# Patient Record
Sex: Male | Born: 1965
Health system: Northeastern US, Academic
[De-identification: ages and names within clinical notes are randomized; demographics above are authoritative.]

## PROBLEM LIST (undated history)

## (undated) DIAGNOSIS — T7840XA Allergy, unspecified, initial encounter: Secondary | ICD-10-CM

## (undated) HISTORY — PX: HERNIA REPAIR: SHX51

## (undated) HISTORY — DX: Allergy, unspecified, initial encounter: T78.40XA

---

## 2003-09-12 ENCOUNTER — Ambulatory Visit (HOSPITAL_COMMUNITY): Admission: RE | Admit: 2003-09-12 | Discharge: 2003-09-12 | Payer: Self-pay | Admitting: Surgery

## 2003-09-12 ENCOUNTER — Ambulatory Visit (HOSPITAL_BASED_OUTPATIENT_CLINIC_OR_DEPARTMENT_OTHER): Admission: RE | Admit: 2003-09-12 | Discharge: 2003-09-12 | Payer: Self-pay | Admitting: Surgery

## 2003-09-12 ENCOUNTER — Encounter (INDEPENDENT_AMBULATORY_CARE_PROVIDER_SITE_OTHER): Payer: Self-pay | Admitting: Specialist

## 2009-09-28 ENCOUNTER — Emergency Department (HOSPITAL_COMMUNITY): Admission: EM | Admit: 2009-09-28 | Discharge: 2009-09-28 | Payer: Self-pay | Admitting: Emergency Medicine

## 2010-09-26 NOTE — Op Note (Signed)
Thomas Santiago, Thomas Santiago                            ACCOUNT NO.:  [id number]   MEDICAL RECORD NO.:  [id number]                   PATIENT TYPE:  AMB   LOCATION:  [facility]                                 FACILITY:  [facility]   PHYSICIAN:  Thornton Park. Daphine Deutscher, M.D.             DATE OF BIRTH:  December 06, 1965   DATE OF PROCEDURE:  09/12/2003  DATE OF DISCHARGE:                                 OPERATIVE REPORT   PREOPERATIVE DIAGNOSIS:  Left inguinal hernia.   POSTOPERATIVE DIAGNOSIS:  Left indirect inguinal hernia.   PROCEDURE:  Left inguinal herniorrhaphy with mesh.   SURGEON:  Thornton Park. Daphine Deutscher, M.D.   ANESTHESIA:  General by LMA.   DESCRIPTION OF PROCEDURE:  Thomas Santiago is a 45 year old man, who had acute  pain and was seen by Dr. Robert Bellow at [facility] and was seen the  following day at our office and scheduled for a left inguinal hernia.  This  had gotten out, and he was reduced, but he had a lot of pain with it.  Informed consent was obtained in the office regarding repair of inguinal  hernia, including numbness, nerve pain, recurrent hernias.   The patient was taken to room 4 at [facility] and given general  anesthesia.  Received antibiotics preop.  The area was prepped with Betadine  and draped sterilely.  A small oblique incision was made in his left lower  quadrant.  He had a fairly generous amount of adiposity in his panniculus,  but I carried this down to the external oblique and incised those along the  fibers to the external ring.  I mobilized the cord structures and found a  large indirect hernia intimately associated with the cord structures along  with weakness of the floor.  I went ahead and separated this large sac from  the cord structures and went up high and excised the excess sac, and I  closed the sac with a running locking suture of 2-0 chromic.  In order to  completely mobilize this, I had to take down the fatty tissue that was stuck  to the inside of the sac,  and I did this and then tucked it all back inside.  No bleeding was noted, and there was no bowel impinged.  A running locking  suture was effected to close the indirect sac,and then this was tucked  inside.  I then repaired the floor with a piece of Marlex mesh, cut to fit,  sutured along the inguinal ligament with running 2-0 Prolene, sutured  medially with running 2-0 Prolene, and brought around the cord structures  and sutured to itself with a single 2-0 Prolene horizontal mattress suture.  The external oblique was then closed with a running 2-0 Vicryl, and  the area was infiltrated with 0.5% Marcaine.  Then 4-0 Vicryl was used in  the subcutaneous tissue, and then the skin was closed with subcuticular 4-0  Vicryl with Benzoin and Steri-Strips.  The patient seemed to tolerate the  procedure well and was taken to the recovery room in satisfactory condition.                                               Thornton Park Daphine Deutscher, M.D.    MBM/MEDQ  D:  09/12/2003  T:  09/12/2003  Job:  [id number]   cc:   Jonita Albee, M.D.  [facility]  [address]  [place]  [place] [address]  Fax: [phone]

## 2012-02-24 ENCOUNTER — Ambulatory Visit: Payer: Managed Care, Other (non HMO) | Admitting: Family Medicine

## 2012-02-24 VITALS — BP 139/89 | HR 62 | Temp 98.0°F | Resp 16 | Ht 67.0 in | Wt 191.0 lb

## 2012-02-24 DIAGNOSIS — Z23 Encounter for immunization: Secondary | ICD-10-CM

## 2012-02-24 DIAGNOSIS — Z0000 Encounter for general adult medical examination without abnormal findings: Secondary | ICD-10-CM

## 2012-02-24 MED ORDER — CIPROFLOXACIN HCL 250 MG PO TABS: 250.0000 mg | ORAL_TABLET | Freq: Two times a day (BID) | ORAL | Status: DC

## 2012-02-24 MED ORDER — INFLUENZA VIRUS VACC SPLIT PF IM SUSP: 0.5000 mL | INTRAMUSCULAR | Status: DC

## 2012-02-24 NOTE — Progress Notes (Signed)
Need flu vaccine  Also needs abx for trip to [place]

## 2012-02-24 NOTE — Patient Instructions (Signed)
Influenza Virus Trivalent Vaccine injection (Fluzone/FluLaval/Fluvirin/Agriflu) What is this medicine? INFLUENZA VIRUS VACCINE (in floo EN zuh VAHY ruhs vak SEEN) helps to reduce the risk of getting influenza also known as the flu. The vaccine only helps protect you against some strains of the flu. This medicine may be used for other purposes; ask your health care provider or pharmacist if you have questions. What should I tell my health care provider before I take this medicine? They need to know if you have any of these conditions: -bleeding disorder like hemophilia -fever or infection -Guillain-Barre syndrome or other neurological problems -immune system problems -infection with the human immunodeficiency virus (HIV) or AIDS -low blood platelet counts -multiple sclerosis -an unusual or allergic reaction to influenza virus vaccine, eggs, chicken proteins, thimerosal, other medicines, foods, dyes or preservatives -pregnant or trying to get pregnant -breast-feeding How should I use this medicine? This vaccine is for injection into a muscle or under the skin. It is given by a health care professional. A copy of Vaccine Information Statements will be given before each vaccination. Read this sheet carefully each time. The sheet may change frequently. Talk to your pediatrician regarding the use of this medicine in children. Special care may be needed. While some brands of this drug may be prescribed for children as young as 6 months of age for selected conditions, precautions do apply. Overdosage: If you think you have taken too much of this medicine contact a poison control center or emergency room at once. NOTE: This medicine is only for you. Do not share this medicine with others. What if I miss a dose? This does not apply. What may interact with this medicine? -chemotherapy or radiation therapy -medicines that lower your immune system like etanercept, anakinra, infliximab, and  adalimumab -medicines that treat or prevent blood clots like warfarin -phenytoin -steroid medicines like prednisone or cortisone -theophylline -vaccines This list may not describe all possible interactions. Give your health care provider a list of all the medicines, herbs, non-prescription drugs, or dietary supplements you use. Also tell them if you smoke, drink alcohol, or use illegal drugs. Some items may interact with your medicine. What should I watch for while using this medicine? Report any side effects that do not go away within 3 days to your doctor or health care professional. Call your health care provider if any unusual symptoms occur within 6 weeks of receiving this vaccine. You may still catch the flu, but the illness is not usually as bad. You cannot get the flu from the vaccine. The vaccine will not protect against colds or other illnesses that may cause fever. The vaccine is needed every year. What side effects may I notice from receiving this medicine? Side effects that you should report to your doctor or health care professional as soon as possible: -allergic reactions like skin rash, itching or hives, swelling of the face, lips, or tongue Side effects that usually do not require medical attention (report to your doctor or health care professional if they continue or are bothersome): -fever -headache -muscle aches and pains -pain, tenderness, redness, or swelling at the injection site -tiredness This list may not describe all possible side effects. Call your doctor for medical advice about side effects. You may report side effects to FDA at 1-800-FDA-1088. Where should I keep my medicine? The vaccine will be given by a health care professional in a clinic, pharmacy, doctor's office, or other health care setting. You will not be given vaccine doses to store   at home. NOTE: This sheet is a summary. It may not cover all possible information. If you have questions about this  medicine, talk to your doctor, pharmacist, or health care provider.  2012, Elsevier/Gold Standard. (09/25/2009 10:47:37 AM) 

## 2012-08-25 ENCOUNTER — Ambulatory Visit: Payer: Managed Care, Other (non HMO)

## 2012-12-14 ENCOUNTER — Ambulatory Visit (INDEPENDENT_AMBULATORY_CARE_PROVIDER_SITE_OTHER): Payer: Managed Care, Other (non HMO) | Admitting: Physician Assistant

## 2012-12-14 ENCOUNTER — Encounter: Payer: Self-pay | Admitting: Physician Assistant

## 2012-12-14 VITALS — BP 110/84 | HR 77 | Temp 98.7°F | Resp 16 | Ht 66.0 in | Wt 196.0 lb

## 2012-12-14 DIAGNOSIS — Z0000 Encounter for general adult medical examination without abnormal findings: Secondary | ICD-10-CM

## 2012-12-14 LAB — POCT URINALYSIS DIPSTICK
Ketones, UA: NEGATIVE
Leukocytes, UA: NEGATIVE
Urobilinogen, UA: 0.2
pH, UA: 6.5

## 2012-12-14 LAB — CBC
HCT: 43.4 % (ref 39.0–52.0)
MCV: 87.1 fL (ref 78.0–100.0)
RBC: 4.98 MIL/uL (ref 4.22–5.81)
RDW: 13.4 % (ref 11.5–15.5)
WBC: 6.1 10*3/uL (ref 4.0–10.5)

## 2012-12-14 LAB — COMPREHENSIVE METABOLIC PANEL
ALT: 25 U/L (ref 0–53)
Albumin: 4.3 g/dL (ref 3.5–5.2)
Alkaline Phosphatase: 61 U/L (ref 39–117)
Calcium: 9.6 mg/dL (ref 8.4–10.5)
Sodium: 141 mEq/L (ref 135–145)

## 2012-12-14 LAB — LIPID PANEL
Cholesterol: 199 mg/dL (ref 0–200)
HDL: 41 mg/dL (ref >39)
LDL Cholesterol: 131 mg/dL — ABNORMAL HIGH (ref 0–99)
Total CHOL/HDL Ratio: 4.9 Ratio
VLDL: 27 mg/dL (ref 0–40)

## 2012-12-14 NOTE — Progress Notes (Signed)
   [address] [place] [address]   Phone [phone]  Subjective:    Patient ID: Thomas Santiago, male    DOB: [birth year], 47 y.o.   MRN: [id number]  HPI Pt presents to clinic for CPE.  He is healthy.  He has intermittent pain in his L shoulder when he tries to reach at extreme ROM, like when he is trying to clean or scratch his back.  Pt is having trouble with reading close up. He last went to the eye doctor about 4 years ago. He does not go to the dentist.  Review of Systems  Constitutional: Negative.   HENT: Negative.   Eyes: Negative.   Respiratory: Negative.   Cardiovascular: Negative.   Gastrointestinal: Negative.   Endocrine: Negative.   Genitourinary: Negative.   Musculoskeletal: Negative.   Skin: Negative.   Allergic/Immunologic: Positive for environmental allergies (soring allergies).  Neurological: Negative.   Hematological: Negative.   Psychiatric/Behavioral: Negative.        Objective:   Physical Exam  Vitals reviewed. Constitutional: He is oriented to person, place, and time. He appears well-developed and well-nourished.  HENT:  Head: Normocephalic and atraumatic.  Right Ear: Hearing, tympanic membrane, external ear and ear canal normal.  Left Ear: Hearing, tympanic membrane, external ear and ear canal normal.  Nose: Mucosal edema (mild erythema) present.  Mouth/Throat: Uvula is midline, oropharynx is clear and moist and mucous membranes are normal.  Eyes: Conjunctivae and EOM are normal. Pupils are equal, round, and reactive to light.  Neck: Normal range of motion. Neck supple. No thyromegaly present.  Cardiovascular: Normal rate, regular rhythm and normal heart sounds.   No murmur heard. Pulmonary/Chest: Effort normal and breath sounds normal.  Abdominal: Soft. Bowel sounds are normal.  Musculoskeletal: Normal range of motion.  Muscle spasm in his trap on the L side.  Lymphadenopathy:    He has no cervical adenopathy.  Neurological: He is alert and  oriented to person, place, and time. He has normal reflexes.  Skin: Skin is warm and dry.  L hernia scar present.  Psychiatric: He has a normal mood and affect. His behavior is normal. Judgment and thought content normal.        Assessment & Plan:  PE (physical exam), annual - Plan: CBC, Comprehensive metabolic panel, Lipid panel, TSH, POCT urinalysis dipstick  Suggested to patient to schedule an eye exam for his presbyopia.  If all his labs are normal we could see him in 2 years.  Pt will work on weight loss and healthy lifestyle choices.    Benny Lennert PA-C 12/14/2012 1:26 PM

## 2012-12-15 LAB — TSH: TSH: 1.213 u[IU]/mL (ref 0.350–4.500)

## 2016-09-11 ENCOUNTER — Inpatient Hospital Stay (HOSPITAL_COMMUNITY)
Admission: EM | Admit: 2016-09-11 | Discharge: 2016-09-14 | DRG: 378 | Disposition: A | Discharge status: Home / Self Care | Payer: Managed Care, Other (non HMO) | Attending: Internal Medicine | Admitting: Internal Medicine

## 2016-09-11 ENCOUNTER — Ambulatory Visit (INDEPENDENT_AMBULATORY_CARE_PROVIDER_SITE_OTHER): Payer: Managed Care, Other (non HMO) | Admitting: Physician Assistant

## 2016-09-11 ENCOUNTER — Encounter (HOSPITAL_COMMUNITY): Payer: Self-pay | Admitting: Emergency Medicine

## 2016-09-11 VITALS — Temp 97.9°F | Resp 16

## 2016-09-11 DIAGNOSIS — K625 Hemorrhage of anus and rectum: Secondary | ICD-10-CM | POA: Diagnosis not present

## 2016-09-11 DIAGNOSIS — E669 Obesity, unspecified: Secondary | ICD-10-CM | POA: Diagnosis present

## 2016-09-11 DIAGNOSIS — Y929 Unspecified place or not applicable: Secondary | ICD-10-CM

## 2016-09-11 DIAGNOSIS — R42 Dizziness and giddiness: Secondary | ICD-10-CM

## 2016-09-11 DIAGNOSIS — Z9889 Other specified postprocedural states: Secondary | ICD-10-CM

## 2016-09-11 DIAGNOSIS — Z6832 Body mass index (BMI) 32.0-32.9, adult: Secondary | ICD-10-CM

## 2016-09-11 DIAGNOSIS — D62 Acute posthemorrhagic anemia: Secondary | ICD-10-CM | POA: Diagnosis present

## 2016-09-11 DIAGNOSIS — K254 Chronic or unspecified gastric ulcer with hemorrhage: Principal | ICD-10-CM | POA: Diagnosis present

## 2016-09-11 DIAGNOSIS — T39315A Adverse effect of propionic acid derivatives, initial encounter: Secondary | ICD-10-CM | POA: Diagnosis present

## 2016-09-11 DIAGNOSIS — K921 Melena: Secondary | ICD-10-CM

## 2016-09-11 DIAGNOSIS — D649 Anemia, unspecified: Secondary | ICD-10-CM

## 2016-09-11 DIAGNOSIS — Z79899 Other long term (current) drug therapy: Secondary | ICD-10-CM

## 2016-09-11 DIAGNOSIS — B9681 Helicobacter pylori [H. pylori] as the cause of diseases classified elsewhere: Secondary | ICD-10-CM | POA: Diagnosis present

## 2016-09-11 DIAGNOSIS — Z791 Long term (current) use of non-steroidal anti-inflammatories (NSAID): Secondary | ICD-10-CM

## 2016-09-11 DIAGNOSIS — K922 Gastrointestinal hemorrhage, unspecified: Secondary | ICD-10-CM

## 2016-09-11 LAB — CBC WITH DIFFERENTIAL/PLATELET
BASOS PCT: 0 %
Basophils Absolute: 0 10*3/uL (ref 0.0–0.1)
EOS ABS: 0.1 10*3/uL (ref 0.0–0.7)
EOS PCT: 1 %
HEMATOCRIT: 30.2 % — AB (ref 39.0–52.0)
Hemoglobin: 9.9 g/dL — ABNORMAL LOW (ref 13.0–17.0)
Lymphocytes Relative: 19 %
Lymphs Abs: 2.1 10*3/uL (ref 0.7–4.0)
MCH: 29.8 pg (ref 26.0–34.0)
MCHC: 32.8 g/dL (ref 30.0–36.0)
MCV: 91 fL (ref 78.0–100.0)
MONO ABS: 0.6 10*3/uL (ref 0.1–1.0)
MONOS PCT: 5 %
NEUTROS ABS: 8.2 10*3/uL — AB (ref 1.7–7.7)
Neutrophils Relative %: 75 %
PLATELETS: 220 10*3/uL (ref 150–400)
RBC: 3.32 MIL/uL — ABNORMAL LOW (ref 4.22–5.81)
RDW: 13 % (ref 11.5–15.5)
WBC: 11 10*3/uL — ABNORMAL HIGH (ref 4.0–10.5)

## 2016-09-11 LAB — POCT CBC
GRANULOCYTE PERCENT: 74.1 % (ref 37–80)
HEMATOCRIT: 29.5 % — AB (ref 43.5–53.7)
HEMOGLOBIN: 10.1 g/dL — AB (ref 14.1–18.1)
Lymph, poc: 2 (ref 0.6–3.4)
MCH: 30.3 pg (ref 27–31.2)
MCHC: 34 g/dL (ref 31.8–35.4)
MCV: 89.1 fL (ref 80–97)
MID (cbc): 0.5 (ref 0–0.9)
MPV: 7.7 fL (ref 0–99.8)
POC GRANULOCYTE: 7.3 — AB (ref 2–6.9)
POC LYMPH PERCENT: 20.4 %L (ref 10–50)
POC MID %: 5.5 % (ref 0–12)
Platelet Count, POC: 213 10*3/uL (ref 142–424)
RBC: 3.32 M/uL — AB (ref 4.69–6.13)
RDW, POC: 13.5 %
WBC: 9.9 10*3/uL (ref 4.6–10.2)

## 2016-09-11 LAB — POCT URINALYSIS DIP (MANUAL ENTRY)
BILIRUBIN UA: NEGATIVE
Blood, UA: NEGATIVE
GLUCOSE UA: NEGATIVE mg/dL
Ketones, POC UA: NEGATIVE mg/dL
Leukocytes, UA: NEGATIVE
NITRITE UA: NEGATIVE
Protein Ur, POC: NEGATIVE mg/dL
Spec Grav, UA: 1.02 (ref 1.010–1.025)
UROBILINOGEN UA: 0.2 U/dL
pH, UA: 5.5 (ref 5.0–8.0)

## 2016-09-11 LAB — COMPREHENSIVE METABOLIC PANEL
ALBUMIN: 3.4 g/dL — AB (ref 3.5–5.0)
ALT: 17 U/L (ref 17–63)
ANION GAP: 8 (ref 5–15)
AST: 19 U/L (ref 15–41)
Alkaline Phosphatase: 34 U/L — ABNORMAL LOW (ref 38–126)
BILIRUBIN TOTAL: 0.7 mg/dL (ref 0.3–1.2)
BUN: 26 mg/dL — ABNORMAL HIGH (ref 6–20)
CHLORIDE: 106 mmol/L (ref 101–111)
CO2: 24 mmol/L (ref 22–32)
Calcium: 8.3 mg/dL — ABNORMAL LOW (ref 8.9–10.3)
Creatinine, Ser: 0.64 mg/dL (ref 0.61–1.24)
GFR calc Af Amer: >60 mL/min (ref >60)
GFR calc non Af Amer: >60 mL/min (ref >60)
GLUCOSE: 120 mg/dL — AB (ref 65–99)
POTASSIUM: 4.3 mmol/L (ref 3.5–5.1)
Sodium: 138 mmol/L (ref 135–145)
TOTAL PROTEIN: 6 g/dL — AB (ref 6.5–8.1)

## 2016-09-11 LAB — FERRITIN: Ferritin: 155 ng/mL (ref 24–336)

## 2016-09-11 LAB — RETICULOCYTES
RBC.: 3.29 MIL/uL — AB (ref 4.22–5.81)
RETIC CT PCT: 2.9 % (ref 0.4–3.1)
Retic Count, Absolute: 95.4 10*3/uL (ref 19.0–186.0)

## 2016-09-11 LAB — PROTIME-INR
INR: 1.03
PROTHROMBIN TIME: 13.5 s (ref 11.4–15.2)

## 2016-09-11 LAB — IRON AND TIBC
Iron: 90 ug/dL (ref 45–182)
SATURATION RATIOS: 35 % (ref 17.9–39.5)
TIBC: 259 ug/dL (ref 250–450)
UIBC: 169 ug/dL

## 2016-09-11 LAB — POC OCCULT BLOOD, ED
Fecal Occult Bld: POSITIVE — AB
Fecal Occult Bld: POSITIVE — AB

## 2016-09-11 LAB — IFOBT (OCCULT BLOOD): IFOBT: POSITIVE

## 2016-09-11 LAB — ABO/RH: ABO/RH(D): O POS

## 2016-09-11 LAB — LIPASE, BLOOD: Lipase: 28 U/L (ref 11–51)

## 2016-09-11 LAB — FOLATE: Folate: 17.1 ng/mL (ref >5.9)

## 2016-09-11 LAB — VITAMIN B12: VITAMIN B 12: 389 pg/mL (ref 180–914)

## 2016-09-11 LAB — I-STAT CG4 LACTIC ACID, ED: LACTIC ACID, VENOUS: 1.86 mmol/L (ref 0.5–1.9)

## 2016-09-11 MED ORDER — ACETAMINOPHEN 325 MG PO TABS: 650.0000 mg | ORAL_TABLET | Freq: Four times a day (QID) | ORAL | Status: DC | PRN

## 2016-09-11 MED ORDER — SODIUM CHLORIDE 0.9 % IV SOLN
INTRAVENOUS | Status: DC
  Administered 2016-09-11 – 2016-09-13 (×5): via INTRAVENOUS

## 2016-09-11 MED ORDER — ONDANSETRON HCL 4 MG PO TABS: 4.0000 mg | ORAL_TABLET | Freq: Four times a day (QID) | ORAL | Status: DC | PRN

## 2016-09-11 MED ORDER — SODIUM CHLORIDE 0.9 % IV SOLN
8.0000 mg/h | INTRAVENOUS | Status: DC
  Administered 2016-09-11 – 2016-09-12 (×2): 8 mg/h via INTRAVENOUS
  Filled 2016-09-11 (×5): qty 80

## 2016-09-11 MED ORDER — SENNOSIDES-DOCUSATE SODIUM 8.6-50 MG PO TABS: 1.0000 | ORAL_TABLET | Freq: Every evening | ORAL | Status: DC | PRN

## 2016-09-11 MED ORDER — SODIUM CHLORIDE 0.9 % IV BOLUS (SEPSIS)
1000.0000 mL | Freq: Once | INTRAVENOUS | Status: AC
  Administered 2016-09-11: 1000 mL via INTRAVENOUS

## 2016-09-11 MED ORDER — LORATADINE 10 MG PO TABS
10.0000 mg | ORAL_TABLET | Freq: Every day | ORAL | Status: DC
  Administered 2016-09-12 – 2016-09-14 (×3): 10 mg via ORAL
  Filled 2016-09-11 (×2): qty 1

## 2016-09-11 MED ORDER — ONDANSETRON HCL 4 MG/2ML IJ SOLN
4.0000 mg | Freq: Four times a day (QID) | INTRAMUSCULAR | Status: DC | PRN
Start: 2016-09-11 — End: 2016-09-14

## 2016-09-11 MED ORDER — MECLIZINE HCL 12.5 MG PO TABS
12.5000 mg | ORAL_TABLET | Freq: Two times a day (BID) | ORAL | Status: DC | PRN
  Filled 2016-09-11: qty 1

## 2016-09-11 MED ORDER — TRAZODONE HCL 50 MG PO TABS: 25.0000 mg | ORAL_TABLET | Freq: Every evening | ORAL | Status: DC | PRN

## 2016-09-11 MED ORDER — ACETAMINOPHEN 650 MG RE SUPP: 650.0000 mg | Freq: Four times a day (QID) | RECTAL | Status: DC | PRN

## 2016-09-11 MED ORDER — BISACODYL 10 MG RE SUPP: 10.0000 mg | Freq: Every day | RECTAL | Status: DC | PRN

## 2016-09-11 NOTE — H&P (Signed)
History and Physical    Thomas Santiago WUJ:[id number] DOB: December 22, 1965 DOA: 09/11/2016   PCP: No PCP Per Patient   Patient coming from:  Home    Chief Complaint: Dizziness and dark stools  HPI: Thomas Santiago is a 51 y.o. [place] male with a history of allergies, otherwise healthy, presenting with 2 day history of dizziness worse with standing up, not at rest, without vertigo, accompanied by 5 day history of dark stools, 2 episodes a day, not worsening. Symptoms were preceded by "indigestion" last Sunday, after eating [employer]. He reports that he does take Advil daily for the last 3 weeks for "allergies". Denies abdominal pain, nausea, vomiting or diarrhea.  No prior similar episodes, Denies any sick contacts. Denies any tobacco, ETOH or recreational drugs. Denies history of Hepatitis or HIV. Denies fevers, chills, night sweats, vision changes, or mucositis. Denies any respiratory complaints. Denies any chest pain or palpitations. Denies lower extremity swelling. Denies any dysuria. Denies abnormal skin rashes, or neuropathy. Denies any bleeding issues such as epistaxis, hematemesis, hematuria or hematochezia. Denies Pica. He never had EGD . Last colonoscopy in 2015 negative. No recent long distant travels. No family history of GI cancers. No recent activity changes or trauma.,   ED Course:  BP (!) 124/94   Pulse 97   Temp 98.3 F (36.8 C) (Oral)   Resp 14   SpO2 95%    hemoglobin 9.9 (10.1 today )Was 14.7 in 2014  BUN 26 WBC 9->11 Lipase 28  PT INR heme occult + lactic acid 1.86 received Protonix IV, one little of IV bolus normal saline  GI to consult today.   Review of Systems: As per HPI otherwise 10 point review of systems negative.   Past Medical History:  Diagnosis Date  . Allergy     Past Surgical History:  Procedure Laterality Date  . HERNIA REPAIR     L side 2005    Social History Social History   Social History  . Marital status: Single    Spouse name: N/A    . Number of children: N/A  . Years of education: N/A   Occupational History  . Event organiser    Social History Main Topics  . Smoking status: Never Smoker  . Smokeless tobacco: Never Used  . Alcohol use No  . Drug use: No  . Sexual activity: Yes    Partners: Female   Other Topics Concern  . Not on file   Social History Narrative   Married with 2 children.  From [place] - [place] since 1998     No Known Allergies  No family history on file.    Prior to Admission medications   Medication Sig Start Date End Date Taking? Authorizing Provider  fexofenadine (ALLEGRA) 30 MG tablet Take 30 mg by mouth 2 (two) times daily.    Historical Provider, MD    Physical Exam:  Vitals:   09/11/16 1315 09/11/16 1345 09/11/16 1400 09/11/16 1415  BP: 138/80 118/73 120/77 (!) 124/94  Pulse: (!) 102 94 91 97  Resp: 16 16 13 14   Temp:      TempSrc:      SpO2: 100% 99% 98% 95%   Constitutional: NAD, calm, comfortable   Eyes: PERRL, lids and conjunctivae normal ENMT: Mucous membranes are moist, without exudate or lesions  Neck: normal, supple, no masses, no thyromegaly Respiratory: clear to auscultation bilaterally, no wheezing, no crackles. Normal respiratory effort  Cardiovascular: Regular rate and rhythm, no murmurs, rubs or  gallops. No extremity edema. 2+ pedal pulses. No carotid bruits.  Abdomen: Soft, non tender, No hepatosplenomegaly. Bowel sounds positive.  Musculoskeletal: no clubbing / cyanosis. Moves all extremities Skin: no jaundice, No lesions.  Neurologic: Sensation intact  Strength equal in all extremities Psychiatric:   Alert and oriented x 3. Normal mood.     Labs on Admission: I have personally reviewed following labs and imaging studies  CBC:  Recent Labs Lab 09/11/16 1006 09/11/16 1152  WBC 9.9 11.0*  NEUTROABS  --  8.2*  HGB 10.1* 9.9*  HCT 29.5* 30.2*  MCV 89.1 91.0  PLT  --  220    Basic Metabolic Panel:  Recent Labs Lab 09/11/16 1152  NA  138  K 4.3  CL 106  CO2 24  GLUCOSE 120*  BUN 26*  CREATININE 0.64  CALCIUM 8.3*    GFR: CrCl cannot be calculated (Unknown ideal weight.).  Liver Function Tests:  Recent Labs Lab 09/11/16 1152  AST 19  ALT 17  ALKPHOS 34*  BILITOT 0.7  PROT 6.0*  ALBUMIN 3.4*    Recent Labs Lab 09/11/16 1152  LIPASE 28   No results for input(s): AMMONIA in the last 168 hours.  Coagulation Profile:  Recent Labs Lab 09/11/16 1152  INR 1.03    Cardiac Enzymes: No results for input(s): CKTOTAL, CKMB, CKMBINDEX, TROPONINI in the last 168 hours.  BNP (last 3 results) No results for input(s): PROBNP in the last 8760 hours.  HbA1C: No results for input(s): HGBA1C in the last 72 hours.  CBG: No results for input(s): GLUCAP in the last 168 hours.  Lipid Profile: No results for input(s): CHOL, HDL, LDLCALC, TRIG, CHOLHDL, LDLDIRECT in the last 72 hours.  Thyroid Function Tests: No results for input(s): TSH, T4TOTAL, FREET4, T3FREE, THYROIDAB in the last 72 hours.  Anemia Panel: No results for input(s): VITAMINB12, FOLATE, FERRITIN, TIBC, IRON, RETICCTPCT in the last 72 hours.  Urine analysis:    Component Value Date/Time   BILIRUBINUR negative 09/11/2016 1029   BILIRUBINUR neg 12/14/2012 1607   KETONESUR negative 09/11/2016 1029   PROTEINUR negative 09/11/2016 1029   PROTEINUR neg 12/14/2012 1607   UROBILINOGEN 0.2 09/11/2016 1029   NITRITE Negative 09/11/2016 1029   NITRITE neg 12/14/2012 1607   LEUKOCYTESUR Negative 09/11/2016 1029    Sepsis Labs: @LABRCNTIP (procalcitonin:4,lacticidven:4) )No results found for this or any previous visit (from the past 240 hour(s)).   Radiological Exams on Admission: No results found.  EKG: Independently reviewed.  Assessment/Plan Active Problems:   * No active hospital problems. *    Gastro Intestinal Bleed  Likely higher  Etiology is unknown at this time, NSAIDs versus food related. Never had EGD. No fam hx of GI  cancers. IV Protonix  And IVF given at the ED.  current Hgb is 10->9  BUN is 26 Place on  observation medsurg    IV Protonix as per GI  IVF at 100 cc/h  NPO for now Dr. Dulce Sellar [facility] has been consulted. Hold NSAIDs Check H pylori   Dizziness in the setting of GIB, symptomatic anemia, likely orthostatic : Hemodynamically stable at this time. No vertigo noted. No Neuro changes. Denies CP or palpitations. EKG NSR Received 1 L IV NS.   - Telemetry, observation - Meclizine when necessary dizziness - IVF  - Neurology consult if not improving in a.m. - orthostatics  Anemia, symptomatic, likely due to GIB current Hgb is 10->9  BUN is 26. Hcult + MCV normal at 91 Will type  and screen, check serial CBCs, and transfuse for Hgb less than 8 .  Repeat CBC in am     DVT prophylaxis: SCD's   Code Status:   Full    Family Communication:  Discussed with patient Disposition Plan: Expect patient to be discharged to home after condition improves Consults called:   GI   Admission status: Medsurg Obs  Wolf Boulay E, PA-C [facility]   09/11/2016, 2:31 PM

## 2016-09-11 NOTE — ED Provider Notes (Signed)
[facility]-EMERGENCY DEPT Provider Note   CSN: [id number] Arrival date & time: 09/11/16  1123     History   Chief Complaint Chief Complaint  Patient presents with  . Blood In Stools    HPI Thomas Santiago is a 51 y.o. male.  HPI Patient presents with concern of lightheadedness, black stool. Patient states that he was generally well until a few days ago. About that time he noticed black stool, since then has had the same. He denies bright red blood per rectum per He denies diarrhea, vomiting. During this illness he has had increasing generalized weakness, as well as new lightheadedness, particularly with upright positioning and exertion.  He denies recent medication changes, activity changes, does recall one prior possible food precipitant.  Past Medical History:  Diagnosis Date  . Allergy     There are no active problems to display for this patient.   Past Surgical History:  Procedure Laterality Date  . HERNIA REPAIR     L side 2005       Home Medications    Prior to Admission medications   Medication Sig Start Date End Date Taking? Authorizing Provider  fexofenadine (ALLEGRA) 30 MG tablet Take 30 mg by mouth 2 (two) times daily.    Historical Provider, MD    Family History No family history on file.  Social History Social History  Substance Use Topics  . Smoking status: Never Smoker  . Smokeless tobacco: Never Used  . Alcohol use No     Allergies   Patient has no known allergies.   Review of Systems Review of Systems  Constitutional:       Per HPI, otherwise negative  HENT:       Per HPI, otherwise negative  Respiratory:       Per HPI, otherwise negative  Cardiovascular:       Per HPI, otherwise negative  Gastrointestinal: Positive for nausea. Negative for vomiting.  Endocrine:       Negative aside from HPI  Genitourinary:       Neg aside from HPI   Musculoskeletal:       Per HPI, otherwise negative  Skin: Negative.   Neurological: Positive  for light-headedness. Negative for syncope.     Physical Exam Updated Vital Signs BP 129/81   Pulse (!) 102   Temp 98.3 F (36.8 C) (Oral)   Resp 19   SpO2 100%   Physical Exam  Constitutional: He is oriented to person, place, and time. He appears well-developed. No distress.  HENT:  Head: Normocephalic and atraumatic.  Eyes: Conjunctivae and EOM are normal.  Cardiovascular: Normal rate and regular rhythm.   Pulmonary/Chest: Effort normal. No stridor. No respiratory distress.  Abdominal: He exhibits no distension.  Genitourinary: Rectal exam shows guaiac positive stool. Rectal exam shows no tenderness.  Musculoskeletal: He exhibits no edema.  Neurological: He is alert and oriented to person, place, and time.  Skin: Skin is warm and dry.  Psychiatric: He has a normal mood and affect.  Nursing note and vitals reviewed.    ED Treatments / Results  Labs (all labs ordered are listed, but only abnormal results are displayed) Labs Reviewed  CBC WITH DIFFERENTIAL/PLATELET - Abnormal; Notable for the following:       Result Value   WBC 11.0 (*)    RBC 3.32 (*)    Hemoglobin 9.9 (*)    HCT 30.2 (*)    Neutro Abs 8.2 (*)    All other components within normal  limits  COMPREHENSIVE METABOLIC PANEL - Abnormal; Notable for the following:    Glucose, Bld 120 (*)    BUN 26 (*)    Calcium 8.3 (*)    Total Protein 6.0 (*)    Albumin 3.4 (*)    Alkaline Phosphatase 34 (*)    All other components within normal limits  POC OCCULT BLOOD, ED - Abnormal; Notable for the following:    Fecal Occult Bld POSITIVE (*)    All other components within normal limits  POC OCCULT BLOOD, ED - Abnormal; Notable for the following:    Fecal Occult Bld POSITIVE (*)    All other components within normal limits  LIPASE, BLOOD  PROTIME-INR  OCCULT BLOOD X 1 CARD TO LAB, STOOL  I-STAT CG4 LACTIC ACID, ED  TYPE AND SCREEN  ABO/RH   Labs notable for substantial hemoglobin drop.  He is also hemoccult  positive on stool exam.   Procedures Procedures (including critical care time)  Medications Ordered in ED Medications  pantoprazole (PROTONIX) 80 mg in sodium chloride 0.9 % 250 mL (0.32 mg/mL) infusion (not administered)  sodium chloride 0.9 % bolus 1,000 mL (not administered)    And  0.9 %  sodium chloride infusion (not administered)     Initial Impression / Assessment and Plan / ED Course  I have reviewed the triage vital signs and the nursing notes.  Pertinent labs & imaging results that were available during my care of the patient were reviewed by me and considered in my medical decision making (see chart for details).  2:39 PM Patient awake and alert. Patient has received fluid resuscitation, IV Protonix, blood pressure is stable. I have discussed patient's case with our gastroenterology colleagues for consultation given the patient's active GI bleed, substantial drop in hemoglobin.  I also discussed patient's case with our hospitalist colleagues for admission. This patient presents with concern of black stool, weakness, lightheadedness, is found to have Hemoccult-positive feces, as well as substantial drop in his hemoglobin, concerning for ongoing GI bleed. Patient received fluid resuscitation with improvement in his heart rate, had no additional episodes of black stool in the emergency department. With his concerning findings and discuss case with our GI colleagues, who will evaluate the patient. Patient requires admission for further evaluation and management.    Final Clinical Impressions(s) / ED Diagnoses  GI bleed Symptomatic anemia   Gerhard Munchobert Darice Vicario, MD 09/11/16 1450

## 2016-09-11 NOTE — Consult Note (Signed)
[facility] Consultation Note  Referring Provider:  Dr. Brett Canalesheresa Sheehan [facility] Primary Care Physician:  Patient, No Pcp Per  Reason for Consultation:  Melena, anemia  HPI: Thomas Santiago is a 51 y.o. male presenting with melena.  History of allergies and sinus headaches, over the past few weeks been taking couple Advil liqui-gels per day.  Few day history of melena.  No abdominal pain, hematemesis, hematochezia.  No prior GI bleeding.  No prior endoscopy or colonoscopy.  Some weakness and fatigue.  No weight loss.   Past Medical History:  Diagnosis Date  . Allergy     Past Surgical History:  Procedure Laterality Date  . HERNIA REPAIR     L side 2005    Prior to Admission medications   Medication Sig Start Date End Date Taking? Authorizing Provider  fexofenadine (ALLEGRA) 30 MG tablet Take 30 mg by mouth 2 (two) times daily.   Yes [provider]    Current Facility-Administered Medications  Medication Dose Route Frequency Provider Last Rate Last Dose  . 0.9 %  sodium chloride infusion   Intravenous Continuous Marcos EkeWertman, Sara E, PA-C      . acetaminophen (TYLENOL) tablet 650 mg  650 mg Oral Q6H PRN Marcos EkeWertman, Sara E, PA-C       Or  . acetaminophen (TYLENOL) suppository 650 mg  650 mg Rectal Q6H PRN Marcos EkeWertman, Sara E, PA-C      . bisacodyl (DULCOLAX) suppository 10 mg  10 mg Rectal Daily PRN Marcos EkeWertman, Sara E, PA-C      . [START ON 09/12/2016] loratadine (CLARITIN) tablet 10 mg  10 mg Oral Daily Wertman, Sara E, PA-C      . meclizine (ANTIVERT) tablet 12.5 mg  12.5 mg Oral BID PRN Marcos EkeWertman, Sara E, PA-C      . ondansetron (ZOFRAN) tablet 4 mg  4 mg Oral Q6H PRN Marcos EkeWertman, Sara E, PA-C       Or  . ondansetron (ZOFRAN) injection 4 mg  4 mg Intravenous Q6H PRN Marcos EkeWertman, Sara E, PA-C      . pantoprazole (PROTONIX) 80 mg in sodium chloride 0.9 % 250 mL (0.32 mg/mL) infusion  8 mg/hr Intravenous Continuous Gerhard MunchLockwood, Robert, MD 25 mL/hr at 09/11/16 1251 8 mg/hr at 09/11/16 1251  .  senna-docusate (Senokot-S) tablet 1 tablet  1 tablet Oral QHS PRN Marcos EkeWertman, Sara E, PA-C        Allergies as of 09/11/2016  . (No Known Allergies)    History reviewed. No pertinent family history.  Social History   Social History  . Marital status: Single    Spouse name: N/A  . Number of children: N/A  . Years of education: N/A   Occupational History  . Event organiserautomatic mechanic    Social History Main Topics  . Smoking status: Never Smoker  . Smokeless tobacco: Never Used  . Alcohol use No  . Drug use: No  . Sexual activity: Yes    Partners: Female   Other Topics Concern  . Not on file   Social History Narrative   Married with 2 children.  From [place]    Review of Systems: Positive = bold Gen: Denies any fever, chills, rigors, night sweats, anorexia, fatigue, weakness, malaise, involuntary weight loss, and sleep disorder CV: Denies chest pain, angina, palpitations, syncope, orthopnea, PND, peripheral edema, and claudication. Resp: Denies dyspnea, cough, sputum, wheezing, coughing up blood. GI: Described in detail in HPI.    GU : Denies urinary burning, blood  in urine, urinary frequency, urinary hesitancy, nocturnal urination, and urinary incontinence. MS: Denies joint pain or swelling.  Denies muscle weakness, cramps, atrophy.  Derm: Denies rash, itching, oral ulcerations, hives, unhealing ulcers.  Psych: Denies depression, anxiety, memory loss, suicidal ideation, hallucinations,  and confusion. Heme: Denies bruising, bleeding, and enlarged lymph nodes. Neuro:  Denies any headaches, dizziness, paresthesias. Endo:  Denies any problems with DM, thyroid, adrenal function.  Physical Exam: Vital signs in last 24 hours: Temp:  [97.9 F (36.6 C)-98.3 F (36.8 C)] 98.3 F (36.8 C) (05/04 1132) Pulse Rate:  [91-107] 99 (05/04 1515) Resp:  [13-27] 14 (05/04 1515) BP: (111-151)/(66-103) 125/83 (05/04 1515) SpO2:  [95 %-100 %] 99 % (05/04 1515)   General:    Alert,  Well-developed, well-nourished, pleasant and cooperative in NAD Head:  Normocephalic and atraumatic. Eyes:  Sclera clear, no icterus.   Conjunctiva pale Ears:  Normal auditory acuity. Nose:  No deformity, discharge,  or lesions. Mouth:  No deformity or lesions.  Oropharynx pale and dry. Neck:  Supple; no masses or thyromegaly. Lungs:  Clear throughout to auscultation.   No wheezes, crackles, or rhonchi. No acute distress. Heart:  Regular rate and rhythm; no murmurs, clicks, rubs,  or gallops. Abdomen:  Soft, nontender and nondistended. No masses, hepatosplenomegaly or hernias noted. Normal bowel sounds, without guarding, and without rebound.     Msk:  Symmetrical without gross deformities. Normal posture. Pulses:  Normal pulses noted. Extremities:  Without clubbing or edema. Neurologic:  Alert and  oriented x4; diffusely weak, otherwise grossly normal neurologically. Skin:  Intact without significant lesions or rashes. Psych:  Alert and cooperative. Normal mood and affect.   Lab Results:  Recent Labs  09/11/16 1006 09/11/16 1152  WBC 9.9 11.0*  HGB 10.1* 9.9*  HCT 29.5* 30.2*  PLT  --  220   BMET  Recent Labs  09/11/16 1152  NA 138  K 4.3  CL 106  CO2 24  GLUCOSE 120*  BUN 26*  CREATININE 0.64  CALCIUM 8.3*   LFT  Recent Labs  09/11/16 1152  PROT 6.0*  ALBUMIN 3.4*  AST 19  ALT 17  ALKPHOS 34*  BILITOT 0.7   PT/INR  Recent Labs  09/11/16 1152  LABPROT 13.5  INR 1.03    Studies/Results: No results found.  Impression:  1.  Melena with elevated BUN.  NSAIDs.  Suspect peptic ulcer.  Hemodynamically stable. 2.  Acute blood loss anemia.  Plan:  1.  Clear liquid diet tonight. 2.  NPO after midnight. 3.  PPI. 4.  Endoscopy tomorrow. 5.  Risks (bleeding, infection, bowel perforation that could require surgery, sedation-related changes in cardiopulmonary systems), benefits (identification and possible treatment of source of symptoms,  exclusion of certain causes of symptoms), and alternatives (watchful waiting, radiographic imaging studies, empiric medical treatment) of upper endoscopy (EGD) were explained to patient/family in detail and patient wishes to proceed.   LOS: 0 days   Denisa Enterline M  09/11/2016, 4:43 PM  Pager [phone] If no answer or after 5 PM call [phone]

## 2016-09-11 NOTE — Patient Instructions (Signed)
     IF you received an x-ray today, you will receive an invoice from [facility]. Please contact [facility] at [phone] with questions or concerns regarding your invoice.   IF you received labwork today, you will receive an invoice from [facility]. Please contact [facility] at [phone] with questions or concerns regarding your invoice.   Our billing staff will not be able to assist you with questions regarding bills from these companies.  You will be contacted with the lab results as soon as they are available. The fastest way to get your results is to activate your My Chart account. Instructions are located on the last page of this paperwork. If you have not heard from us regarding the results in 2 weeks, please contact this office.     

## 2016-09-11 NOTE — Progress Notes (Signed)
09/11/2016 11:32 AM   DOB: [birth year] / MRN: [id number]  SUBJECTIVE:  Thomas Santiago is a 51 y.o. male presenting for dizziness that started 2 days ago.  Tells me this started with going from kneeling to standing while working on his car.  Denies loss of consciences, room spinning, chest pain, SOB.  Tells me he is urinating normally. Tells me he has noticed black stools for the past two days. Has been taking 2 Advil daily for the last 3 weeks.  Denies abdominal pain and nausea at this time.  He drinks on the weekend, roughly 1-2 beers and not more.   He has No Known Allergies.   He  has a past medical history of Allergy.    He  reports that he has never smoked. He has never used smokeless tobacco. He reports that he does not drink alcohol or use drugs. He  reports that he currently engages in sexual activity and has had male partners. The patient  has a past surgical history that includes Hernia repair.  His family history is not on file.  Review of Systems  Constitutional: Negative for fever.  Respiratory: Negative for shortness of breath.   Cardiovascular: Negative for chest pain.  Gastrointestinal: Positive for blood in stool. Negative for abdominal pain, constipation, diarrhea, heartburn, melena, nausea and vomiting.  Neurological: Negative for dizziness.    The problem list and medications were reviewed and updated by myself where necessary and exist elsewhere in the encounter.   OBJECTIVE:  Temp 97.9 F (36.6 C) (Oral)   Resp 16   SpO2 100%   Physical Exam  Constitutional: He appears well-developed and well-nourished. No distress.  Cardiovascular: Normal rate and regular rhythm.   Pulmonary/Chest: Effort normal and breath sounds normal.  Abdominal: He exhibits no distension and no mass. There is tenderness. There is no rebound and no guarding.  Skin: No rash noted. He is not diaphoretic. No erythema. There is pallor.  Psychiatric: He has a normal mood and affect.     Results for orders placed or performed in visit on 09/11/16 (from the past 72 hour(s))  POCT CBC     Status: Abnormal   Collection Time: 09/11/16 10:06 AM  Result Value Ref Range   WBC 9.9 4.6 - 10.2 K/uL   Lymph, poc 2.0 0.6 - 3.4   POC LYMPH PERCENT 20.4 10 - 50 %L   MID (cbc) 0.5 0 - 0.9   POC MID % 5.5 0 - 12 %M   POC Granulocyte 7.3 (A) 2 - 6.9   Granulocyte percent 74.1 37 - 80 %G   RBC 3.32 (A) 4.69 - 6.13 M/uL   Hemoglobin 10.1 (A) 14.1 - 18.1 g/dL   HCT, POC 52.829.5 (A) 41.343.5 - 53.7 %   MCV 89.1 80 - 97 fL   MCH, POC 30.3 27 - 31.2 pg   MCHC 34.0 31.8 - 35.4 g/dL   RDW, POC 24.413.5 %   Platelet Count, POC 213 142 - 424 K/uL   MPV 7.7 0 - 99.8 fL  POCT urinalysis dipstick     Status: None   Collection Time: 09/11/16 10:29 AM  Result Value Ref Range   Color, UA yellow yellow   Clarity, UA clear clear   Glucose, UA negative negative mg/dL   Bilirubin, UA negative negative   Ketones, POC UA negative negative mg/dL   Spec Grav, UA 0.1021.020 7.2531.010 - 1.025   Blood, UA negative negative   pH, UA 5.5 5.0 - 8.0  Protein Ur, POC negative negative mg/dL   Urobilinogen, UA 0.2 0.2 or 1.0 E.U./dL   Nitrite, UA Negative Negative   Leukocytes, UA Negative Negative  IFOBT POC (occult bld, rslt in office)     Status: None   Collection Time: 09/11/16 10:42 AM  Result Value Ref Range   IFOBT Positive     No results found.  Orthostatic VS for the past 24 hrs (Last 3 readings):  BP- Lying Pulse- Lying BP- Sitting Pulse- Sitting BP- Standing at 0 minutes Pulse- Standing at 0 minutes  09/11/16 0908 115/80 91 116/82 100 116/81 103     ASSESSMENT AND PLAN:  Mary was seen today for dizziness.  Diagnoses and all orders for this visit:  Dizziness: See problem 2.  -     Cancel: Lupus anticoagulant panel -     EKG 12-Lead -     POCT urinalysis dipstick -     Cancel: CBC -     POCT CBC  Blood in stooL: Blood in the vault.  Dizzy. Positive occult card.  Stable orthostatics. Case  discussed MD and we will get him to the ED.  IV started here and wide open.  -     IFOBT POC (occult bld, rslt in office); Future    The patient is advised to call or return to clinic if he does not see an improvement in symptoms, or to seek the care of the closest emergency department if he worsens with the above plan.   Deliah Boston, MHS, PA-C [facility] 09/11/2016 11:32 AM

## 2016-09-11 NOTE — ED Notes (Signed)
MD at bedside. 

## 2016-09-11 NOTE — ED Triage Notes (Signed)
Patient presents today with complaints of dark stools and dizziness x3 days. Patient reports he had 3 BM. Patient reports when he feel dizzy " I see stars and get sweaty". Patient denies any N/V on arrival. Patient alert and oriented x4 on arrival. Patient reports he does not take any medication daily. During orthostatics patient reports feeling dizzy upon standing.

## 2016-09-12 ENCOUNTER — Observation Stay (HOSPITAL_COMMUNITY): Payer: Managed Care, Other (non HMO)

## 2016-09-12 ENCOUNTER — Observation Stay (HOSPITAL_COMMUNITY): Payer: Managed Care, Other (non HMO) | Admitting: Anesthesiology

## 2016-09-12 ENCOUNTER — Encounter (HOSPITAL_COMMUNITY): Payer: Self-pay | Admitting: *Deleted

## 2016-09-12 ENCOUNTER — Encounter (HOSPITAL_COMMUNITY)
Admission: EM | Disposition: A | Discharge status: Home / Self Care | Payer: Self-pay | Source: Home / Self Care | Attending: Internal Medicine

## 2016-09-12 DIAGNOSIS — D649 Anemia, unspecified: Secondary | ICD-10-CM

## 2016-09-12 DIAGNOSIS — K921 Melena: Secondary | ICD-10-CM

## 2016-09-12 DIAGNOSIS — R42 Dizziness and giddiness: Secondary | ICD-10-CM | POA: Diagnosis not present

## 2016-09-12 HISTORY — PX: ESOPHAGOGASTRODUODENOSCOPY (EGD) WITH PROPOFOL: SHX5813

## 2016-09-12 LAB — CBC
HEMATOCRIT: 25.8 % — AB (ref 39.0–52.0)
Hemoglobin: 8.2 g/dL — ABNORMAL LOW (ref 13.0–17.0)
MCH: 29 pg (ref 26.0–34.0)
MCHC: 31.8 g/dL (ref 30.0–36.0)
MCV: 91.2 fL (ref 78.0–100.0)
Platelets: 204 10*3/uL (ref 150–400)
RBC: 2.83 MIL/uL — ABNORMAL LOW (ref 4.22–5.81)
RDW: 13 % (ref 11.5–15.5)
WBC: 7.6 10*3/uL (ref 4.0–10.5)

## 2016-09-12 LAB — COMPREHENSIVE METABOLIC PANEL
ALBUMIN: 2.8 g/dL — AB (ref 3.5–5.0)
ALK PHOS: 33 U/L — AB (ref 38–126)
ALT: 18 U/L (ref 17–63)
AST: 17 U/L (ref 15–41)
Anion gap: 7 (ref 5–15)
BILIRUBIN TOTAL: 0.8 mg/dL (ref 0.3–1.2)
BUN: 15 mg/dL (ref 6–20)
CO2: 27 mmol/L (ref 22–32)
CREATININE: 0.77 mg/dL (ref 0.61–1.24)
Calcium: 8.1 mg/dL — ABNORMAL LOW (ref 8.9–10.3)
Chloride: 104 mmol/L (ref 101–111)
GFR calc Af Amer: >60 mL/min (ref >60)
GFR calc non Af Amer: >60 mL/min (ref >60)
GLUCOSE: 99 mg/dL (ref 65–99)
POTASSIUM: 4.1 mmol/L (ref 3.5–5.1)
Sodium: 138 mmol/L (ref 135–145)
TOTAL PROTEIN: 5.1 g/dL — AB (ref 6.5–8.1)

## 2016-09-12 LAB — HIV ANTIBODY (ROUTINE TESTING W REFLEX): HIV Screen 4th Generation wRfx: NONREACTIVE

## 2016-09-12 SURGERY — ESOPHAGOGASTRODUODENOSCOPY (EGD) WITH PROPOFOL
Anesthesia: Monitor Anesthesia Care | Laterality: Left

## 2016-09-12 MED ORDER — PROPOFOL 500 MG/50ML IV EMUL
INTRAVENOUS | Status: DC | PRN
  Administered 2016-09-12: 75 ug/kg/min via INTRAVENOUS

## 2016-09-12 MED ORDER — FENTANYL CITRATE (PF) 100 MCG/2ML IJ SOLN
25.0000 ug | INTRAMUSCULAR | Status: DC | PRN
Start: 2016-09-12 — End: 2016-09-12

## 2016-09-12 MED ORDER — PANTOPRAZOLE SODIUM 40 MG PO TBEC
40.0000 mg | DELAYED_RELEASE_TABLET | Freq: Two times a day (BID) | ORAL | Status: DC
  Administered 2016-09-12 – 2016-09-14 (×5): 40 mg via ORAL
  Filled 2016-09-12 (×4): qty 1

## 2016-09-12 MED ORDER — PROMETHAZINE HCL 25 MG/ML IJ SOLN: 6.2500 mg | INTRAMUSCULAR | Status: DC | PRN

## 2016-09-12 MED ORDER — BUTAMBEN-TETRACAINE-BENZOCAINE 2-2-14 % EX AERO
INHALATION_SPRAY | CUTANEOUS | Status: DC | PRN
  Administered 2016-09-12: 1 via TOPICAL

## 2016-09-12 MED ORDER — LACTATED RINGERS IV SOLN
Freq: Once | INTRAVENOUS | Status: AC
  Administered 2016-09-12: 1000 mL via INTRAVENOUS

## 2016-09-12 MED ORDER — SODIUM CHLORIDE 0.9 % IV SOLN: INTRAVENOUS | Status: DC

## 2016-09-12 SURGICAL SUPPLY — 14 items

## 2016-09-12 NOTE — Transfer of Care (Signed)
Immediate Anesthesia Transfer of Care Note  Patient: Thomas Santiago  Procedure(s) Performed: Procedure(s): ESOPHAGOGASTRODUODENOSCOPY (EGD) WITH PROPOFOL (Left)  Patient Location: Endoscopy Unit  Anesthesia Type:MAC  Level of Consciousness: awake, alert , oriented and patient cooperative  Airway & Oxygen Therapy: Patient Spontanous Breathing and Patient connected to nasal cannula oxygen  Post-op Assessment: Report given to RN, Post -op Vital signs reviewed and stable and Patient moving all extremities X 4  Post vital signs: Reviewed and stable  Last Vitals:  Vitals:   09/12/16 1328 09/12/16 1420  BP:  122/74  Pulse:  88  Resp:  15  Temp: 37 C 36.7 C    Last Pain:  Vitals:   09/12/16 1420  TempSrc: Oral         Complications: No apparent anesthesia complications

## 2016-09-12 NOTE — Interval H&P Note (Signed)
History and Physical Interval Note:  09/12/2016 1:24 PM  Thomas Santiago  has presented today for surgery, with the diagnosis of melena, anemia  The various methods of treatment have been discussed with the patient and family. After consideration of risks, benefits and other options for treatment, the patient has consented to  Procedure(s): ESOPHAGOGASTRODUODENOSCOPY (EGD) WITH PROPOFOL (Left) as a surgical intervention .  The patient's history has been reviewed, patient examined, no change in status, stable for surgery.  I have reviewed the patient's chart and labs.  Questions were answered to the patient's satisfaction.     [name] C

## 2016-09-12 NOTE — Progress Notes (Signed)
EGD showed 6 less than 5 mm antral ulcers with mainly clean bases but 2 with small flat cherry red spots was no active bleeding. This is likely NSAID-induced. Biopsies were taken to rule out H. pylori. We'll continue PPI, review NSAID history and await H. pylori testing.

## 2016-09-12 NOTE — Op Note (Signed)
[facility] Patient Name: Thomas Santiago Procedure Date : 09/12/2016 MRN: [id number] Attending MD: Barrie Folk , MD Date of Birth: 17-Feb-1966 CSN: [id number] Age: 51 Admit Type: Inpatient Procedure:                Upper GI endoscopy Indications:              Melena Providers:                Everardo All. Madilyn Fireman, MD, Dwain Sarna, RN, Kandice Robinsons, Technician Referring MD:              Medicines:                Propofol per Anesthesia Complications:            No immediate complications. Estimated Blood Loss:     Estimated blood loss: none. Procedure:                Pre-Anesthesia Assessment:                           - Prior to the procedure, a History and Physical                            was performed, and patient medications and                            allergies were reviewed. The patient's tolerance of                            previous anesthesia was also reviewed. The risks                            and benefits of the procedure and the sedation                            options and risks were discussed with the patient.                            All questions were answered, and informed consent                            was obtained. Prior Anticoagulants: The patient has                            taken no previous anticoagulant or antiplatelet                            agents. ASA Grade Assessment: II - A patient with                            mild systemic disease. After reviewing the risks  and benefits, the patient was deemed in                            satisfactory condition to undergo the procedure.                           After obtaining informed consent, the endoscope was                            passed under direct vision. Throughout the                            procedure, the patient's blood pressure, pulse, and                            oxygen saturations were monitored continuously.  The                            EG-[id number] ([id number]) scope was introduced through the                            mouth, and advanced to the second part of duodenum.                            The upper GI endoscopy was accomplished without                            difficulty. The patient tolerated the procedure                            well. Scope In: Scope Out: Findings:      Six non-bleeding cratered gastric ulcers with pigmented material were       found at the incisura and in the gastric antrum. The largest lesion was       5 mm in largest dimension. Biopsies were taken with a cold forceps for       histology.      The exam was otherwise without abnormality. Impression:               - Non-bleeding gastric ulcers with pigmented                            material. Biopsied.                           - The examination was otherwise normal. Moderate Sedation:      no moderate sedation Recommendation:           - Await pathology results.                           - Use Protonix (pantoprazole) 40 mg PO BID.                           - No aspirin, ibuprofen, naproxen, or other  non-steroidal anti-inflammatory drugs.                           - Resume previous diet. Procedure Code(s):        --- Professional ---                           [id number], Esophagogastroduodenoscopy, flexible,                            transoral; with biopsy, single or multiple Diagnosis Code(s):        --- Professional ---                           K25.9, Gastric ulcer, unspecified as acute or                            chronic, without hemorrhage or perforation                           K92.1, Melena (includes Hematochezia) CPT copyright 2016 American Medical Association. All rights reserved. The codes documented in this report are preliminary and upon coder review may  be revised to meet current compliance requirements. Barrie FolkJohn C Rihanna Marseille, MD 09/12/2016 2:13:06 PM This report has been  signed electronically. Number of Addenda: 0

## 2016-09-12 NOTE — H&P (View-Only) (Signed)
PROGRESS NOTE    Thomas Santiago  NWG:[id number] DOB: 08/28/1965 DOA: 09/11/2016 PCP: Patient, No Pcp Per    Brief Narrative:  Patient is a pleasant 51 year old [place] gentleman presented to the ED with a 2 day history of dizziness with standing, vertigo, 5 day history of melanotic stools. Patient was seen in consultation by gastroenterology and patient for upper endoscopy 09/12/2016.   Assessment & Plan:   Active Problems:   Symptomatic anemia   GIB (gastrointestinal bleeding)   Dizziness  #1 probable upper GI bleed/symptomatic anemia Patient presented with symptomatic anemia with a 2 day history of dizziness with standing and not at rest, negative for vertigo, 5 day history of dark melanotic stools. Patient denies any bowel movement today. Patient states some clinical improvement. Continue PPI drip. Gastroenterology following and patient for upper endoscopy today. Hemoglobin currently at 8.2 from 9.9 on admission. Follow H&H.  #2 acute blood loss anemia Secondary to problem #1. Check an anemia panel.     DVT prophylaxis: SCDs. Code Status: Full Family Communication: Updated patient and family at bedside. Disposition Plan: Home when melena has resolved, hemodynamic stability, and per GI.   Consultants:   Gastroenterology: Dr. Dulce Sellarutlaw 09/11/2016  Procedures:   Upper endoscopy pending  Chest x-ray 09/12/2016    Antimicrobials:   None   Subjective: Patient states has not had a bowel movement today. Patient denies any melanotic stools today. No hematemesis. No nausea. No vomiting. No abdominal pain. No chest pain. No shortness of breath. Patient states he's feeling a little bit better than on admission.  Objective: Vitals:   09/11/16 1655 09/11/16 2044 09/12/16 0553 09/12/16 0900  BP: 122/72 123/73 106/62   Pulse: 100 88 80   Resp: 15 15    Temp: 99.1 F (37.3 C) 98 F (36.7 C) 98.3 F (36.8 C)   TempSrc: Oral Oral Oral   SpO2: 100% 94% 95%   Weight:    86.1  kg (189 lb 14.4 oz)  Height:    5\' 6"  (1.676 m)    Intake/Output Summary (Last 24 hours) at 09/12/16 1233 Last data filed at 09/12/16 0455  Gross per 24 hour  Intake          1239.59 ml  Output                0 ml  Net          1239.59 ml   Filed Weights   09/12/16 0900  Weight: 86.1 kg (189 lb 14.4 oz)    Examination:  General exam: Appears calm and comfortable  Respiratory system: Clear to auscultation. Respiratory effort normal. Cardiovascular system: S1 & S2 heard, RRR. No JVD, murmurs, rubs, gallops or clicks. No pedal edema. Gastrointestinal system: Abdomen is nondistended, soft and nontender. No organomegaly or masses felt. Normal bowel sounds heard. Central nervous system: Alert and oriented. No focal neurological deficits. Extremities: Symmetric 5 x 5 power. Skin: No rashes, lesions or ulcers Psychiatry: Judgement and insight appear normal. Mood & affect appropriate.     Data Reviewed: I have personally reviewed following labs and imaging studies  CBC:  Recent Labs Lab 09/11/16 1006 09/11/16 1152 09/12/16 0500  WBC 9.9 11.0* 7.6  NEUTROABS  --  8.2*  --   HGB 10.1* 9.9* 8.2*  HCT 29.5* 30.2* 25.8*  MCV 89.1 91.0 91.2  PLT  --  220 204   Basic Metabolic Panel:  Recent Labs Lab 09/11/16 1152 09/12/16 0500  NA 138 138  K 4.3 4.1  CL 106 104  CO2 24 27  GLUCOSE 120* 99  BUN 26* 15  CREATININE 0.64 0.77  CALCIUM 8.3* 8.1*   GFR: Estimated Creatinine Clearance: 111.1 mL/min (by C-G formula based on SCr of 0.77 mg/dL). Liver Function Tests:  Recent Labs Lab 09/11/16 1152 09/12/16 0500  AST 19 17  ALT 17 18  ALKPHOS 34* 33*  BILITOT 0.7 0.8  PROT 6.0* 5.1*  ALBUMIN 3.4* 2.8*    Recent Labs Lab 09/11/16 1152  LIPASE 28   No results for input(s): AMMONIA in the last 168 hours. Coagulation Profile:  Recent Labs Lab 09/11/16 1152  INR 1.03   Cardiac Enzymes: No results for input(s): CKTOTAL, CKMB, CKMBINDEX, TROPONINI in the last  168 hours. BNP (last 3 results) No results for input(s): PROBNP in the last 8760 hours. HbA1C: No results for input(s): HGBA1C in the last 72 hours. CBG: No results for input(s): GLUCAP in the last 168 hours. Lipid Profile: No results for input(s): CHOL, HDL, LDLCALC, TRIG, CHOLHDL, LDLDIRECT in the last 72 hours. Thyroid Function Tests: No results for input(s): TSH, T4TOTAL, FREET4, T3FREE, THYROIDAB in the last 72 hours. Anemia Panel:  Recent Labs  09/11/16 1546  VITAMINB12 389  FOLATE 17.1  FERRITIN 155  TIBC 259  IRON 90  RETICCTPCT 2.9   Sepsis Labs:  Recent Labs Lab 09/11/16 1215  LATICACIDVEN 1.86    No results found for this or any previous visit (from the past 240 hour(s)).       Radiology Studies: X-ray Chest Pa And Lateral  Result Date: 09/12/2016 CLINICAL DATA:  Dizziness EXAM: CHEST  2 VIEW COMPARISON:  None. FINDINGS: Normal heart size. Normal mediastinal contour. No pneumothorax. No pleural effusion. Lungs appear clear, with no acute consolidative airspace disease and no pulmonary edema. IMPRESSION: No active cardiopulmonary disease. Electronically Signed   By: Delbert Phenix M.D.   On: 09/12/2016 09:46        Scheduled Meds: . loratadine  10 mg Oral Daily   Continuous Infusions: . sodium chloride 125 mL/hr at 09/12/16 1122  . pantoprozole (PROTONIX) infusion 8 mg/hr (09/12/16 0402)     LOS: 0 days    Time spent: 35 minutes    Sharad Vaneaton, MD [facility] Pager [phone]  If 7PM-7AM, please contact night-coverage [email or web address] Password [facility] 09/12/2016, 12:33 PM

## 2016-09-12 NOTE — Anesthesia Preprocedure Evaluation (Addendum)
Anesthesia Evaluation  Patient identified by MRN, date of birth, ID band Patient awake    Reviewed: Allergy & Precautions, NPO status , Patient's Chart, lab work & pertinent test results  Airway Mallampati: II  TM Distance: >3 FB Neck ROM: Full    Dental  (+) Dental Advisory Given, Implants   Pulmonary neg pulmonary ROS,    Pulmonary exam normal breath sounds clear to auscultation       Cardiovascular negative cardio ROS Normal cardiovascular exam Rhythm:Regular Rate:Normal     Neuro/Psych negative neurological ROS     GI/Hepatic negative GI ROS, Neg liver ROS,   Endo/Other  Obesity   Renal/GU negative Renal ROS     Musculoskeletal negative musculoskeletal ROS (+)   Abdominal   Peds  Hematology negative hematology ROS (+) Blood dyscrasia, anemia ,   Anesthesia Other Findings Day of surgery medications reviewed with the patient.  Reproductive/Obstetrics                             Anesthesia Physical Anesthesia Plan  ASA: II  Anesthesia Plan: MAC   Post-op Pain Management:    Induction: Intravenous  Airway Management Planned: Nasal Cannula  Additional Equipment:   Intra-op Plan:   Post-operative Plan:   Informed Consent: I have reviewed the patients History and Physical, chart, labs and discussed the procedure including the risks, benefits and alternatives for the proposed anesthesia with the patient or authorized representative who has indicated his/her understanding and acceptance.   Dental advisory given  Plan Discussed with: CRNA, Anesthesiologist and Surgeon  Anesthesia Plan Comments: (Discussed risks/benefits/alternatives to MAC sedation including need for ventilatory support, hypotension, need for conversion to general anesthesia.  All patient questions answered.  Patient/guardian wishes to proceed.)       Anesthesia Quick Evaluation

## 2016-09-12 NOTE — Anesthesia Procedure Notes (Signed)
Procedure Name: MAC Date/Time: 09/12/2016 1:58 PM Performed by: Carney Living Pre-anesthesia Checklist: Patient identified, Emergency Drugs available, Suction available, Patient being monitored and Timeout performed Patient Re-evaluated:Patient Re-evaluated prior to inductionOxygen Delivery Method: Nasal cannula

## 2016-09-12 NOTE — Progress Notes (Signed)
PROGRESS NOTE    Thomas Santiago  MRN:[id number] DOB: 10/02/1965 DOA: 09/11/2016 PCP: Patient, No Pcp Per    Brief Narrative:  Patient is a pleasant 50-year-old [place] gentleman presented to the ED with a 2 day history of dizziness with standing, vertigo, 5 day history of melanotic stools. Patient was seen in consultation by gastroenterology and patient for upper endoscopy 09/12/2016.   Assessment & Plan:   Active Problems:   Symptomatic anemia   GIB (gastrointestinal bleeding)   Dizziness  #1 probable upper GI bleed/symptomatic anemia Patient presented with symptomatic anemia with a 2 day history of dizziness with standing and not at rest, negative for vertigo, 5 day history of dark melanotic stools. Patient denies any bowel movement today. Patient states some clinical improvement. Continue PPI drip. Gastroenterology following and patient for upper endoscopy today. Hemoglobin currently at 8.2 from 9.9 on admission. Follow H&H.  #2 acute blood loss anemia Secondary to problem #1. Check an anemia panel.     DVT prophylaxis: SCDs. Code Status: Full Family Communication: Updated patient and family at bedside. Disposition Plan: Home when melena has resolved, hemodynamic stability, and per GI.   Consultants:   Gastroenterology: Dr. Outlaw 09/11/2016  Procedures:   Upper endoscopy pending  Chest x-ray 09/12/2016    Antimicrobials:   None   Subjective: Patient states has not had a bowel movement today. Patient denies any melanotic stools today. No hematemesis. No nausea. No vomiting. No abdominal pain. No chest pain. No shortness of breath. Patient states he's feeling a little bit better than on admission.  Objective: Vitals:   09/11/16 1655 09/11/16 2044 09/12/16 0553 09/12/16 0900  BP: 122/72 123/73 106/62   Pulse: 100 88 80   Resp: 15 15    Temp: 99.1 F (37.3 C) 98 F (36.7 C) 98.3 F (36.8 C)   TempSrc: Oral Oral Oral   SpO2: 100% 94% 95%   Weight:    86.1  kg (189 lb 14.4 oz)  Height:    5' 6" (1.676 m)    Intake/Output Summary (Last 24 hours) at 09/12/16 1233 Last data filed at 09/12/16 0455  Gross per 24 hour  Intake          1239.59 ml  Output                0 ml  Net          1239.59 ml   Filed Weights   09/12/16 0900  Weight: 86.1 kg (189 lb 14.4 oz)    Examination:  General exam: Appears calm and comfortable  Respiratory system: Clear to auscultation. Respiratory effort normal. Cardiovascular system: S1 & S2 heard, RRR. No JVD, murmurs, rubs, gallops or clicks. No pedal edema. Gastrointestinal system: Abdomen is nondistended, soft and nontender. No organomegaly or masses felt. Normal bowel sounds heard. Central nervous system: Alert and oriented. No focal neurological deficits. Extremities: Symmetric 5 x 5 power. Skin: No rashes, lesions or ulcers Psychiatry: Judgement and insight appear normal. Mood & affect appropriate.     Data Reviewed: I have personally reviewed following labs and imaging studies  CBC:  Recent Labs Lab 09/11/16 1006 09/11/16 1152 09/12/16 0500  WBC 9.9 11.0* 7.6  NEUTROABS  --  8.2*  --   HGB 10.1* 9.9* 8.2*  HCT 29.5* 30.2* 25.8*  MCV 89.1 91.0 91.2  PLT  --  220 204   Basic Metabolic Panel:  Recent Labs Lab 09/11/16 1152 09/12/16 0500  NA 138 138  K 4.3 4.1    CL 106 104  CO2 24 27  GLUCOSE 120* 99  BUN 26* 15  CREATININE 0.64 0.77  CALCIUM 8.3* 8.1*   GFR: Estimated Creatinine Clearance: 111.1 mL/min (by C-G formula based on SCr of 0.77 mg/dL). Liver Function Tests:  Recent Labs Lab 09/11/16 1152 09/12/16 0500  AST 19 17  ALT 17 18  ALKPHOS 34* 33*  BILITOT 0.7 0.8  PROT 6.0* 5.1*  ALBUMIN 3.4* 2.8*    Recent Labs Lab 09/11/16 1152  LIPASE 28   No results for input(s): AMMONIA in the last 168 hours. Coagulation Profile:  Recent Labs Lab 09/11/16 1152  INR 1.03   Cardiac Enzymes: No results for input(s): CKTOTAL, CKMB, CKMBINDEX, TROPONINI in the last  168 hours. BNP (last 3 results) No results for input(s): PROBNP in the last 8760 hours. HbA1C: No results for input(s): HGBA1C in the last 72 hours. CBG: No results for input(s): GLUCAP in the last 168 hours. Lipid Profile: No results for input(s): CHOL, HDL, LDLCALC, TRIG, CHOLHDL, LDLDIRECT in the last 72 hours. Thyroid Function Tests: No results for input(s): TSH, T4TOTAL, FREET4, T3FREE, THYROIDAB in the last 72 hours. Anemia Panel:  Recent Labs  09/11/16 1546  VITAMINB12 389  FOLATE 17.1  FERRITIN 155  TIBC 259  IRON 90  RETICCTPCT 2.9   Sepsis Labs:  Recent Labs Lab 09/11/16 1215  LATICACIDVEN 1.86    No results found for this or any previous visit (from the past 240 hour(s)).       Radiology Studies: X-ray Chest Pa And Lateral  Result Date: 09/12/2016 CLINICAL DATA:  Dizziness EXAM: CHEST  2 VIEW COMPARISON:  None. FINDINGS: Normal heart size. Normal mediastinal contour. No pneumothorax. No pleural effusion. Lungs appear clear, with no acute consolidative airspace disease and no pulmonary edema. IMPRESSION: No active cardiopulmonary disease. Electronically Signed   By: Jason A Poff M.D.   On: 09/12/2016 09:46        Scheduled Meds: . loratadine  10 mg Oral Daily   Continuous Infusions: . sodium chloride 125 mL/hr at 09/12/16 1122  . pantoprozole (PROTONIX) infusion 8 mg/hr (09/12/16 0402)     LOS: 0 days    Time spent: 35 minutes    Edwen Mclester, MD [facility] Pager [phone]  If 7PM-7AM, please contact night-coverage [email or web address] Password [email or web address] 09/12/2016, 12:33 PM  

## 2016-09-12 NOTE — Anesthesia Postprocedure Evaluation (Signed)
Anesthesia Post Note  Patient: Thomas Santiago  Procedure(s) Performed: Procedure(s) (LRB): ESOPHAGOGASTRODUODENOSCOPY (EGD) WITH PROPOFOL (Left)  Patient location during evaluation: Endoscopy Anesthesia Type: MAC Level of consciousness: awake and alert Pain management: pain level controlled Vital Signs Assessment: post-procedure vital signs reviewed and stable Respiratory status: spontaneous breathing, nonlabored ventilation, respiratory function stable and patient connected to nasal cannula oxygen Cardiovascular status: stable and blood pressure returned to baseline Anesthetic complications: no       Last Vitals:  Vitals:   09/12/16 1430 09/12/16 1456  BP: 123/75 130/60  Pulse: 77 82  Resp: 10 12  Temp:  36.9 C    Last Pain:  Vitals:   09/12/16 1456  TempSrc: Oral                 Catalina Gravel

## 2016-09-13 DIAGNOSIS — T39315A Adverse effect of propionic acid derivatives, initial encounter: Secondary | ICD-10-CM | POA: Diagnosis present

## 2016-09-13 DIAGNOSIS — K921 Melena: Secondary | ICD-10-CM | POA: Diagnosis present

## 2016-09-13 DIAGNOSIS — B9681 Helicobacter pylori [H. pylori] as the cause of diseases classified elsewhere: Secondary | ICD-10-CM | POA: Diagnosis present

## 2016-09-13 DIAGNOSIS — E669 Obesity, unspecified: Secondary | ICD-10-CM | POA: Diagnosis present

## 2016-09-13 DIAGNOSIS — Z6832 Body mass index (BMI) 32.0-32.9, adult: Secondary | ICD-10-CM | POA: Diagnosis not present

## 2016-09-13 DIAGNOSIS — Z79899 Other long term (current) drug therapy: Secondary | ICD-10-CM | POA: Diagnosis not present

## 2016-09-13 DIAGNOSIS — R42 Dizziness and giddiness: Secondary | ICD-10-CM | POA: Diagnosis not present

## 2016-09-13 DIAGNOSIS — D649 Anemia, unspecified: Secondary | ICD-10-CM | POA: Diagnosis not present

## 2016-09-13 DIAGNOSIS — D62 Acute posthemorrhagic anemia: Secondary | ICD-10-CM | POA: Diagnosis present

## 2016-09-13 DIAGNOSIS — K259 Gastric ulcer, unspecified as acute or chronic, without hemorrhage or perforation: Secondary | ICD-10-CM | POA: Diagnosis not present

## 2016-09-13 DIAGNOSIS — Z9889 Other specified postprocedural states: Secondary | ICD-10-CM | POA: Diagnosis not present

## 2016-09-13 DIAGNOSIS — Z791 Long term (current) use of non-steroidal anti-inflammatories (NSAID): Secondary | ICD-10-CM | POA: Diagnosis not present

## 2016-09-13 DIAGNOSIS — K254 Chronic or unspecified gastric ulcer with hemorrhage: Secondary | ICD-10-CM | POA: Diagnosis present

## 2016-09-13 DIAGNOSIS — Y929 Unspecified place or not applicable: Secondary | ICD-10-CM | POA: Diagnosis not present

## 2016-09-13 LAB — CBC WITH DIFFERENTIAL/PLATELET
Basophils Absolute: 0 10*3/uL (ref 0.0–0.1)
Basophils Relative: 0 %
Eosinophils Absolute: 0.2 10*3/uL (ref 0.0–0.7)
Eosinophils Relative: 2 %
HCT: 22.1 % — ABNORMAL LOW (ref 39.0–52.0)
HEMOGLOBIN: 7.1 g/dL — AB (ref 13.0–17.0)
LYMPHS ABS: 1.5 10*3/uL (ref 0.7–4.0)
LYMPHS PCT: 16 %
MCH: 29.3 pg (ref 26.0–34.0)
MCHC: 32.1 g/dL (ref 30.0–36.0)
MCV: 91.3 fL (ref 78.0–100.0)
Monocytes Absolute: 0.8 10*3/uL (ref 0.1–1.0)
Monocytes Relative: 8 %
NEUTROS ABS: 7.1 10*3/uL (ref 1.7–7.7)
NEUTROS PCT: 74 %
Platelets: 197 10*3/uL (ref 150–400)
RBC: 2.42 MIL/uL — AB (ref 4.22–5.81)
RDW: 13 % (ref 11.5–15.5)
WBC: 9.6 10*3/uL (ref 4.0–10.5)

## 2016-09-13 LAB — PREPARE RBC (CROSSMATCH)

## 2016-09-13 LAB — BASIC METABOLIC PANEL
Anion gap: 7 (ref 5–15)
BUN: 9 mg/dL (ref 6–20)
CHLORIDE: 106 mmol/L (ref 101–111)
CO2: 25 mmol/L (ref 22–32)
Calcium: 7.9 mg/dL — ABNORMAL LOW (ref 8.9–10.3)
Creatinine, Ser: 0.79 mg/dL (ref 0.61–1.24)
GFR calc Af Amer: >60 mL/min (ref >60)
GFR calc non Af Amer: >60 mL/min (ref >60)
GLUCOSE: 101 mg/dL — AB (ref 65–99)
POTASSIUM: 3.6 mmol/L (ref 3.5–5.1)
SODIUM: 138 mmol/L (ref 135–145)

## 2016-09-13 MED ORDER — DIPHENHYDRAMINE HCL 25 MG PO CAPS
25.0000 mg | ORAL_CAPSULE | Freq: Once | ORAL | Status: AC
  Administered 2016-09-13: 25 mg via ORAL
  Filled 2016-09-13: qty 1

## 2016-09-13 MED ORDER — SODIUM CHLORIDE 0.9 % IV SOLN
Freq: Once | INTRAVENOUS | Status: AC
  Administered 2016-09-13: 08:00:00 via INTRAVENOUS

## 2016-09-13 MED ORDER — ACETAMINOPHEN 325 MG PO TABS
650.0000 mg | ORAL_TABLET | Freq: Once | ORAL | Status: AC
  Administered 2016-09-13: 650 mg via ORAL
  Filled 2016-09-13: qty 2

## 2016-09-13 NOTE — Plan of Care (Signed)
Problem: Bowel/Gastric: Goal: Will show no signs and symptoms of gastrointestinal bleeding Outcome: Progressing Patient had black stool tonight, but no bright red bleeding and not tarry. Could possibly be old blood. EGD today showed no active bleeding.

## 2016-09-13 NOTE — Progress Notes (Signed)
Second unit blood transfusion running now

## 2016-09-13 NOTE — Progress Notes (Signed)
1 unit blood transfusion done, no any side effects noted, vitals stable throughout the time, waiting for second one to transfuse shortly.

## 2016-09-13 NOTE — Progress Notes (Signed)
[facility] Progress Note  Subjective: Feels okay. 1 black stool last night  Objective: Vital signs in last 24 hours: Temp:  [98 F (36.7 C)-98.7 F (37.1 C)] 98.4 F (36.9 C) (05/06 0801) Pulse Rate:  [77-91] 89 (05/06 0801) Resp:  [10-18] 18 (05/06 0801) BP: (106-131)/(58-76) 114/69 (05/06 0801) SpO2:  [99 %-100 %] 99 % (05/06 0801) Weight:  [85.1 kg (187 lb 9.8 oz)-86.1 kg (189 lb 14.4 oz)] 85.1 kg (187 lb 9.8 oz) (05/06 0446) Weight change:    PE: Unchanged  Lab Results: Results for orders placed or performed during the hospital encounter of 09/11/16 (from the past 24 hour(s))  CBC with Differential/Platelet     Status: Abnormal   Collection Time: 09/13/16  4:59 AM  Result Value Ref Range   WBC 9.6 4.0 - 10.5 K/uL   RBC 2.42 (L) 4.22 - 5.81 MIL/uL   Hemoglobin 7.1 (L) 13.0 - 17.0 g/dL   HCT 16.122.1 (L) 09.639.0 - 04.552.0 %   MCV 91.3 78.0 - 100.0 fL   MCH 29.3 26.0 - 34.0 pg   MCHC 32.1 30.0 - 36.0 g/dL   RDW 40.913.0 81.111.5 - 91.415.5 %   Platelets 197 150 - 400 K/uL   Neutrophils Relative % 74 %   Neutro Abs 7.1 1.7 - 7.7 K/uL   Lymphocytes Relative 16 %   Lymphs Abs 1.5 0.7 - 4.0 K/uL   Monocytes Relative 8 %   Monocytes Absolute 0.8 0.1 - 1.0 K/uL   Eosinophils Relative 2 %   Eosinophils Absolute 0.2 0.0 - 0.7 K/uL   Basophils Relative 0 %   Basophils Absolute 0.0 0.0 - 0.1 K/uL  Basic metabolic panel     Status: Abnormal   Collection Time: 09/13/16  4:59 AM  Result Value Ref Range   Sodium 138 135 - 145 mmol/L   Potassium 3.6 3.5 - 5.1 mmol/L   Chloride 106 101 - 111 mmol/L   CO2 25 22 - 32 mmol/L   Glucose, Bld 101 (H) 65 - 99 mg/dL   BUN 9 6 - 20 mg/dL   Creatinine, Ser 7.820.79 0.61 - 1.24 mg/dL   Calcium 7.9 (L) 8.9 - 10.3 mg/dL   GFR calc non Af Amer >60 >60 mL/min   GFR calc Af Amer >60 >60 mL/min   Anion gap 7 5 - 15  Prepare RBC     Status: None   Collection Time: 09/13/16  6:55 AM  Result Value Ref Range   Order Confirmation ORDER PROCESSED BY BLOOD  BANK     Studies/Results: X-ray Chest Pa And Lateral  Result Date: 09/12/2016 CLINICAL DATA:  Dizziness EXAM: CHEST  2 VIEW COMPARISON:  None. FINDINGS: Normal heart size. Normal mediastinal contour. No pneumothorax. No pleural effusion. Lungs appear clear, with no acute consolidative airspace disease and no pulmonary edema. IMPRESSION: No active cardiopulmonary disease. Electronically Signed   By: Delbert PhenixJason A Poff M.D.   On: 09/12/2016 09:46      Assessment: Multiple NSAID-induced gastric ulcers, rule out concomitant H. pylori  Plan: 1. Transfuse 2 units PRBCs today 2. Continue regular diet 3. Check H. pylori status 4. Avoid NSAIDs 5. Probably home in the morning.    Keaghan Bowens C 09/13/2016, 8:02 AM  Pager [phone] If no answer or after 5 PM call [phone]

## 2016-09-13 NOTE — Progress Notes (Signed)
PROGRESS NOTE    Thomas Santiago  ZOX:[id number] DOB: [birth year] DOA: 09/11/2016 PCP: Thomas Santiago, No Pcp Per    Brief Narrative:  Thomas Santiago is a pleasant 51 year old [place] gentleman presented to the ED with a 2 day history of dizziness with standing, vertigo, 5 day history of melanotic stools. Thomas Santiago was seen in consultation by gastroenterology and Thomas Santiago for upper endoscopy 09/12/2016.   Assessment & Plan:   Active Problems:   Symptomatic anemia   GIB (gastrointestinal bleeding)   Dizziness  probable upper GI bleed/symptomatic anemia - presented with symptomatic anemia with a 2 day history of dizziness with standing and not at rest, negative for vertigo, 5 day history of dark melanotic stools.  -Gastroenterology following: s/p EGD: - Non-bleeding gastric ulcers with pigmented material. Biopsied-- await path/H pylori -PPI BID -avoid aspirin, ibuprofen, naproxen, or other non-steroidal anti-inflammatory drugs.  acute blood loss anemia Down to 7.1-- transfuse 2 units PRBC -cbc in AM     DVT prophylaxis: SCDs. Code Status: Full Family Communication: Updated Thomas Santiago Disposition Plan: suspect home in AM   Consultants:   Gastroenterology: Dr. Dulce Sellar 09/11/2016  Procedures:   Upper endoscopy pending  Chest x-ray 09/12/2016   Antimicrobials:   None   Subjective: Had dark stool last PM No further BM today, no abdominal pain  Objective: Vitals:   09/13/16 0140 09/13/16 0446 09/13/16 0801 09/13/16 0815  BP: (!) 106/58 114/62 114/69 114/69  Pulse: 91 84 89 88  Resp: 16 17 18 18   Temp: 98.7 F (37.1 C) 98.6 F (37 C) 98.4 F (36.9 C) 98 F (36.7 C)  TempSrc: Oral Oral Oral Oral  SpO2: 100% 99% 99% 99%  Weight:  85.1 kg (187 lb 9.8 oz)    Height:        Intake/Output Summary (Last 24 hours) at 09/13/16 8119 Last data filed at 09/13/16 0815  Gross per 24 hour  Intake             1810 ml  Output                0 ml  Net             1810 ml   Filed Weights     09/12/16 0900 09/13/16 0446  Weight: 86.1 kg (189 lb 14.4 oz) 85.1 kg (187 lb 9.8 oz)    Examination:  General exam: NAD- playing game on phone, pop-eyes at bedside Respiratory system: clear, no wheezing Cardiovascular system: rrr Gastrointestinal system: +BS, soft, NT Central nervous system: A+Ox3, NAD Extremities: Symmetric 5 x 5 power.     Data Reviewed: I have personally reviewed following labs and imaging studies  CBC:  Recent Labs Lab 09/11/16 1006 09/11/16 1152 09/12/16 0500 09/13/16 0459  WBC 9.9 11.0* 7.6 9.6  NEUTROABS  --  8.2*  --  7.1  HGB 10.1* 9.9* 8.2* 7.1*  HCT 29.5* 30.2* 25.8* 22.1*  MCV 89.1 91.0 91.2 91.3  PLT  --  220 204 197   Basic Metabolic Panel:  Recent Labs Lab 09/11/16 1152 09/12/16 0500 09/13/16 0459  NA 138 138 138  K 4.3 4.1 3.6  CL 106 104 106  CO2 24 27 25   GLUCOSE 120* 99 101*  BUN 26* 15 9  CREATININE 0.64 0.77 0.79  CALCIUM 8.3* 8.1* 7.9*   GFR: Estimated Creatinine Clearance: 110.5 mL/min (by C-G formula based on SCr of 0.79 mg/dL). Liver Function Tests:  Recent Labs Lab 09/11/16 1152 09/12/16 0500  AST 19 17  ALT 17  18  ALKPHOS 34* 33*  BILITOT 0.7 0.8  PROT 6.0* 5.1*  ALBUMIN 3.4* 2.8*    Recent Labs Lab 09/11/16 1152  LIPASE 28   No results for input(s): AMMONIA in the last 168 hours. Coagulation Profile:  Recent Labs Lab 09/11/16 1152  INR 1.03   Cardiac Enzymes: No results for input(s): CKTOTAL, CKMB, CKMBINDEX, TROPONINI in the last 168 hours. BNP (last 3 results) No results for input(s): PROBNP in the last 8760 hours. HbA1C: No results for input(s): HGBA1C in the last 72 hours. CBG: No results for input(s): GLUCAP in the last 168 hours. Lipid Profile: No results for input(s): CHOL, HDL, LDLCALC, TRIG, CHOLHDL, LDLDIRECT in the last 72 hours. Thyroid Function Tests: No results for input(s): TSH, T4TOTAL, FREET4, T3FREE, THYROIDAB in the last 72 hours. Anemia Panel:  Recent Labs   09/11/16 1546  VITAMINB12 389  FOLATE 17.1  FERRITIN 155  TIBC 259  IRON 90  RETICCTPCT 2.9   Sepsis Labs:  Recent Labs Lab 09/11/16 1215  LATICACIDVEN 1.86    No results found for this or any previous visit (from the past 240 hour(s)).       Radiology Studies: X-ray Chest Pa And Lateral  Result Date: 09/12/2016 CLINICAL DATA:  Dizziness EXAM: CHEST  2 VIEW COMPARISON:  None. FINDINGS: Normal heart size. Normal mediastinal contour. No pneumothorax. No pleural effusion. Lungs appear clear, with no acute consolidative airspace disease and no pulmonary edema. IMPRESSION: No active cardiopulmonary disease. Electronically Signed   By: Delbert PhenixJason A Poff M.D.   On: 09/12/2016 09:46        Scheduled Meds: . loratadine  10 mg Oral Daily  . pantoprazole  40 mg Oral BID   Continuous Infusions: . sodium chloride 125 mL/hr at 09/13/16 0512     LOS: 0 days    Time spent: 25 minutes    Trayson Stitely U Jericka Kadar, DO [facility] Pager [phone]  If 7PM-7AM, please contact night-coverage [email or web address] Password [email or web address] 09/13/2016, 9:17 AM

## 2016-09-13 NOTE — Plan of Care (Signed)
Problem: Safety: Goal: Ability to remain free from injury will improve Outcome: Progressing Patient has remained free of falls during this admission. Call bell within reach. Bed in low and locked position. Patient alert and oriented. Clean and clear environment maintained. 3/4 siderails in place. Nonskid footwear being utilized. Patient verbalized understanding of safety instruction. Patient ambulates well independently.

## 2016-09-14 ENCOUNTER — Encounter (HOSPITAL_COMMUNITY): Payer: Self-pay | Admitting: Gastroenterology

## 2016-09-14 LAB — BASIC METABOLIC PANEL
Anion gap: 5 (ref 5–15)
BUN: 8 mg/dL (ref 6–20)
CALCIUM: 8.7 mg/dL — AB (ref 8.9–10.3)
CO2: 29 mmol/L (ref 22–32)
CREATININE: 0.88 mg/dL (ref 0.61–1.24)
Chloride: 105 mmol/L (ref 101–111)
Glucose, Bld: 107 mg/dL — ABNORMAL HIGH (ref 65–99)
Potassium: 4 mmol/L (ref 3.5–5.1)
Sodium: 139 mmol/L (ref 135–145)

## 2016-09-14 LAB — TYPE AND SCREEN
ABO/RH(D): O POS
Antibody Screen: NEGATIVE
UNIT DIVISION: 0
Unit division: 0

## 2016-09-14 LAB — CBC
HCT: 30 % — ABNORMAL LOW (ref 39.0–52.0)
Hemoglobin: 9.6 g/dL — ABNORMAL LOW (ref 13.0–17.0)
MCH: 28.7 pg (ref 26.0–34.0)
MCHC: 32 g/dL (ref 30.0–36.0)
MCV: 89.8 fL (ref 78.0–100.0)
PLATELETS: 212 10*3/uL (ref 150–400)
RBC: 3.34 MIL/uL — ABNORMAL LOW (ref 4.22–5.81)
RDW: 14.2 % (ref 11.5–15.5)
WBC: 7.9 10*3/uL (ref 4.0–10.5)

## 2016-09-14 LAB — BPAM RBC
Blood Product Expiration Date: 201805302359
Blood Product Expiration Date: 201805302359
ISSUE DATE / TIME: 201805060805
ISSUE DATE / TIME: 201805061225
Unit Type and Rh: 5100
Unit Type and Rh: 5100

## 2016-09-14 MED ORDER — PANTOPRAZOLE SODIUM 40 MG PO TBEC: 40.0000 mg | DELAYED_RELEASE_TABLET | Freq: Two times a day (BID) | ORAL | 0 refills | Status: DC

## 2016-09-14 NOTE — Progress Notes (Signed)
Noted from MD note that H. Pylori has been checked from the biopsy of upper endoscopy done 09/11/16

## 2016-09-14 NOTE — Care Management Note (Signed)
Case Management Note  Patient Details  Name: Fortino SicJimmy Choplin MRN: [id number] Date of Birth: 1966/02/05  Subjective/Objective:                    Action/Plan:  symptomatic anemia with a 2 day history of dizziness  vertigo, 5 day history of dark melanotic stools.  -Gastroenterology following: s/p EGD: - Non-bleeding gastric ulcers with pigmented material. Biopsied-- await path/H pylori -PPI BID   Down to 7.1-- transfuse 2 units PRBC, 5/7 hgb 9.6   Expected Discharge Date:                  Expected Discharge Plan:  Home/Self Care  In-House Referral:     Discharge planning Services     Post Acute Care Choice:    Choice offered to:     DME Arranged:    DME Agency:     HH Arranged:    HH Agency:     Status of Service:  In process, will continue to follow  If discussed at Long Length of Stay Meetings, dates discussed:    Additional Comments:  Kingsley PlanWile, Jazleen Robeck Marie, RN 09/14/2016, 10:58 AM

## 2016-09-14 NOTE — Progress Notes (Signed)
Patient discharged to home with instructions. 

## 2016-09-14 NOTE — Discharge Summary (Signed)
Physician Discharge Summary  Thomas SicJimmy Ambrosio ZOX:[id number] DOB: 04-16-1966 DOA: 09/11/2016  PCP: Patient, No Pcp Per  Admit date: 09/11/2016 Discharge date: 09/14/2016   Recommendations for Outpatient Follow-Up:   Path pending- please follow up Cbc 1 month Avoid NSAIDS  Discharge Diagnosis:   Active Problems:   Symptomatic anemia   GIB (gastrointestinal bleeding)   Dizziness   Discharge disposition:  Home  Discharge Condition: Improved.  Diet recommendation: Low sodium, heart healthy  Wound care: None.   History of Present Illness:   Thomas Santiago is a 51 y.o. male who presents with likely upper GI bleeding due to recent NSAID use. He has a 5 day history of dark stools having 2 stools a day. He has been using NSAIDs for "allergies". He is otherwise healthy. He had an unremarkable colonoscopy in 2015. His physical examination is unremarkable lab data significant for hemoglobin of 9.9 was 10.1 on presentation this was a repeat which is not significantly changed. Patient be admitted under observation and will be seen by GI in consultation.    Hospital Course by Problem:   upper GI bleed/symptomatic anemia due to gastric ulcers - presented with symptomatic anemia with a 2 day history of dizziness with standing and not at rest, negative for vertigo, 5 day history of dark melanotic stools.  -Gastroenterology following: s/p EGD: - Non-bleeding gastric ulcers with pigmented material. Biopsied-- await path/H pylori -PPI BID -avoid aspirin, ibuprofen, naproxen, or other non-steroidal anti-inflammatory drugs.  acute blood loss anemia Down to 7.1-- transfused 2 units PRBC     Medical Consultants:    GI   Discharge Exam:   Vitals:   09/13/16 2328 09/14/16 0601  BP: 129/80 122/80  Pulse: 86 73  Resp: 17 17  Temp: 98.5 F (36.9 C) 97.9 F (36.6 C)   Vitals:   09/13/16 1257 09/13/16 1535 09/13/16 2328 09/14/16 0601  BP: 108/71 119/85 129/80 122/80  Pulse: 82  86 73    Resp: 18 18 17 17   Temp: 98 F (36.7 C) 98 F (36.7 C) 98.5 F (36.9 C) 97.9 F (36.6 C)  TempSrc: Oral Oral Oral Oral  SpO2: 100% 100% 100% 100%  Weight:    91 kg (200 lb 9.9 oz)  Height:        Gen:  NAD   The results of significant diagnostics from this hospitalization (including imaging, microbiology, ancillary and laboratory) are listed below for reference.     Procedures and Diagnostic Studies:   X-ray Chest Pa And Lateral  Result Date: 09/12/2016 CLINICAL DATA:  Dizziness EXAM: CHEST  2 VIEW COMPARISON:  None. FINDINGS: Normal heart size. Normal mediastinal contour. No pneumothorax. No pleural effusion. Lungs appear clear, with no acute consolidative airspace disease and no pulmonary edema. IMPRESSION: No active cardiopulmonary disease. Electronically Signed   By: Delbert PhenixJason A Poff M.D.   On: 09/12/2016 09:46     Labs:   Basic Metabolic Panel:  Recent Labs Lab 09/11/16 1152 09/12/16 0500 09/13/16 0459 09/14/16 0402  NA 138 138 138 139  K 4.3 4.1 3.6 4.0  CL 106 104 106 105  CO2 24 27 25 29   GLUCOSE 120* 99 101* 107*  BUN 26* 15 9 8   CREATININE 0.64 0.77 0.79 0.88  CALCIUM 8.3* 8.1* 7.9* 8.7*   GFR Estimated Creatinine Clearance: 103.8 mL/min (by C-G formula based on SCr of 0.88 mg/dL). Liver Function Tests:  Recent Labs Lab 09/11/16 1152 09/12/16 0500  AST 19 17  ALT 17 18  ALKPHOS 34*  33*  BILITOT 0.7 0.8  PROT 6.0* 5.1*  ALBUMIN 3.4* 2.8*    Recent Labs Lab 09/11/16 1152  LIPASE 28   No results for input(s): AMMONIA in the last 168 hours. Coagulation profile  Recent Labs Lab 09/11/16 1152  INR 1.03    CBC:  Recent Labs Lab 09/11/16 1006 09/11/16 1152 09/12/16 0500 09/13/16 0459 09/14/16 0402  WBC 9.9 11.0* 7.6 9.6 7.9  NEUTROABS  --  8.2*  --  7.1  --   HGB 10.1* 9.9* 8.2* 7.1* 9.6*  HCT 29.5* 30.2* 25.8* 22.1* 30.0*  MCV 89.1 91.0 91.2 91.3 89.8  PLT  --  220 204 197 212   Cardiac Enzymes: No results for input(s):  CKTOTAL, CKMB, CKMBINDEX, TROPONINI in the last 168 hours. BNP: Invalid input(s): POCBNP CBG: No results for input(s): GLUCAP in the last 168 hours. D-Dimer No results for input(s): DDIMER in the last 72 hours. Hgb A1c No results for input(s): HGBA1C in the last 72 hours. Lipid Profile No results for input(s): CHOL, HDL, LDLCALC, TRIG, CHOLHDL, LDLDIRECT in the last 72 hours. Thyroid function studies No results for input(s): TSH, T4TOTAL, T3FREE, THYROIDAB in the last 72 hours.  Invalid input(s): FREET3 Anemia work up  Recent Labs  09/11/16 1546  VITAMINB12 389  FOLATE 17.1  FERRITIN 155  TIBC 259  IRON 90  RETICCTPCT 2.9   Microbiology No results found for this or any previous visit (from the past 240 hour(s)).   Discharge Instructions:   Discharge Instructions    Diet general    Complete by:  As directed    Discharge instructions    Complete by:  As directed    Close GI follow up for biopsy results Avoid NSAIDs   Increase activity slowly    Complete by:  As directed      Allergies as of 09/14/2016   No Known Allergies     Medication List    TAKE these medications   fexofenadine 30 MG tablet Commonly known as:  ALLEGRA Take 30 mg by mouth 2 (two) times daily.   pantoprazole 40 MG tablet Commonly known as:  PROTONIX Take 1 tablet (40 mg total) by mouth 2 (two) times daily.      Follow-up Information    PCP Follow up.        Dorena Cookey, MD Follow up.   Specialty:  Gastroenterology Contact information: [address] [place] [address] [phone]            Time coordinating discharge: 35 min  Signed:  Luverta Korte Juanetta Gosling   [facility] 09/14/2016, 2:12 PM

## 2016-09-14 NOTE — Progress Notes (Signed)
Stool collected for H.pylori. Staff from lab called to notify the patient's nurse that they don't run point of care. She also stated that they will place the stool in the micro in case if it is needed later.

## 2017-03-23 LAB — HEP C AB WITH REFLEX QUANT (EXT): HEPATITIS C ANTIBODY (EXT): NONREACTIVE

## 2017-04-07 LAB — LIPID PROFILE (EXT)
Cholesterol (EXT): 198 mg/dL (ref 0–200)
HDL Cholesterol (EXT): 43 mg/dL (ref 40–60)
LDL Cholesterol (EXT): 100.7 mg/dL — ABNORMAL HIGH (ref 0.0–99.0)
Triglycerides (EXT): 326 mg/dL — ABNORMAL HIGH (ref 15–150)

## 2017-04-13 LAB — LIPID PROFILE (EXT)
Cholesterol (EXT): 192 mg/dL (ref 0–200)
HDL Cholesterol (EXT): 43 mg/dL (ref 40–60)
LDL Cholesterol (EXT): 103.7 mg/dL — ABNORMAL HIGH (ref 0.0–99.0)
Triglycerides (EXT): 272 mg/dL — ABNORMAL HIGH (ref 15–150)

## 2017-08-04 ENCOUNTER — Ambulatory Visit (INDEPENDENT_AMBULATORY_CARE_PROVIDER_SITE_OTHER): Payer: Managed Care, Other (non HMO) | Admitting: Physician Assistant

## 2017-08-04 ENCOUNTER — Encounter: Payer: Self-pay | Admitting: Physician Assistant

## 2017-08-04 ENCOUNTER — Other Ambulatory Visit: Payer: Self-pay

## 2017-08-04 VITALS — BP 120/84 | HR 71 | Temp 98.6°F | Resp 18 | Ht 66.0 in | Wt 195.8 lb

## 2017-08-04 DIAGNOSIS — Z13228 Encounter for screening for other metabolic disorders: Secondary | ICD-10-CM | POA: Diagnosis not present

## 2017-08-04 DIAGNOSIS — Z1211 Encounter for screening for malignant neoplasm of colon: Secondary | ICD-10-CM | POA: Diagnosis not present

## 2017-08-04 DIAGNOSIS — Z114 Encounter for screening for human immunodeficiency virus [HIV]: Secondary | ICD-10-CM

## 2017-08-04 DIAGNOSIS — Z1321 Encounter for screening for nutritional disorder: Secondary | ICD-10-CM

## 2017-08-04 DIAGNOSIS — Z113 Encounter for screening for infections with a predominantly sexual mode of transmission: Secondary | ICD-10-CM | POA: Diagnosis not present

## 2017-08-04 DIAGNOSIS — Z130 Encounter for screening for diseases of the blood and blood-forming organs and certain disorders involving the immune mechanism: Secondary | ICD-10-CM | POA: Diagnosis not present

## 2017-08-04 DIAGNOSIS — Z1329 Encounter for screening for other suspected endocrine disorder: Secondary | ICD-10-CM

## 2017-08-04 DIAGNOSIS — Z0000 Encounter for general adult medical examination without abnormal findings: Secondary | ICD-10-CM

## 2017-08-04 DIAGNOSIS — Z125 Encounter for screening for malignant neoplasm of prostate: Secondary | ICD-10-CM | POA: Diagnosis not present

## 2017-08-04 LAB — POCT CBC
Granulocyte percent: 61.8 %G (ref 37–80)
HCT, POC: 43.6 % (ref 43.5–53.7)
HEMOGLOBIN: 14.5 g/dL (ref 14.1–18.1)
LYMPH, POC: 1.6 (ref 0.6–3.4)
MCH, POC: 29.1 pg (ref 27–31.2)
MCHC: 33.2 g/dL (ref 31.8–35.4)
MCV: 87.7 fL (ref 80–97)
MID (cbc): 0.3 (ref 0–0.9)
MPV: 7.7 fL (ref 0–99.8)
POC Granulocyte: 3 (ref 2–6.9)
POC LYMPH %: 32.2 % (ref 10–50)
POC MID %: 6 % (ref 0–12)
Platelet Count, POC: 216 10*3/uL (ref 142–424)
RBC: 4.97 M/uL (ref 4.69–6.13)
RDW, POC: 13.4 %
WBC: 4.9 10*3/uL (ref 4.6–10.2)

## 2017-08-04 MED ORDER — FLUTICASONE PROPIONATE 50 MCG/ACT NA SUSP: 2.0000 | Freq: Every day | NASAL | 2 refills | Status: DC

## 2017-08-04 NOTE — Progress Notes (Signed)
08/04/2017 10:39 AM   DOB: Dec 31, 1965 / MRN: [id number]  SUBJECTIVE:  Thomas Santiago is a 52 y.o. male presenting for annual exam. He would like a colonoscopy. Was seen by me about 11 months ago for a GI bleed that led to hospitalization.  He feels well today. Is not taking NSAIDs.  Would like a refill of flonase.   Immunization History  Administered Date(s) Administered  . Hepatitis A 02/08/2009  . Hepatitis B 02/08/2009  . Tdap 03/11/2009     He has No Known Allergies.   He  has a past medical history of Allergy.    He  reports that he has never smoked. He has never used smokeless tobacco. He reports that he does not drink alcohol or use drugs. He  reports that he currently engages in sexual activity and has had partners who are Male. The patient  has a past surgical history that includes Hernia repair and Esophagogastroduodenoscopy (egd) with propofol (Left, 09/12/2016).  His family history is not on file.  Review of Systems  Constitutional: Negative for chills, diaphoresis and fever.  Eyes: Negative.   Respiratory: Negative for cough, hemoptysis, sputum production, shortness of breath and wheezing.   Cardiovascular: Negative for chest pain, orthopnea and leg swelling.  Gastrointestinal: Negative for abdominal pain, blood in stool, constipation, diarrhea, heartburn, melena, nausea and vomiting.  Genitourinary: Negative for dysuria, flank pain, frequency, hematuria and urgency.  Skin: Negative for rash.  Neurological: Negative for dizziness, sensory change, speech change, focal weakness and headaches.    The problem list and medications were reviewed and updated by myself where necessary and exist elsewhere in the encounter.   OBJECTIVE:  BP 120/84   Pulse 71   Temp 98.6 F (37 C) (Oral)   Resp 18   Ht 5\' 6"  (1.676 m)   Wt 195 lb 12.8 oz (88.8 kg)   SpO2 98%   BMI 31.60 kg/m   Physical Exam  Constitutional: He is oriented to person, place, and time. He appears  well-developed. He is active and cooperative.  Non-toxic appearance.  Eyes: Pupils are equal, round, and reactive to light. EOM are normal.  Cardiovascular: Normal rate, regular rhythm, S1 normal, S2 normal, normal heart sounds, intact distal pulses and normal pulses. Exam reveals no gallop and no friction rub.  No murmur heard. Pulmonary/Chest: Effort normal. No stridor. No tachypnea. No respiratory distress. He has no wheezes. He has no rales.  Abdominal: Soft. Bowel sounds are normal. He exhibits no distension and no mass. There is no tenderness. There is no rebound and no guarding.  Musculoskeletal: He exhibits no edema.  Neurological: He is alert and oriented to person, place, and time. He has normal strength and normal reflexes. He is not disoriented. No cranial nerve deficit or sensory deficit. He exhibits normal muscle tone. Coordination and gait normal.  Skin: Skin is warm and dry. No rash noted. He is not diaphoretic. No erythema. No pallor.  Psychiatric: His behavior is normal.  Vitals reviewed.   Lab Results  Component Value Date   WBC 7.9 09/14/2016   HGB 9.6 (L) 09/14/2016   HCT 30.0 (L) 09/14/2016   MCV 89.8 09/14/2016   PLT 212 09/14/2016    Lab Results  Component Value Date   CREATININE 0.88 09/14/2016   BUN 8 09/14/2016   NA 139 09/14/2016   K 4.0 09/14/2016   CL 105 09/14/2016   CO2 29 09/14/2016    Lab Results  Component Value Date  ALT 18 09/12/2016   AST 17 09/12/2016   ALKPHOS 33 (L) 09/12/2016   BILITOT 0.8 09/12/2016    Lab Results  Component Value Date   TSH 1.213 12/14/2012   Lab Results  Component Value Date   CHOL 199 12/14/2012   HDL 41 12/14/2012   LDLCALC 131 (H) 12/14/2012   TRIG 133 12/14/2012   CHOLHDL 4.9 12/14/2012   No results found for this or any previous visit (from the past 72 hour(s)).  No results found.  ASSESSMENT AND PLAN:  Thomas Santiago was seen today for annual exam.  Diagnoses and all orders for this  visit:  Annual physical exam  Screening for endocrine, nutritional, metabolic and immunity disorder -     Lipid Panel -     Hemoglobin A1c -     POCT CBC  Screening for HIV (human immunodeficiency virus) -     HIV antibody  Screening PSA (prostate specific antigen) -     PSA  Screening for colon cancer -     Cologuard -     Ambulatory referral to Gastroenterology  Screening examination for sexually transmitted disease -     GC/Chlamydia Probe Amp  Other orders -     fluticasone (FLONASE) 50 MCG/ACT nasal spray; Place 2 sprays into both nostrils daily.    The patient is advised to call or return to clinic if he does not see an improvement in symptoms, or to seek the care of the closest emergency department if he worsens with the above plan.   Deliah BostonMichael , MHS, PA-C [facility] 08/04/2017 10:39 AM

## 2017-08-04 NOTE — Patient Instructions (Addendum)
  I sent the Flonase to your pharmacy.  When you are having pain is okay to take Tylenol but nothing else. When you have abdominal pain it is okay to take Zantac 75 mg to 150 mg.  If the pain continues  despite taking Zantac and avoiding NSAIDs then come see me.   IF you received an x-ray today, you will receive an [facility] from [facility]. Please contact [facility] at [phone] with questions or concerns regarding your invoice.   IF you received labwork today, you will receive an invoice from [facility]. Please contact [facility] at [phone] with questions or concerns regarding your invoice.   Our billing staff will not be able to assist you with questions regarding bills from these companies.  You will be contacted with the lab results as soon as they are available. The fastest way to get your results is to activate your My Chart account. Instructions are located on the last page of this paperwork. If you have not heard from us regarding the results in 2 weeks, please contact this office.

## 2017-08-05 LAB — LIPID PANEL
Chol/HDL Ratio: 4.9 ratio (ref 0.0–5.0)
Cholesterol, Total: 206 mg/dL — ABNORMAL HIGH (ref 100–199)
HDL: 42 mg/dL (ref >39)
LDL Calculated: 129 mg/dL — ABNORMAL HIGH (ref 0–99)
Triglycerides: 176 mg/dL — ABNORMAL HIGH (ref 0–149)
VLDL CHOLESTEROL CAL: 35 mg/dL (ref 5–40)

## 2017-08-05 LAB — PSA: Prostate Specific Ag, Serum: 1.6 ng/mL (ref 0.0–4.0)

## 2017-08-05 LAB — HEMOGLOBIN A1C
ESTIMATED AVERAGE GLUCOSE: 114 mg/dL
HEMOGLOBIN A1C: 5.6 % (ref 4.8–5.6)

## 2017-08-05 LAB — GC/CHLAMYDIA PROBE AMP
Chlamydia trachomatis, NAA: NEGATIVE
Neisseria gonorrhoeae by PCR: NEGATIVE

## 2017-08-05 LAB — HIV ANTIBODY (ROUTINE TESTING W REFLEX): HIV Screen 4th Generation wRfx: NONREACTIVE

## 2017-08-09 ENCOUNTER — Encounter: Payer: Self-pay | Admitting: *Deleted

## 2017-10-08 LAB — MICROALBUMIN URINE (EXT)
Creatinine, urine, random (INT/EXT): 16 mg/dL — ABNORMAL LOW (ref 30.0–125.0)
MICROALBUMIN, URINE (EXT): 391.2 mg/L — ABNORMAL HIGH (ref 2.0–20.0)
Microalbumin/Creatinine Ratio Urine (EXT): 2445 mg/g — ABNORMAL HIGH (ref 1.3–30.0)

## 2017-12-17 LAB — MICROALBUMIN URINE (EXT)
Creatinine, urine, random (INT/EXT): 28 mg/dL — ABNORMAL LOW (ref 30.0–125.0)
MICROALBUMIN, URINE (EXT): 454.1 mg/L — ABNORMAL HIGH (ref 2.0–20.0)
Microalbumin/Creatinine Ratio Urine (EXT): 1621.8 mg/g — ABNORMAL HIGH (ref 1.3–30.0)

## 2018-01-19 LAB — HEMOGLOBIN A1C: HEMOGLOBIN A1C % (INT/EXT): 6.4 % — ABNORMAL HIGH (ref 4.0–6.0)

## 2018-01-19 LAB — MICROALBUMIN URINE (EXT)
Creatinine Urine (EXT): 263 mg/L
Microalbumin, Urine (EXT): 1593.2 MG ALB/G CREAT — ABNORMAL HIGH (ref <30)

## 2018-01-19 LAB — UNMAPPED LAB RESULTS: Creatinine Urine (EXT): 249 mg/L

## 2018-04-29 IMAGING — CR DG CHEST 2V
2 series · 2 of 2 positions shown · non-contrast
Comparison: None.

CLINICAL DATA: Dizziness

EXAM:
CHEST  2 VIEW

[chest pa]
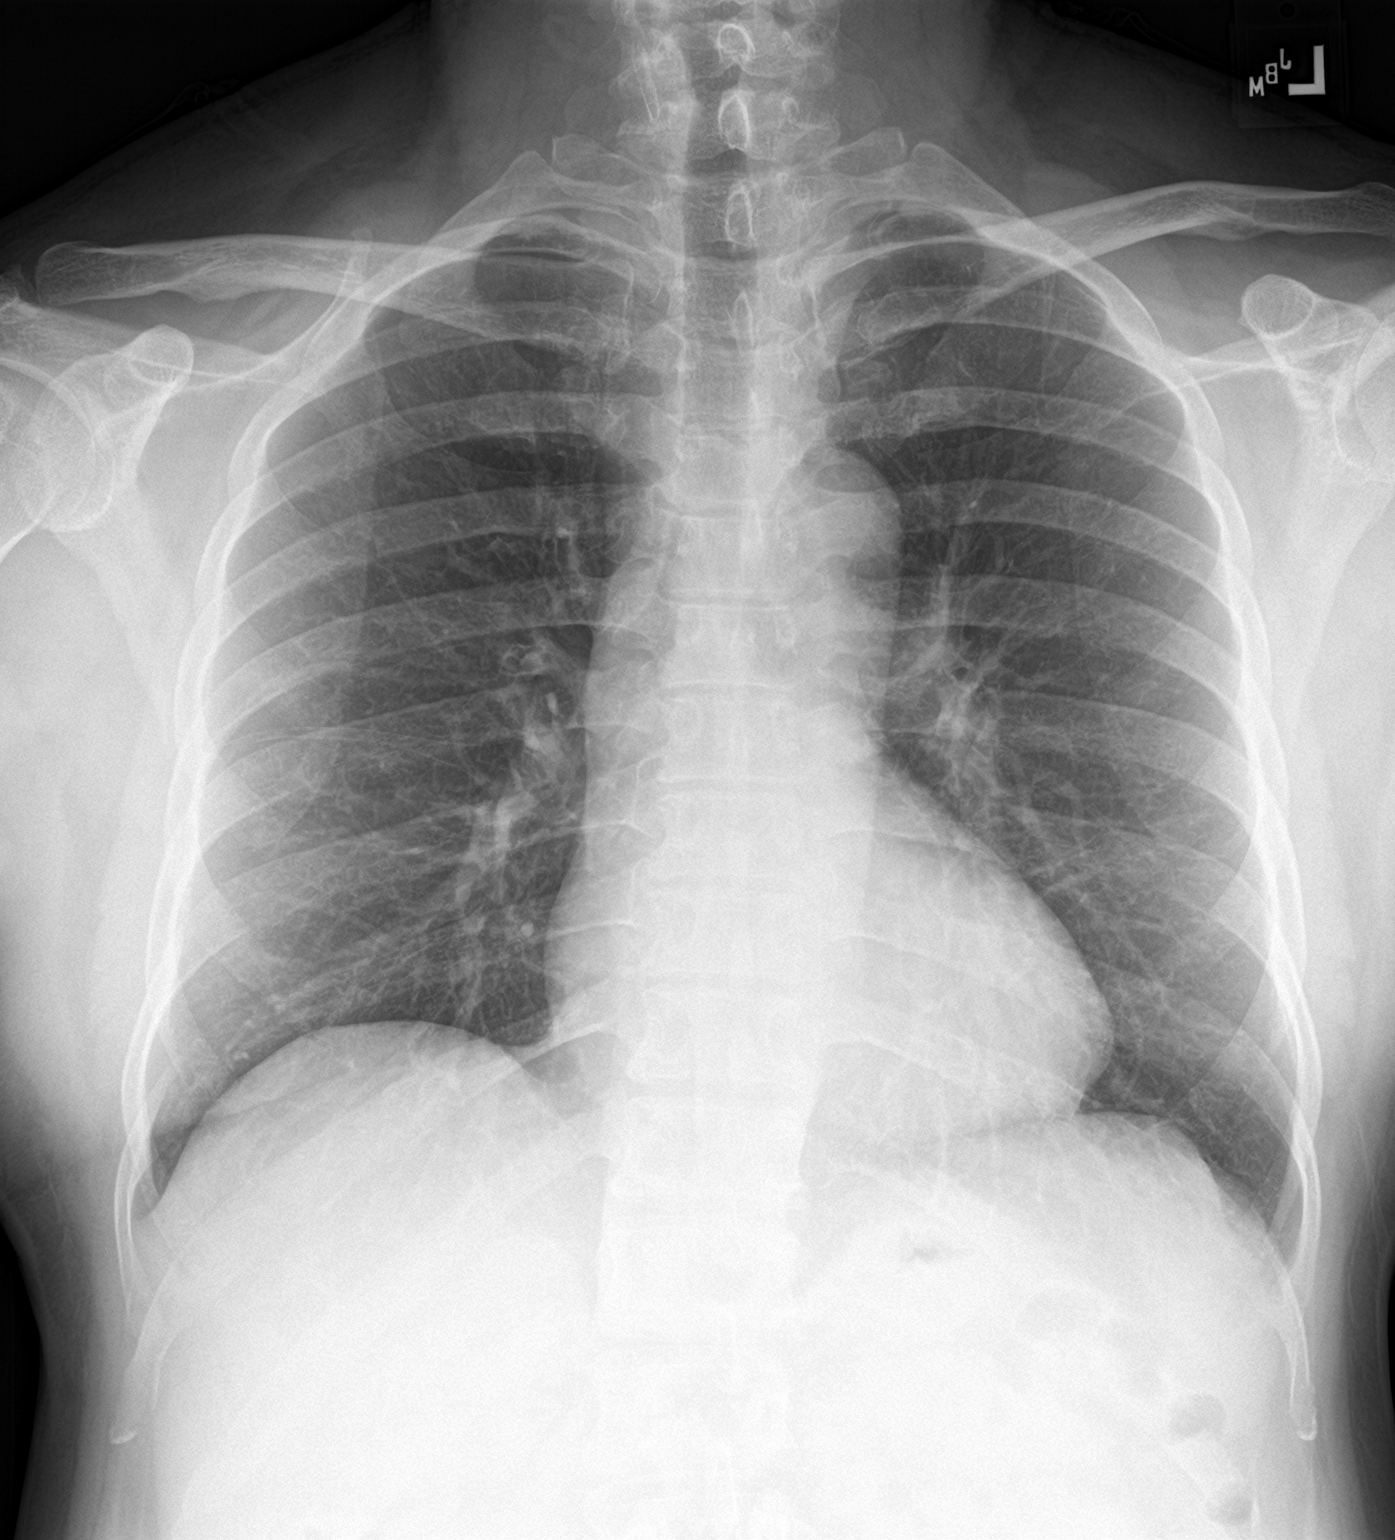

[chest lat]
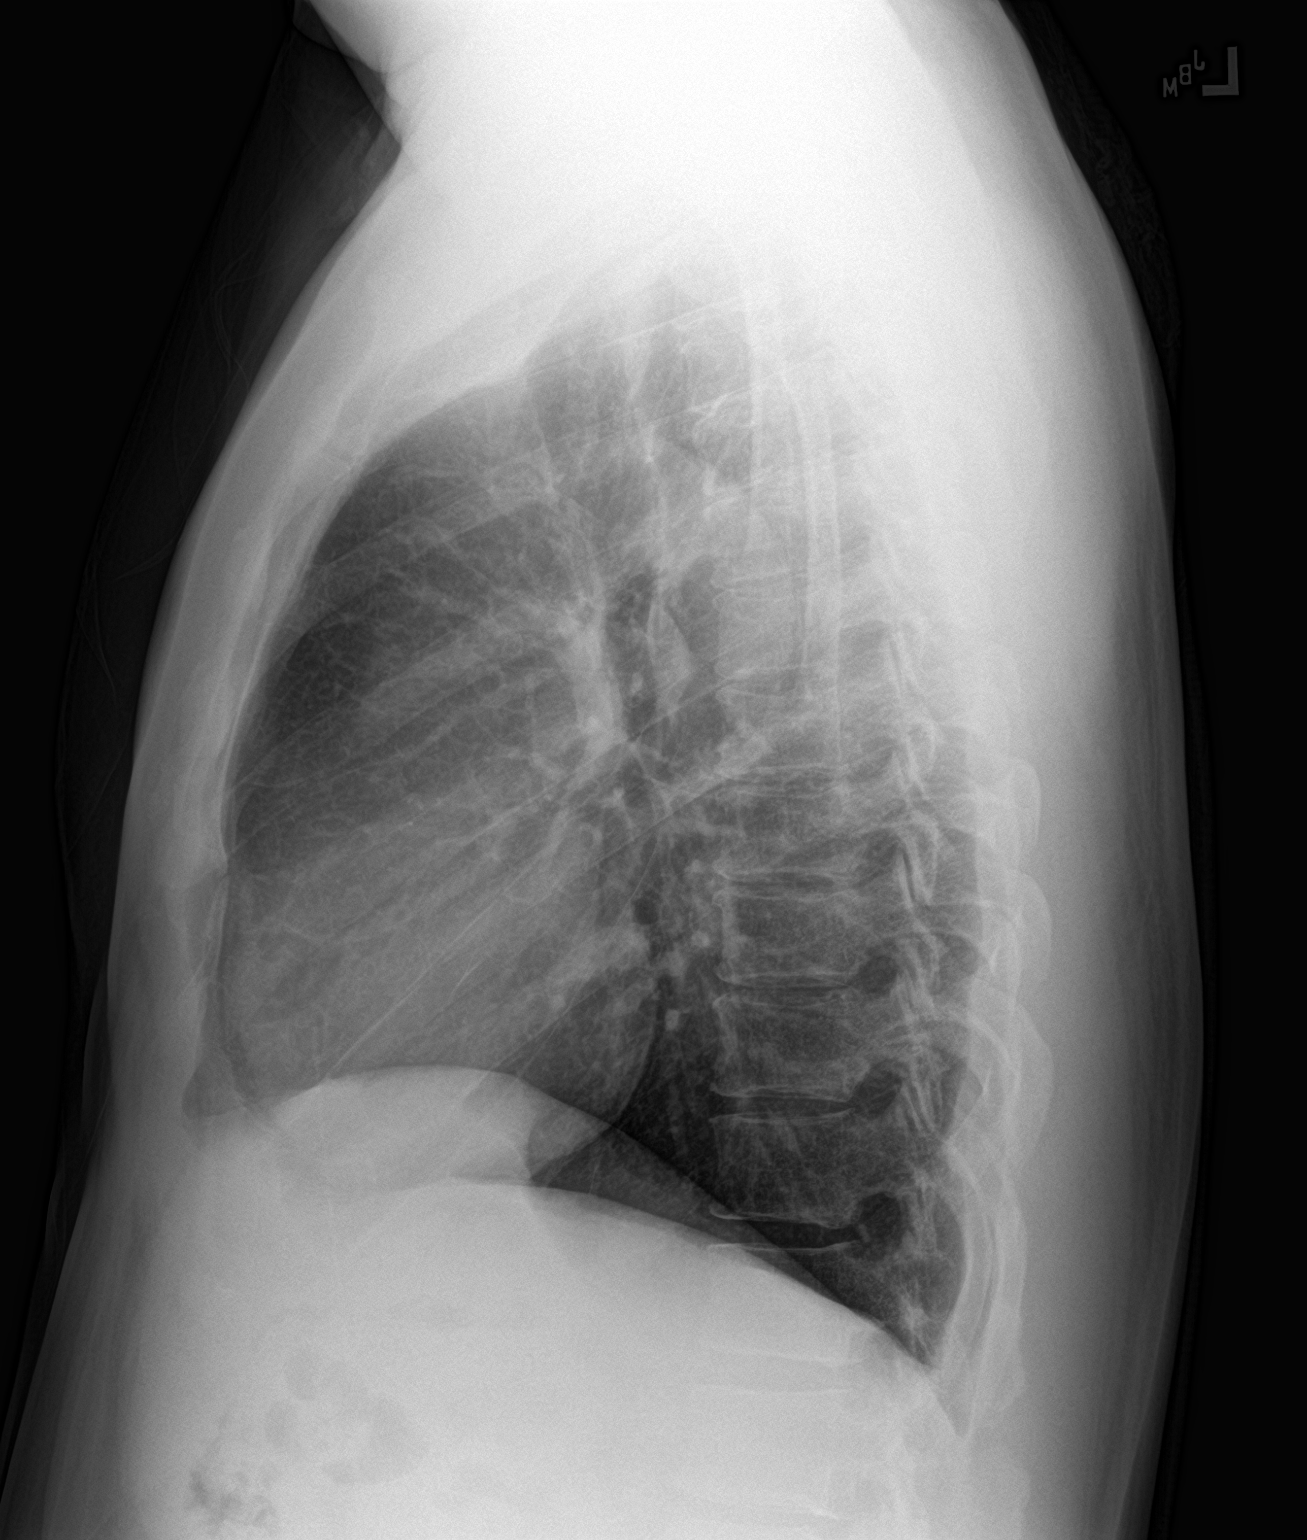

[2 of 2 positions shown; findings below may reference images not displayed]

FINDINGS: Normal heart size. Normal mediastinal contour. No pneumothorax. No
pleural effusion. Lungs appear clear, with no acute consolidative
airspace disease and no pulmonary edema.
IMPRESSION: No active cardiopulmonary disease.

## 2018-05-23 LAB — MICROALBUMIN URINE (EXT)
Creatinine Urine (EXT): 186 mg/L
Microalbumin, Urine (EXT): 2553.8 MG ALB/G CREAT — ABNORMAL HIGH (ref <30)

## 2018-05-23 LAB — UNMAPPED LAB RESULTS: Creatinine Urine (EXT): 172 mg/L

## 2019-03-11 LAB — LIPID PROFILE (EXT)
Cholesterol (EXT): 234 mg/dL — ABNORMAL HIGH (ref 0–200)
HDL Cholesterol (EXT): 51 mg/dL (ref 40–60)
LDL Cholesterol (EXT): 136.7 mg/dL — ABNORMAL HIGH (ref 0.0–99.0)
Triglycerides (EXT): 278 mg/dL — ABNORMAL HIGH (ref 15–150)

## 2019-05-06 LAB — MICROALBUMIN URINE (EXT)
Creatinine, urine, random (INT/EXT): 17 mg/dL — ABNORMAL LOW (ref 30.0–125.0)
MICROALBUMIN, URINE (EXT): >2000 mg/L — ABNORMAL HIGH (ref 2.0–20.0)

## 2019-05-11 ENCOUNTER — Observation Stay
Admission: EM | Admit: 2019-05-11 | Discharge: 2019-05-19 | Disposition: A | Discharge status: Home Health | Payer: Medicare Other | Source: Intra-hospital

## 2019-05-11 ENCOUNTER — Emergency Department
Admit: 2019-05-11 | Disposition: A | Discharge status: Other Acute Inpatient Hospital | Source: Ambulatory Visit | Attending: Emergency Medicine | Admitting: Emergency Medicine

## 2019-05-11 ENCOUNTER — Inpatient Hospital Stay
Admit: 2019-05-11 | Disposition: A | Discharge status: Home Health | Source: Emergency Department | Attending: Internal Medicine | Admitting: Internal Medicine

## 2019-05-11 LAB — HX HEM-ROUTINE
HX BASO #: 0.1 10*3/uL (ref 0.0–0.2)
HX BASO: 1 %
HX EOSIN #: 0.2 10*3/uL (ref 0.0–0.5)
HX EOSIN: 2 %
HX HCT: 56.4 % — ABNORMAL HIGH (ref 37.0–47.0)
HX HGB: 17.9 g/dL — ABNORMAL HIGH (ref 13.5–16.0)
HX IMMATURE GRANULOCYTE#: 0 10*3/uL (ref 0.0–0.1)
HX IMMATURE GRANULOCYTE: 0 %
HX LYMPH #: 2 10*3/uL (ref 1.0–4.0)
HX LYMPH: 22 %
HX MCH: 31.7 pg (ref 26.0–34.0)
HX MCHC: 31.7 g/dL — ABNORMAL LOW (ref 32.0–36.0)
HX MCV: 99.8 fL — ABNORMAL HIGH (ref 80.0–98.0)
HX MONO #: 0.8 10*3/uL (ref 0.2–0.8)
HX MONO: 9 %
HX MPV: 9.7 fL (ref 9.1–11.7)
HX NEUT #: 6.4 10*3/uL (ref 1.5–7.5)
HX NRBC #: 0 10*3/uL
HX NUCLEATED RBC: 0 %
HX PLT: 201 10*3/uL (ref 150–400)
HX RBC BLOOD COUNT: 5.65 M/uL — ABNORMAL HIGH (ref 4.20–5.50)
HX RDW: 12.5 % (ref 11.5–14.5)
HX SEG NEUT: 67 %
HX WBC: 9.6 10*3/uL (ref 4.0–11.0)

## 2019-05-11 LAB — HX CHEM-PANELS
HX ANION GAP: 8 (ref 3–14)
HX BLOOD UREA NITROGEN: 21 mg/dL (ref 6–24)
HX CHLORIDE (CL): 99 meq/L (ref 98–110)
HX CO2: 33 meq/L — ABNORMAL HIGH (ref 20–30)
HX CREATININE (CR): 1.4 mg/dL — ABNORMAL HIGH (ref 0.57–1.30)
HX GFR, AFRICAN AMERICAN: 65 mL/min/{1.73_m2}
HX GFR, NON-AFRICAN AMERICAN: 56 mL/min/{1.73_m2} — ABNORMAL LOW
HX GLUCOSE: 184 mg/dL — ABNORMAL HIGH (ref 70–139)
HX POTASSIUM (K): 5.1 meq/L (ref 3.6–5.1)
HX SODIUM (NA): 140 meq/L (ref 135–145)

## 2019-05-11 LAB — HX CHEM-LFT
HX ALANINE AMINOTRANSFERASE (ALT/SGPT): 15 IU/L (ref 0–54)
HX ALKALINE PHOSPHATASE (ALK): 137 IU/L — ABNORMAL HIGH (ref 40–130)
HX ASPARTATE AMINOTRANFERASE (AST/SGOT): 18 IU/L (ref 10–42)
HX BILIRUBIN, DIRECT: 0.1 mg/dL (ref 0.0–0.5)
HX BILIRUBIN, TOTAL: 0.2 mg/dL (ref 0.2–1.1)
HX LACTATE DEHYDROGENASE (LDH): 272 IU/L — ABNORMAL HIGH (ref 120–220)

## 2019-05-11 LAB — HX CHEM-OTHER: HX ALBUMIN: 3.3 g/dL — ABNORMAL LOW (ref 3.4–4.8)

## 2019-05-11 LAB — HX BF-URINALYSIS
HX HYALINE CAST: 0 /LPF
HX KETONES: NEGATIVE mg/dL
HX LEUKOCYTE ES: NEGATIVE
HX NITRITE LEVEL: NEGATIVE
HX RED BLOOD CELLS: 2 /HPF
HX SPECIFIC GRAVITY: 1.009
HX SQUAMOUS EPITHELIAL CELLS: NEGATIVE /HPF
HX U BACTERIA: NEGATIVE
HX U BILIRUBIN: NEGATIVE
HX U BLOOD: NEGATIVE
HX U PH: 6.5
HX U UROBILINIG: 0.2 EU
HX WHITE BLOOD CELLS: <1 /HPF

## 2019-05-11 LAB — HX MICRO-RESP VIRAL PANEL: HX COVID-19 (SARS-COV-2) RAPID: NEGATIVE

## 2019-05-11 LAB — HX CHEM-ENZ-FRAC
HX B NATRIURETIC PEPTIDE (BNP): 224 pg/mL — ABNORMAL HIGH (ref 0–100)
HX TROPONIN I: 0.06 ng/mL — ABNORMAL HIGH (ref 0.00–0.03)
HX TROPONIN I: 0.05 ng/mL — ABNORMAL HIGH (ref 0.00–0.03)

## 2019-05-11 LAB — HX POINT OF CARE
HX GLUCOSE-POCT: 196 mg/dL — ABNORMAL HIGH (ref 70–139)
HX GLUCOSE-POCT: 252 mg/dL — ABNORMAL HIGH (ref 70–139)

## 2019-05-11 LAB — HX DIABETES: HX GLUCOSE: 184 mg/dL — ABNORMAL HIGH (ref 70–139)

## 2019-05-11 LAB — HX TRANSFUSION

## 2019-05-11 LAB — HX CHEM-METABOLIC: HX THYROID STIMULATING HORMONE (TSH): 1.97 u[IU]/mL (ref 0.35–4.94)

## 2019-05-11 NOTE — ED Provider Notes (Signed)
 .  .  Name: Arthur Young, Arthur Young  MRN: [id number]  Age: 53 yrs  Sex: Male  DOB: 14-Dec-1965  Arrival Date: 05/11/2019  Arrival Time: 12:55  Account#: [id number]  Bed Pending Adult  PCP: Berenice Primas  Chief Complaint:  - Fatigue and cough  .  Presentation:  12/31  12:57 Presenting complaint: EMS states: Fatigue/Cough/constipation:   km31        Per EMS staff at group home called for increasing        fatigue/cough/constipation x 1 month worsening over last few        days. Upon EMS arrival pt was found waiting outside, in no        distress O2 sat 87% on RA. HX of COPD. Facility is COVID        negative at this time per staff. Pt is [place] speaking.  13:01 Method Of Arrival: EMS: [facility]                              km31  13:01 Acuity: Adult 3                                                 km31  .  Historical:  - Allergies:  14:40 No known drug Allergies;                                        aa38  14:40 No known Allergies;                                             aa38  - Home Meds:  13:04 Cozaar Oral [Active]; Epitol oral [Active]; Seroquel Oral       km31        [Active]; ProAir HFA Inhl [Active]; Carbamazepine Oral [Active];  - PMHx:  13:04 COPD; Glaucoma; Cataract; Hypertension;                         km31  .  - The history from nurses notes was reviewed: and I agree with    what is documented.  .  .  Screening:  13:04 SEPSIS SCREENING Does this patient have a suspected source of   km31        infection at this timequestion No SIRS Criteria (> = 2) No. Fall Risk        None identified. Exposure Risk/Travel Screening: COVID        Symptomsquestion Cough. Known COVID 19 exposurequestion No. DPH requests        Isolationquestion(COVID) No. Have you tested + for COVIDquestion No.  .  Vital Signs:  13:04 BP 112 / 77; Pulse 107; Resp 20; Temp 37.3; Pulse Ox 97% on 2   km31        lpm NC;  14:00 Pulse 102; Resp 22; Pulse Ox 96% on 2 lpm NC;                   km31  15:00 Pulse 101; Resp 20; Pulse Ox 95% on 2 lpm NC;  km31  16:30 BP 118 / 72; Pulse 103; Resp 22; Pulse Ox 96% on 2 lpm NC;      km31  .  Glasgow Coma Score:  13:04 Eye Response: spontaneous(4). Verbal Response: oriented(5).     km31        Motor Response: obeys commands(6). Total: 15.  .  Name:Arthur Young, Arthur Young  [id number]  [id number]  Page 1 of 4  %%PAGE  .  Name: Arthur Young, Arthur Young  MRN: [id number]  Age: 63 yrs  Sex: Male  DOB: 10/23/1965  Arrival Date: 05/11/2019  Arrival Time: 12:55  Account#: [id number]  Bed Pending Adult  PCP: Berenice Primas  Chief Complaint:  - Fatigue and cough  .  .  Triage Assessment:  13:04 General: Appears in no apparent distress, well nourished,       km31        Behavior is appropriate for age, cooperative. Pain: Denies        pain. Neuro: Eye opening: Spontaneously Level on consciousness:        Sustained Attention Verbal Response: Orientation: Oriented x 3        Speech: Clear. Eye movements: No Gaze preference Facial Droop:        Appears Normal. Cardiovascular: No deficits noted. Respiratory:        Airway is patent Respiratory effort is even, unlabored,        Respiratory pattern is regular. GI: Denies diarrhea, nausea,        vomiting. Skin: Skin is intact, Skin is pink, warm / dry.  .  Assessment:  13:07 General: Assessment done at time of triage by this RN,          km31        [place] interpreter requested.  16:32 General: Dan Nurse care partner from [facility]; states patient   ac21        refuses follow up with lung issues, GI, prostate ect. delusion        that particles in the air will cause him to have a respiratory        event that will cause him to die. has had increased anxiety due        to covid. not always agreeable to treatment [phone] .  [name]  Observations:  12:55 Patient arrived in ED.                                          km31  13:03 Triage Completed.                                               km31  13:08 Patient Visited By: Nelwyn Salisbury                            km31  13:12 Patient Visited By:  Greer Ee                       [phone]  13:43 Patient Visited By: Nelwyn Salisbury                            km31  14:05 Patient Visited By: Greer Ee  [id number]  14:23 Patient Visited By: Elba Barman                             bs  14:23 Registration completed.                                         bs  14:48 Patient Visited By: Miachel Roux                               mm10  15:38 Patient assigned to A4                                          ac21  16:05 Patient assigned to A4                                          ac21  16:44 Patient Visited By: Shann Medal  .  Procedure:  13:43 Inserted peripheral IV: 20 gauge in right antecubital area.     km31  14:36 EKG done. (by ED staff). No Old EKG Reviewed By: Meryle Ready        MD.  14:51 Blood Culture X 1 Sent.                                         km31  16:11 UA (Urinalysis) Sent.                                           km31  .  [name]  Name:Arthur Young, Arthur Young  [id number]  [id number]  Page 2 of 4  %%PAGE  .  Name: Arthur Young, Arthur Young  MRN: [id number]  Age: 29 yrs  Sex: Male  DOB: Dec 30, 1965  Arrival Date: 05/11/2019  Arrival Time: 12:55  Account#: [id number]  Bed Pending Adult  PCP: Berenice Primas  Chief Complaint:  - Fatigue and cough  .  Dispensed Medications:  15:07 Drug: NS - Sodium Chloride 0.9% IV ml 1000 mL Route: IV; Rate:  km31        150 mL/h;  15:07 Drug: predniSONE 40 mg Route: PO;                               km31  16:43 Follow up: Response: No Adverse Reaction                        km31  15:07 Drug: Albuterol HFA Inhaler 4 puffs Route: Inhalation;          km31  16:43 Follow up: Response: No Adverse Reaction  km31  16:11 Drug: ASA - Aspirin 325 mg Route: PO;                           km31  16:43 Follow up: Response: No Adverse Reaction                        km31  .  [name]  Interventions:  13:19 EMS Sheet Scanned into Chart                                     kd14  13:20 Outside Patient Records Scanned into Chart                      kd14  14:34 Demo Sheet Scanned into Chart                                   kd14  14:38 ECG/EKG Scanned into Chart                                      kd14  17:14 Other: TICKET TO RIDE Scanned into Chart                        dw  .  Outcome:  15:33 Decision to Hospitalize by Provider.                            ZO10  17:17 Admitted to [facility] accompanied by tech, via stretcher, with   km31        chart. Condition: good.  17:34 Admitted to [facility].                                           kb30  17:35 Patient left the ED.                                            kb30  .  Corrections: (The following items were deleted from the chart)  13:03 12:57 Presenting complaint: EMS states:                         km31        Fatigue/Cough/constipation: Per EMS staff at group home called        for increasing fatigue/cough/constipation x 1 month worsening        over last few days. HX of COPD. Facility is COVID negative at        this time. km31  13:03 12:57 Presenting complaint: EMS states:                         km31        Fatigue/Cough/constipation: Per EMS staff at group home called        for increasing fatigue/cough/constipation x 1 month worsening  over last few days. Upon EMS arrival pt was found waiting        outside, in no distress O2 sat 87% on RA. HX of COPD. Facility        is COVID negative at this time per staff. km31  13:06 13:03 Allergies: No known Allergies; km31                       km31  14:41 13:04 Allergies: Unable to obtain; km31                         aa38  16:35 16:32 General: Jesusita Oka Nurse care partner from [place]; states     ac21        patient refuses follow up with lung issues, GI, prostate ect.        delusion that particles in the air will cause him to have a  .  Name:Arthur Young, Arthur Young  [id number]  [id number]  Page 3 of 4  %%PAGE  .  Name: Arthur Young, Arthur Young  MRN: [id number]  Age: 1 yrs  Sex: Male  DOB:  06/21/1965  Arrival Date: 05/11/2019  Arrival Time: 12:55  Account#: [id number]  Bed Pending Adult  PCP: Berenice Primas  Chief Complaint:  - Fatigue and cough  .        respiratory event that will cause him to die. has had increased        anxiety due to covid. [name] ac21  16:37 16:32 General: Jesusita Oka Nurse care partner from [place]; states     ac21        patient refuses follow up with lung issues, GI, prostate ect.        delusion that particles in the air will cause him to have a        respiratory event that will cause him to die. has had increased        anxiety due to covid. not always agreeable to treatment . ac21  .  Signatures:  [place], Bernice                         Reg  bs  [place], [place]                                  dw  [place], Matt                           DO   mm10  [place], [place]                           RN   ac21  Nelwyn Salisbury                        RN   [address], [place]                          kb30  Hal Morales                   MD  ZO10  Kipp Laurence                        Sec  [phone]  .  .  .  .  .  .  .  .  .  .  .  .  .  .  .  .  .  .  .  .  .  .  .  .  .  Name:Arthur Young, Arthur Young  [id number]  [id number]  Page 4 of 4  .  %%END

## 2019-05-11 NOTE — ED Provider Notes (Signed)
 [name]  Name: Arthur, Young  MRN: [id number]  Age: 53 yrs  Sex: Male  DOB: 05-27-1965  Arrival Date: 05/11/2019  Arrival Time: 12:55  Account#: [id number]  .  Working Diagnosis: Subsequent non-ST elevation (NSTEMI) myocardial infarcti  on,  COPD/ Chronic obstructive pulmonary disease with (acute) exacerbation  PCP: Berenice Primas  .  HPI:  12/31  14:30 This 53 yrs old Asian Male presents to ER via EMS with          aa38        complaints of Fatigue and cough.  14:30 53 y/o m, with PMHx of COPD, Glaucoma, cataract, HTN and        mm10/  kn13        unspecified psychiatric illness who was BIBA from his group        home complaining of E SOB, dry cough, lower limb swelling,        intermittens chest pain for the past 3-4 weeks. SOB comes with        exertion for few steps, could not specify the exact range.        Patient also state that he uses 2 pillows to sleep. No fever,        no chills, no diarrhea, no PND. pt still smokes 1ppd per day. .  .  Historical:  - Allergies: No known drug Allergies; No known Allergies;  - Home Meds: Cozaar Oral; Epitol oral; Seroquel Oral; ProAir HFA Inhl;    Carbamazepine Oral;  - PMHx: COPD; Glaucoma; Cataract; Hypertension;  - The history from nurses notes was reviewed: and I agree with    what is documented.  .  .  ROS:  14:40 Constitutional: Negative for chills, fatigue, fever.            MV78  14:40 Cardiovascular: Positive for chest pain, edema, orthopnea,        Negative for paroxysmal nocturnal dyspnea.  14:40 Respiratory: Positive for cough, dyspnea on exertion,        orthopnea, wheezing.  14:40 Abdomen/GI: Positive for constipation, Negative for abdominal        pain, nausea, vomiting.  14:40 GU: Positive for difficulty urinating, Negative for flank pain,        costovertebral angle tenderness, burning with urination, foul        smelling urine.  .  Vital Signs:  13:04 BP 112 / 77; Pulse 107; Resp 20; Temp 37.3; Pulse Ox 97% on 2   km31        lpm NC;  14:00 Pulse 102; Resp 22; Pulse  Ox 96% on 2 lpm NC;                   km31  15:00 Pulse 101; Resp 20; Pulse Ox 95% on 2 lpm NC;                   km31  16:30 BP 118 / 72; Pulse 103; Resp 22; Pulse Ox 96% on 2 lpm NC;      km31  .  Glasgow Coma Score:  13:04 Eye Response: spontaneous(4). Verbal Response: oriented(5).     km31  .  Name:Arthur Young, Arthur Young  [id number]  [id number]  Page 1 of 6  %%PAGE  .  Name: Arthur Young, Arthur Young  MRN: [id number]  Age: 40 yrs  Sex: Male  DOB: 11-17-1965  Arrival Date: 05/11/2019  Arrival Time: 12:55  Account#: [id number]  .  Working Diagnosis: Subsequent non-ST elevation (  NSTEMI) myocardial infarcti  on,  COPD/ Chronic obstructive pulmonary disease with (acute) exacerbation  PCP: Berenice Primas  .        Motor Response: obeys commands(6). Total: 15.  .  Exam:  15:05 Abdomen/GI:  Soft, non-tender, with normal bowel sounds.  No    aa38        distension or tympany.  No guarding or rebound.  No evidence of        tenderness throughout.  15:05 Constitutional: The patient appears well nourished, alert, well        developed, awake, obese, uncomfortable.  15:05 Head/face: Exam is negative for obvious evidence of injury or        deformity, abrasion(s), battle signs.  15:05 ENT: Exam is negative for  15:05 Respiratory: Exam negative for  accessory muscles, moderate        respiratory distress is noted,  Respirations: no acute changes,        that is moderate is noted, Breath sounds: wheezing, bronchial        sounds, are heard in the  left posterior lower lobe and right        posterior lower lobe.  15:05 Cardiovascular: Exam negative for  bradycardia, JVD, murmur,        Edema: 3+ edema to level of left ankle, left foot, left toes,        right ankle, right foot and right toes.  .  MDM:  15:33 Counseling: I had a detailed discussion with the patient and/or aa38        guardian regarding: the historical points, exam findings, and        any diagnostic results supporting the admission diagnosis.  15:33 Data reviewed: EKG, lab test  result(s), vital signs.            ZO10  15:34 Data interpreted: Pulse oximetry: on 3L(s) per nasal canula.    RU04  15:34 ED course: pt came with cough, fatigue, LL swelling. on exam:+  aa38        wheezing, 88 % sat on RA and 93 on 3 L nasal canula. + LL        petting edema + 3 BL. scattered inspiratory crackles and        wheezing. Cr and BNP is high. 1st trops came 0.06 and EKG is        unremarkable for acute ischemia. Hear score of 4. We gave 1 L        NS 150 cc per hour. Prednisone 40 and albuterol 4 puffs. pt        will be admitted under Pulmo for more eval and management. .  15:45 ED course: loading dose of aspirin. [name]                           VW09  15:45 Resident chart complete and electronically signed: Burtis Junes MD.  .  12/31  13:43 Order name: COVID-19 PCR (Symptomatic patients)                 aa38  12/31  13:43 Order name: BUN (Blood Urea Nitrogen); Complete Time: 14:49     aa38  12/31  .  Name:Arthur Young, Arthur Young  [id number]  [id number]  Page 2 of 6  %%PAGE  .  Name: Arthur Young, Arthur Young  MRN: [id number]  Age: 79 yrs  Sex: Male  DOB: 1965-08-16  Arrival Date: 05/11/2019  Arrival Time: 12:55  Account#: [id number]  .  Working Diagnosis: Subsequent non-ST elevation (NSTEMI) myocardial infarcti  on,  COPD/ Chronic obstructive pulmonary disease with (acute) exacerbation  PCP: Berenice Primas  .  13:43 Order name: CBC/Diff (With Plt); Complete Time: 14:49           aa38  12/31  13:43 Order name: CR (Creatinine); Complete Time: 14:49               aa38  12/31  13:43 Order name: GLU (Glucose); Complete Time: 14:49                 aa38  12/31  13:43 Order name: LYTES (Na, K, Cl, Co2); Complete Time: 14:49        aa38  12/31  13:43 Order name: Blood Culture X 1                                   aa38  12/31  13:59 Order name: Blood Bank Hold; Complete Time: 14:49               dispa  t  12/31  14:16 Order name: GFR, AA; Complete Time: 14:49                       dispa  t  12/31  13:23 Order name:  Monitor; Complete Time: 14:43                       aa38  12/31  13:43 Order name: Dx Chest (Portable)                                 aa38  12/31  13:59 Order name: ADD TROPONIN I; Complete Time: 14:11                aa38  12/31  14:02 Order name: ADD BNP-BRAIN NATRIURETIC PROTEIN; Complete Time:   VH84        14:11  12/31  14:12 Order name: Adult EKG (order using folder); Complete Time: [id number]  12/31  14:12 Order name: EKG (order using folder); Complete Time: 14:42      aa38  12/31  14:16 Order name: GFR, NAA; Complete Time: 14:49                      dispa  t  12/31  14:31 Order name: COVID-19 (SARS-CoV-2) PCR RAPID; Complete Time:     dispa  t        14:49  12/31  14:35 Order name: Troponin I; Complete Time: 14:49                    dispa  t  12/31  15:28 Order name: BNP                                                 dispa  t  12/31  15:30 Order name: UA (Urinalysis)  ZO10  12/31  16:29 Order name: Urine Microscopic                                   dispa  t  .  Dispensed Medications:  15:07 Drug: NS - Sodium Chloride 0.9% IV ml 1000 mL Route: IV; Rate:  km31  .  Name:Arthur Young, Arthur Young  [id number]  [id number]  Page 3 of 6  %%PAGE  .  Name: Arthur Young, Arthur Young  MRN: [id number]  Age: 78 yrs  Sex: Male  DOB: [birth year]  Arrival Date: 05/11/2019  Arrival Time: 12:55  Account#: [id number]  .  Working Diagnosis: Subsequent non-ST elevation (NSTEMI) myocardial infarcti  on,  COPD/ Chronic obstructive pulmonary disease with (acute) exacerbation  PCP: Berenice Primas  .        150 mL/h;  15:07 Drug: predniSONE 40 mg Route: PO;                               km31  16:43 Follow up: Response: No Adverse Reaction                        km31  15:07 Drug: Albuterol HFA Inhaler 4 puffs Route: Inhalation;          km31  16:43 Follow up: Response: No Adverse Reaction                        km31  16:11 Drug: ASA - Aspirin 325 mg Route: PO;                           km31  16:43 Follow up: Response: No  Adverse Reaction                        km31  .  [name]  Radiology Orders:  Order Name: Dx Chest (Portable); Last Status: Returned; Time:    05/11/19 13:43; By: [id number]; For: aa38; Order Method:    Electronic; Notes: Bed Name: A4  Attending Notes:  15:40 Attestation: Assessment and care plan reviewed with             mm10/  kn13        resident/midlevel provider. See their note for details.        Resident's history reviewed, patient interviewed and examined.        Attending HPI: Social History: lives at group home HPI: 53 y/o        male patient with PMHx COPD, Glaucoma, cataract, HTN and        unspecified psychiatric illness who was BIBA from his group        home presents to the ED for evaluation of fatigue and dry cough        x 1 month. He endorses swelling of lower extremities,        intermittent chest pain x 3-4 weeks. Endorses SOB with        exertion. Denies fevers/chills, N/V/D, abdominal pain, urinary        symptoms or any other symptoms. Attending ROS Constitutional:        No fever. Eyes: no visual complaint ENT: no hearing loss ,        runny nose or sore throat Neck: No neck pain Abdomen/GI:  No abd        pain, nausea or vomiting Young: No Young pain GU: No urinary        frequency. Skin: No rash Neuro: No weakness or numbness Psych:        NO si or plan no AV hallucination Cardiovascular: Positive for        chest pain, Respiratory: Positive for cough, shortness of        breath, SOB with exertion , MS/Extremity: Positive for        swelling, of the loewr extremities . Attending Exam: My        personal exam reveals To be filled in by Dr. Metro Kung. I have        reviewed Nurses Notes. ED Course: 53 y/o male patient presents        to the ED for evaluation of fatigue and dry cough x 1 month.        Given patients troponin is positive, EKG is non diagnostic,        CXR is most consistent with COPD, we will admit the patient to        the Medical Service for further management and care. Patient        has no  other medical emergency at this time and is in stable        condition. He agreed and understood his admission plans and was        happy with his over all care.Patient is admitted with all  .  Name:Arthur Young, Arthur Young  [id number]  [id number]  Page 4 of 6  %%PAGE  .  Name: Arthur Young, Arthur Young  MRN: [id number]  Age: 48 yrs  Sex: Male  DOB: 03-22-1966  Arrival Date: 05/11/2019  Arrival Time: 12:55  Account#: [id number]  .  Working Diagnosis: Subsequent non-ST elevation (NSTEMI) myocardial infarcti  on,  COPD/ Chronic obstructive pulmonary disease with (acute) exacerbation  PCP: Berenice Primas  .        question answered.  15:47 Lab/Ancillary show: Labs were reviewed and interpreted by me.   mm10/  kn13        All normal except: Glucose 184, Creatinine 1.40, GFR NAA 56,        CO2 33, BNP 224, Troponin I 0.06, Xray(s) visualized and        interpreted by me CXR shows: no evidence of pleural effusion or        pneumothorax.  15:48 My Working Impression: 1. NSTEMI MI 2. COPD. Scribe Scribe      mm10/  kn13        Chart Complete By signing my name below, Arthur Young, attest        that this documentation has been prepared under the direction        and in the presence of Dr. Metro Kung, D.O. Electronically signed,        Arthur Young, ED Scribe, 05/11/2019.  [name]  Disposition Summary:  05/11/19 15:33  Hospitalization Ordered        Hospitalization Status: Observation                             aa38        Provider: Ballard Russell  [id number]        Location: Adult Floor                                           aa38        Condition: Stable                                               aa38        Problem: new                                                    aa38        Symptoms: are unchanged                                         aa38        Bed/Room Type: Regular                                          aa38        Room Assignment: [facility] 7(05/11/19 16:05)                       ac21        Diagnosis           - Subsequent non-ST elevation (NSTEMI) myocardial infarction  aa38          - COPD/ Chronic obstructive pulmonary disease with (acute)    aa38        exacerbation        Forms:          - Art therapist Form                                  [phone]          - Fax Summary                                                 [phone]  Signatures:  Dispatcher, [employer]                          dispa  Soohoo, Bernice                         Reg  bs  Mostofi, Matt                           DO   mm10  Fontana, Amy  RN   Jerolyn Center  Nelwyn Salisbury                        RN   km31  Arthur Young, Scribe                      Elissa Lovett  Arthur Young                   MD   aa38  Kipp Laurence                        Sec  [phone]  .  Corrections: (The following items were deleted from the chart)  13:06 13:03 Allergies: No known Allergies; km31                       km31  .  Name:Arthur Young, Arthur Young  [id number]  [id number]  Page 5 of 6  %%PAGE  .  Name: Arthur Young, Arthur Young  MRN: [id number]  Age: 73 yrs  Sex: Male  DOB: 03/05/1966  Arrival Date: 05/11/2019  Arrival Time: 12:55  Account#: [id number]  .  Working Diagnosis: Subsequent non-ST elevation (NSTEMI) myocardial infarcti  on,  COPD/ Chronic obstructive pulmonary disease with (acute) exacerbation  PCP: Berenice Primas  .  14:41 13:04 Allergies: Unable to obtain; km31                         aa38  14:54 13:43 Blood Urea Nitrogen +Chemistry ordered. dispat            dispa  t  14:55 13:43 CBC/Diff (With Plt)+Hematology ordered. dispat            dispa  t  14:55 13:43 Creatinine (CR)+Chemistry ordered. dispat                 dispa  t  14:56 13:43 Glucose+Chemistry ordered. dispat                         dispa  t  14:56 13:43 Electrolytes (Na, K, Cl, Co2)+Chemistry ordered. dispat   dispa  t  15:38 15:33 aa38                                                      ac21  15:40 14:30 53 y/o m, with PMHx of COPD, Glaucoma, cataract, HTN and  mm10/  kn13        unspecified  psychiatric illness who was BIBA from his group        home complaining of E SOB, dry cough, lower limb swelling,        intermittens chest pain for the past 3-4 weeks. SOB comes with        exertion for few steps, could not specify the exact range.        Patient also state that he uses 2 pillows to sleep. No fever,        no chills, no diarrhea, no PND. pt still smokes 1ppd per day. [name]        OZ36  16:05 15:38 *PENDING BED* ac21  ac21  .  Document is preliminary until electronically or manually signed by the atte  nding physician  .  .  .  .  .  .  .  .  .  .  .  .  .  .  .  .  .  .  .  .  .  .  .  .  .  Name:Greff, Yehya  [id number]  [id number]  Page 6 of 6  .  %%END

## 2019-05-12 LAB — HX HEM-ROUTINE
HX BASO #: 0.1 10*3/uL (ref 0.0–0.2)
HX BASO: 1 %
HX EOSIN #: 0.1 10*3/uL (ref 0.0–0.5)
HX EOSIN: 1 %
HX HCT: 55.3 % — ABNORMAL HIGH (ref 37.0–47.0)
HX HGB: 17 g/dL — ABNORMAL HIGH (ref 13.5–16.0)
HX IMMATURE GRANULOCYTE#: 0 10*3/uL (ref 0.0–0.1)
HX IMMATURE GRANULOCYTE: 0 %
HX LYMPH #: 2 10*3/uL (ref 1.0–4.0)
HX LYMPH: 17 %
HX MCH: 31.2 pg (ref 26.0–34.0)
HX MCHC: 30.7 g/dL — ABNORMAL LOW (ref 32.0–36.0)
HX MCV: 101.5 fL — ABNORMAL HIGH (ref 80.0–98.0)
HX MONO #: 1.1 10*3/uL — ABNORMAL HIGH (ref 0.2–0.8)
HX MONO: 9 %
HX MPV: 10 fL (ref 9.1–11.7)
HX NEUT #: 8.5 10*3/uL — ABNORMAL HIGH (ref 1.5–7.5)
HX NRBC #: 0 10*3/uL
HX NUCLEATED RBC: 0 %
HX PLT: 182 10*3/uL (ref 150–400)
HX RBC BLOOD COUNT: 5.45 M/uL (ref 4.20–5.50)
HX RDW: 12.5 % (ref 11.5–14.5)
HX SEG NEUT: 72 %
HX WBC: 11.7 10*3/uL — ABNORMAL HIGH (ref 4.0–11.0)

## 2019-05-12 LAB — HX COAGULATION
HX D DIMER SENSITIVE: 285 ng/mL — ABNORMAL HIGH (ref 0–243)
HX INR PT: 0.9 (ref 0.9–1.3)
HX PROTHROMBIN TIME: 10.9 s (ref 9.7–14.0)
HX PTT: 32.3 s (ref 25.7–35.7)

## 2019-05-12 LAB — HX CHEM-PANELS
HX ANION GAP: 8 (ref 3–14)
HX BLOOD UREA NITROGEN: 23 mg/dL (ref 6–24)
HX CHLORIDE (CL): 99 meq/L (ref 98–110)
HX CO2: 34 meq/L — ABNORMAL HIGH (ref 20–30)
HX CREATININE (CR): 1.55 mg/dL — ABNORMAL HIGH (ref 0.57–1.30)
HX GFR, AFRICAN AMERICAN: 57 mL/min/{1.73_m2} — ABNORMAL LOW
HX GFR, NON-AFRICAN AMERICAN: 50 mL/min/{1.73_m2} — ABNORMAL LOW
HX GLUCOSE: 165 mg/dL — ABNORMAL HIGH (ref 70–139)
HX POTASSIUM (K): 4.9 meq/L (ref 3.6–5.1)
HX SODIUM (NA): 141 meq/L (ref 135–145)

## 2019-05-12 LAB — HX DIABETES
HX GLUCOSE: 165 mg/dL — ABNORMAL HIGH (ref 70–139)
HX HEMOGLOBIN A1C: 7.7 % — ABNORMAL HIGH

## 2019-05-12 LAB — HX CHEM-METABOLIC: HX HEMOGLOBIN A1C: 7.7 % — ABNORMAL HIGH

## 2019-05-12 LAB — HX POINT OF CARE
HX GLUCOSE-POCT: 142 mg/dL — ABNORMAL HIGH (ref 70–139)
HX GLUCOSE-POCT: 191 mg/dL — ABNORMAL HIGH (ref 70–139)
HX GLUCOSE-POCT: 294 mg/dL — ABNORMAL HIGH (ref 70–139)
HX GLUCOSE-POCT: 210 mg/dL — ABNORMAL HIGH (ref 70–139)

## 2019-05-12 LAB — HX CHEM-OTHER
HX CALCIUM (CA): 8.3 mg/dL — ABNORMAL LOW (ref 8.5–10.5)
HX MAGNESIUM: 2 mg/dL (ref 1.6–2.6)
HX PHOSPHORUS: 4.8 mg/dL — ABNORMAL HIGH (ref 2.7–4.5)

## 2019-05-12 LAB — HX CHEM-ENZ-FRAC: HX TROPONIN I: 0.06 ng/mL — ABNORMAL HIGH (ref 0.00–0.03)

## 2019-05-13 LAB — HX HEM-ROUTINE
HX BASO #: 0 10*3/uL (ref 0.0–0.2)
HX BASO: 0 %
HX EOSIN #: 0 10*3/uL (ref 0.0–0.5)
HX EOSIN: 0 %
HX HCT: 54.3 % — ABNORMAL HIGH (ref 37.0–47.0)
HX HGB: 16.5 g/dL — ABNORMAL HIGH (ref 13.5–16.0)
HX IMMATURE GRANULOCYTE#: 0 10*3/uL (ref 0.0–0.1)
HX IMMATURE GRANULOCYTE: 0 %
HX LYMPH #: 1.6 10*3/uL (ref 1.0–4.0)
HX LYMPH: 13 %
HX MCH: 30.8 pg (ref 26.0–34.0)
HX MCHC: 30.4 g/dL — ABNORMAL LOW (ref 32.0–36.0)
HX MCV: 101.3 fL — ABNORMAL HIGH (ref 80.0–98.0)
HX MONO #: 0.8 10*3/uL (ref 0.2–0.8)
HX MONO: 7 %
HX MPV: 9.8 fL (ref 9.1–11.7)
HX NEUT #: 9.3 10*3/uL — ABNORMAL HIGH (ref 1.5–7.5)
HX NRBC #: 0 10*3/uL
HX NUCLEATED RBC: 0 %
HX PLT: 185 10*3/uL (ref 150–400)
HX RBC BLOOD COUNT: 5.36 M/uL (ref 4.20–5.50)
HX RDW: 12.4 % (ref 11.5–14.5)
HX SEG NEUT: 79 %
HX WBC: 11.7 10*3/uL — ABNORMAL HIGH (ref 4.0–11.0)

## 2019-05-13 LAB — HX POINT OF CARE
HX GLUCOSE-POCT: 134 mg/dL (ref 70–139)
HX GLUCOSE-POCT: 230 mg/dL — ABNORMAL HIGH (ref 70–139)
HX GLUCOSE-POCT: 177 mg/dL — ABNORMAL HIGH (ref 70–139)
HX GLUCOSE-POCT: 299 mg/dL — ABNORMAL HIGH (ref 70–139)

## 2019-05-13 LAB — HX CHEM-PANELS
HX ANION GAP: 7 (ref 3–14)
HX BLOOD UREA NITROGEN: 22 mg/dL (ref 6–24)
HX CHLORIDE (CL): 101 meq/L (ref 98–110)
HX CO2: 36 meq/L — ABNORMAL HIGH (ref 20–30)
HX CREATININE (CR): 1.33 mg/dL — ABNORMAL HIGH (ref 0.57–1.30)
HX GFR, AFRICAN AMERICAN: 69 mL/min/{1.73_m2}
HX GFR, NON-AFRICAN AMERICAN: 60 mL/min/{1.73_m2}
HX GLUCOSE: 170 mg/dL — ABNORMAL HIGH (ref 70–139)
HX POTASSIUM (K): 4.9 meq/L (ref 3.6–5.1)
HX SODIUM (NA): 144 meq/L (ref 135–145)

## 2019-05-13 LAB — HX CHEM-OTHER
HX CALCIUM (CA): 8.5 mg/dL (ref 8.5–10.5)
HX MAGNESIUM: 2.3 mg/dL (ref 1.6–2.6)
HX PHOSPHORUS: 3.5 mg/dL (ref 2.7–4.5)

## 2019-05-13 LAB — HX DIABETES: HX GLUCOSE: 170 mg/dL — ABNORMAL HIGH (ref 70–139)

## 2019-05-14 LAB — HX POINT OF CARE
HX GLUCOSE-POCT: 154 mg/dL — ABNORMAL HIGH (ref 70–139)
HX GLUCOSE-POCT: 251 mg/dL — ABNORMAL HIGH (ref 70–139)
HX GLUCOSE-POCT: 289 mg/dL — ABNORMAL HIGH (ref 70–139)
HX GLUCOSE-POCT: 219 mg/dL — ABNORMAL HIGH (ref 70–139)

## 2019-05-14 LAB — HX CHEM-OTHER
HX CALCIUM (CA): 8.6 mg/dL (ref 8.5–10.5)
HX MAGNESIUM: 2.4 mg/dL (ref 1.6–2.6)
HX PHOSPHORUS: 4.5 mg/dL (ref 2.7–4.5)

## 2019-05-14 LAB — HX HEM-ROUTINE
HX BASO #: 0.1 10*3/uL (ref 0.0–0.2)
HX BASO: 1 %
HX EOSIN #: 0 10*3/uL (ref 0.0–0.5)
HX EOSIN: 0 %
HX HCT: 58.5 % — ABNORMAL HIGH (ref 37.0–47.0)
HX HGB: 17.3 g/dL — ABNORMAL HIGH (ref 13.5–16.0)
HX IMMATURE GRANULOCYTE#: 0 10*3/uL (ref 0.0–0.1)
HX IMMATURE GRANULOCYTE: 0 %
HX LYMPH #: 1.8 10*3/uL (ref 1.0–4.0)
HX LYMPH: 18 %
HX MCH: 30.2 pg (ref 26.0–34.0)
HX MCHC: 29.6 g/dL — ABNORMAL LOW (ref 32.0–36.0)
HX MCV: 102.3 fL — ABNORMAL HIGH (ref 80.0–98.0)
HX MONO #: 0.7 10*3/uL (ref 0.2–0.8)
HX MONO: 7 %
HX MPV: 10.1 fL (ref 9.1–11.7)
HX NEUT #: 7.5 10*3/uL (ref 1.5–7.5)
HX NRBC #: 0 10*3/uL
HX NUCLEATED RBC: 0 %
HX PLT: 180 10*3/uL (ref 150–400)
HX RBC BLOOD COUNT: 5.72 M/uL — ABNORMAL HIGH (ref 4.20–5.50)
HX RDW: 12.4 % (ref 11.5–14.5)
HX SEG NEUT: 74 %
HX WBC: 10.1 10*3/uL (ref 4.0–11.0)

## 2019-05-14 LAB — HX CHEM-PANELS
HX ANION GAP: 7 (ref 3–14)
HX BLOOD UREA NITROGEN: 24 mg/dL (ref 6–24)
HX CHLORIDE (CL): 99 meq/L (ref 98–110)
HX CO2: 35 meq/L — ABNORMAL HIGH (ref 20–30)
HX CREATININE (CR): 1.35 mg/dL — ABNORMAL HIGH (ref 0.57–1.30)
HX GFR, AFRICAN AMERICAN: 68 mL/min/{1.73_m2}
HX GFR, NON-AFRICAN AMERICAN: 59 mL/min/{1.73_m2} — ABNORMAL LOW
HX GLUCOSE: 156 mg/dL — ABNORMAL HIGH (ref 70–139)
HX POTASSIUM (K): 5 meq/L (ref 3.6–5.1)
HX SODIUM (NA): 141 meq/L (ref 135–145)

## 2019-05-14 LAB — HX DIABETES: HX GLUCOSE: 156 mg/dL — ABNORMAL HIGH (ref 70–139)

## 2019-05-15 LAB — HX HEM-ROUTINE
HX BASO #: 0 10*3/uL (ref 0.0–0.2)
HX BASO: 1 %
HX EOSIN #: 0 10*3/uL (ref 0.0–0.5)
HX EOSIN: 0 %
HX HCT: 55 % — ABNORMAL HIGH (ref 37.0–47.0)
HX HGB: 16.4 g/dL — ABNORMAL HIGH (ref 13.5–16.0)
HX IMMATURE GRANULOCYTE#: 0 10*3/uL (ref 0.0–0.1)
HX IMMATURE GRANULOCYTE: 0 %
HX LYMPH #: 1.3 10*3/uL (ref 1.0–4.0)
HX LYMPH: 14 %
HX MCH: 30.3 pg (ref 26.0–34.0)
HX MCHC: 29.8 g/dL — ABNORMAL LOW (ref 32.0–36.0)
HX MCV: 101.7 fL — ABNORMAL HIGH (ref 80.0–98.0)
HX MONO #: 0.8 10*3/uL (ref 0.2–0.8)
HX MONO: 8 %
HX MPV: 10.1 fL (ref 9.1–11.7)
HX NEUT #: 7.6 10*3/uL — ABNORMAL HIGH (ref 1.5–7.5)
HX NRBC #: 0 10*3/uL
HX NUCLEATED RBC: 0 %
HX PLT: 169 10*3/uL (ref 150–400)
HX RBC BLOOD COUNT: 5.41 M/uL (ref 4.20–5.50)
HX RDW: 12.4 % (ref 11.5–14.5)
HX SEG NEUT: 77 %
HX WBC: 9.8 10*3/uL (ref 4.0–11.0)

## 2019-05-15 LAB — HX CHEM-OTHER
HX CALCIUM (CA): 8.4 mg/dL — ABNORMAL LOW (ref 8.5–10.5)
HX MAGNESIUM: 2.2 mg/dL (ref 1.6–2.6)
HX PHOSPHORUS: 3.2 mg/dL (ref 2.7–4.5)

## 2019-05-15 LAB — HX CHEM-PANELS
HX ANION GAP: 8 (ref 3–14)
HX BLOOD UREA NITROGEN: 25 mg/dL — ABNORMAL HIGH (ref 6–24)
HX CHLORIDE (CL): 97 meq/L — ABNORMAL LOW (ref 98–110)
HX CO2: 33 meq/L — ABNORMAL HIGH (ref 20–30)
HX CREATININE (CR): 1.27 mg/dL (ref 0.57–1.30)
HX GFR, AFRICAN AMERICAN: 73 mL/min/{1.73_m2}
HX GFR, NON-AFRICAN AMERICAN: 63 mL/min/{1.73_m2}
HX GLUCOSE: 223 mg/dL — ABNORMAL HIGH (ref 70–139)
HX POTASSIUM (K): 5.2 meq/L — ABNORMAL HIGH (ref 3.6–5.1)
HX SODIUM (NA): 138 meq/L (ref 135–145)

## 2019-05-15 LAB — HX POINT OF CARE
HX GLUCOSE-POCT: 189 mg/dL — ABNORMAL HIGH (ref 70–139)
HX GLUCOSE-POCT: 246 mg/dL — ABNORMAL HIGH (ref 70–139)
HX GLUCOSE-POCT: 100 mg/dL (ref 70–139)
HX GLUCOSE-POCT: 135 mg/dL (ref 70–139)

## 2019-05-15 LAB — HX DIABETES: HX GLUCOSE: 223 mg/dL — ABNORMAL HIGH (ref 70–139)

## 2019-05-16 LAB — HX CHEM-PANELS
HX ANION GAP: 4 (ref 3–14)
HX BLOOD UREA NITROGEN: 28 mg/dL — ABNORMAL HIGH (ref 6–24)
HX CHLORIDE (CL): 99 meq/L (ref 98–110)
HX CO2: 38 meq/L — ABNORMAL HIGH (ref 20–30)
HX CREATININE (CR): 1.31 mg/dL — ABNORMAL HIGH (ref 0.57–1.30)
HX GFR, AFRICAN AMERICAN: 70 mL/min/{1.73_m2}
HX GFR, NON-AFRICAN AMERICAN: 61 mL/min/{1.73_m2}
HX GLUCOSE: 156 mg/dL — ABNORMAL HIGH (ref 70–139)
HX POTASSIUM (K): 5.3 meq/L — ABNORMAL HIGH (ref 3.6–5.1)
HX SODIUM (NA): 141 meq/L (ref 135–145)

## 2019-05-16 LAB — HX HEM-ROUTINE
HX BASO #: 0 10*3/uL (ref 0.0–0.2)
HX BASO: 1 %
HX EOSIN #: 0 10*3/uL (ref 0.0–0.5)
HX EOSIN: 0 %
HX HCT: 58 % — ABNORMAL HIGH (ref 37.0–47.0)
HX HGB: 17.2 g/dL — ABNORMAL HIGH (ref 13.5–16.0)
HX IMMATURE GRANULOCYTE#: 0 10*3/uL (ref 0.0–0.1)
HX IMMATURE GRANULOCYTE: 0 %
HX LYMPH #: 1.5 10*3/uL (ref 1.0–4.0)
HX LYMPH: 15 %
HX MCH: 30.4 pg (ref 26.0–34.0)
HX MCHC: 29.7 g/dL — ABNORMAL LOW (ref 32.0–36.0)
HX MCV: 102.5 fL — ABNORMAL HIGH (ref 80.0–98.0)
HX MONO #: 0.9 10*3/uL — ABNORMAL HIGH (ref 0.2–0.8)
HX MONO: 9 %
HX MPV: 10 fL (ref 9.1–11.7)
HX NEUT #: 7.6 10*3/uL — ABNORMAL HIGH (ref 1.5–7.5)
HX NRBC #: 0 10*3/uL
HX NUCLEATED RBC: 0 %
HX PLT: 149 10*3/uL — ABNORMAL LOW (ref 150–400)
HX RBC BLOOD COUNT: 5.66 M/uL — ABNORMAL HIGH (ref 4.20–5.50)
HX RDW: 12.3 % (ref 11.5–14.5)
HX SEG NEUT: 75 %
HX WBC: 10 10*3/uL (ref 4.0–11.0)

## 2019-05-16 LAB — HX CHEM-OTHER
HX CALCIUM (CA): 8.8 mg/dL (ref 8.5–10.5)
HX MAGNESIUM: 2.2 mg/dL (ref 1.6–2.6)
HX PHOSPHORUS: 5.2 mg/dL — ABNORMAL HIGH (ref 2.7–4.5)

## 2019-05-16 LAB — HX POINT OF CARE
HX GLUCOSE-POCT: 145 mg/dL — ABNORMAL HIGH (ref 70–139)
HX GLUCOSE-POCT: 167 mg/dL — ABNORMAL HIGH (ref 70–139)
HX GLUCOSE-POCT: 272 mg/dL — ABNORMAL HIGH (ref 70–139)
HX GLUCOSE-POCT: 112 mg/dL (ref 70–139)
HX GLUCOSE-POCT: 158 mg/dL — ABNORMAL HIGH (ref 70–139)

## 2019-05-16 LAB — HX DIABETES: HX GLUCOSE: 156 mg/dL — ABNORMAL HIGH (ref 70–139)

## 2019-05-16 LAB — HX MICRO

## 2019-05-17 LAB — HX HEM-ROUTINE
HX BASO #: 0 10*3/uL (ref 0.0–0.2)
HX BASO: 1 %
HX EOSIN #: 0 10*3/uL (ref 0.0–0.5)
HX EOSIN: 1 %
HX HCT: 55.5 % — ABNORMAL HIGH (ref 37.0–47.0)
HX HGB: 16.9 g/dL — ABNORMAL HIGH (ref 13.5–16.0)
HX IMMATURE GRANULOCYTE#: 0 10*3/uL (ref 0.0–0.1)
HX IMMATURE GRANULOCYTE: 0 %
HX LYMPH #: 1.5 10*3/uL (ref 1.0–4.0)
HX LYMPH: 19 %
HX MCH: 31.1 pg (ref 26.0–34.0)
HX MCHC: 30.5 g/dL — ABNORMAL LOW (ref 32.0–36.0)
HX MCV: 102 fL — ABNORMAL HIGH (ref 80.0–98.0)
HX MONO #: 0.6 10*3/uL (ref 0.2–0.8)
HX MONO: 7 %
HX MPV: 10.3 fL (ref 9.1–11.7)
HX NEUT #: 5.6 10*3/uL (ref 1.5–7.5)
HX NRBC #: 0 10*3/uL
HX NUCLEATED RBC: 0 %
HX PLT: 150 10*3/uL (ref 150–400)
HX RBC BLOOD COUNT: 5.44 M/uL (ref 4.20–5.50)
HX RDW: 12.3 % (ref 11.5–14.5)
HX SEG NEUT: 72 %
HX WBC: 7.7 10*3/uL (ref 4.0–11.0)

## 2019-05-17 LAB — HX CHEM-OTHER
HX CALCIUM (CA): 8.8 mg/dL (ref 8.5–10.5)
HX MAGNESIUM: 2.2 mg/dL (ref 1.6–2.6)
HX PHOSPHORUS: 4 mg/dL (ref 2.7–4.5)

## 2019-05-17 LAB — HX CHEM-PANELS
HX ANION GAP: 4 (ref 3–14)
HX BLOOD UREA NITROGEN: 29 mg/dL — ABNORMAL HIGH (ref 6–24)
HX CHLORIDE (CL): 99 meq/L (ref 98–110)
HX CO2: 38 meq/L — ABNORMAL HIGH (ref 20–30)
HX CREATININE (CR): 1.26 mg/dL (ref 0.57–1.30)
HX GFR, AFRICAN AMERICAN: 74 mL/min/{1.73_m2}
HX GFR, NON-AFRICAN AMERICAN: 64 mL/min/{1.73_m2}
HX GLUCOSE: 215 mg/dL — ABNORMAL HIGH (ref 70–139)
HX POTASSIUM (K): 5.5 meq/L — ABNORMAL HIGH (ref 3.6–5.1)
HX SODIUM (NA): 141 meq/L (ref 135–145)

## 2019-05-17 LAB — HX POINT OF CARE
HX GLUCOSE-POCT: 165 mg/dL — ABNORMAL HIGH (ref 70–139)
HX GLUCOSE-POCT: 206 mg/dL — ABNORMAL HIGH (ref 70–139)
HX GLUCOSE-POCT: 72 mg/dL (ref 70–139)
HX GLUCOSE-POCT: 182 mg/dL — ABNORMAL HIGH (ref 70–139)

## 2019-05-17 LAB — HX DIABETES: HX GLUCOSE: 215 mg/dL — ABNORMAL HIGH (ref 70–139)

## 2019-05-18 LAB — HX HEM-ROUTINE
HX BASO #: 0.1 10*3/uL (ref 0.0–0.2)
HX BASO: 1 %
HX EOSIN #: 0.1 10*3/uL (ref 0.0–0.5)
HX EOSIN: 1 %
HX HCT: 54.7 % — ABNORMAL HIGH (ref 37.0–47.0)
HX HGB: 16.6 g/dL — ABNORMAL HIGH (ref 13.5–16.0)
HX IMMATURE GRANULOCYTE#: 0 10*3/uL (ref 0.0–0.1)
HX IMMATURE GRANULOCYTE: 0 %
HX LYMPH #: 1.7 10*3/uL (ref 1.0–4.0)
HX LYMPH: 23 %
HX MCH: 30.2 pg (ref 26.0–34.0)
HX MCHC: 30.3 g/dL — ABNORMAL LOW (ref 32.0–36.0)
HX MCV: 99.6 fL — ABNORMAL HIGH (ref 80.0–98.0)
HX MONO #: 0.6 10*3/uL (ref 0.2–0.8)
HX MONO: 8 %
HX MPV: 10.4 fL (ref 9.1–11.7)
HX NEUT #: 5 10*3/uL (ref 1.5–7.5)
HX NRBC #: 0 10*3/uL
HX NUCLEATED RBC: 0 %
HX PLT: 164 10*3/uL (ref 150–400)
HX RBC BLOOD COUNT: 5.49 M/uL (ref 4.20–5.50)
HX RDW: 12.2 % (ref 11.5–14.5)
HX SEG NEUT: 68 %
HX WBC: 7.4 10*3/uL (ref 4.0–11.0)

## 2019-05-18 LAB — HX CHEM-PANELS
HX ANION GAP: 7 (ref 3–14)
HX ANION GAP: 7 (ref 3–14)
HX BLOOD UREA NITROGEN: 35 mg/dL — ABNORMAL HIGH (ref 6–24)
HX BLOOD UREA NITROGEN: 39 mg/dL — ABNORMAL HIGH (ref 6–24)
HX CHLORIDE (CL): 97 meq/L — ABNORMAL LOW (ref 98–110)
HX CHLORIDE (CL): 93 meq/L — ABNORMAL LOW (ref 98–110)
HX CO2: 33 meq/L — ABNORMAL HIGH (ref 20–30)
HX CO2: 41 meq/L — AB (ref 20–30)
HX CREATININE (CR): 1.29 mg/dL (ref 0.57–1.30)
HX CREATININE (CR): 1.4 mg/dL — ABNORMAL HIGH (ref 0.57–1.30)
HX GFR, AFRICAN AMERICAN: 72 mL/min/{1.73_m2}
HX GFR, AFRICAN AMERICAN: 65 mL/min/{1.73_m2}
HX GFR, NON-AFRICAN AMERICAN: 62 mL/min/{1.73_m2}
HX GFR, NON-AFRICAN AMERICAN: 56 mL/min/{1.73_m2} — ABNORMAL LOW
HX GLUCOSE: 224 mg/dL — ABNORMAL HIGH (ref 70–139)
HX GLUCOSE: 96 mg/dL (ref 70–139)
HX POTASSIUM (K): 5.4 meq/L — ABNORMAL HIGH (ref 3.6–5.1)
HX POTASSIUM (K): 4.8 meq/L (ref 3.6–5.1)
HX SODIUM (NA): 137 meq/L (ref 135–145)
HX SODIUM (NA): 141 meq/L (ref 135–145)

## 2019-05-18 LAB — HX CHEM-OTHER
HX CALCIUM (CA): 8.7 mg/dL (ref 8.5–10.5)
HX CALCIUM (CA): 9.2 mg/dL (ref 8.5–10.5)
HX MAGNESIUM: 2.2 mg/dL (ref 1.6–2.6)
HX MAGNESIUM: 2.2 mg/dL (ref 1.6–2.6)
HX PHOSPHORUS: 4.2 mg/dL (ref 2.7–4.5)
HX PHOSPHORUS: 5.2 mg/dL — ABNORMAL HIGH (ref 2.7–4.5)

## 2019-05-18 LAB — HX POINT OF CARE
HX GLUCOSE-POCT: 188 mg/dL — ABNORMAL HIGH (ref 70–139)
HX GLUCOSE-POCT: 96 mg/dL (ref 70–139)
HX GLUCOSE-POCT: 88 mg/dL (ref 70–139)
HX GLUCOSE-POCT: 153 mg/dL — ABNORMAL HIGH (ref 70–139)

## 2019-05-18 LAB — HX DIABETES
HX GLUCOSE: 224 mg/dL — ABNORMAL HIGH (ref 70–139)
HX GLUCOSE: 96 mg/dL (ref 70–139)

## 2019-05-19 LAB — HX CHEM-PANELS
HX ANION GAP: 6 (ref 3–14)
HX BLOOD UREA NITROGEN: 34 mg/dL — ABNORMAL HIGH (ref 6–24)
HX CHLORIDE (CL): 93 meq/L — ABNORMAL LOW (ref 98–110)
HX CO2: 41 meq/L — AB (ref 20–30)
HX CREATININE (CR): 1.35 mg/dL — ABNORMAL HIGH (ref 0.57–1.30)
HX GFR, AFRICAN AMERICAN: 68 mL/min/{1.73_m2}
HX GFR, NON-AFRICAN AMERICAN: 59 mL/min/{1.73_m2} — ABNORMAL LOW
HX GLUCOSE: 163 mg/dL — ABNORMAL HIGH (ref 70–139)
HX POTASSIUM (K): 4.2 meq/L (ref 3.6–5.1)
HX SODIUM (NA): 140 meq/L (ref 135–145)

## 2019-05-19 LAB — HX DIABETES: HX GLUCOSE: 163 mg/dL — ABNORMAL HIGH (ref 70–139)

## 2019-05-19 LAB — HX POINT OF CARE
HX GLUCOSE-POCT: 156 mg/dL — ABNORMAL HIGH (ref 70–139)
HX GLUCOSE-POCT: 86 mg/dL (ref 70–139)

## 2019-05-19 LAB — HX CHEM-OTHER
HX CALCIUM (CA): 8.9 mg/dL (ref 8.5–10.5)
HX MAGNESIUM: 2.1 mg/dL (ref 1.6–2.6)
HX PHOSPHORUS: 4.7 mg/dL — ABNORMAL HIGH (ref 2.7–4.5)

## 2019-05-19 MED FILL — *METOPROLOL SUCC ER 50 MG T: 30 days supply | Qty: 30 | Fill #0 | Status: AC

## 2019-05-19 MED FILL — FREESTYLE LITE TEST STRP: 50 days supply | Qty: 200 | Fill #0 | Status: AC

## 2019-05-19 MED FILL — *FUROSEMIDE 20 MG TAB: 30 days supply | Qty: 30 | Fill #0 | Status: AC

## 2019-05-19 MED FILL — ARNUITY ELLIPTA 200 MCG INH: 30 days supply | Qty: 30 | Fill #0 | Status: AC

## 2019-05-19 MED FILL — FREESTYLE LITE DEVI: 30 days supply | Qty: 1 | Fill #0 | Status: AC

## 2019-05-19 MED FILL — *metFORMIN 1000 MG TABS: 30 days supply | Qty: 60 | Fill #0 | Status: AC

## 2019-05-19 MED FILL — FREESTYLE LANCETS MISC: 50 days supply | Qty: 200 | Fill #0 | Status: AC

## 2019-05-22 ENCOUNTER — Ambulatory Visit

## 2019-05-22 NOTE — Progress Notes (Signed)
* * *      ILYAAS, MUSTO **DOB:** October 11, 1965 (54 yo M) **Acc No.** [id number] **DOS:**  05/22/2019    ---       [name]**    ------    13 Y old Male, DOB: 09/05/1965    [address] [place], [place] [address]    Home: [phone]    Provider: Mena Goes, NP        * * *    Telephone Encounter    ---    Answered by  Mena Goes Date: 05/22/2019       Time: 04:35 PM    Caller  C Oneda Duffett    ------            Reason  post-discharge phone call            Message                     Patient admitted to [facility] 12/31-1/8 for new heart failure. Post-discharge phone call made to patient's group home manager. He reports Lonald is doing well since discharge. He is not sure of his weight but reports patient does get weighed daily and group home staff helps with his daily medications. He is not able to review meds with me on the phone, but will bring all meds to appt Weds. Reviewed HF education. Group home staff member will accompany patient to appt in HF clinic 1/13. Clinic contact info provided.                    * * *                ---          * * *         Provider: Mena Goes, NP 05/22/2019    ---    Note generated by eClinical[employer] EMR/PM Software ([email or web address])

## 2019-05-23 ENCOUNTER — Ambulatory Visit

## 2019-05-24 ENCOUNTER — Ambulatory Visit: Admit: 2019-05-24 | Payer: Medicare Other

## 2019-05-24 ENCOUNTER — Ambulatory Visit: Admitting: Acute Care

## 2019-05-24 ENCOUNTER — Ambulatory Visit: Admitting: Internal Medicine

## 2019-05-24 LAB — HX CHEM-PANELS
HX ANION GAP: 9 (ref 3–14)
HX BLOOD UREA NITROGEN: 27 mg/dL — ABNORMAL HIGH (ref 6–24)
HX CHLORIDE (CL): 103 meq/L (ref 98–110)
HX CO2: 27 meq/L (ref 20–30)
HX CREATININE (CR): 1.3 mg/dL (ref 0.57–1.30)
HX GFR, AFRICAN AMERICAN: 71 mL/min/{1.73_m2}
HX GFR, NON-AFRICAN AMERICAN: 61 mL/min/{1.73_m2}
HX GLUCOSE: 228 mg/dL — ABNORMAL HIGH (ref 70–139)
HX POTASSIUM (K): 5.3 meq/L — ABNORMAL HIGH (ref 3.6–5.1)
HX SODIUM (NA): 139 meq/L (ref 135–145)

## 2019-05-24 LAB — HX DIABETES: HX GLUCOSE: 228 mg/dL — ABNORMAL HIGH (ref 70–139)

## 2019-05-24 LAB — HX CHEM-ENZ-FRAC: HX B NATRIURETIC PEPTIDE (BNP): 286 pg/mL — ABNORMAL HIGH (ref 0–100)

## 2019-05-24 MED ORDER — Metoprolol Succinate ER: 50 | Tablet | Freq: Every day | 2 refills | 0 days | Status: AC

## 2019-05-24 MED ORDER — Losartan Potassium: 50 | Tablet | Freq: Every day | 2 refills | 0 days | Status: AC

## 2019-05-24 NOTE — Progress Notes (Signed)
 * * Arthur Young, Arthur Young **DOB:** 12-Aug-1965 (54 yo M) **Acc No.** [id number] **DOS:**  05/24/2019    ---       Arthur Young**    ------    54 Y old Male, DOB: March 06, 1966, External MRN: [id number]    Account Number: [id number]    [address], [id number]    Home: [phone]    Insurance: M80 [employer] C[employer]/ONE CARE    PCP: Berenice Primas, NP Referring: Berenice Primas, NP    Appointment Facility: [facility]        * * *    05/24/2019  **Appointment Provider:** Catha Nottingham, NP **CHN#:** [phone]    ------    ---       **Reason for Appointment**    ---      1\. HFrEF follow up, s/p hospitalization 05/11/19-05/19/19    ---      **History of Present Illness**    ---     _GENERAL_ :    It was a pleasure to see Arthur Young in the Heart Failure Clinic at [facility]. Arthur Young is a 54 year old male a history of HFrEF, HTN, DM,  COPD, and schizoaffective disorder (paranoid).    05/11/19-05/19/19: Arthur Young presented with fluid overload on exam (pitting  edema, crackles), concerning for a heart failure exacerbation. TTE showed LVEF  30%, LVSF severely reduced, RVSF moderately reduced, mild to moderate TR, RVSP  40-6mmHg. PCP records indicated the patient had EF ~ 30% for > 10 years  (details not currently available). Patient has reportedly been resistant to  medication changes in the past. BNP 224. He was diuresed and discharged on  Lasix 20mg  daily. Discharge weight 77.1 kg. In addition, there was felt to be  a component of COPD exacerbation and he was started on steroids, standing  duonebs, and empiric antibiotics for possible community-acquired pneumonia.    Today, 05/24/19, Arthur Young presents to the Heart Failure Clinic with his group  home attendant. He reports feeling much better. He states SOB and cough has  resolved. Fatigue also appears to have resolved. He does not report  significant activity at baseline, but does not appear to have chest pain or  dyspnea with activity. He  does report that his current medical conditions are  related to a curse. He is adherent with medications with the assistance with  group home staff. He weighs himself daily. He is not willing to check  fingerstick glucoses.     _Ambulatory Falls and Injury Prevention_ :    HPI Have you experienced a fall in the past year? No, Is the patient using  assistive devices such as a cane or walker? No, Do you need assistance with  ambulation while at our facility? No, Interventions none, patient not a fall  risk .     **Current Medications**    ---    Taking    * Acetaminophen 500 MG Tablet 1 tablet as needed Orally every 6 hrs    ---    * Albuterol Sulfate 108 (90 Base) MCG/ACT Aerosol Powder Breath Activated 2 puff as needed Inhalation every 4 hrs    ---    * Ammonium Lactate 12 % Cream 1 application Externally Twice a day as needed for itching    ---    * Arnuity Ellipta 200 MCG/ACT Aerosol Powder Breath Activated 1 puff Inhalation Once a day    ---    * Aspirin 81 MG Tablet Chewable 1  tablet Orally Once a day    ---    * Atorvastatin Calcium 20 MG Tablet 1 tablet Orally Once a day    ---    * Brimonidine Tartrate 0.15 % Solution 1 drop to both eyes Ophthalmic Twice a day    ---    * Carbamazepine 200 MG Tablet 3 tablets Orally Once a day at bedtime    ---    * Carbamazepine 200 mg Tablet 2 tablets Orally in the AM    ---    * Docusate Sodium 100 MG Capsule 1 capsule Orally Twice a day    ---    * Dorzolamide HCl 2 % Solution 1 drop to both eyes Ophthalmic Twice a day    ---    * Flovent HFA 110 MCG/ACT Aerosol 2 puffs Inhalation Twice a day    ---    * Furosemide 20 MG Tablet 1 tablet Orally Once a day    ---    * Incruse Ellipta 62.5 MCG/INH Aerosol Powder Breath Activated 1 puff Inhalation Once a day    ---    * Losartan Potassium 100 MG Tablet 1 tablet Orally Once a day    ---    * Metformin HCl 1000 MG Tablet 1 tablet Orally Twice a day    ---    * Metoprolol Succinate ER 50 MG Tablet Extended Release 24 Hour 1  tablet Orally Once a day    ---    * Mirtazapine 15 MG Tablet 1 tablet at bedtime Orally Once a day    ---    * Olanzapine 10 MG Tablet 1 tablet Orally Once a day    ---    * Polyethylene Glycol 3350 17 GM Packet 1 as needed for constipation Orally Once a day    ---    * Quetiapine Fumarate 300 MG Tablet 1 tablet at bedtime Orally Once a day    ---    * Sennosides 8.6 MG Tablet 1 tablet at bedtime as needed Orally Once a day    ---    * Medication List reviewed and reconciled with the patient    ---      **Past Medical History**    ---      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        ---    COPD.        ---    T2DM.        ---    HTN.        ---    HLD.        ---    Schizoaffective disorder.        ---    Insomnia.        ---    Cataracts.        ---      **Social History**    ---    Abuse/Neglect  Do you feel unsafe in your relationships? No, Have you ever  been hit, kicked, punched or otherwise hurt by someone in the past year? No.   No Etoh, ilicits. Current smoker, 1 ppd for "a lot of years". Resides in group  home.    ---      **Allergies**    ---      Depakote    ---    Lithium    ---      **Hospitalization/Major Diagnostic Procedure**    ---      ADHF, COPD 05/2019    ---      **  Review of Systems**    ---     _CardioVascular_ :    Neurologic no numbness, weakness, no difficulty speaking, or paresthesias;  cataracts and glaucoma. General/Constitutional no fever, chills, night sweats,  headaches, change in appetite / weight, fatigue; + insomnia. Gastrointestinal  no blood in stool, nausea, vomiting, diarrhea, or abdominal pain; +  constipation. Hematology no easy bruising, no prolonged bleeding. Peripheral  Vascular no claudication, no skin ulceration. Endocrine no polydipsia,  polyphagia, hot and cold intolerance. Skin no rash. ENT no nosebleeds.  Cardiovascular per HPI. Respiratory no cough, wheezing, shortness of breath at  rest, sore throat, upper airway congestion, or hemoptysis. Genitourinary  no  blood in urine. Musculoskeletal no arthraglias, myalgias, or muscle aches.         **Vital Signs**    ---    Pain scale 0, Ht-in 62, Wt-lbs 178.8, BMI 32.70, BP 98/66, HR 103, BSA 1.88,  O2 94, Ht-cm 157.48, Wt-kg 81.1    176 lbs at home (05/24/19).      **Examination**    ---     _Cardiac Testing:_    Echo: 05/15/19: EF 30%. LV mildly dilated, LVSF severely reduced. The tissue  Doppler diastolic parameters are indeterminate. Severe global hypokinesis of  the left ventricle. Flattened septum is consistent with RV pressure overload.  RV mild to moderately dilated. RVSF moderately reduced. LA normal. RA  moderately dilated. The inter-atrial septum bows toward the left atrium  consistent with high right atrial pressure. A dilated inferior vena cava  suggests increased right atrial pressure. The mitral valve leaflets appear  mildly thickened. There is mild mitral annular calcification. Trace MR. Mild  to moderate TR. RVSP 40-10mmHg. Trace PVR.         **Physical Examination**    ---     _General Examination_ :    General Appearance well developed, well nourished, gentleman no acute  distress. Head normocephalic. Eyes sclera non-icteric. Neck/Thyroid no JVD  (though somewhat difficult exam due to body habitus), no carotid bruit. Skin  no rashes. Heart regular rate and rhythm, normal S1, normal S2. Lungs clear to  auscultation bilaterally with no wheezing, crackles, or rhonchi. Abdomen  large, soft, non tender, with bowel sounds present, no organomegaly  appreciated. Extremities no edema, clubbing, or cyanosis. Peripheral Pulses  2  + radial, 2 + dorsalis pedis, normal symmetric pulses bilaterally.  Neurological nonfocal, no gross focal deficits.         **Assessments**    ---    1\. Chronic HFrEF (heart failure with reduced ejection fraction) - I50.22  (Primary)    ---    2\. Essential hypertension - I10    ---    3\. Type 2 diabetes mellitus without complication, without long-term current  use of insulin - E11.9     ---    4\. Stage 2 chronic kidney disease - N18.2    ---     Arthur Young is a 55 year old male a history of HFrEF, HTN, DM, COPD, CKD  and schizoaffective disorder (paranoid). He was recently admitted with AECOPD  and ADHF.    TTE (05/15/19): EF 30%. LV mildly dilated, RV mild to moderately dilated with  moderately reduced systolic function. RA moderately dilated. Mild to moderate  TR. RVSP 40-72mmHg.    Labs: BUN 27, Cr 1.30, Na 139, K 5.3, BNP 286    # HFrEF. NYHA II, appears euvolemic.    Unclear etiology. Will obtain records.    Current regime: Losartan 100mg  daily,  Metoprolol 50mg  daily, Lasix 20mg  daily.    Will hold off spironolactone given hyperK.    HR not at goal (103 in clinic). Will increase metoprolol to 75mg . Decrease  Losartan to 50mg  to allow BP room (perhaps decrease K).    Does not have ICD. Will review with PCP if this has previoulys been discucced.    # HTN.    Soft BP today, but asymptomatic.    Follow.    # HLD.    Atorvastatin 20mg  daily.    # HyperKalemia.    Mild. May need low K diet.    # [id number].    Basleine Cr ~ 1.3-1.4    # T2DM.    A1c. 7.7%. Goal 6.5-7%    Metformin increased to 1000mg  BID during hospitalization.    Recommed starting SGLT-i for HF and DM benefits.    Thank you for the opportunity to assist in the care of Arthur Young.    ---      **Treatment**    ---      **1\. Chronic HFrEF (heart failure with reduced ejection fraction)**    Decrease Losartan Potassium Tablet, 50 MG, 1 tablet, Orally, Once a day, 30  days, 30 Tablet, Refills 2    Increase Metoprolol Succinate ER Tablet Extended Release 24 Hour, 50 MG, 1.5  tablets (75 mg), Orally, Once a day, 30 days, 45 Tablet, Refills 2    Continue Furosemide Tablet, 20 MG, 1 tablet, Orally, Once a day    ---         **2\. Type 2 diabetes mellitus without complication, without long-term  current use of insulin**    Continue Metformin HCl Tablet, 1000 MG, 1 tablet, Orally, Twice a day      **Procedure Codes**    ---      99496  TRANS CARE MGMT 7 DAY DISCH    ---      **Follow Up**    ---    2 Weeks, then transition back to DOT [facility]    **Appointment Provider:** Catha Nottingham, NP    Electronically signed by Catha Nottingham , NP on 05/25/2019 at 02:54 PM EST    Sign off status: Completed        * * *        Cardiac Subspecialty    [address]    [place], [address]    [place], [place] [address]    Tel: [phone]    Fax: [phone]              * * *          Progress Note: Catha Nottingham, NP 05/24/2019    ---    Note generated by eClinicalWorks EMR/PM Software ([email or web address])

## 2019-05-24 NOTE — Progress Notes (Signed)
 .  Progress Notes  .  Patient: Arthur Young  Provider: Catha Nottingham    .  DOB: 04-13-1966 Age: 54 Y Sex: Male  .  PCP: Berenice Primas  NP  Date: 05/24/2019  .  --------------------------------------------------------------------------------  .  REASON FOR APPOINTMENT  .  1. HFrEF follow up, s/p hospitalization 05/11/19-05/19/19  .  HISTORY OF PRESENT ILLNESS  .  GENERAL:  It was a pleasure to see Mr. Aven Christen  in the Heart Failure Clinic at [facility]. Mr. Highfill  is a 54 year old male a history of HFrEF, HTN, DM, COPD, and  schizoaffective disorder (paranoid). 05/11/19-05/19/19: Mr. Stuck  presented with fluid overload on exam (pitting edema, crackles),  concerning for a heart failure exacerbation. TTE showed LVEF 30%,  LVSF severely reduced, RVSF moderately reduced, mild to moderate  TR, RVSP 40-42mmHg. PCP records indicated the patient had EF   .  30% for > 10 years (details not currently available). Patient has  reportedly been resistant to medication changes in the past. BNP  224. He was diuresed and discharged on Lasix 20mg  daily.  Discharge weight 77.1 kg. In addition, there was felt to be a  component of COPD exacerbation and he was started on steroids,  standing duonebs, and empiric antibiotics for possible  community-acquired pneumonia. Today, 05/24/19, Mr. Seufert presents  to the Heart Failure Clinic with his group home attendant. He  reports feeling much better. He states SOB and cough has  resolved. Fatigue also appears to have resolved. He does not  report significant activity at baseline, but does not appear to  have chest pain or dyspnea with activity. He does report that his  current medical conditions are related to a curse. He is adherent  with medications with the assistance with group home staff. He  weighs himself daily. He is not willing to check fingerstick  glucoses.  .  Ambulatory Falls and Injury Prevention:  HPI  .  Have you experienced a fall in the past year?No , Is the  patient  using assistive devices such as a cane or walker?No , Do you need  assistance with ambulation while at our facility?No ,  Interventionsnone, patient not a fall risk  .  [name]  CURRENT MEDICATIONS  .  Taking Acetaminophen 500 MG Tablet 1 tablet as needed Orally  every 6 hrs  Taking Albuterol Sulfate 108 (90 Base) MCG/ACT Aerosol Powder  Breath Activated 2 puff as needed Inhalation every 4 hrs  Taking Ammonium Lactate 12 % Cream 1 application Externally Twice  a day as needed for itching  Taking Arnuity Ellipta 200 MCG/ACT Aerosol Powder Breath  Activated 1 puff Inhalation Once a day  Taking Aspirin 81 MG Tablet Chewable 1 tablet Orally Once a day  Taking Atorvastatin Calcium 20 MG Tablet 1 tablet Orally Once a  day  Taking Brimonidine Tartrate 0.15 % Solution 1 drop to both eyes  Ophthalmic Twice a day  Taking Carbamazepine 200 MG Tablet 3 tablets Orally Once a day at  bedtime  Taking Carbamazepine 200 mg Tablet 2 tablets Orally in the AM  Taking Docusate Sodium 100 MG Capsule 1 capsule Orally Twice a  day  Taking Dorzolamide HCl 2 % Solution 1 drop to both eyes  Ophthalmic Twice a day  Taking Flovent HFA 110 MCG/ACT Aerosol 2 puffs Inhalation Twice a  day  Taking Furosemide 20 MG Tablet 1 tablet Orally Once a day  Taking Incruse Ellipta 62.5 MCG/INH Aerosol Powder Breath  Activated 1  puff Inhalation Once a day  Taking Losartan Potassium 100 MG Tablet 1 tablet Orally Once a  day  Taking Metformin HCl 1000 MG Tablet 1 tablet Orally Twice a day  Taking Metoprolol Succinate ER 50 MG Tablet Extended Release 24  Hour 1 tablet Orally Once a day  Taking Mirtazapine 15 MG Tablet 1 tablet at bedtime Orally Once a  day  Taking Olanzapine 10 MG Tablet 1 tablet Orally Once a day  Taking Polyethylene Glycol 3350 17 GM Packet 1 as needed for  constipation Orally Once a day  Taking Quetiapine Fumarate 300 MG Tablet 1 tablet at bedtime  Orally Once a day  Taking Sennosides 8.6 MG Tablet 1 tablet at bedtime as needed  Orally  Once a day  Medication List reviewed and reconciled with the patient  .  PAST MEDICAL HISTORY  .  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  ALLERGIES  .  Depakote  Lithium  .  SOCIAL HISTORY  .  .  Abuse/NeglectDo you feel unsafe in your relationships?No , Have  you ever been hit, kicked, punched or otherwise hurt by someone  in the past year? No.  .  No Etoh, ilicits. Current smoker, 1 ppd for "a lot of years".  Resides in group home.  [name]  HOSPITALIZATION/MAJOR DIAGNOSTIC PROCEDURE  .  ADHF, COPD 05/2019  .  REVIEW OF SYSTEMS  .  CardioVascular:  .  Neurologic    no numbness, weakness, no difficulty speaking, or  paresthesias; cataracts and glaucoma . General/Constitutional     no fever, chills, night sweats, headaches, change in appetite /  weight, fatigue; + insomnia . Gastrointestinal    no blood in  stool, nausea, vomiting, diarrhea, or abdominal pain; +  constipation . Hematology    no easy bruising, no prolonged  bleeding . Peripheral Vascular    no claudication, no skin  ulceration . Endocrine    no polydipsia, polyphagia, hot and cold  intolerance . Skin    no rash . ENT    no nosebleeds .  Cardiovascular    per HPI . Respiratory    no cough, wheezing,  shortness of breath at rest, sore throat, upper airway  congestion, or hemoptysis . Genitourinary    no blood in urine .  Musculoskeletal    no arthraglias, myalgias, or muscle aches .  [name]  VITAL SIGNS  .  Pain scale 0, Ht-in 62, Wt-lbs 178.8, BMI 32.70, BP 98/66, HR  103, BSA 1.88, O2 94, Ht-cm 157.48, Wt-kg 81.1176 lbs at home  (05/24/19).  [name]  EXAMINATION  .  Cardiac Testing:  Echo:05/15/19: EF 30%. LV mildly dilated, LVSF severely reduced.  The tissue Doppler diastolic parameters are indeterminate. Severe  global hypokinesis of the left ventricle. Flattened septum is  consistent with RV pressure overload. RV mild to moderately  dilated. RVSF moderately reduced. LA normal. RA  moderately  dilated. The inter-atrial septum bows toward the left atrium  consistent with high right atrial pressure. A dilated inferior  vena cava suggests increased right atrial pressure. The mitral  valve leaflets appear mildly thickened. There is mild mitral  annular calcification. Trace MR. Mild to moderate TR. RVSP  40-58mmHg. Trace PVR.  [name]  PHYSICAL EXAMINATION  .  General Examination:  General Appearance  well developed, well nourished, gentleman no  acute distress. Head  normocephalic. Eyes  sclera non-icteric.  Neck/Thyroid  no  JVD (though somewhat difficult exam due to body  habitus), no carotid bruit. Skin  no rashes. Heart  regular rate  and rhythm, normal S1, normal S2. Lungs  clear to auscultation  bilaterally with no wheezing, crackles, or rhonchi. Abdomen   large, soft, non tender, with bowel sounds present, no  organomegaly appreciated. Extremities  no edema, clubbing, or  cyanosis. Peripheral Pulses  2 + radial, 2 + dorsalis pedis,  normal symmetric pulses bilaterally. Neurological  nonfocal, no  gross focal deficits.  .  ASSESSMENTS  .  Chronic HFrEF (heart failure with reduced ejection fraction) -  I50.22 (Primary)  .  Essential hypertension - I10  .  Type 2 diabetes mellitus without complication, without long-term  current use of insulin - E11.9  .  Stage 2 chronic kidney disease - N18.2  .  Mr. Riyaan Heroux is a 54 year old male a history of HFrEF, HTN,  DM, COPD, CKD and schizoaffective disorder (paranoid). He was  recently admitted with AECOPD and ADHF.TTE (05/15/19): EF 30%. LV  mildly dilated, RV mild to moderately dilated with moderately  reduced systolic function. RA moderately dilated. Mild to  moderate TR. RVSP 40-49mmHg. Labs: BUN 27, Cr 1.30, Na 139, K  5.3, BNP 286# HFrEF. NYHA II, appears euvolemic.Unclear etiology.  Will obtain records.Current regime: Losartan 100mg  daily,  Metoprolol 50mg  daily, Lasix 20mg  daily.Will hold off  spironolactone given hyperK.HR not at goal (103 in  clinic). Will  increase metoprolol to 75mg . Decrease Losartan to 50mg  to allow  BP room (perhaps decrease K).Does not have ICD. Will review with  PCP if this has previoulys been discucced.# HTN.Soft BP today,  but asymptomatic.Follow.# HLD.Atorvastatin 20mg  daily.#  HyperKalemia.Mild. May need low K diet.# [id number].Basleine Cr   .  1.3-1.4# T2DM.A1c. 7.7%. Goal 6.5-7%Metformin increased to 1000mg   BID during hospitalization.Recommed starting SGLT-i for HF and DM  benefits.Thank you for the opportunity to assist in the care of  Mr. Caponi.  .  TREATMENT  .  Chronic HFrEF (heart failure with reduced ejection  fraction)  Decrease Losartan Potassium Tablet, 50 MG, 1 tablet, Orally, Once  a day, 30 days, 30 Tablet, Refills 2  Increase Metoprolol Succinate ER Tablet Extended Release 24 Hour,  50 MG, 1.5 tablets (75 mg), Orally, Once a day, 30 days, 45  Tablet, Refills 2  Continue Furosemide Tablet, 20 MG, 1 tablet, Orally, Once a day  .  [name]  Type 2 diabetes mellitus without complication,  without long-term current use of insulin  Continue Metformin HCl Tablet, 1000 MG, 1 tablet, Orally, Twice a  day  .  PROCEDURE CODES  .  99496 TRANS CARE MGMT 7 DAY DISCH  .  FOLLOW UP  .  2 Weeks, then transition back to DOT [facility]  .  [name]  Appointment Provider: Catha Nottingham, NP  .  Electronically signed by Catha Nottingham , NP on  05/25/2019 at 02:54 PM EST  .  Document electronically signed by Catha Nottingham    .

## 2019-06-08 ENCOUNTER — Observation Stay
Admission: EM | Admit: 2019-06-08 | Discharge: 2019-06-13 | Disposition: A | Discharge status: Home Health | Payer: Medicare Other | Source: Intra-hospital

## 2019-06-08 ENCOUNTER — Inpatient Hospital Stay
Admit: 2019-06-08 | Disposition: A | Discharge status: Home Health | Source: Emergency Department | Attending: Nephrology | Admitting: Nephrology

## 2019-06-08 ENCOUNTER — Emergency Department
Admit: 2019-06-08 | Disposition: A | Discharge status: Other Acute Inpatient Hospital | Source: Ambulatory Visit | Attending: Emergency Medicine | Admitting: Emergency Medicine

## 2019-06-08 LAB — HX HEM-ROUTINE
HX BASO #: 0 10*3/uL (ref 0.0–0.2)
HX BASO: 1 %
HX EOSIN #: 0.1 10*3/uL (ref 0.0–0.5)
HX EOSIN: 1 %
HX HCT: 53.7 % — ABNORMAL HIGH (ref 37.0–47.0)
HX HGB: 16.6 g/dL — ABNORMAL HIGH (ref 13.5–16.0)
HX IMMATURE GRANULOCYTE#: 0 10*3/uL (ref 0.0–0.1)
HX IMMATURE GRANULOCYTE: 0 %
HX LYMPH #: 1.7 10*3/uL (ref 1.0–4.0)
HX LYMPH: 21 %
HX MCH: 30.6 pg (ref 26.0–34.0)
HX MCHC: 30.9 g/dL — ABNORMAL LOW (ref 32.0–36.0)
HX MCV: 98.9 fL — ABNORMAL HIGH (ref 80.0–98.0)
HX MONO #: 0.9 10*3/uL — ABNORMAL HIGH (ref 0.2–0.8)
HX MONO: 11 %
HX MPV: 10 fL (ref 9.1–11.7)
HX NEUT #: 5.3 10*3/uL (ref 1.5–7.5)
HX NRBC #: 0 10*3/uL
HX NUCLEATED RBC: 0 %
HX PLT: 180 10*3/uL (ref 150–400)
HX RBC BLOOD COUNT: 5.43 M/uL (ref 4.20–5.50)
HX RDW: 13.5 % (ref 11.5–14.5)
HX SEG NEUT: 66 %
HX WBC: 8.1 10*3/uL (ref 4.0–11.0)

## 2019-06-08 LAB — HX CHEM-ENZ-FRAC
HX B NATRIURETIC PEPTIDE (BNP): 827 pg/mL — ABNORMAL HIGH (ref 0–100)
HX TROPONIN I: 0.05 ng/mL — ABNORMAL HIGH (ref 0.00–0.03)

## 2019-06-08 LAB — HX COAGULATION
HX INR PT: 1.1 (ref 0.9–1.3)
HX PROTHROMBIN TIME: 12.3 s (ref 9.7–14.0)
HX PTT: 29.5 s (ref 25.7–35.7)

## 2019-06-08 LAB — HX CHEM-PANELS
HX ANION GAP: 10 (ref 3–14)
HX BLOOD UREA NITROGEN: 41 mg/dL — ABNORMAL HIGH (ref 6–24)
HX CHLORIDE (CL): 96 meq/L — ABNORMAL LOW (ref 98–110)
HX CO2: 25 meq/L (ref 20–30)
HX CREATININE (CR): 2.15 mg/dL — ABNORMAL HIGH (ref 0.57–1.30)
HX GFR, AFRICAN AMERICAN: 39 mL/min/{1.73_m2} — ABNORMAL LOW
HX GFR, NON-AFRICAN AMERICAN: 33 mL/min/{1.73_m2} — ABNORMAL LOW
HX GLUCOSE: 177 mg/dL — ABNORMAL HIGH (ref 70–139)
HX LACTATE: 1.4 meq/L (ref 0.5–2.2)
HX POTASSIUM (K): 5.9 meq/L — ABNORMAL HIGH (ref 3.6–5.1)
HX POTASSIUM (K): 6.2 meq/L — AB (ref 3.6–5.1)
HX SODIUM (NA): 131 meq/L — ABNORMAL LOW (ref 135–145)

## 2019-06-08 LAB — HX DIABETES: HX GLUCOSE: 177 mg/dL — ABNORMAL HIGH (ref 70–139)

## 2019-06-08 LAB — HX POINT OF CARE: HX GLUCOSE-POCT: 160 mg/dL — ABNORMAL HIGH (ref 70–139)

## 2019-06-08 LAB — HX MICRO-RESP VIRAL PANEL: HX COVID-19 (SARS-COV-2) RAPID: NEGATIVE

## 2019-06-08 NOTE — ED Provider Notes (Signed)
 [name]  Name: Arthur Young, Arthur Young  MRN: [id number]  Age: 54 yrs  Sex: Male  DOB: 1966-01-05  Arrival Date: 06/08/2019  Arrival Time: 19:57  Account#: [id number]  .  Working Diagnosis: COPD/ Chronic obstructive pulmonary disease with (acute)   exacerbation  PCP: Berenice Primas  .  HPI:  01/28  21:00 This 54 yrs old Asian Male presents to ER via EMS with          am51        complaints of SOB.  21:00 Arthur Young is a 54 y/o M w/ Hx of CHF (EF 35%), COPD (1/2 ppd   am51        smoker), asthma, schizoaffective disorder who presents with        cough and dyspnea on exertion x2 days. He is a member of group        home and was found to be hypoxic today to 80s on RA that        improved on supplemental oxygen. He denies chest pain,        orthopnea, worsening leg swelling, lightheadedness,        palpitations, weakness, rhinorrhea, headache. He took 4 puffs        of his albuterol inhaler with minimal improvement. He was        recently admitted at [facility] from 05/11/19 - 05/19/19 with COPD        exacerbation. He was COVID negative at the time..  .  Historical:  - Allergies: Depakote; Lithium Carbonate;  - Home Meds: Remeron Oral; Seroquel Oral; Epitol oral; ProAir HFA Inhl;    Aspirin Oral; Carbamazepine Oral; Cozaar Oral;  - PMHx: Cataract; COPD; Glaucoma; Hypertension;  - The history from nurses notes was reviewed: and I agree with    what is documented.  .  .  ROS:  21:11 Constitutional: Negative for body aches, chills, fatigue,       am51        fever, malaise.  21:11 Eyes: Negative for blurry vision, visual disturbance, vision        loss.  21:11 ENT: Positive for sore throat, Negative for rhinorrhea, swollen        nodes.  21:11 Cardiovascular: Positive for edema, Edema is not worse than        normal, Negative for chest pain, orthopnea, palpitations.  21:11 Respiratory: Positive for cough, dyspnea on exertion, shortness        of breath, wheezing, Negative for orthopnea.  21:14 Neck Negative for pain with movement, pain at rest,  swollen     am51        nodes.  21:14 Abdomen/GI: Negative for abdominal pain, nausea, vomiting,        diarrhea, constipation.  21:14 Back: Negative for pain at rest, radiated pain.  21:14 GU: Negative for urinary frequency, burning with urination.  21:14 MS/extremity: Positive for rash, Negative for swelling,  .  Name:Young, Arthur  [id number]  [id number]  Page 1 of 7  %%PAGE  .  Name: Young, Arthur  MRN: [id number]  Age: 78 yrs  Sex: Male  DOB: Jul 22, 1965  Arrival Date: 06/08/2019  Arrival Time: 19:57  Account#: [id number]  .  Working Diagnosis: COPD/ Chronic obstructive pulmonary disease with (acute)   exacerbation  PCP: Arthur Young, Amy  .        tenderness.  21:14 Neuro: Negative for dizziness, headache, Lightheadedness        syncope.  .  Vital Signs:  20:00  BP 104 / 79; Pulse 74; Resp 16; Temp 36.3(TE); Pulse Ox 100% on lp7        4 lpm NC;  20:29 BP 96 / 71 Right Arm Supine (auto/reg); Pulse 85 Monitor; Resp  nc22        31 Spontaneous; Temp 36.9; Pulse Ox 97% on 2 lpm NC;  20:48 BP 101 / 68; Pulse 84; Resp 18; Pulse Ox 95% on 2 lpm NC;       da16  22:30 BP 118 / 85; Pulse 90; Resp 22; Pulse Ox 96% on 2 lpm NC;       da16  .  Glasgow Coma Score:  20:49 Eye Response: spontaneous(4). Verbal Response: oriented(5).     da16        Motor Response: obeys commands(6). Total: 15.  .  Exam:  21:14 Head/Face:  Normocephalic, atraumatic. Eyes:  Extra-ocular      am51        motions intact.  Lids and lashes normal.  Conjunctiva and        sclera are non-icteric and not injected.  Cornea within normal        limits.  Periorbital areas with no swelling, redness, or edema.        ENT:  Nares patent. No nasal discharge, no septal abnormalities        noted.  Tympanic membranes are normal and external auditory        canals are clear.  Oropharynx with no redness, swelling, or        masses, exudates, or evidence of obstruction, uvula midline.        Mucous membrane dry Neck:  Trachea midline, no thyromegaly or        masses  palpated, and no cervical lymphadenopathy.        Chest/axilla:  Normal chest wall appearance and motion.        Nontender with no deformity.  No lesions are appreciated. Skin:         Warm, dry with normal turgor.  Normal color with chronic skin        changes over bilateral anterio lower legs, otherwise no        lesions, and no evidence of cellulitis.  21:14 Constitutional: The patient appears well nourished, alert,        Short of breath, tries to speak quickly  21:14 Respiratory:  Respirations: labored breathing, that is        moderate, prolonged exhalation, that is mild, Breath sounds:        wheezing, that is moderate, is heard diffusely.  21:14 Cardiovascular: Rate: tachycardic, Rhythm: regular, Pulses: no        pulse deficits are appreciated, Heart sounds: normal, normal        S1and S2, no murmur, no rub, no gallop, Edema: 1+ edema to        level of left midcalf and right midcalf, JVD: is not        appreciated.  .  Name:Young, Arthur  [id number]  [id number]  Page 2 of 7  %%PAGE  .  Name: Young, Arthur  MRN: [id number]  Age: 35 yrs  Sex: Male  DOB: 26-May-1965  Arrival Date: 06/08/2019  Arrival Time: 19:57  Account#: [id number]  .  Working Diagnosis: COPD/ Chronic obstructive pulmonary disease with (acute)   exacerbation  PCP: Berenice Primas  .  21:14 Abdomen/GI: Bowel sounds: normal, Palpation: abdomen is soft        and non-tender.  [name]  MDM:  21:44 Differential Diagnosis COPD exacerbation, COVID pneumonia, CHF  am51        exacerbation. ED course: Pt arrived to ED hypoxic, put on        supplemental oxygen with improvement to 90s. BP 90/40s, 500cc        NS given slowly. Given x1 Duo-Nebs, 6mg  dexamethasone.        Hypokalemia to 5.9, repeat lab sent. Duo-Nebs x2 given again.        Repeat K 6.2, BNP 862, 1amp of D50W and 10U insulin given. 30g        Kayexalate given. Data reviewed: EKG, lab test result(s),        nurses notes, old medical records, radiologic studies, plain        films, vital signs.  Counseling: I had a detailed discussion        with the patient and/or guardian regarding: the historical        points, exam findings, and any diagnostic results supporting        the admission diagnosis.  22:23 Resident chart complete and electronically signed: Adela Ports MD.  22:30 Differential diagnosis: Anemia asthma, CHF exacerbation,        ny2        Chronic Obstructive Pulmonary Disease Myocardial Infarction        pneumonia, pulmonary edema, reactive airway disease. Data        interpreted: Pulse oximetry: on 2L(s) per nasal canula, is 95        %. Response to treatment: Improved.  [name]  01/28  20:17 Order name: Glucose, Point of Care; Complete Time: 20:32        dispa  t  01/28  20:35 Order name: BUN (Blood Urea Nitrogen); Complete Time: 21:19     am51  01/28  20:35 Order name: CBC/Diff (With Plt); Complete Time: 22:18           am51  01/28  20:35 Order name: CR (Creatinine); Complete Time: 21:19               am51  01/28  20:35 Order name: GLU (Glucose); Complete Time: 21:19                 am51  01/28  20:35 Order name: LYTES (Na, K, Cl, Co2); Complete Time: 21:19        am51  01/28  20:35 Order name: Lactate; Complete Time: 21:19                       am51  01/28  20:35 Order name: PT (Prothrombin Time With INR); Complete Time: 21:19am51  01/28  20:35 Order name: PTT; Complete Time: 21:19                           am51  .  Name:Waxman, Kalum  [id number]  [id number]  Page 3 of 7  %%PAGE  .  Name: Boden, Stucky  MRN: [id number]  Age: 54 yrs  Sex: Male  DOB: 1965/07/13  Arrival Date: 06/08/2019  Arrival Time: 19:57  Account#: [id number]  .  Working Diagnosis: COPD/ Chronic obstructive pulmonary disease with (acute)   exacerbation  PCP: Berenice Primas  .  01/28  20:35 Order name: Troponin I; Complete Time: 21:19  am51  01/28  20:35 Order name: COVID-19 PCR (Symptomatic patients)                 am51  01/28  21:06 Order name: GFR, AA; Complete Time: 21:19                        dispa  t  01/28  21:06 Order name: GFR, NAA; Complete Time: 21:19                      dispa  t  01/28  21:43 Order name: Potassium (K); Complete Time: 22:18                 am51  01/28  20:35 Order name: CXR (PA/Lat); Complete Time: 21:19                  am51  01/28  20:35 Order name: Adult EKG (order using folder); Complete Time: 20:35am51  01/28  20:35 Order name: AccuCheck(POC); Complete Time: 20:50                am51  01/28  20:35 Order name: EKG (order using folder); Complete Time: 20:50      am51  01/28  21:03 Order name: ADD BNP-BRAIN NATRIURETIC PROTEIN; Complete Time:   am51        21:06  01/28  21:57 Order name: COVID-19 (SARS-CoV-2) PCR RAPID; Complete Time:     dispa  t        22:18  01/28  22:20 Order name: BNP ; Complete Time: 22:22                          dispa  t  .  ECG:  22:22 Rate is 85 beats/min. Rhythm is regular. QRS interval is        ny2        prolonged. QT interval is normal at 433 msec. T waves are        Inverted in leads V1, V2. No ST changes noted.  .  Dispensed Medications:  21:02 Drug: NS - Sodium Chloride 500 mL Route: IV; Rate: Bolus;       da16  21:22 Follow up: IV Status: Infusion discontinued; IV Intake:   da16  21:02 Drug: Decadron - Dexamethasone 6 mg Route: IV;                  da16  21:03 Drug: Duo-Neb - Albuterol/Atrovent - (Atrovent 0.5 mg,          da16        Albuterol 2.5 mg) Route: Nebulizer;  21:44 Drug: Insulin Regular Human 10 units {Co-Signature: [id number] (Sarah da16        Penni Bombard RN).} Route: IV;  21:44 Not Given (Duplicate Order): Duo-Neb - Albuterol/Atrovent -     da16        (Albuterol 2.5 mg, Atrovent 0.5 mg) Nebulizer once  .  Name:Sharpless, Rod  [id number]  [id number]  Page 4 of 7  %%PAGE  .  Name: Viraat, Vanpatten  MRN: [id number]  Age: 28 yrs  Sex: Male  DOB: 12-25-1965  Arrival Date: 06/08/2019  Arrival Time: 19:57  Account#: [id number]  .  Working Diagnosis: COPD/ Chronic obstructive pulmonary disease with (acute)   exacerbation  PCP: Berenice Primas  .  21:44 Not Given (Duplicate Order): Duo-Neb - Albuterol/Atrovent -     da16        (  Albuterol 2.5 mg, Atrovent 0.5 mg) Nebulizer once  21:44 Drug: Duo-Neb - Albuterol/Atrovent - (Atrovent 1 mg, Albuterol  da16        5 mg) Route: Nebulizer;  22:35 Drug: D50W 1 amp Route: IV;                                     da16  22:35 Drug: Kayexalate 30 grams Route: PO;                            da16  .  .  Radiology Orders:  Order Name: CXR (PA/Lat); Last Status: Reviewed; Time: 06/08/19    20:35; By: [email or web address]; For: am51; Order Method: Electronic; Notes:    Bed Name: A8  Attending Notes:  20:52 Attestation: Assessment and care plan reviewed with             ny2        resident/midlevel provider. See their note for details.        Resident's history reviewed, patient interviewed and examined.        Attending HPI: HPI: 54 yo male pmh of copd chf htn        schizoaffective d/o lives in group home, c/o cough x 1 day        nonproductive w/ exertional dyspnea no cp no fevers no        worsening LE edema; used inhaler pta w/ minimal improved        admitted beg of month for copd / chf exacerbations negative        covid testing at that time. Attending ROS Constitutional: no        fever / chills ENT: no uri sx, no sore throat, no difficulty        swallowing. Abdomen/GI: no abdominal pain / nausea / vomiting        Barron Alvine.  22:22 Attending Exam: My personal exam reveals neck supple skin warm  ny2        dry Constitutional: awake alert Head/Face: NCAT; voice normal        Cardiovascular: RR +S1 +S2 ; no pulse deficits Respiratory: the        patient does not display signs of respiratory distress,        Respirations: prolonged exhalation, Breath sounds: wheezing,        Abdomen/GI: Palpation: abdomen is soft and non-tender, Neuro:        Mentation: able to follow commands, Motor: strength is normal,        Skin: Turgor: is good, MS/Extremity: Circulation: circulation        is intact in all extremities, Calves: are  non-tender, have        equal circumference, Psych: Behavior/mood is cooperative. I        have reviewed Nurses Notes. Lab/Ancillary show: Labs were        reviewed and interpreted by me: Cr 2.15; K 5.9; bnp 827;        troponin 0.05 Xray(s) visualized and interpreted by me CXR        shows: nad Chest X-ray shows: None. Heart and Mediastinum:        There is stable cardiomegaly. The mediastinal silhouette is        unremarkable. Lungs and Pleura: The lungs are well inflated.        There are  stable hazy opacities within the medial lung bases  .  Name:Meinecke, Keijuan  [id number]  [id number]  Page 5 of 7  %%PAGE  .  Name: Jamarien, Rodkey  MRN: [id number]  Age: 1 yrs  Sex: Male  DOB: 05/03/1966  Arrival Date: 06/08/2019  Arrival Time: 19:57  Account#: [id number]  .  Working Diagnosis: COPD/ Chronic obstructive pulmonary disease with (acute)   exacerbation  PCP: Arthur Young, Amy  .        which may reflect atelectasis versus infection. There is no        focal consolidation or pulmonary edema. There is no sizable        pneumothorax or pleural effusion. Bones/Other: There are no        acute bony abnormalities. DICTATED: Rosalita Levan ,MD        SUPERVISED BY CR OVERNIGHT , IN ACCORDANCE WITH DEPARTMENT        POLICY, TEACHING PHYSICIANS REVIEW ALL IMAGES, AND EDIT REPORTS        AS REQUIRED. A REPORT IS NOT FINAL UNTIL APPROVED BY A STAFF        RADIOLOGIST. TRANSCRIBED BY: TNUANCE EKG interpreted by me and        shows:. ED Course: wheezing improved w/ duoneb / decadron.        afebrile VSS. labs w/ hyperkalemia, elevated troponin and bnp.        pt admitted for further management. My Working Impression:        Dyspnea, COPD exacerbation, CHF ; elevated troponin;        hyperkalemia.  22:30 Attending chart complete and electronically signed: Chrys Racer        MD.  .  Disposition:  22:23 My portion of the ED chart is complete / electronically signed: am51        Berton Mount MD.  .  Disposition Summary:  06/08/19  21:37  Hospitalization Ordered        Hospitalization Status: Inpatient Admission                     am51        Provider: Loleta Rose                                        am51        Location: Adult Floor                                           am51        Condition: Stable                                               am51        Problem: new                                                    am51        Symptoms: are unchanged  am51        Bed/Room Type: Regular                                          am51        Room Assignment: [facility] 7(06/08/19 22:16)                        lp7        Diagnosis          - COPD/ Chronic obstructive pulmonary disease with (acute)    am51        exacerbation        Forms:          - Art therapist Form                                  am51          - Fax Summary                                                 am51  Signatures:  Dispatcher, [employer]                          dispa  Vivien Presto                         MD   ny2  Ellard Artis                        Sec  [id number]  Powers, Lauren                          BSN  lp7  .  Name:Quinby, Rodrigues  [id number]  [id number]  Page 6 of 7  %%PAGE  .  Name: Bently, Wyss  MRN: [id number]  Age: 6 yrs  Sex: Male  DOB: Feb 19, 1966  Arrival Date: 06/08/2019  Arrival Time: 19:57  Account#: [id number]  .  Working Diagnosis: COPD/ Chronic obstructive pulmonary disease with (acute)   exacerbation  PCP: Berenice Primas  .  Gillis Santa                           Reg  bo2  Arnette Norris                              RN   da16  Berton Mount                          MD   am51  Solon Palm RN                             [phone]  .  Corrections: (The following items were deleted from the chart)  21:04 21:00 Mr. Dolata is a 54 y/o M w/ Hx of CHF (EF 35%), COPD (1/2 am56  ppd smoker), asthma, schizoaffective disorder who presents with        cough and dyspnea on exertion x2 days. He is a member  of group        home. He denies chest pain, orthopnea, worsening leg swelling,        lightheadedness, palpitations, weakness, rhinorrhea, headache.        He took 4 puffs of his albuterol inhaler with minimal        improvement. He was recently admitted at [facility] from 05/11/19 -        05/19/19 with COPD exacerbation. He was COVID negative at the        time.[name] am51  21:44 21:37 am51                                                      lp7  22:16 21:44 *PENDING BED* lp7                                         lp7  22:24 21:44 ED course: Pt arrived to ED hypoxic, put on supplemental  am51        oxygen with improvement to 90s. BP 90/40s, 500cc NS given        slowly. Given x1 Duo-Nebs, 6mg  dexamethasone. Hypokalemia to        5.9, repeat lab sent. Duo-Nebs x2 given again. 1amp of D50W and        10U insulin prepared. am51  22:25 21:44 ED course: Pt arrived to ED hypoxic, put on supplemental  am51        oxygen with improvement to 90s. BP 90/40s, 500cc NS given        slowly. Given x1 Duo-Nebs, 6mg  dexamethasone. Hypokalemia to        5.9, repeat lab sent. Duo-Nebs x2 given again. Repeat K 6.2,        1amp of D50W and 10U insulin given. 30g Kayexalate given. am51  .  Document is preliminary until electronically or manually signed by the atte  nding physician  .  .  .  .  .  .  .  .  .  .  .  .  .  .  Name:Sweatman, Ellen  [id number]  [id number]  Page 7 of 7  .  %%END

## 2019-06-08 NOTE — ED Provider Notes (Signed)
.  .  Name: Arthur Young, Arthur Young  MRN: [id number]  Age: 54 yrs  Sex: Male  DOB: 1965-07-10  Arrival Date: 06/08/2019  Arrival Time: 19:57  Account#: [id number]  Bed Pending Adult  PCP: Berenice Primas  Chief Complaint: SOB  .  Presentation:  01/28  19:58 Presenting complaint: EMS states: Pt brought in from group      lp7        home. pt c.o of SOB. pt found to have O2 sat in high 80'S pt        placed on 2 LNC increased to 100%. Pt took 4 puffs of albuterol        MDI.  19:58 Method Of Arrival: EMS: [facility]                              lp7  19:58 Acuity: Adult 3                                                 lp7  .  Historical:  - Allergies:  19:59 Depakote;                                                       lp7  19:59 Lithium Carbonate;                                              lp7  - Home Meds:  19:59 Remeron Oral [Active]; Seroquel Oral [Active]; Epitol oral      lp7        [Active]; ProAir HFA Inhl [Active]; Aspirin Oral [Active];        Carbamazepine Oral [Active]; Cozaar Oral [Active];  - PMHx:  19:59 Cataract; COPD; Glaucoma; Hypertension;                         lp7  .  - The history from nurses notes was reviewed: and I agree with    what is documented.  .  .  Screening:  20:05 SEPSIS SCREENING - Temp > 38.3 or < 36.0 No - Heart Rate > 90   lp7        No - Respiratory > 20 No - SBP < 90 No Does this patient have a        suspected source of infection at this timequestion No SIRS Criteria (>        = 2) No. Exposure Risk/Travel Screening: COVID Symptomsquestion        Difficulty Breathing. Known COVID 19 exposurequestion No. DPH requests        Isolationquestion(COVID) No. Have you tested + for COVIDquestion No.  .  Vital Signs:  20:00 BP 104 / 79; Pulse 74; Resp 16; Temp 36.3(TE); Pulse Ox 100% on lp7        4 lpm NC;  20:29 BP 96 / 71 Right Arm Supine (auto/reg); Pulse 85 Monitor; Resp  nc22        31 Spontaneous; Temp 36.9; Pulse  Ox 97% on 2 lpm NC;  20:48 BP 101 / 68; Pulse 84; Resp 18; Pulse Ox 95% on 2 lpm  NC;       da16  22:30 BP 118 / 85; Pulse 90; Resp 22; Pulse Ox 96% on 2 lpm NC;       da16  .  Glasgow Coma Score:  20:49 Eye Response: spontaneous(4). Verbal Response: oriented(5).     da16  .  Name:Arthur Young, Arthur Young  ZOX:[id number]  [id number]  Page 1 of 3  %%PAGE  .  Name: Arthur Young, Arthur Young  MRN: [id number]  Age: 73 yrs  Sex: Male  DOB: 1965/12/25  Arrival Date: 06/08/2019  Arrival Time: 19:57  Account#: [id number]  Bed Pending Adult  PCP: Berenice Primas  Chief Complaint: SOB  .        Motor Response: obeys commands(6). Total: 15.  .  Triage Assessment:  20:00 General: Appears comfortable, Behavior is cooperative. Pain:    lp7        Denies pain. Neuro: Eye opening: Spontaneously Level on        consciousness: Sustained Attention Verbal Response:        Orientation:. Respiratory: Airway is patent Reports shortness        of breath. Skin: Skin is pink, warm / dry.  .  Assessment:  20:49 General: Appears in no apparent distress, Behavior is           da16        cooperative. Neuro: No deficits noted. Cardiovascular:        Capillary refill < 3 seconds Rhythm is sinus rhythm.        Respiratory: Airway is patent Respiratory effort is even,        labored, Respiratory pattern is regular, symmetrical, Breath        sounds are diminished bilaterally. Breath sounds with wheezes        bilaterally. GI: No deficits noted. GU: No deficits noted.  .  Observations:  19:57 Patient arrived in ED.                                          lp7  19:59 Triage Completed.                                               lp7  20:00 Registration completed.                                         bo2  20:00 Patient Visited By: Gillis Santa                               bo2  20:22 Patient Visited By: Adela Ports  20:30 Patient Visited By: Vivien Presto                             ny2  21:44 Patient assigned to  A8                                          lp7  22:16 Patient assigned to A8                                           lp7  .  Procedure:  20:46 COVID-19 PCR (Symptomatic patients) Sent.                       da16  20:49 Inserted peripheral IV: 18 gauge in right forearm and blood     da16        (including any ordered blood cultures) drawn. June 08, 2019        at 20:40.  21:44 Potassium (K) Sent.                                             da16  .  Dispensed Medications:  21:02 Drug: NS - Sodium Chloride 500 mL Route: IV; Rate: Bolus;       da16  21:22 Follow up: IV Status: Infusion discontinued; IV Intake:   da16  21:02 Drug: Decadron - Dexamethasone 6 mg Route: IV;                  da16  21:03 Drug: Duo-Neb - Albuterol/Atrovent - (Atrovent 0.5 mg,          da16        Albuterol 2.5 mg) Route: Nebulizer;  21:44 Drug: Insulin Regular Human 10 units {Co-Signature: sk34 (Sarah da16  .  Name:Arthur Young, Arthur Young  ZCH:[id number]  [id number]  Page 2 of 3  %%PAGE  .  Name: Arthur Young, Arthur Young  MRN: [id number]  Age: 20 yrs  Sex: Male  DOB: 02-10-1966  Arrival Date: 06/08/2019  Arrival Time: 19:57  Account#: [id number]  Bed Pending Adult  PCP: Berenice Primas  Chief Complaint: SOB  .        Penni Bombard RN).} Route: IV;  21:44 Not Given (Duplicate Order): Duo-Neb - Albuterol/Atrovent -     da16        (Albuterol 2.5 mg, Atrovent 0.5 mg) Nebulizer once  21:44 Not Given (Duplicate Order): Duo-Neb - Albuterol/Atrovent -     da16        (Albuterol 2.5 mg, Atrovent 0.5 mg) Nebulizer once  21:44 Drug: Duo-Neb - Albuterol/Atrovent - (Atrovent 1 mg, Albuterol  da16        5 mg) Route: Nebulizer;  22:35 Drug: D50W 1 amp Route: IV;                                     da16  22:35 Drug: Kayexalate 30 grams Route: PO;                            da16  .  [name]  Intake:  21:22 IV: 400.56ml; Total: 400.57ml.  da16  .  Interventions:  20:00 Patient placed in exam room.                                    lp7  20:10 Demo Sheet Scanned into Chart                                   dw  20:19 Outside Patient Records Scanned into Chart                       jm49  20:23 EMS Sheet Scanned into Chart                                    dw  20:34 ECG/EKG Scanned into Chart                                      dw  20:45 POC Test Accucheck POC is 160 RN notified of POC results.       jr26  22:16 Patient Belongings Scanned into Chart                           jm49  .  Outcome:  21:37 Decision to Hospitalize by Provider.                            am51  23:36 Admitted to [place] [facility] accompanied by tech, via stretcher, with    lp7        chart. Condition: good. Chart Status Nursing note complete and        electronically signed.  23:36 Patient left the ED.                                            lp7  .  Signatures:  [place] Charon, Delta                                  dw  Vivien Presto                         MD   ny2  Ellard Artis                        Sec  jm49  Powers, Lauren                          BSN  lp7  Verdel, Twisp                               jr26  Gillis Santa                           Reg  bo2  Arnette Norris  RN   da16  Berton Mount                          MD   am51  [place], [place]                              CCT  nc22  Solon Palm RN                             [phone]  .  [name]  Name:Arthur Young, Arthur Young  [id number]  [id number]  Page 3 of 3  %%PAGE  .  %%END

## 2019-06-09 LAB — HX CHEM-PANELS
HX ANION GAP: 9 (ref 3–14)
HX ANION GAP: 12 (ref 3–14)
HX ANION GAP: 12 (ref 3–14)
HX ANION GAP: 7 (ref 3–14)
HX BLOOD UREA NITROGEN: 43 mg/dL — ABNORMAL HIGH (ref 6–24)
HX BLOOD UREA NITROGEN: 44 mg/dL — ABNORMAL HIGH (ref 6–24)
HX BLOOD UREA NITROGEN: 46 mg/dL — ABNORMAL HIGH (ref 6–24)
HX BLOOD UREA NITROGEN: 50 mg/dL — ABNORMAL HIGH (ref 6–24)
HX CHLORIDE (CL): 100 meq/L (ref 98–110)
HX CHLORIDE (CL): 98 meq/L (ref 98–110)
HX CHLORIDE (CL): 95 meq/L — ABNORMAL LOW (ref 98–110)
HX CHLORIDE (CL): 95 meq/L — ABNORMAL LOW (ref 98–110)
HX CO2: 24 meq/L (ref 20–30)
HX CO2: 23 meq/L (ref 20–30)
HX CO2: 29 meq/L (ref 20–30)
HX CO2: 33 meq/L — ABNORMAL HIGH (ref 20–30)
HX CREATININE (CR): 2.29 mg/dL — ABNORMAL HIGH (ref 0.57–1.30)
HX CREATININE (CR): 2.19 mg/dL — ABNORMAL HIGH (ref 0.57–1.30)
HX CREATININE (CR): 2.22 mg/dL — ABNORMAL HIGH (ref 0.57–1.30)
HX CREATININE (CR): 2.34 mg/dL — ABNORMAL HIGH (ref 0.57–1.30)
HX GFR, AFRICAN AMERICAN: 36 mL/min/{1.73_m2} — ABNORMAL LOW
HX GFR, AFRICAN AMERICAN: 38 mL/min/{1.73_m2} — ABNORMAL LOW
HX GFR, AFRICAN AMERICAN: 37 mL/min/{1.73_m2} — ABNORMAL LOW
HX GFR, AFRICAN AMERICAN: 35 mL/min/{1.73_m2} — ABNORMAL LOW
HX GFR, NON-AFRICAN AMERICAN: 31 mL/min/{1.73_m2} — ABNORMAL LOW
HX GFR, NON-AFRICAN AMERICAN: 33 mL/min/{1.73_m2} — ABNORMAL LOW
HX GFR, NON-AFRICAN AMERICAN: 32 mL/min/{1.73_m2} — ABNORMAL LOW
HX GFR, NON-AFRICAN AMERICAN: 30 mL/min/{1.73_m2} — ABNORMAL LOW
HX GLUCOSE: 126 mg/dL (ref 70–139)
HX GLUCOSE: 209 mg/dL — ABNORMAL HIGH (ref 70–139)
HX POTASSIUM (K): 5.7 meq/L — ABNORMAL HIGH (ref 3.6–5.1)
HX POTASSIUM (K): 6.1 meq/L — AB (ref 3.6–5.1)
HX POTASSIUM (K): 5.6 meq/L — ABNORMAL HIGH (ref 3.6–5.1)
HX POTASSIUM (K): 4.9 meq/L (ref 3.6–5.1)
HX SODIUM (NA): 133 meq/L — ABNORMAL LOW (ref 135–145)
HX SODIUM (NA): 133 meq/L — ABNORMAL LOW (ref 135–145)
HX SODIUM (NA): 136 meq/L (ref 135–145)
HX SODIUM (NA): 135 meq/L (ref 135–145)

## 2019-06-09 LAB — HX CHEM-OTHER
HX % IRON SATURATION: 15 % — ABNORMAL LOW (ref 25–72)
HX CALCIUM (CA): 8 mg/dL — ABNORMAL LOW (ref 8.5–10.5)
HX FERRITIN: 47 ng/mL (ref 22–277)
HX IRON: 62 ug/dL (ref 49–181)
HX MAGNESIUM: 2.5 mg/dL (ref 1.6–2.6)
HX MAGNESIUM: 2.3 mg/dL (ref 1.6–2.6)
HX MAGNESIUM: 2.1 mg/dL (ref 1.6–2.6)
HX MAGNESIUM: 2.2 mg/dL (ref 1.6–2.6)
HX PHOSPHORUS: 6.3 mg/dL — ABNORMAL HIGH (ref 2.7–4.5)
HX PHOSPHORUS: 5.8 mg/dL — ABNORMAL HIGH (ref 2.7–4.5)
HX TOTAL IRON BINDING CAPACITY: 416 ug/dL (ref 253–463)
HX TRANSFERRIN: 297 mg/dL (ref 181–331)

## 2019-06-09 LAB — HX CHEM-METABOLIC
HX FOLATE, SERUM: 17.1 ng/mL (ref >=4.0)
HX THYROID STIMULATING HORMONE (TSH): 0.43 u[IU]/mL (ref 0.35–4.94)

## 2019-06-09 LAB — HX HEM-ROUTINE
HX BASO #: 0 10*3/uL (ref 0.0–0.2)
HX BASO: 0 %
HX EOSIN #: 0 10*3/uL (ref 0.0–0.5)
HX EOSIN: 0 %
HX HCT: 54.5 % — ABNORMAL HIGH (ref 37.0–47.0)
HX HGB: 16.6 g/dL — ABNORMAL HIGH (ref 13.5–16.0)
HX IMMATURE GRANULOCYTE#: 0 10*3/uL (ref 0.0–0.1)
HX IMMATURE GRANULOCYTE: 1 %
HX LYMPH #: 1.4 10*3/uL (ref 1.0–4.0)
HX LYMPH: 16 %
HX MCH: 30.4 pg (ref 26.0–34.0)
HX MCHC: 30.5 g/dL — ABNORMAL LOW (ref 32.0–36.0)
HX MCV: 99.8 fL — ABNORMAL HIGH (ref 80.0–98.0)
HX MONO #: 0.7 10*3/uL (ref 0.2–0.8)
HX MONO: 8 %
HX MPV: 10.2 fL (ref 9.1–11.7)
HX NEUT #: 6.8 10*3/uL (ref 1.5–7.5)
HX NRBC #: 0 10*3/uL
HX NUCLEATED RBC: 0 %
HX PLT: 161 10*3/uL (ref 150–400)
HX RBC BLOOD COUNT: 5.46 M/uL (ref 4.20–5.50)
HX RDW: 13.7 % (ref 11.5–14.5)
HX SEG NEUT: 76 %
HX WBC: 9 10*3/uL (ref 4.0–11.0)

## 2019-06-09 LAB — HX DIABETES
HX GLUCOSE: 126 mg/dL (ref 70–139)
HX GLUCOSE: 209 mg/dL — ABNORMAL HIGH (ref 70–139)

## 2019-06-09 LAB — HX POINT OF CARE
HX GLUCOSE-POCT: 118 mg/dL (ref 70–139)
HX GLUCOSE-POCT: 228 mg/dL — ABNORMAL HIGH (ref 70–139)

## 2019-06-09 LAB — HX CHEM-VITAMIN: HX VITAMIN B12: 1031 pg/mL — ABNORMAL HIGH (ref 211–911)

## 2019-06-09 LAB — HX CHEM-ENZ-FRAC: HX TROPONIN I: 0.04 ng/mL — ABNORMAL HIGH (ref 0.00–0.03)

## 2019-06-10 LAB — HX CHEM-PANELS
HX ANION GAP: 8 (ref 3–14)
HX ANION GAP: 7 (ref 3–14)
HX ANION GAP: 8 (ref 3–14)
HX BLOOD UREA NITROGEN: 45 mg/dL — ABNORMAL HIGH (ref 6–24)
HX BLOOD UREA NITROGEN: 38 mg/dL — ABNORMAL HIGH (ref 6–24)
HX BLOOD UREA NITROGEN: 40 mg/dL — ABNORMAL HIGH (ref 6–24)
HX CHLORIDE (CL): 94 meq/L — ABNORMAL LOW (ref 98–110)
HX CHLORIDE (CL): 91 meq/L — ABNORMAL LOW (ref 98–110)
HX CHLORIDE (CL): 89 meq/L — ABNORMAL LOW (ref 98–110)
HX CO2: 37 meq/L — ABNORMAL HIGH (ref 20–30)
HX CO2: 41 meq/L — AB (ref 20–30)
HX CO2: 45 meq/L — AB (ref 20–30)
HX CREATININE (CR): 2.11 mg/dL — ABNORMAL HIGH (ref 0.57–1.30)
HX CREATININE (CR): 1.92 mg/dL — ABNORMAL HIGH (ref 0.57–1.30)
HX CREATININE (CR): 1.86 mg/dL — ABNORMAL HIGH (ref 0.57–1.30)
HX GFR, AFRICAN AMERICAN: 40 mL/min/{1.73_m2} — ABNORMAL LOW
HX GFR, AFRICAN AMERICAN: 44 mL/min/{1.73_m2} — ABNORMAL LOW
HX GFR, AFRICAN AMERICAN: 46 mL/min/{1.73_m2} — ABNORMAL LOW
HX GFR, NON-AFRICAN AMERICAN: 34 mL/min/{1.73_m2} — ABNORMAL LOW
HX GFR, NON-AFRICAN AMERICAN: 38 mL/min/{1.73_m2} — ABNORMAL LOW
HX GFR, NON-AFRICAN AMERICAN: 40 mL/min/{1.73_m2} — ABNORMAL LOW
HX GLUCOSE: 207 mg/dL — ABNORMAL HIGH (ref 70–139)
HX POTASSIUM (K): 4.6 meq/L (ref 3.6–5.1)
HX POTASSIUM (K): 4.4 meq/L (ref 3.6–5.1)
HX POTASSIUM (K): 4.1 meq/L (ref 3.6–5.1)
HX SODIUM (NA): 139 meq/L (ref 135–145)
HX SODIUM (NA): 139 meq/L (ref 135–145)
HX SODIUM (NA): 142 meq/L (ref 135–145)

## 2019-06-10 LAB — HX HEM-ROUTINE
HX HCT: 50.7 % — ABNORMAL HIGH (ref 37.0–47.0)
HX HGB: 15.6 g/dL (ref 13.5–16.0)
HX MCH: 30.8 pg (ref 26.0–34.0)
HX MCHC: 30.8 g/dL — ABNORMAL LOW (ref 32.0–36.0)
HX MCV: 100 fL — ABNORMAL HIGH (ref 80.0–98.0)
HX MPV: 10 fL (ref 9.1–11.7)
HX NRBC #: 0 10*3/uL
HX NUCLEATED RBC: 0 %
HX PLT: 150 10*3/uL (ref 150–400)
HX RBC BLOOD COUNT: 5.07 M/uL (ref 4.20–5.50)
HX RDW: 13.8 % (ref 11.5–14.5)
HX WBC: 8.9 10*3/uL (ref 4.0–11.0)

## 2019-06-10 LAB — HX CHEM-OTHER
HX CALCIUM (CA): 8.5 mg/dL (ref 8.5–10.5)
HX MAGNESIUM: 2.1 mg/dL (ref 1.6–2.6)
HX MAGNESIUM: 2 mg/dL (ref 1.6–2.6)
HX MAGNESIUM: 2 mg/dL (ref 1.6–2.6)
HX PHOSPHORUS: 5.3 mg/dL — ABNORMAL HIGH (ref 2.7–4.5)
HX PHOSPHORUS: 3.6 mg/dL (ref 2.7–4.5)
HX PHOSPHORUS: 3.3 mg/dL (ref 2.7–4.5)

## 2019-06-10 LAB — HX POINT OF CARE
HX GLUCOSE-POCT: 156 mg/dL — ABNORMAL HIGH (ref 70–139)
HX GLUCOSE-POCT: 221 mg/dL — ABNORMAL HIGH (ref 70–139)
HX GLUCOSE-POCT: 125 mg/dL (ref 70–139)

## 2019-06-10 LAB — HX DIABETES: HX GLUCOSE: 207 mg/dL — ABNORMAL HIGH (ref 70–139)

## 2019-06-11 LAB — HX HEM-ROUTINE
HX HCT: 50.6 % — ABNORMAL HIGH (ref 37.0–47.0)
HX HGB: 15.6 g/dL (ref 13.5–16.0)
HX MCH: 30 pg (ref 26.0–34.0)
HX MCHC: 30.8 g/dL — ABNORMAL LOW (ref 32.0–36.0)
HX MCV: 97.3 fL (ref 80.0–98.0)
HX MPV: 9.6 fL (ref 9.1–11.7)
HX NRBC #: 0 10*3/uL
HX NUCLEATED RBC: 0 %
HX PLT: 144 10*3/uL — ABNORMAL LOW (ref 150–400)
HX RBC BLOOD COUNT: 5.2 M/uL (ref 4.20–5.50)
HX RDW: 13.5 % (ref 11.5–14.5)
HX WBC: 7.4 10*3/uL (ref 4.0–11.0)

## 2019-06-11 LAB — HX CHEM-PANELS
HX ANION GAP: 10 (ref 3–14)
HX ANION GAP: 10 (ref 3–14)
HX BLOOD UREA NITROGEN: 29 mg/dL — ABNORMAL HIGH (ref 6–24)
HX BLOOD UREA NITROGEN: 30 mg/dL — ABNORMAL HIGH (ref 6–24)
HX CHLORIDE (CL): 91 meq/L — ABNORMAL LOW (ref 98–110)
HX CHLORIDE (CL): 86 meq/L — ABNORMAL LOW (ref 98–110)
HX CO2: 40 meq/L — AB (ref 20–30)
HX CO2: 41 meq/L — AB (ref 20–30)
HX CREATININE (CR): 1.39 mg/dL — ABNORMAL HIGH (ref 0.57–1.30)
HX CREATININE (CR): 1.57 mg/dL — ABNORMAL HIGH (ref 0.57–1.30)
HX GFR, AFRICAN AMERICAN: 65 mL/min/{1.73_m2}
HX GFR, AFRICAN AMERICAN: 57 mL/min/{1.73_m2} — ABNORMAL LOW
HX GFR, NON-AFRICAN AMERICAN: 56 mL/min/{1.73_m2} — ABNORMAL LOW
HX GFR, NON-AFRICAN AMERICAN: 49 mL/min/{1.73_m2} — ABNORMAL LOW
HX GLUCOSE: 157 mg/dL — ABNORMAL HIGH (ref 70–139)
HX GLUCOSE: 311 mg/dL — ABNORMAL HIGH (ref 70–139)
HX POTASSIUM (K): 3.5 meq/L — ABNORMAL LOW (ref 3.6–5.1)
HX POTASSIUM (K): 4.5 meq/L (ref 3.6–5.1)
HX SODIUM (NA): 141 meq/L (ref 135–145)
HX SODIUM (NA): 137 meq/L (ref 135–145)

## 2019-06-11 LAB — HX POINT OF CARE
HX GLUCOSE-POCT: 206 mg/dL — ABNORMAL HIGH (ref 70–139)
HX GLUCOSE-POCT: 128 mg/dL (ref 70–139)
HX GLUCOSE-POCT: 274 mg/dL — ABNORMAL HIGH (ref 70–139)
HX GLUCOSE-POCT: 91 mg/dL (ref 70–139)

## 2019-06-11 LAB — HX DIABETES
HX GLUCOSE: 157 mg/dL — ABNORMAL HIGH (ref 70–139)
HX GLUCOSE: 311 mg/dL — ABNORMAL HIGH (ref 70–139)

## 2019-06-11 LAB — HX CHEM-OTHER
HX CALCIUM (CA): 8.7 mg/dL (ref 8.5–10.5)
HX MAGNESIUM: 1.9 mg/dL (ref 1.6–2.6)
HX MAGNESIUM: 1.9 mg/dL (ref 1.6–2.6)
HX PHOSPHORUS: 3.6 mg/dL (ref 2.7–4.5)

## 2019-06-12 LAB — HX HEM-ROUTINE
HX BASO #: 0 10*3/uL (ref 0.0–0.2)
HX BASO: 1 %
HX EOSIN #: 0.1 10*3/uL (ref 0.0–0.5)
HX EOSIN: 2 %
HX HCT: 56.2 % — ABNORMAL HIGH (ref 37.0–47.0)
HX HGB: 17.4 g/dL — ABNORMAL HIGH (ref 13.5–16.0)
HX IMMATURE GRANULOCYTE#: 0 10*3/uL (ref 0.0–0.1)
HX IMMATURE GRANULOCYTE: 0 %
HX LYMPH #: 1.3 10*3/uL (ref 1.0–4.0)
HX LYMPH: 17 %
HX MCH: 29.9 pg (ref 26.0–34.0)
HX MCHC: 31 g/dL — ABNORMAL LOW (ref 32.0–36.0)
HX MCV: 96.6 fL (ref 80.0–98.0)
HX MONO #: 1 10*3/uL — ABNORMAL HIGH (ref 0.2–0.8)
HX MONO: 13 %
HX MPV: 9.7 fL (ref 9.1–11.7)
HX NEUT #: 5.4 10*3/uL (ref 1.5–7.5)
HX NRBC #: 0 10*3/uL
HX NUCLEATED RBC: 0 %
HX PLT: 164 10*3/uL (ref 150–400)
HX RBC BLOOD COUNT: 5.82 M/uL — ABNORMAL HIGH (ref 4.20–5.50)
HX RDW: 13.6 % (ref 11.5–14.5)
HX SEG NEUT: 68 %
HX WBC: 8 10*3/uL (ref 4.0–11.0)

## 2019-06-12 LAB — HX CHEM-PANELS
HX ANION GAP: 10 (ref 3–14)
HX ANION GAP: 12 (ref 3–14)
HX BLOOD UREA NITROGEN: 33 mg/dL — ABNORMAL HIGH (ref 6–24)
HX BLOOD UREA NITROGEN: 38 mg/dL — ABNORMAL HIGH (ref 6–24)
HX CHLORIDE (CL): 84 meq/L — ABNORMAL LOW (ref 98–110)
HX CHLORIDE (CL): 81 meq/L — ABNORMAL LOW (ref 98–110)
HX CO2: 44 meq/L — AB (ref 20–30)
HX CO2: 40 meq/L — AB (ref 20–30)
HX CREATININE (CR): 1.65 mg/dL — ABNORMAL HIGH (ref 0.57–1.30)
HX CREATININE (CR): 1.96 mg/dL — ABNORMAL HIGH (ref 0.57–1.30)
HX GFR, AFRICAN AMERICAN: 53 mL/min/{1.73_m2} — ABNORMAL LOW
HX GFR, AFRICAN AMERICAN: 43 mL/min/{1.73_m2} — ABNORMAL LOW
HX GFR, NON-AFRICAN AMERICAN: 46 mL/min/{1.73_m2} — ABNORMAL LOW
HX GFR, NON-AFRICAN AMERICAN: 37 mL/min/{1.73_m2} — ABNORMAL LOW
HX GLUCOSE: 173 mg/dL — ABNORMAL HIGH (ref 70–139)
HX GLUCOSE: 292 mg/dL — ABNORMAL HIGH (ref 70–139)
HX POTASSIUM (K): 4 meq/L (ref 3.6–5.1)
HX POTASSIUM (K): 5.2 meq/L — ABNORMAL HIGH (ref 3.6–5.1)
HX SODIUM (NA): 138 meq/L (ref 135–145)
HX SODIUM (NA): 133 meq/L — ABNORMAL LOW (ref 135–145)

## 2019-06-12 LAB — HX CHEM-BLOODGAS
HX BASE EXCESS: 20.4 mmol/L
HX BICARBONATE: 44 meq/L — AB (ref 21–28)
HX CALCULATED CO2: 36 meq/L — ABNORMAL HIGH (ref 17–32)
HX CARBOXYHEMOGLOBIN: 1.2 % (ref 0.0–3.0)
HX METHEMOGLOBIN: 0.5 % (ref 0.0–1.8)
HX OXYGEN SATURATION, MEASURED (SVO2): 89.6 % — ABNORMAL LOW (ref 95.0–98.0)
HX PCO2: 55 mmHg — ABNORMAL HIGH (ref 35–45)
HX PH: 7.51 — ABNORMAL HIGH (ref 7.35–7.45)
HX PO2: 55 mmHg — ABNORMAL LOW (ref 85–105)

## 2019-06-12 LAB — HX CHEM-OTHER
HX CALCIUM (CA): 9.6 mg/dL (ref 8.5–10.5)
HX MAGNESIUM: 1.8 mg/dL (ref 1.6–2.6)
HX MAGNESIUM: 2 mg/dL (ref 1.6–2.6)
HX PHOSPHORUS: 3.8 mg/dL (ref 2.7–4.5)

## 2019-06-12 LAB — HX POINT OF CARE
HX GLUCOSE-POCT: 182 mg/dL — ABNORMAL HIGH (ref 70–139)
HX GLUCOSE-POCT: 198 mg/dL — ABNORMAL HIGH (ref 70–139)
HX GLUCOSE-POCT: 242 mg/dL — ABNORMAL HIGH (ref 70–139)
HX GLUCOSE-POCT: 98 mg/dL (ref 70–139)

## 2019-06-12 LAB — HX DIABETES
HX GLUCOSE: 173 mg/dL — ABNORMAL HIGH (ref 70–139)
HX GLUCOSE: 292 mg/dL — ABNORMAL HIGH (ref 70–139)

## 2019-06-13 LAB — HX HEM-ROUTINE
HX HCT: 57.2 % — ABNORMAL HIGH (ref 37.0–47.0)
HX HGB: 18 g/dL — ABNORMAL HIGH (ref 13.5–16.0)
HX MCH: 30.3 pg (ref 26.0–34.0)
HX MCHC: 31.5 g/dL — ABNORMAL LOW (ref 32.0–36.0)
HX MCV: 96.1 fL (ref 80.0–98.0)
HX MPV: 10 fL (ref 9.1–11.7)
HX NRBC #: 0 10*3/uL
HX NUCLEATED RBC: 0 %
HX PLT: 175 10*3/uL (ref 150–400)
HX RBC BLOOD COUNT: 5.95 M/uL — ABNORMAL HIGH (ref 4.20–5.50)
HX RDW: 13.5 % (ref 11.5–14.5)
HX WBC: 8.2 10*3/uL (ref 4.0–11.0)

## 2019-06-13 LAB — HX CHEM-PANELS
HX ANION GAP: 13 (ref 3–14)
HX BLOOD UREA NITROGEN: 46 mg/dL — ABNORMAL HIGH (ref 6–24)
HX CHLORIDE (CL): 89 meq/L — ABNORMAL LOW (ref 98–110)
HX CO2: 32 meq/L — ABNORMAL HIGH (ref 20–30)
HX CREATININE (CR): 1.86 mg/dL — ABNORMAL HIGH (ref 0.57–1.30)
HX GFR, AFRICAN AMERICAN: 46 mL/min/{1.73_m2} — ABNORMAL LOW
HX GFR, NON-AFRICAN AMERICAN: 40 mL/min/{1.73_m2} — ABNORMAL LOW
HX GLUCOSE: 192 mg/dL — ABNORMAL HIGH (ref 70–139)
HX POTASSIUM (K): 4.6 meq/L (ref 3.6–5.1)
HX SODIUM (NA): 134 meq/L — ABNORMAL LOW (ref 135–145)

## 2019-06-13 LAB — HX POINT OF CARE: HX GLUCOSE-POCT: 164 mg/dL — ABNORMAL HIGH (ref 70–139)

## 2019-06-13 LAB — HX CHEM-OTHER
HX CALCIUM (CA): 9.2 mg/dL (ref 8.5–10.5)
HX MAGNESIUM: 2.5 mg/dL (ref 1.6–2.6)
HX PHOSPHORUS: 3.7 mg/dL (ref 2.7–4.5)

## 2019-06-13 LAB — HX DIABETES: HX GLUCOSE: 192 mg/dL — ABNORMAL HIGH (ref 70–139)

## 2019-06-14 ENCOUNTER — Ambulatory Visit: Admit: 2019-06-14 | Payer: Medicare Other

## 2019-06-14 ENCOUNTER — Ambulatory Visit: Admitting: Pulmonary Disease

## 2019-06-14 ENCOUNTER — Ambulatory Visit: Admitting: Internal Medicine

## 2019-06-14 ENCOUNTER — Ambulatory Visit

## 2019-06-14 MED ORDER — Flovent HFA: 110 | inhaler | Freq: Two times a day (BID) | 2 refills | 0 days | Status: AC

## 2019-06-14 MED ORDER — Flovent HFA: 110 | 1 | Freq: Two times a day (BID) | 0 refills | 0 days | Status: AC

## 2019-06-14 NOTE — Progress Notes (Signed)
* * *      BUTLER, VEGH **DOB:** 1965/11/07 (54 yo M) **Acc No.** [id number] **DOS:**  06/14/2019    ---       [name]**    ------    9 Y old Male, DOB: [birth year]    [address] [place], [place] [address]    Home: [phone]    Provider: Mena Goes, NP        * * *    Telephone Encounter    ---    Answered by  Mena Goes Date: 06/14/2019       Time: 04:19 PM    Caller  C Shauntee Karp    ------            Reason  refill            Refills Continue Flovent HFA Aerosol, 110 MCG/ACT, Inhalation, 1, 2 puffs,  Twice a day, 30 days, Refills=0    ------          * * *                ---          * * *         Provider: Mena Goes, NP 06/14/2019    ---    Note generated by eClinicalWorks EMR/PM Software ([email or web address])

## 2019-06-14 NOTE — Progress Notes (Signed)
* * *      Arthur Young, Arthur Young **DOB:** [birth year] (54 yo M) **Acc No.** [id number] **DOS:**  06/14/2019    ---       Arthur Young**    ------    24 Y old Male, DOB: 03-30-1966    [address], [place], [place] [address]    Home: [phone]    Provider: Sharyn Lull, MD        * * *    Telephone Encounter    ---    Answered by  Leeroy Bock Date: 06/14/2019       Time: 11:29 AM    Caller  lan le/ [facility]    ------            Reason  Rx            Message                     Hello,       This patient was discharged from the hospital yesterday diagnosed with COPD. The next appointment I could find was 3/12. He left the hospital with an inhaler with 10 to 15 puffs left. The inhaler is called solvent SFA 110 mcg. The patient will need a refill and is hoping we can prescribe him an inhaler to get him to his appointment. If his appointment should be moved up please contact [facility] at [phone]. Thanks                 Action Taken                     Schaefer,Erin  06/14/2019 11:35:37 AM >      Lockhart,Cherel  06/14/2019 12:22:21 PM >      Ou,George  06/14/2019 1:20:21 PM >      Shenouda Genova , MD 06/14/2019 8:19:44 PM > Prescription was faxed/electronically transferred to pharmacy.                Refills Continue Flovent HFA Aerosol, 110 MCG/ACT, Inhalation, 1 inhaler, 2  puffs, Twice a day, 30 days, Refills=2    ------          * * *                ---          * * *         Provider: Sharyn Lull, MD 06/14/2019    ---    Note generated by eClinicalWorks EMR/PM Software ([email or web address])

## 2019-06-19 ENCOUNTER — Ambulatory Visit: Admit: 2019-06-19 | Payer: Medicare Other

## 2019-06-19 ENCOUNTER — Ambulatory Visit: Admitting: Internal Medicine

## 2019-06-19 ENCOUNTER — Ambulatory Visit: Admitting: Acute Care

## 2019-06-19 MED ORDER — Tessalon Perles: 100 | Capsule | Freq: Three times a day (TID) | 0 refills | 0 days

## 2019-06-19 NOTE — Progress Notes (Signed)
 * * SLYVESTER, LATONA **DOB:** [birth year] (54 yo M) **Acc No.** [id number] **DOS:**  06/19/2019    ---       [name]**    ------    23 Y old Male, DOB: Jun 15, 1965, External MRN: [id number]    Account Number: [id number]    [address], [id number]    Home: [phone]    Insurance: M80 [employer]/ONE [employer]    PCP: Berenice Primas, NP Referring: Berenice Primas, NP    Appointment Facility: [facility]        * * *    06/19/2019  **Appointment Provider:** Catha Nottingham, NP **CHN#:** [phone]    ------    ---       **Reason for Appointment**    ---      1\. HFrEF follow up, s/p hospitalization 1/28-06/13/2019    ---      **History of Present Illness**    ---     _GENERAL_ :    It was a pleasure to see [name] in the Heart Failure Clinic at [facility]. Arthur Young is a 54 year old male a history of HFrEF, HTN, DM,  COPD, and schizoaffective disorder (paranoid).    05/11/19-05/19/19: Arthur Young presented with fluid overload on exam (pitting  edema, crackles), concerning for a heart failure exacerbation. TTE showed LVEF  30%, LVSF severely reduced, RVSF moderately reduced, mild to moderate TR, RVSP  40-6mmHg. PCP records indicated the patient had EF ~ 30% for > 10 years  (details not currently available). Patient has reportedly been resistant to  medication changes in the past. BNP 224. He was diuresed and discharged on  Lasix 20mg  daily. Discharge weight 77.1 kg. In addition, there was felt to be  a component of COPD exacerbation and he was started on steroids, standing  duonebs, and empiric antibiotics for possible community-acquired pneumonia.    On 05/24/19, Arthur Young presented to the Heart Failure Clinic with his group  home attendant. He reported feeling much better. He stated SOB, cough, and had  resolved. He was not particularly activity at baseline, but did not appear to  have chest pain or dyspnea with activity. He did report that his current  medical conditions  were related to a curse. He reported adherence with  medications with the assistance with group home staff. He weighed himself  daily. He was not willing to check fingerstick glucoses.    06/07/18-06/13/19: Arthur Young was readmitted with 2 days of shortness of breath,  dry cough, O2 Sat in the 80s while at his group home, and bilateral lower  extremity edema. Initial labs notable for Cr 2.15 (from 1.3 on 1/13), K 6.2,  Na 131 (139 on 1/13), bicarb 26, troponin 0.05, BNP 827 (286 on 1/13), and WBC  9. He received IV NS 500cc, IV dexamethasone 6mg  x1, DuoNeb x2, Insulin  10units/D50, Kayexelate 30g x1. EKG had no peaked T waves or ischemic changes.  Given the patient's fluid overload on exam, labwork (i.e. BNP), and weight  gain (86.4kg), there was concern for HF exacerbation. He was diuresed, with a  discharge weight of 74.4kg. There was concern that he may not be taking his  diuretic, so his Lasix 40mg  daily was scheduled for noon, when it could be  monitored by group home staff. He agreed to weigh himself daily. AKI was  thought to be cardiorenal in the setting of his HF exacerbation as well as  progression of his renal  disease. Cr improved with diuresis as well as  Losartan being held for hyperkalemia. Cr on discharge 1.86. K normalized off  Losartan.    He was also felt to have COPD exacerbation and treated with Prednisone, 5 day  course. home inhaler regimen was continued on discharge. The patient's H/H was  16.6/54 on admission. This was thought to be secondary to his COPD although  full workup of primary polycythemia should be done as an outpatient (i.e. JAK2  testing, bone marrow cytology, erythropoietin level).    Today, 06/19/19, the patient returns to the HF Clinic. He complains of cough and  requested cough medicine (Tessalon provided). Otherwise he denies any  complaints, including chest pain, palpitations, SOB at rest, dyspnea on  exertion, orthopnea, PND, LE edema, or abdominal distention. He is  insistent  he does not want any medications today. He does not want any labs draws.     _Ambulatory Falls and Injury Prevention_ :    HPI Have you experienced a fall in the past year? No, Is the patient using  assistive devices such as a cane or walker? No, Do you need assistance with  ambulation while at our facility? No, Interventions none, patient not a fall  risk .     **Current Medications**    ---    Taking    * Acetaminophen 500 MG Tablet 1 tablet as needed Orally every 6 hrs    ---    * Albuterol Sulfate 108 (90 Base) MCG/ACT Aerosol Powder Breath Activated 2 puff as needed Inhalation every 4 hrs    ---    * Ammonium Lactate 5 % Lotion 1 application Externally Once a day as needed for itching    ---    * Arnuity Ellipta 200 MCG/ACT Aerosol Powder Breath Activated 1 puff Inhalation Once a day    ---    * Aspirin 81 MG Tablet Chewable 1 tablet Orally Once a day    ---    * Atorvastatin Calcium 20 MG Tablet 1 tablet Orally Once a day    ---    * Brimonidine Tartrate 0.15 % Solution 1 drop to both eyes Ophthalmic Twice a day    ---    * Carbamazepine 200 MG Tablet 3 tablets Orally Once a day at bedtime    ---    * Carbamazepine 200 mg Tablet 2 tablets Orally in the AM    ---    * Docusate Sodium 100 MG Capsule 1 capsule Orally Twice a day    ---    * Flovent HFA 110 MCG/ACT Aerosol 2 puffs Inhalation Twice a day    ---    * Furosemide 20 MG Tablet 2 tablets Orally Once a day    ---    * Incruse Ellipta 62.5 MCG/INH Aerosol Powder Breath Activated 1 puff Inhalation Once a day    ---    * Metformin HCl 1000 MG Tablet 1 tablet Orally Twice a day    ---    * Metoprolol Succinate ER 50 MG Tablet Extended Release 24 Hour 1.5 tablets (75 mg) Orally Once a day    ---    * Mirtazapine 15 MG Tablet 1 tablet at bedtime Orally Once a day    ---    * Olanzapine 10 MG Tablet 1 tablet Orally Once a day    ---    * Polyethylene Glycol 3350 17 GM Packet 1 as needed for constipation Orally Once a day    ---    *  Quetiapine  Fumarate 300 MG Tablet 1 tablet at bedtime Orally Once a day    ---    * Sennosides 8.6 MG Tablet 1 tablet at bedtime as needed Orally Once a day    ---    Not-Taking/PRN    * Losartan Potassium 50 MG Tablet 1 tablet Orally Once a day    ---    Unknown    * Dorzolamide HCl 2 % Solution 1 drop to both eyes Ophthalmic Twice a day    ---      **Past Medical History**    ---      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        ---    COPD.        ---    T2DM.        ---    HTN.        ---    HLD.        ---    Schizoaffective disorder.        ---    Insomnia.        ---    Cataracts.        ---      **Social History**    ---    Abuse/Neglect  Do you feel unsafe in your relationships? No, Have you ever  been hit, kicked, punched or otherwise hurt by someone in the past year? No.   No Etoh, ilicits. Current smoker, 1 ppd for "a lot of years". Resides in group  home.    ---      **Allergies**    ---      Depakote    ---    Lithium    ---      **Hospitalization/Major Diagnostic Procedure**    ---      ADHF, COPD 12/31-05/19/2019    ---    ADHF, COPD 1/28-06/13/2019    ---      **Review of Systems**    ---     _CardioVascular_ :    Neurologic no numbness, weakness, no difficulty speaking, or paresthesias;  cataracts and glaucoma. General/Constitutional no fever, chills, night sweats,  headaches, change in appetite / weight, fatigue. Gastrointestinal no blood in  stool, nausea, vomiting, diarrhea, or abdominal pain; + constipation.  Hematology no easy bruising, no prolonged bleeding. Peripheral Vascular no  claudication, no skin ulceration. Endocrine no polydipsia, polyphagia, hot and  cold intolerance. Skin no rash. ENT no nosebleeds. Cardiovascular per HPI.  Respiratory no wheezing, shortness of breath at rest, sore throat, upper  airway congestion, or hemoptysis; + cough. Genitourinary no blood in urine.  Musculoskeletal no arthraglias, myalgias, or muscle aches.         **Vital Signs**    ---    Pain scale 0, Ht-in  62, Wt-lbs 176, BMI 32.19, BP 102/80, HR 108, BSA 1.87, O2  90, Ht-cm 157.48, Wt-kg 79.83, Wt Change -2.8 lb.      **Examination**    ---     _Cardiac Testing:_    Echo: 05/15/19: EF 30%. LV mildly dilated, LVSF severely reduced. The tissue  Doppler diastolic parameters are indeterminate. Severe global hypokinesis of  the left ventricle. Flattened septum is consistent with RV pressure overload.  RV mild to moderately dilated. RVSF moderately reduced. LA normal. RA  moderately dilated. The inter-atrial septum bows toward the left atrium  consistent with high right atrial pressure. A dilated inferior vena cava  suggests increased right atrial pressure. The mitral valve  leaflets appear  mildly thickened. There is mild mitral annular calcification. Trace MR. Mild  to moderate TR. RVSP 40-36mmHg. Trace PVR.         **Physical Examination**    ---     _General Examination_ :    General Appearance well developed, well nourished, gentleman no acute  distress. Head normocephalic. Eyes sclera non-icteric. Neck/Thyroid no JVD  (though somewhat difficult exam due to body habitus), no carotid bruit. Skin  no rashes. Heart regular rate and rhythm, normal S1, normal S2. Lungs few  upper airway wheezes, improve after cough; no crackles or rhonchi. Abdomen  large, soft, non tender, with bowel sounds present, no organomegaly  appreciated. Extremities no edema, clubbing, or cyanosis. Peripheral Pulses  2  + radial, 2 + dorsalis pedis, normal symmetric pulses bilaterally.  Neurological nonfocal, no gross focal deficits.         **Assessments**    ---    1\. Chronic HFrEF (heart failure with reduced ejection fraction) - I50.22  (Primary)    ---    2\. Essential hypertension - I10    ---    3\. Type 2 diabetes mellitus without complication, without long-term current  use of insulin - E11.9    ---    4\. Stage 2 chronic kidney disease - N18.2    ---    5\. Hyperkalemia - E87.5    ---     [name] is a 54 year old male a history  of HFrEF, HTN, DM, COPD, CKD  and schizoaffective disorder (paranoid). He was recently readmitted with  AECOPD and ADHF.    TTE (05/15/19): EF 30%. LV mildly dilated, RV mild to moderately dilated with  moderately reduced systolic function. RA moderately dilated. Mild to moderate  TR. RVSP 40-18mmHg.    # HFrEF. NYHA II, appears euvolemic.    Current regime: Metoprolol 75mg  daily, Lasix 40mg  daily.    Will hold off spironolactone given hyperK. Losartan remains on hold as well,  due to hyperK.    HR still not at goal, but patient refused any changes today. If patient  allows, continue to uptitrate beta blocker as BP allows, to goal HF ~ 70. May  need ivabradine.    Does not have ICD. If patient agrees, would consider as well.    # HTN.    Soft BP today, but asymptomatic.    Follow.    # HLD.    Atorvastatin 20mg  daily.    # HyperKalemia.    Mild, but limiting med options.    May Veltassa or similar.    # [id number].    Baseline Cr ~ 1.3-1.4. Cr up during hospitalization, peak 2.34 (1/29), 1.86  (at discharge, 2/2). Unfortunately patient declined today.    # T2DM.    A1c. 7.7%. Goal 6.5-7%    Metformin increased to 1000mg  BID during hospitalization, though given renal  function, need to need. GFR 40.    Thank you for the opportunity to assist in the care of Arthur Young. Will  transition care back to [facility] and prior cardiology provider. Please check  chem-7 as patient allows.    ---      **Treatment**    ---      **1\. Chronic HFrEF (heart failure with reduced ejection fraction)**    Continue Metoprolol Succinate ER Tablet Extended Release 24 Hour, 50 MG, 1.5  tablets (75 mg), Orally, Once a day    Continue Furosemide Tablet, 20 MG, 2 tablets, Orally, Once a day    ---         **  2\. Type 2 diabetes mellitus without complication, without long-term  current use of insulin**    Continue Metformin HCl Tablet, 1000 MG, 1 tablet, Orally, Twice a day         **3\. Others**    Start Tessalon Perles Capsule, 100 MG, 1 capsule as  needed, Orally, Three  times a day, 30 days, 90 Capsule, Refills 0    **Appointment Provider:** Catha Nottingham, NP    Electronically signed by Catha Nottingham , NP on 06/23/2019 at 12:53 PM EST    Sign off status: Completed        * * *        [facility]    [address]    [place], [address]    [place], [place] [address]    Tel: [phone]    Fax: [phone]              * * *          Progress Note: Catha Nottingham, NP 06/19/2019    ---    Note generated by eClinicalWorks EMR/PM Software ([email or web address])

## 2019-06-19 NOTE — Progress Notes (Signed)
 .  Progress Notes  .  Patient: Arthur Young  Provider: Catha Nottingham    .  DOB: 12-03-1965 Age: 54 Y Sex: Male  .  PCP: Berenice Primas  NP  Date: 06/19/2019  .  --------------------------------------------------------------------------------  .  REASON FOR APPOINTMENT  .  1. HFrEF follow up, s/p hospitalization 1/28-06/13/2019  .  HISTORY OF PRESENT ILLNESS  .  GENERAL:  It was a pleasure to see Mr. Lorn Butcher  in the Heart Failure Clinic at [facility]. Mr. Bozman  is a 54 year old male a history of HFrEF, HTN, DM, COPD, and  schizoaffective disorder (paranoid). 05/11/19-05/19/19: Mr. Hynes  presented with fluid overload on exam (pitting edema, crackles),  concerning for a heart failure exacerbation. TTE showed LVEF 30%,  LVSF severely reduced, RVSF moderately reduced, mild to moderate  TR, RVSP 40-50mmHg. PCP records indicated the patient had EF   .  30% for > 10 years (details not currently available). Patient has  reportedly been resistant to medication changes in the past. BNP  224. He was diuresed and discharged on Lasix 20mg  daily.  Discharge weight 77.1 kg. In addition, there was felt to be a  component of COPD exacerbation and he was started on steroids,  standing duonebs, and empiric antibiotics for possible  community-acquired pneumonia. On 05/24/19, Mr. Bills presented to  the Heart Failure Clinic with his group home attendant. He  reported feeling much better. He stated SOB, cough, and had  resolved. He was not particularly activity at baseline, but did  not appear to have chest pain or dyspnea with activity. He did  report that his current medical conditions were related to a  curse. He reported adherence with medications with the assistance  with group home staff. He weighed himself daily. He was not  willing to check fingerstick glucoses.06/07/18-06/13/19: Mr. Franca  was readmitted with 2 days of shortness of breath, dry cough, O2  Sat in the 80s while at his group home, and bilateral  lower  extremity edema. Initial labs notable for Cr 2.15 (from 1.3 on  1/13), K 6.2, Na 131 (139 on 1/13), bicarb 26, troponin 0.05, BNP  827 (286 on 1/13), and WBC 9. He received IV NS 500cc, IV  dexamethasone 6mg  x1, DuoNeb x2, Insulin 10units/D50, Kayexelate  30g x1. EKG had no peaked T waves or ischemic changes. Given the  patient's fluid overload on exam, labwork (i.e. BNP), and weight  gain (86.4kg), there was concern for HF exacerbation. He was  diuresed, with a discharge weight of 74.4kg. There was concern  that he may not be taking his diuretic, so his Lasix 40mg  daily  was scheduled for noon, when it could be monitored by group home  staff. He agreed to weigh himself daily. AKI was thought to be  cardiorenal in the setting of his HF exacerbation as well as  progression of his renal disease. Cr improved with diuresis as  well as Losartan being held for hyperkalemia. Cr on discharge  1.86. K normalized off Losartan.He was also felt to have COPD  exacerbation and treated with Prednisone, 5 day course. home  inhaler regimen was continued on discharge. The patient's H/H was  16.6/54 on admission. This was thought to be secondary to his  COPD although full workup of primary polycythemia should be done  as an outpatient (i.e. JAK2 testing, bone marrow cytology,  erythropoietin level).Today, 06/19/19, the patient returns to the  HF Clinic. He complains of cough and requested cough medicine  (  Tessalon provided). Otherwise he denies any complaints,  including chest pain, palpitations, SOB at rest, dyspnea on  exertion, orthopnea, PND, LE edema, or abdominal distention. He  is insistent he does not want any medications today. He does not  want any labs draws.  .  Ambulatory Falls and Injury Prevention:  HPI  .  Have you experienced a fall in the past year?No , Is the patient  using assistive devices such as a cane or walker?No , Do you need  assistance with ambulation while at our facility?No ,  Interventionsnone,  patient not a fall risk  .  [name]  CURRENT MEDICATIONS  .  Taking Acetaminophen 500 MG Tablet 1 tablet as needed Orally  every 6 hrs  Taking Albuterol Sulfate 108 (90 Base) MCG/ACT Aerosol Powder  Breath Activated 2 puff as needed Inhalation every 4 hrs  Taking Ammonium Lactate 5 % Lotion 1 application Externally Once  a day as needed for itching  Taking Arnuity Ellipta 200 MCG/ACT Aerosol Powder Breath  Activated 1 puff Inhalation Once a day  Taking Aspirin 81 MG Tablet Chewable 1 tablet Orally Once a day  Taking Atorvastatin Calcium 20 MG Tablet 1 tablet Orally Once a  day  Taking Brimonidine Tartrate 0.15 % Solution 1 drop to both eyes  Ophthalmic Twice a day  Taking Carbamazepine 200 MG Tablet 3 tablets Orally Once a day at  bedtime  Taking Carbamazepine 200 mg Tablet 2 tablets Orally in the AM  Taking Docusate Sodium 100 MG Capsule 1 capsule Orally Twice a  day  Taking Flovent HFA 110 MCG/ACT Aerosol 2 puffs Inhalation Twice a  day  Taking Furosemide 20 MG Tablet 2 tablets Orally Once a day  Taking Incruse Ellipta 62.5 MCG/INH Aerosol Powder Breath  Activated 1 puff Inhalation Once a day  Taking Metformin HCl 1000 MG Tablet 1 tablet Orally Twice a day  Taking Metoprolol Succinate ER 50 MG Tablet Extended Release 24  Hour 1.5 tablets (75 mg) Orally Once a day  Taking Mirtazapine 15 MG Tablet 1 tablet at bedtime Orally Once a  day  Taking Olanzapine 10 MG Tablet 1 tablet Orally Once a day  Taking Polyethylene Glycol 3350 17 GM Packet 1 as needed for  constipation Orally Once a day  Taking Quetiapine Fumarate 300 MG Tablet 1 tablet at bedtime  Orally Once a day  Taking Sennosides 8.6 MG Tablet 1 tablet at bedtime as needed  Orally Once a day  Not-Taking/PRN Losartan Potassium 50 MG Tablet 1 tablet Orally  Once a day  Unknown Dorzolamide HCl 2 % Solution 1 drop to both eyes  Ophthalmic Twice a day  .  PAST MEDICAL HISTORY  .  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  ALLERGIES  .  Depakote  Lithium  .  SOCIAL HISTORY  .  .  Abuse/NeglectDo you feel unsafe in your relationships?No , Have  you ever been hit, kicked, punched or otherwise hurt by someone  in the past year? No.  .  No Etoh, ilicits. Current smoker, 1 ppd for "a lot of years".  Resides in group home.  [name]  HOSPITALIZATION/MAJOR DIAGNOSTIC PROCEDURE  .  ADHF, COPD 12/31-05/19/2019  ADHF, COPD 1/28-06/13/2019  .  REVIEW OF SYSTEMS  .  CardioVascular:  .  Neurologic    no numbness, weakness, no difficulty speaking, or  paresthesias; cataracts and glaucoma . General/Constitutional  no fever, chills, night sweats, headaches, change in appetite /  weight, fatigue . Gastrointestinal    no blood in stool, nausea,  vomiting, diarrhea, or abdominal pain; + constipation .  Hematology    no easy bruising, no prolonged bleeding .  Peripheral Vascular    no claudication, no skin ulceration .  Endocrine    no polydipsia, polyphagia, hot and cold intolerance  . Skin    no rash . ENT    no nosebleeds . Cardiovascular    per  HPI . Respiratory    no wheezing, shortness of breath at rest,  sore throat, upper airway congestion, or hemoptysis; + cough .  Genitourinary    no blood in urine . Musculoskeletal    no  arthraglias, myalgias, or muscle aches .  [name]  VITAL SIGNS  .  Pain scale 0, Ht-in 62, Wt-lbs 176, BMI 32.19, BP 102/80, HR 108,  BSA 1.87, O2 90, Ht-cm 157.48, Wt-kg 79.83, Wt Change -2.8 lb.  [name]  EXAMINATION  .  Cardiac Testing:  Echo:05/15/19: EF 30%. LV mildly dilated, LVSF severely reduced.  The tissue Doppler diastolic parameters are indeterminate. Severe  global hypokinesis of the left ventricle. Flattened septum is  consistent with RV pressure overload. RV mild to moderately  dilated. RVSF moderately reduced. LA normal. RA moderately  dilated. The inter-atrial septum bows toward the left atrium  consistent with high right atrial pressure. A dilated inferior  vena cava suggests increased right  atrial pressure. The mitral  valve leaflets appear mildly thickened. There is mild mitral  annular calcification. Trace MR. Mild to moderate TR. RVSP  40-33mmHg. Trace PVR.  [name]  PHYSICAL EXAMINATION  .  General Examination:  General Appearance  well developed, well nourished, gentleman no  acute distress. Head  normocephalic. Eyes  sclera non-icteric.  Neck/Thyroid  no JVD (though somewhat difficult exam due to body  habitus), no carotid bruit. Skin  no rashes. Heart  regular rate  and rhythm, normal S1, normal S2. Lungs  few upper airway  wheezes, improve after cough; no crackles or rhonchi. Abdomen   large, soft, non tender, with bowel sounds present, no  organomegaly appreciated. Extremities  no edema, clubbing, or  cyanosis. Peripheral Pulses  2 + radial, 2 + dorsalis pedis,  normal symmetric pulses bilaterally. Neurological  nonfocal, no  gross focal deficits.  .  ASSESSMENTS  .  Chronic HFrEF (heart failure with reduced ejection fraction) -  I50.22 (Primary)  .  Essential hypertension - I10  .  Type 2 diabetes mellitus without complication, without long-term  current use of insulin - E11.9  .  Stage 2 chronic kidney disease - N18.2  .  Hyperkalemia - E87.5  .  Mr. Zelig Gacek is a 54 year old male a history of HFrEF, HTN,  DM, COPD, CKD and schizoaffective disorder (paranoid). He was  recently readmitted with AECOPD and ADHF.TTE (05/15/19): EF 30%. LV  mildly dilated, RV mild to moderately dilated with moderately  reduced systolic function. RA moderately dilated. Mild to  moderate TR. RVSP 40-9mmHg. # HFrEF. NYHA II, appears  euvolemic.Current regime: Metoprolol 75mg  daily, Lasix 40mg   daily.Will hold off spironolactone given hyperK. Losartan remains  on hold as well, due to hyperK.HR still not at goal, but patient  refused any changes today. If patient allows, continue to  uptitrate beta blocker as BP allows, to goal HF    70. May need  ivabradine.Does not have ICD. If patient agrees, would consider  as well.#  HTN.Soft BP today, but asymptomatic.Follow.#  HLD.Atorvastatin 20mg  daily.# HyperKalemia.Mild, but limiting med  options.May Veltassa or similar.# [id number].Baseline Cr    1.3-1.4. Cr  up during hospitalization, peak 2.34 (1/29), 1.86 (at discharge,  2/2). Unfortunately patient declined today.# T2DM.A1c. 7.7%. Goal  6.5-7%Metformin increased to 1000mg  BID during hospitalization,  though given renal function, need to need. GFR 40.Thank you for  the opportunity to assist in the care of Mr. Busbee. Will  transition care back to [facility] and prior cardiology provider.  Please check chem-7 as patient allows.  .  TREATMENT  .  Chronic HFrEF (heart failure with reduced ejection  fraction)  Continue Metoprolol Succinate ER Tablet Extended Release 24 Hour,  50 MG, 1.5 tablets (75 mg), Orally, Once a day  Continue Furosemide Tablet, 20 MG, 2 tablets, Orally, Once a day  .  [name]  Type 2 diabetes mellitus without complication,  without long-term current use of insulin  Continue Metformin HCl Tablet, 1000 MG, 1 tablet, Orally, Twice a  day  .  [name]  Others  Start Tessalon Perles Capsule, 100 MG, 1 capsule as needed,  Orally, Three times a day, 30 days, 90 Capsule, Refills 0  .  [name]  Appointment Provider: Catha Nottingham, NP  .  Electronically signed by Catha Nottingham , NP on  06/23/2019 at 12:53 PM EST  .  Document electronically signed by Catha Nottingham    .

## 2019-07-12 ENCOUNTER — Ambulatory Visit

## 2019-07-21 ENCOUNTER — Ambulatory Visit: Admit: 2019-07-21 | Payer: Medicare Other

## 2019-07-21 ENCOUNTER — Ambulatory Visit: Admitting: Internal Medicine

## 2019-07-21 ENCOUNTER — Ambulatory Visit

## 2019-07-21 MED ORDER — Trelegy Ellipta: inhaler | Freq: Every day | 11 refills | 0 days

## 2019-07-21 NOTE — Progress Notes (Signed)
.  Progress Notes  .  Patient: Arthur Young  Provider: Sharyn Lull  MD  .  DOB: 01-09-1966 Age: 54 Y Sex: Male  .  PCP: Berenice Primas  NP  Date: 07/21/2019  .  --------------------------------------------------------------------------------  .  HISTORY OF PRESENT ILLNESS  .  GENERAL:   Arthur Young is a 54 year old male a history of HFrEF, HTN, DM,  COPD, and schizoaffective disorder (paranoid) presenting to  establish care for COPD. Arthur Young is aware of his diagnosis of  COPD and reports it is due to 'a curse and breathing poisonous  fumes'. He perservserated on people using chemical weapons  causing his COPD throughout his appt. He reports dyspnea on  exertion, being able to walk about 1 block before stopping to  catch his breath. No SoB with ADL's. He reports a cough  productive of scant white sputum, no hemoptysis.He has had  several admissions for acute decomensated HF in the past year (he  cannot recall how many, only recently established care at [facility]),  and was recently admitted in 05/2019 for shortness of breath,  thought to be due to COPD and CHF exacerbation. He also had an  AKI on CKD which was felt to be cardiorenal. He was diuresed,  treated with 5 days of PO prednisone and discharged home. Inhaler  regimen: Arnuity, umeclidinium- not using daily, only prn when he  has SoB. 'Couple of days a week'Symptoms: wheezing, cough,  SOBRescue inhaler use frequency: 3-4 x weekCOPD exacerbation: one  this year 1/21Hospitalizations: One this year, unclear how many  he has had at other hospitals. Intubations: NonePrednisone  course: Once this year, cannot recall if he received this last  year. Has been smoking since the age of 27y, and continues to  smoke 1/2PPD (20 pack year smoking history). CXR: Left lower lobe  infiltrates, Stable cardiomegaly and right lateral chest wall  pleural thickening associated with multiple old rib fractures.  PFTs: very severe obstruction with BD responsivenessTTE: EF 30%,  The  tissue Doppler diastolic parameters are indeterminate. There  is severe global hypokinesis of the left ventricle. Flattened  septum is consistent with RV pressure overload, Right ventricle  is mild to moderately dilated with moderately reduced function.  TAPSE is 1.0cm. TDI S' is 7.7cm/s. The right atrium is moderately  dilated. The inter-atrial septum bows toward the leftatrium  consistent with high right atrial pressure. A dilated inferior  vena cava suggests increased right atrial pressure. The lack of  respiratory variation in the inferior vena cava diameter is  noted.  .  CURRENT MEDICATIONS  .  Taking Acetaminophen 500 MG Tablet 1 tablet as needed Orally  every 6 hrs  Taking Albuterol Sulfate 108 (90 Base) MCG/ACT Aerosol Powder  Breath Activated 2 puff as needed Inhalation every 4 hrs  Taking Ammonium Lactate 5 % Lotion 1 application Externally Once  a day as needed for itching  Taking Arnuity Ellipta 200 MCG/ACT Aerosol Powder Breath  Activated 1 puff Inhalation Once a day  Taking Aspirin 81 MG Tablet Chewable 1 tablet Orally Once a day  Taking Atorvastatin Calcium 20 MG Tablet 1 tablet Orally Once a  day  Taking Brimonidine Tartrate 0.15 % Solution 1 drop to both eyes  Ophthalmic Twice a day  Taking Carbamazepine 200 MG Tablet 3 tablets Orally Once a day at  bedtime  Taking Carbamazepine 200 mg Tablet 2 tablets Orally in the AM  Taking Docusate Sodium 100 MG Capsule 1 capsule Orally Twice a  day  Taking Flovent HFA 110 MCG/ACT Aerosol 2 puffs Inhalation Twice a  day  Taking Furosemide 20 MG Tablet 2 tablets Orally Once a day  Taking Incruse Ellipta 62.5 MCG/INH Aerosol Powder Breath  Activated 1 puff Inhalation Once a day  Taking Metformin HCl 1000 MG Tablet 1 tablet Orally Twice a day  Taking Metoprolol Succinate ER 50 MG Tablet Extended Release 24  Hour 1.5 tablets (75 mg) Orally Once a day  Taking Mirtazapine 15 MG Tablet 1 tablet at bedtime Orally Once a  day  Taking Olanzapine 10 MG Tablet 1 tablet  Orally Once a day  Taking Polyethylene Glycol 3350 17 GM Packet 1 as needed for  constipation Orally Once a day  Taking Quetiapine Fumarate 300 MG Tablet 1 tablet at bedtime  Orally Once a day  Taking Sennosides 8.6 MG Tablet 1 tablet at bedtime as needed  Orally Once a day  Taking Tessalon Perles 100 MG Capsule 1 capsule as needed Orally  Three times a day  Not-Taking/PRN Losartan Potassium 50 MG Tablet 1 tablet Orally  Once a day  Unknown Dorzolamide HCl 2 % Solution 1 drop to both eyes  Ophthalmic Twice a day  Medication List reviewed and reconciled with the patient  .  PAST MEDICAL HISTORY  .  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  ALLERGIES  .  Depakote  Lithium  .  SURGICAL HISTORY  .  No Surgical History documented.  [name]  FAMILY HISTORY  .  No family history of heart or lung disease.  .  SOCIAL HISTORY  .  .  Abuse/Neglect  Do you feel unsafe in your relationships?No  Have you ever been hit, kicked, punched or otherwise hurt by  someone in the past year? No  .  Smokes 1/2 PPDNo alcohol No drug use Lives in a group home From  [place], moved to [place] about 30y back.  .  HOSPITALIZATION/MAJOR DIAGNOSTIC PROCEDURE  .  ADHF, COPD 12/31-05/19/2019  ADHF, COPD 1/28-06/13/2019  .  REVIEW OF SYSTEMS  .  ADULT Pulmonary:  .  Constitutional    no fever, chills, night sweats . Eyes    no  vision changes . HENT    no nasal congestion, runny nose .  Respiratory    as per HPI . Cardiovascular    no chest pain,  palpitations .  [name]  VITAL SIGNS  .  Pain scale 0, Ht-in 62, Wt-lbs 175.1, BMI 32.02, BP 119/83, HR  100, RR 20, Temp 98.4, Oxygen sat % 88RA.  [name]  PHYSICAL EXAMINATION  .  Pulmonary:  General Appearance:  Normal.  Nasal Mucosa  moist, pink, without exudate, normal turbinates,  Normal.  Oropharynx  Normal.  Neck  supple, no JVD.  Heart Exam  regular rate and rhythm, normal S1/S2 progression.  Extremities  Normal, No edema.  .  ASSESSMENTS  .  Severe chronic  obstructive pulmonary disease - J44.9 (Primary)  .  Pleural thickening - J92.9  .  Pulmonary hypertension - I27.20  .  Cigarette nicotine dependence without complication - F17.210  .  Mr. Nylund is a 54 year old male a history of HFrEF, HTN, DM,  COPD, and schizoaffective disorder (paranoid) presenting to  establish care for severe COPD with exacerbations. Mr. Lippman has  very severe COPD with exacerbations GOLD D (stage 4) leading to  at least 2 admissions in the last 4 months. He is  not adherent to  his inhaler regimen and has been using his daily inhalers on an  'as needed' basis 3x week which is a contributor poor disease  control. His inhaler regimen is also suboptimal for the severity  of his COPD (on ICS/LABA) and he continues to smoke 1/2PPD. He is  not interested in quitting at present. -Start LAMA in addition to  ICS/LABA- I have prescribed trelegy as it is a single inhaler  (ICS/LAMA/LABA) and may improve adherence in addition to stepping  up therapy -Counselled re inhaler adherence -Continue albuterol  prn-O2 sat at rest 89-92% likely drops to <88 with minimal  activity. Given suggestion of PH on TTE (likely group 2,3) he  would benefit from supplemental O2, however, given his continued  smoking and limited insight into disease, the risks of O2 would  outweigh benefits. He has also declined wanting to use O2 even if  his sats were low, making a unlikely to change management.  -Would benefit from pulm rehab- will discuss at next appt-once  adherent to inhaler regimen, roflumilast may be an option for him  as well given frequent exacerbations. -Smoking cessation  counselling: not interested in quitting, counselled re harmful  effects of smoking. -CT chest below will allow [place] to determine if  he emphysema, and if so what the distribution is (evaluation for  LVRS in the future perhaps).#Rt PLeural thickening: seen on CXR,  obtain CT chest to better evaluate, ?mesothelioma. #Smoking:  Counselled re  smoking cessation, not interested in  quitting#Elevated RVSP: NOted on TTE, PH likely combination of  grp 2,3 given HFrEF. TTE performed during a HFpEF and COPD  exacerbation, which may lead to higher PA pressures and could  overestimate RVSP. -Management of HFrEF per cardiology and COPD  as above recommended. -Refused supplemental O2 (and poor  candidate).RTC in 6-8 weeks.  .  TREATMENT  .  Severe chronic obstructive pulmonary disease  Start Trelegy Ellipta Aerosol Powder Breath Activated,  200-62.5-25 MCG/INH, 1 puff, Inhalation, Once a day, 30 days, 1  inhaler, Refills 11  CT [id number]  .  FOLLOW UP  .  6-8 weeks  .  [name]  Appointment Provider: Sharyn Lull  .  Electronically signed by Sharyn Lull MD on  07/25/2019 at 04:40 PM EDT  .  Document electronically signed by Sharyn Lull  MD  .

## 2019-07-21 NOTE — Progress Notes (Signed)
 * * Arthur Young **DOB:** [birth year] (53 yo M) **Acc No.** [id number] **DOS:**  07/21/2019    ---       Arthur Young**    ------    4 Y old Male, DOB: August 12, 1965, External MRN: [id number]    Account Number: [id number]    [address] [id number]    Home: [phone]    Insurance: M80 CMNWLTH CARE/ONE CARE    PCP: Berenice Primas, NP Referring: Berenice Primas, NP    Appointment Facility: Pulmonology        * * *    07/21/2019  **Appointment Provider:** Sharyn Lull **CHN#:** [id number]    ------    ---       **Current Medications**    ---    Taking    * Acetaminophen 500 MG Tablet 1 tablet as needed Orally every 6 hrs    ---    * Albuterol Sulfate 108 (90 Base) MCG/ACT Aerosol Powder Breath Activated 2 puff as needed Inhalation every 4 hrs    ---    * Ammonium Lactate 5 % Lotion 1 application Externally Once a day as needed for itching    ---    * Arnuity Ellipta 200 MCG/ACT Aerosol Powder Breath Activated 1 puff Inhalation Once a day    ---    * Aspirin 81 MG Tablet Chewable 1 tablet Orally Once a day    ---    * Atorvastatin Calcium 20 MG Tablet 1 tablet Orally Once a day    ---    * Brimonidine Tartrate 0.15 % Solution 1 drop to both eyes Ophthalmic Twice a day    ---    * Carbamazepine 200 MG Tablet 3 tablets Orally Once a day at bedtime    ---    * Carbamazepine 200 mg Tablet 2 tablets Orally in the AM    ---    * Docusate Sodium 100 MG Capsule 1 capsule Orally Twice a day    ---    * Flovent HFA 110 MCG/ACT Aerosol 2 puffs Inhalation Twice a day    ---    * Furosemide 20 MG Tablet 2 tablets Orally Once a day    ---    * Incruse Ellipta 62.5 MCG/INH Aerosol Powder Breath Activated 1 puff Inhalation Once a day    ---    * Metformin HCl 1000 MG Tablet 1 tablet Orally Twice a day    ---    * Metoprolol Succinate ER 50 MG Tablet Extended Release 24 Hour 1.5 tablets (75 mg) Orally Once a day    ---    * Mirtazapine 15 MG Tablet 1 tablet at bedtime Orally Once a day    ---    * Olanzapine 10 MG  Tablet 1 tablet Orally Once a day    ---    * Polyethylene Glycol 3350 17 GM Packet 1 as needed for constipation Orally Once a day    ---    * Quetiapine Fumarate 300 MG Tablet 1 tablet at bedtime Orally Once a day    ---    * Sennosides 8.6 MG Tablet 1 tablet at bedtime as needed Orally Once a day    ---    * Tessalon Perles 100 MG Capsule 1 capsule as needed Orally Three times a day    ---    Not-Taking/PRN    * Losartan Potassium 50 MG Tablet 1 tablet Orally Once a day    ---  Unknown    * Dorzolamide HCl 2 % Solution 1 drop to both eyes Ophthalmic Twice a day    ---    * Medication List reviewed and reconciled with the patient    ---      **Past Medical History**    ---      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        ---    COPD.        ---    T2DM.        ---    HTN.        ---    HLD.        ---    Schizoaffective disorder.        ---    Insomnia.        ---    Cataracts.        ---      **Surgical History**    ---      No Surgical History documented.    ---      **Family History**    ---      No family history of heart or lung disease.    ---      **Social History**    ---    Abuse/Neglect    Do you feel unsafe in your relationships? _No_    Have you ever been hit, kicked, punched or otherwise hurt by someone in the  past year? _No_  Smokes 1/2 PPD    No alcohol    No drug use    Lives in a group home    From [place], moved to [place] about 30y back.    ---      **Allergies**    ---      Depakote    ---    Lithium    ---      **Hospitalization/Major Diagnostic Procedure**    ---      ADHF, COPD 12/31-05/19/2019    ---    ADHF, COPD 1/28-06/13/2019    ---      **Review of Systems**    ---     _ADULT Pulmonary_ :    Constitutional no fever, chills, night sweats. Eyes no vision changes. HENT no  nasal congestion, runny nose. Respiratory as per HPI. Cardiovascular no chest  pain, palpitations.          **Assessments**    ---    1\. Severe chronic obstructive pulmonary disease - J44.9 (Primary)    ---     2\. Pleural thickening - J92.9    ---    3\. Pulmonary hypertension - I27.20    ---    4\. Cigarette nicotine dependence without complication - F17.210    ---     Arthur Young is a 54 year old male a history of HFrEF, HTN, DM, COPD, and  schizoaffective disorder (paranoid) presenting to establish care for severe  COPD with exacerbations.    Arthur Young has very severe COPD with exacerbations GOLD D (stage 4) leading to  at least 2 admissions in the last 4 months. He is not adherent to his inhaler  regimen and has been using his daily inhalers on an 'as needed' basis 3x week  which is a contributor poor disease control. His inhaler regimen is also  suboptimal for the severity of his COPD (on ICS/LABA) and he continues to  smoke 1/2PPD. He is not interested in quitting at present.    -Start LAMA in addition to  ICS/LABA- I have prescribed trelegy as it is a single inhaler (ICS/LAMA/LABA) and may improve adherence in addition to stepping up therapy     -Counselled re inhaler adherence     -Continue albuterol prn    -O2 sat at rest 89-92% likely drops to <88 with minimal activity. Given suggestion of PH on TTE (likely group 2,3) he would benefit from supplemental O2, however, given his continued smoking and limited insight into disease, the risks of O2 would outweigh benefits. He has also declined wanting to use O2 even if his sats were low, making a unlikely to change management.     -Would benefit from pulm rehab- will discuss at next appt    -once adherent to inhaler regimen, roflumilast may be an option for him as well given frequent exacerbations.     -Smoking cessation counselling: not interested in quitting, counselled re harmful effects of smoking.     -CT chest below will allow [place] to determine if he emphysema, and if so what the distribution is (evaluation for LVRS in the future perhaps).    #Rt PLeural thickening: seen on CXR, obtain CT chest to better evaluate,  ?mesothelioma.    #Smoking: Counselled re  smoking cessation, not interested in quitting    #Elevated RVSP: NOted on TTE, PH likely combination of grp 2,3 given HFrEF.  TTE performed during a HFpEF and COPD exacerbation, which may lead to higher  PA pressures and could overestimate RVSP.    -Management of HFrEF per cardiology and COPD as above recommended.     -Refused supplemental O2 (and poor candidate).    RTC in 6-8 weeks.    ---      **Treatment**    ---      **1\. Severe chronic obstructive pulmonary disease**    Start Trelegy Ellipta Aerosol Powder Breath Activated, 200-62.5-25 MCG/INH, 1  puff, Inhalation, Once a day, 30 days, 1 inhaler, Refills 11    _IMAGING: CT Chest_    ---      **Follow Up**    ---    6-8 weeks      **History of Present Illness**    ---     _GENERAL_ :    Arthur Young is a 54 year old male a history of HFrEF, HTN, DM, COPD, and  schizoaffective disorder (paranoid) presenting to establish care for COPD.    Arthur Young is aware of his diagnosis of COPD and reports it is due to 'a curse  and breathing poisonous fumes'. He perservserated on people using chemical  weapons causing his COPD throughout his appt. He reports dyspnea on exertion,  being able to walk about 1 block before stopping to catch his breath. No SoB  with ADL's. He reports a cough productive of scant white sputum, no  hemoptysis.    He has had several admissions for acute decomensated HF in the past year (he  cannot recall how many, only recently established care at [facility]), and was  recently admitted in 05/2019 for shortness of breath, thought to be due to COPD  and CHF exacerbation. He also had an AKI on CKD which was felt to be  cardiorenal. He was diuresed, treated with 5 days of PO prednisone and  discharged home.    Inhaler regimen: Arnuity, umeclidinium- not using daily, only prn when he has  SoB. 'Couple of days a week'    Symptoms: wheezing, cough, SOB    Rescue inhaler use frequency: 3-4 x week    COPD exacerbation:  one this year 1/21    Hospitalizations:  One this year, unclear how many he has had at other  hospitals.    Intubations: None    Prednisone course: Once this year, cannot recall if he received this last  year.    Has been smoking since the age of 67y, and continues to smoke 1/2PPD (20 pack  year smoking history).    CXR: Left lower lobe infiltrates, Stable cardiomegaly and right lateral chest  wall pleural thickening associated with multiple old rib fractures.    PFTs: very severe obstruction with BD responsiveness    TTE: EF 30%, The tissue Doppler diastolic parameters are indeterminate. There  is severe global hypokinesis of the left ventricle. Flattened septum is  consistent with RV pressure overload, Right ventricle is mild to moderately  dilated with moderately reduced function. TAPSE is 1.0cm. TDI S' is 7.7cm/s.  The right atrium is moderately dilated. The inter-atrial septum bows toward  the left    atrium consistent with high right atrial pressure. A dilated inferior vena  cava suggests increased right atrial pressure. The lack of respiratory  variation in the inferior vena cava diameter is noted.      **Vital Signs**    ---    Pain scale 0, Ht-in 62, Wt-lbs 175.1, BMI 32.02, BP 119/83, HR 100, RR 20,  Temp 98.4, Oxygen sat % 88RA.      **Physical Examination**    ---     _Pulmonary_ :    General Appearance: Normal.    Nasal Mucosa moist, pink, without exudate, normal turbinates, Normal.    Oropharynx Normal.    Neck supple, no JVD.    Heart Exam regular rate and rhythm, normal S1/S2 progression.    Extremities Normal, No edema.        **Appointment Provider:** Sharyn Lull    Electronically signed by Sharyn Lull MD on 07/25/2019 at 04:40 PM EDT    Sign off status: Completed        * * *        Pulmonology    [address]    [place], [address]    [place], [place] [address]    Tel: [phone]    Fax: [phone]              * * *          Progress Note: Sharyn Lull 07/21/2019    ---    Note generated by eClinicalWorks EMR/PM  Software ([email or web address])

## 2019-07-25 LAB — HX ASTHMA - PFTS

## 2019-07-31 ENCOUNTER — Ambulatory Visit: Payer: Managed Care, Other (non HMO)

## 2019-07-31 ENCOUNTER — Ambulatory Visit: Payer: Managed Care, Other (non HMO) | Attending: Internal Medicine

## 2019-07-31 DIAGNOSIS — Z23 Encounter for immunization: Secondary | ICD-10-CM

## 2019-07-31 NOTE — Progress Notes (Signed)
   Covid-19 Vaccination Clinic  Name:  Desiderio Dolata    MRN: [id number] DOB: [birth year]  07/31/2019  Mr. Pacer was observed post Covid-19 immunization for 15 minutes without incident. He was provided with Vaccine Information Sheet and instruction to access the V-Safe system.   Mr. Faria was instructed to call 911 with any severe reactions post vaccine: [name] Difficulty breathing  . Swelling of face and throat  . A fast heartbeat  . A bad rash all over body  . Dizziness and weakness   Immunizations Administered    Name Date Dose VIS Date Route   Pfizer COVID-19 Vaccine 07/31/2019  8:31 AM 0.3 mL 04/21/2019 Intramuscular   Manufacturer: [employer], [employer]   Lot: [id number]   NDC: [address]-5

## 2019-08-23 ENCOUNTER — Ambulatory Visit: Payer: Managed Care, Other (non HMO) | Attending: Internal Medicine

## 2019-08-23 DIAGNOSIS — Z23 Encounter for immunization: Secondary | ICD-10-CM

## 2019-08-23 NOTE — Progress Notes (Signed)
   Covid-19 Vaccination Clinic  Name:  Tyrese Capriotti    MRN: [id number] DOB: 05-23-1965  08/23/2019  Mr. Tracz was observed post Covid-19 immunization for 15 minutes without incident. He was provided with Vaccine Information Sheet and instruction to access the V-Safe system.   Mr. Ishman was instructed to call 911 with any severe reactions post vaccine: [name] Difficulty breathing  . Swelling of face and throat  . A fast heartbeat  . A bad rash all over body  . Dizziness and weakness   Immunizations Administered    Name Date Dose VIS Date Route   Pfizer COVID-19 Vaccine 08/23/2019  8:09 AM 0.3 mL 04/21/2019 Intramuscular   Manufacturer: [employer], [employer]   Lot: [id number]   NDC: [address]-0

## 2019-09-06 ENCOUNTER — Ambulatory Visit

## 2019-09-08 ENCOUNTER — Ambulatory Visit: Admit: 2019-09-08 | Payer: Medicare Other

## 2019-09-08 ENCOUNTER — Ambulatory Visit: Admitting: Internal Medicine

## 2019-09-08 NOTE — Progress Notes (Signed)
 .  Progress Notes  .  Patient: Arthur Young  Provider: Sharyn Lull  MD  .  DOB: 02-Sep-1965 Age: 54 Y Sex: Male  .  PCP: Berenice Primas  NP  Date: 09/08/2019  .  --------------------------------------------------------------------------------  .  REASON FOR APPOINTMENT  .  1. IN PERSON, CT SAME DAY  .  HISTORY OF PRESENT ILLNESS  .  GENERAL:   Arthur Young is a 54 year old male a history of HFrEF, HTN, DM,  COPD, and schizoaffective disorder (paranoid) presenting to  establish care for COPD. Arthur Young is aware of his diagnosis of  COPD and reports it is due to 'a curse and breathing poisonous  fumes'. He perservserated on people using chemical weapons  causing his COPD throughout his appt. He reports dyspnea on  exertion, being able to walk about 1 block before stopping to  catch his breath. No SoB with ADL's. He reports a cough  productive of scant white sputum, no hemoptysis.He has had  several admissions for acute decomensated HF in the past year (he  cannot recall how many, only recently established care at [facility]),  and was recently admitted in 05/2019 for shortness of breath,  thought to be due to COPD and CHF exacerbation. He also had an  AKI on CKD which was felt to be cardiorenal. He was diuresed,  treated with 5 days of PO prednisone and discharged home. At his  last visit I started him on Trelegy ellipta for mmRC 3 DoE (sob  while taking a shower and walking 1 block). He was non-adherent  to his previous inhalers, but since starting trelegy he has been  using his resuce inhaler less frequently (1-2x week) and does not  have SoB in the shower. He denies cough, wheezing. He tells me he  wakes up with chest pain some days but this is improving. No  hospitalizations since last visit. H has SoB while carrying  groceries but not while cooking or performing ADLs. He continues  to smoke 1PPD, he is not interested in quitting. Rescue inhaler  use frequency: 1-2x week, improved.COPD exacerbation: one this  year  1/21Hospitalizations: One this year, unclear how many he has  had at other hospitals. Intubations: NonePrednisone course: Once  this year, cannot recall if he received this last year. Has been  smoking since the age of 68y, and continues to smoke 1/2PPD (20  pack year smoking history). CXR: Left lower lobe infiltrates,  Stable cardiomegaly and right lateral chest wall pleural  thickening associated with multiple old rib fractures. PFTs: very  severe obstruction with BD responsivenessTTE: EF 30%, The tissue  Doppler diastolic parameters are indeterminate. There is severe  global hypokinesis of the left ventricle. Flattened septum is  consistent with RV pressure overload, Right ventricle is mild to  moderately dilated with moderately reduced function. TAPSE is  1.0cm. TDI S' is 7.7cm/s. The right atrium is moderately dilated.  The inter-atrial septum bows toward the leftatrium consistent  with high right atrial pressure. A dilated inferior vena cava  suggests increased right atrial pressure. The lack of respiratory  variation in the inferior vena cava diameter is noted. CT chest  09/08/19: Apically predominant centrilobular emphysema and diffuse  mild patchy groundglass opacity, consistent with chronic airspace  disease. 2. Multifocal tree-in-bud nodularity with several foci  of groundglass opacity in the lateral left upper lobe, likely  representing a inflammatory or infectious process. Short interval  follow-up to resolution or stability with repeat CT of the chest  in 3-6 months is recommended. 3. Multiple sub-6 mm solid and  subsolid pulmonary nodules. Many of these likely reflect mucous  impaction/inflammatory changes, though attention on follow-up  imaging is recommended. 4. Moderate bronchiectasis confined to  the left lower lobe, with associated nodular scarring versus  atelectasis, likely reflecting sequelae of prior  aspiration/inflammation.  .  CURRENT MEDICATIONS  .  Taking Acetaminophen 500 MG Tablet 1  tablet as needed Orally  every 6 hrs  Taking Albuterol Sulfate 108 (90 Base) MCG/ACT Aerosol Powder  Breath Activated 2 puff as needed Inhalation every 4 hrs  Taking Ammonium Lactate 5 % Lotion 1 application Externally Once  a day as needed for itching  Taking Aspirin 81 MG Tablet Chewable 1 tablet Orally Once a day  Taking Atorvastatin Calcium 20 MG Tablet 1 tablet Orally Once a  day  Taking Brimonidine Tartrate 0.15 % Solution 1 drop to both eyes  Ophthalmic Twice a day  Taking Carbamazepine 200 MG Tablet 3 tablets Orally Once a day at  bedtime  Taking Carbamazepine 200 mg Tablet 2 tablets Orally in the AM  Taking Docusate Sodium 100 MG Capsule 1 capsule Orally Twice a  day  Taking Furosemide 20 MG Tablet 2 tablets Orally Once a day  Taking Metformin HCl 1000 MG Tablet 1 tablet Orally Twice a day  Taking Metoprolol Succinate ER 50 MG Tablet Extended Release 24  Hour 1.5 tablets (75 mg) Orally Once a day  Taking Mirtazapine 15 MG Tablet 1 tablet at bedtime Orally Once a  day  Taking Olanzapine 10 MG Tablet 1 tablet Orally Once a day  Taking Polyethylene Glycol 3350 17 GM Packet 1 as needed for  constipation Orally Once a day  Taking Quetiapine Fumarate 300 MG Tablet 1 tablet at bedtime  Orally Once a day  Taking Sennosides 8.6 MG Tablet 1 tablet at bedtime as needed  Orally Once a day  Taking Tessalon Perles 100 MG Capsule 1 capsule as needed Orally  Three times a day  Taking Trelegy Ellipta 200-62.5-25 MCG/INH Aerosol Powder Breath  Activated 1 puff Inhalation Once a day  Not-Taking/PRN Losartan Potassium 50 MG Tablet 1 tablet Orally  Once a day  Unknown Dorzolamide HCl 2 % Solution 1 drop to both eyes  Ophthalmic Twice a day  Medication List reviewed and reconciled with the patient  .  PAST MEDICAL HISTORY  .  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  ALLERGIES  .  Depakote  Lithium  .  SURGICAL HISTORY  .  No Surgical History  documented.  [name]  FAMILY HISTORY  .  No family history of heart or lung disease.  .  SOCIAL HISTORY  .  .  Abuse/Neglect  Do you feel unsafe in your relationships?No  Have you ever been hit, kicked, punched or otherwise hurt by  someone in the past year? No  .  Smokes 1 PPD since age 69, 37 pack yr history of smokingNo  alcohol No drug use Lives in a group home From [place], moved to  [place] about 30y back.  .  HOSPITALIZATION/MAJOR DIAGNOSTIC PROCEDURE  .  ADHF, COPD 12/31-05/19/2019  ADHF, COPD 1/28-06/13/2019  .  REVIEW OF SYSTEMS  .  ADULT Pulmonary:  .  Constitutional    no fever, chills, night sweats . Eyes    no  vision changes . HENT    no nasal congestion, runny nose .  Respiratory    as per HPI . Cardiovascular    no palpitations .  Gastrointestinal    no heartburn, reflux .  [name]  VITAL SIGNS  .  Pain scale 0, Ht-in 62, Wt-lbs 171, BMI 31.27, BP 125/85, HR 104,  RR 22, Temp 98.4, Oxygen sat % 88RA.  [name]  PHYSICAL EXAMINATION  .  Pulmonary:  General Appearance:  Normal.  Nasal Mucosa  moist, pink, without exudate, normal turbinates,  Normal.  Oropharynx  Normal.  Neck  supple, no JVD.  Heart Exam  regular rate and rhythm, normal S1/S2 progression.  Extremities  2+ pitting pedal edema.  Chest inspection   .  Faint high pitched expiratory wheezing  .  .  .  ASSESSMENTS  .  Severe chronic obstructive pulmonary disease - J44.9 (Primary)  .  Pleural thickening - J92.9  .  Pulmonary hypertension - I27.20  .  Cigarette nicotine dependence without complication - F17.210  .  Bronchiectasis without complication - J47.9  .  Ground glass opacity present on imaging of lung - R91.8  .  Mr. Gulino is a 54 year old male a history of HFrEF, HTN, DM,  COPD, and schizoaffective disorder (paranoid) presenting with  severe COPD with exacerbations. #Severe COPD: Symptoms are  improved since he started using trelegy ellipta, and he has not  had exacerbations/hospitalizations in the past 2 months. COPD is  GOLD D (stage 4). He continues to  smoke 1PPD and is not intersted  in quitting. He has borderline O2 sats at rest (88-89%) however  given his continued active smoking and poor insight into his  disease I worry about fire hazard. We will re-address this at his  next visit. -Continue ICS/LAMA/LABA trelegy -Counselled re  inhaler adherence -Continue albuterol prn-O2 sat at rest 88-89%  likely drops to <88 with minimal activity. Given suggestion of PH  on TTE (likely group 2,3) and borderline sats he would benefit  from supplemental O2, however, given his continued smoking and  limited insight into disease, I worry about fire hazard and I  believe the risks of O2 would outweigh benefits.-declined  pulmonary rehab-once adherent to inhaler regimen,will assess  frequency of exacerbations and need for additional therapy like  roflumilast/azithromycin-Smoking cessation counselling: not  interested in quitting, counselled re harmful effects of smoking.  -PFTs with lung volumes and DLCO #Rt PLeural thickening: Likely  related to healed rib # seen on CT chest.#Smoking: Counselled re  smoking cessation, not interested in quitting#Elevated RVSP:  NOted on TTE, PH likely combination of grp 2,3 given HFrEF. TTE  performed during a HFpEF and COPD exacerbation, which may lead to  higher PA pressures and could overestimate RVSP. -Management of  HFrEF per cardiology and COPD as above recommended. -Refused  supplemental O2 (and poor candidate).#LLL Bronchiectasis with  scarring: likely chronic due to prior infection vs  aspiration.-Send SSA/SSB and RF to evaluate furher (he left  clinci witbout obtaining labs today) -declined HIV#Upper lobe  predominant GGO with associated tree in bud opacities: may  represent atypical infection/inflammation vs pulmonary edema. He  has had no symptoms of fevers, chills, change in cough to suggest  active infection. Unclear if this is new or old. -Look into  obtaining prior CT imaging in the event he has been admitted to  other  hospitals-Repeat CT chest in 3-15months at next visit #Lung  nodules: multiple sub 6mm solid and part solid nodules. Will be  followed on CT chest as above. RTC in 3-4 months.  [name]  TREATMENT  .  Severe chronic obstructive pulmonary disease  LAB: Rheumatoid Factor (RF)  .  LAB: Ext Nclr Antg(Ena) SSA (SSA)  .  LAB: Ext Nclr Antg(Ena) SSB  .  Pulmonary Function Test  .  FOLLOW UP  .  4 Months  .  [name]  Appointment Provider: Sharyn Lull  .  Electronically signed by Sharyn Lull MD on  09/19/2019 at 10:39 AM EDT  .  Document electronically signed by Sharyn Lull  MD  .

## 2019-09-08 NOTE — Progress Notes (Signed)
 * * Arthur Young **DOB:** 05/05/1966 (53 yo M) **Acc No.** [id number] **DOS:**  09/08/2019    ---       Arthur Young**    ------    19 Y old Male, DOB: 09/23/1965, External MRN: [id number]    Account Number: [id number]    [address] [place], [id number]    Home: [phone]    Insurance: M80 [employer] C[employer]/ONE CARE    PCP: Arthur Primas, NP Referring: Arthur Primas, NP    Appointment Facility: Pulmonology        * * *    09/08/2019  **Appointment Provider:** Arthur Young **CHN#:** [id number]    ------    ---       **Current Medications**    ---    Taking    * Acetaminophen 500 MG Tablet 1 tablet as needed Orally every 6 hrs    ---    * Albuterol Sulfate 108 (90 Base) MCG/ACT Aerosol Powder Breath Activated 2 puff as needed Inhalation every 4 hrs    ---    * Ammonium Lactate 5 % Lotion 1 application Externally Once a day as needed for itching    ---    * Aspirin 81 MG Tablet Chewable 1 tablet Orally Once a day    ---    * Atorvastatin Calcium 20 MG Tablet 1 tablet Orally Once a day    ---    * Brimonidine Tartrate 0.15 % Solution 1 drop to both eyes Ophthalmic Twice a day    ---    * Carbamazepine 200 MG Tablet 3 tablets Orally Once a day at bedtime    ---    * Carbamazepine 200 mg Tablet 2 tablets Orally in the AM    ---    * Docusate Sodium 100 MG Capsule 1 capsule Orally Twice a day    ---    * Furosemide 20 MG Tablet 2 tablets Orally Once a day    ---    * Metformin HCl 1000 MG Tablet 1 tablet Orally Twice a day    ---    * Metoprolol Succinate ER 50 MG Tablet Extended Release 24 Hour 1.5 tablets (75 mg) Orally Once a day    ---    * Mirtazapine 15 MG Tablet 1 tablet at bedtime Orally Once a day    ---    * Olanzapine 10 MG Tablet 1 tablet Orally Once a day    ---    * Polyethylene Glycol 3350 17 GM Packet 1 as needed for constipation Orally Once a day    ---    * Quetiapine Fumarate 300 MG Tablet 1 tablet at bedtime Orally Once a day    ---    * Sennosides 8.6 MG Tablet 1 tablet at bedtime as  needed Orally Once a day    ---    * Tessalon Perles 100 MG Capsule 1 capsule as needed Orally Three times a day    ---    * Trelegy Ellipta 200-62.5-25 MCG/INH Aerosol Powder Breath Activated 1 puff Inhalation Once a day    ---    Not-Taking/PRN    * Losartan Potassium 50 MG Tablet 1 tablet Orally Once a day    ---    Unknown    * Dorzolamide HCl 2 % Solution 1 drop to both eyes Ophthalmic Twice a day    ---    * Medication List reviewed and reconciled with the patient    ---      **  Past Medical History**    ---      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        ---    COPD.        ---    T2DM.        ---    HTN.        ---    HLD.        ---    Schizoaffective disorder.        ---    Insomnia.        ---    Cataracts.        ---      **Surgical History**    ---      No Surgical History documented.    ---      **Family History**    ---      No family history of heart or lung disease.    ---      **Social History**    ---    Abuse/Neglect    Do you feel unsafe in your relationships? _No_    Have you ever been hit, kicked, punched or otherwise hurt by someone in the  past year? _No_  Smokes 1 PPD since age 30, 37 pack yr history of smoking    No alcohol    No drug use    Lives in a group home    From [place], moved to [place] about 30y back.    ---      **Allergies**    ---      Depakote    ---    Lithium    ---      **Hospitalization/Major Diagnostic Procedure**    ---      ADHF, COPD 12/31-05/19/2019    ---    ADHF, COPD 1/28-06/13/2019    ---      **Review of Systems**    ---     _ADULT Pulmonary_ :    Constitutional no fever, chills, night sweats. Eyes no vision changes. HENT no  nasal congestion, runny nose. Respiratory as per HPI. Cardiovascular no  palpitations. Gastrointestinal no heartburn, reflux.          **Reason for Appointment**    ---      1\. IN PERSON, CT SAME DAY    ---      **Assessments**    ---    1\. Severe chronic obstructive pulmonary disease - J44.9 (Primary)    ---    2\. Pleural  thickening - J92.9    ---    3\. Pulmonary hypertension - I27.20    ---    4\. Cigarette nicotine dependence without complication - F17.210    ---    5\. Bronchiectasis without complication - J47.9    ---    6\. Ground glass opacity present on imaging of lung - R91.8    ---     Arthur Young is a 54 year old male a history of HFrEF, HTN, DM, COPD, and  schizoaffective disorder (paranoid) presenting with severe COPD with  exacerbations.    #Severe COPD: Symptoms are improved since he started using trelegy ellipta,  and he has not had exacerbations/hospitalizations in the past 2 months. COPD  is GOLD D (stage 4). He continues to smoke 1PPD and is not intersted in  quitting. He has borderline O2 sats at rest (88-89%) however given his  continued active smoking and poor insight into his disease I worry about fire  hazard. We will  re-address this at his next visit.    -Continue ICS/LAMA/LABA trelegy     -Counselled re inhaler adherence     -Continue albuterol prn    -O2 sat at rest 88-89% likely drops to <88 with minimal activity. Given suggestion of PH on TTE (likely group 2,3) and borderline sats he would benefit from supplemental O2, however, given his continued smoking and limited insight into disease, I worry about fire hazard and I believe the risks of O2 would outweigh benefits.    -declined pulmonary rehab    -once adherent to inhaler regimen,will assess frequency of exacerbations and need for additional therapy like roflumilast/azithromycin    -Smoking cessation counselling: not interested in quitting, counselled re harmful effects of smoking.     -PFTs with lung volumes and DLCO     #Rt PLeural thickening: Likely related to healed rib # seen on CT chest.    #Smoking: Counselled re smoking cessation, not interested in quitting    #Elevated RVSP: NOted on TTE, PH likely combination of grp 2,3 given HFrEF.  TTE performed during a HFpEF and COPD exacerbation, which may lead to higher  PA pressures and could  overestimate RVSP.    -Management of HFrEF per cardiology and COPD as above recommended.     -Refused supplemental O2 (and poor candidate).    #LLL Bronchiectasis with scarring: likely chronic due to prior infection vs  aspiration.    -Send SSA/SSB and RF to evaluate furher (he left clinci witbout obtaining labs today)     -declined HIV    #Upper lobe predominant GGO with associated tree in bud opacities: may  represent atypical infection/inflammation vs pulmonary edema. He has had no  symptoms of fevers, chills, change in cough to suggest active infection.  Unclear if this is new or old.    -Look into obtaining prior CT imaging in the event he has been admitted to other hospitals    -Repeat CT chest in 3-73months at next visit     #Lung nodules: multiple sub 6mm solid and part solid nodules. Will be followed  on CT chest as above.    RTC in 3-4 months.    ---      **Treatment**    ---      **1\. Severe chronic obstructive pulmonary disease**    _LAB: Rheumatoid Factor (RF)_    _LAB: Ext Nclr Antg(Ena) SSA (SSA)_    _LAB: Ext Nclr Antg(Ena) SSB_    _PROCEDURE: Pulmonary Function Test_    ---      **Follow Up**    ---    4 Months      **History of Present Illness**    ---     _GENERAL_ :    Mr. Laneve is a 54 year old male a history of HFrEF, HTN, DM, COPD, and  schizoaffective disorder (paranoid) presenting to establish care for COPD.    Mr. Boehning is aware of his diagnosis of COPD and reports it is due to 'a curse  and breathing poisonous fumes'. He perservserated on people using chemical  weapons causing his COPD throughout his appt. He reports dyspnea on exertion,  being able to walk about 1 block before stopping to catch his breath. No SoB  with ADL's. He reports a cough productive of scant white sputum, no  hemoptysis.    He has had several admissions for acute decomensated HF in the past year (he  cannot recall how many, only recently established care at [facility]), and was  recently admitted  in 05/2019 for  shortness of breath, thought to be due to COPD  and CHF exacerbation. He also had an AKI on CKD which was felt to be  cardiorenal. He was diuresed, treated with 5 days of PO prednisone and  discharged home.    At his last visit I started him on Trelegy ellipta for mmRC 3 DoE (sob while  taking a shower and walking 1 block). He was non-adherent to his previous  inhalers, but since starting trelegy he has been using his resuce inhaler less  frequently (1-2x week) and does not have SoB in the shower.    He denies cough, wheezing. He tells me he wakes up with chest pain some days  but this is improving. No hospitalizations since last visit. H has SoB while  carrying groceries but not while cooking or performing ADLs.    He continues to smoke 1PPD, he is not interested in quitting.    Rescue inhaler use frequency: 1-2x week, improved.    COPD exacerbation: one this year 1/21    Hospitalizations: One this year, unclear how many he has had at other  hospitals.    Intubations: None    Prednisone course: Once this year, cannot recall if he received this last  year.    Has been smoking since the age of 48y, and continues to smoke 1/2PPD (20 pack  year smoking history).    CXR: Left lower lobe infiltrates, Stable cardiomegaly and right lateral chest  wall pleural thickening associated with multiple old rib fractures.    PFTs: very severe obstruction with BD responsiveness    TTE: EF 30%, The tissue Doppler diastolic parameters are indeterminate. There  is severe global hypokinesis of the left ventricle. Flattened septum is  consistent with RV pressure overload, Right ventricle is mild to moderately  dilated with moderately reduced function. TAPSE is 1.0cm. TDI S' is 7.7cm/s.  The right atrium is moderately dilated. The inter-atrial septum bows toward  the left    atrium consistent with high right atrial pressure. A dilated inferior vena  cava suggests increased right atrial pressure. The lack of respiratory  variation in the  inferior vena cava diameter is noted.    CT chest 09/08/19: Apically predominant centrilobular emphysema and diffuse  mild patchy groundglass opacity, consistent with chronic airspace disease.    2\. Multifocal tree-in-bud nodularity with several foci of groundglass opacity  in the lateral left upper lobe, likely representing a inflammatory or  infectious process. Short interval follow-up to resolution or stability with  repeat CT of the chest in 3-6 months is recommended.    3\. Multiple sub-6 mm solid and subsolid pulmonary nodules. Many of these  likely reflect mucous impaction/inflammatory changes, though attention on  follow-up imaging is recommended.    4\. Moderate bronchiectasis confined to the left lower lobe, with associated  nodular scarring versus atelectasis, likely reflecting sequelae of prior  aspiration/inflammation.      **Vital Signs**    ---    Pain scale 0, Ht-in 62, Wt-lbs 171, BMI 31.27, BP 125/85, HR 104, RR 22, Temp  98.4, Oxygen sat % 88RA.      **Physical Examination**    ---     _Pulmonary_ :    General Appearance: Normal.    Nasal Mucosa moist, pink, without exudate, normal turbinates, Normal.    Oropharynx Normal.    Neck supple, no JVD.    Heart Exam regular rate and rhythm, normal S1/S2 progression.    Extremities 2+  pitting pedal edema.    Chest inspection    Faint high pitched expiratory wheezing    .        **Appointment Provider:** Arthur Young    Electronically signed by Arthur Lull MD on 09/19/2019 at 10:39 AM EDT    Sign off status: Completed        * * *        Pulmonology    [address]    [place], [address]    [place], [place] [address]    Tel: [phone]    Fax: [phone]              * * *          Progress Note: Arthur Young 09/08/2019    ---    Note generated by [employer] EMR/PM Software ([email or web address])

## 2019-09-19 LAB — HX ASTHMA - PFTS

## 2020-01-10 ENCOUNTER — Observation Stay: Admission: EM | Admit: 2020-01-10 | Payer: Medicare Other | Source: Intra-hospital

## 2020-01-10 ENCOUNTER — Emergency Department
Admit: 2020-01-10 | Disposition: A | Discharge status: Other Acute Inpatient Hospital | Source: Ambulatory Visit | Attending: Emergency Medicine | Admitting: Emergency Medicine

## 2020-01-10 ENCOUNTER — Inpatient Hospital Stay
Admit: 2020-01-10 | Disposition: A | Discharge status: Skilled Nursing Facility | Source: Emergency Department | Attending: Internal Medicine | Admitting: Internal Medicine

## 2020-01-10 ENCOUNTER — Ambulatory Visit

## 2020-01-10 LAB — HX POINT OF CARE: HX GLUCOSE-POCT: 152 mg/dL — ABNORMAL HIGH (ref 70–139)

## 2020-01-10 LAB — HX HEM-ROUTINE
HX BASO #: 0.1 10*3/uL (ref 0.0–0.2)
HX BASO: 1 %
HX EOSIN #: 0.1 10*3/uL (ref 0.0–0.5)
HX EOSIN: 1 %
HX HCT: 59.9 % — ABNORMAL HIGH (ref 37.0–47.0)
HX HGB: 18.2 g/dL — ABNORMAL HIGH (ref 13.5–16.0)
HX IMMATURE GRANULOCYTE#: 0.1 10*3/uL (ref 0.0–0.1)
HX IMMATURE GRANULOCYTE: 1 %
HX LYMPH #: 1.5 10*3/uL (ref 1.0–4.0)
HX LYMPH: 18 %
HX MCH: 31.2 pg (ref 26.0–34.0)
HX MCHC: 30.4 g/dL — ABNORMAL LOW (ref 32.0–36.0)
HX MCV: 102.7 fL — ABNORMAL HIGH (ref 80.0–98.0)
HX MONO #: 0.8 10*3/uL (ref 0.2–0.8)
HX MONO: 10 %
HX MPV: 10.6 fL (ref 9.1–11.7)
HX NEUT #: 6 10*3/uL (ref 1.5–7.5)
HX NRBC #: 0 10*3/uL
HX NUCLEATED RBC: 0 %
HX PLT: 182 10*3/uL (ref 150–400)
HX RBC BLOOD COUNT: 5.83 M/uL — ABNORMAL HIGH (ref 4.20–5.50)
HX RDW: 15.7 % — ABNORMAL HIGH (ref 11.5–14.5)
HX SEG NEUT: 70 %
HX WBC: 8.6 10*3/uL (ref 4.0–11.0)

## 2020-01-10 LAB — HX MICRO-RESP VIRAL PANEL: HX COVID-19 (SARS-COV-2) RAPID: NEGATIVE

## 2020-01-10 LAB — HX CHEM-ENZ-FRAC
HX B NATRIURETIC PEPTIDE (BNP): 1085 pg/mL — ABNORMAL HIGH (ref 0–100)
HX TROPONIN I: 0.04 ng/mL — ABNORMAL HIGH (ref 0.00–0.03)

## 2020-01-10 LAB — HX DIABETES: HX GLUCOSE: 95 mg/dL (ref 70–139)

## 2020-01-10 LAB — HX CHEM-OTHER: HX MAGNESIUM: 2 mg/dL (ref 1.6–2.6)

## 2020-01-10 LAB — HX CHEM-PANELS: HX WHOLE BLOOD POTASSIUM: 4.4 meq/L (ref 3.6–5.1)

## 2020-01-10 NOTE — Progress Notes (Signed)
 **Subjective**    Subjective Overnight:    - Admitted        This AM:    -- Endorsing shakiness of upper and lower extremities that he states is not  new and started when he began one of his "psychiatrist drugs"    -- Denies chest pain, nausea, vomiting, diarrhea, and headache.    **Subjective**    Subjective Overnight:    - Admitted        This AM:    -- Endorsing shakiness of upper and lower extremities that he states is not  new and started when he began one of his "psychiatrist drugs"    -- Denies chest pain, nausea, vomiting, diarrhea, and headache.    **ROS**    Complete Review of Systems All other systems reviewed and negative except as  noted in the HPI.    **ROS**    Complete Review of Systems All other systems reviewed and negative except as  noted in the HPI.    **Exam**    Comment Gen: NAD    HEENT: MMM, PERRL, EOMI    CV: RRR, no murmurs, rubs, gallops    Lung: overall diminished; +Bilateral Diffuse Inspiratory and Expiratory  wheezing    Abd: Soft, Nontender, no rebound, no guarding +Distended, +BS    Ext: WWP +Bilateral lower extremity edema to the shins Right > Left    Neuro: no focal neurologic deficits; AOx3 +Resting tremor of upper extremities  +5/5 Extremity strengths.      Vital Signs    01/11/2020 06:34              -  How Obtained: Stand. Scale          -  Weight: 85.5kg      01/11/2020 04:30              -  Temperature: 36.8 (35-37.8Cel)          -  Site: Oral          -  Heart Rate: 86 (60-90)          -  Site: Monitor          -  BP: 103/83 (90-140/60-90)          -  Site: Right Arm          -  Position: Lying          -  Method: Automated          -  Respirations: 19 (12-20)          -  O2 Saturation (%): 92          -  O2 Amount: 1.5 LPM          -  O2 Delivery Method: Nasal Cannula          -  MEWS Vital Sign Score: 0      01/11/2020 01:54              -  Lattie Corns Fall Risk Total: 45      CFS Vital Signs CS    01/11/2020 06:59              -  Shift Intake  Total:          -  Shift Output Total:          -  Shift Balance:          I have reviewed and agree with  vital signs as listed in the EMR Yes Time  01/11/2020 10:00.    **Exam**    Comment Gen: NAD    HEENT: MMM, PERRL, EOMI    CV: RRR, no murmurs, rubs, gallops    Lung: overall diminished; +Bilateral Diffuse Inspiratory and Expiratory  wheezing    Abd: Soft, Nontender, no rebound, no guarding +Distended, +BS    Ext: WWP +Bilateral lower extremity edema to the shins Right > Left    Neuro: no focal neurologic deficits; AOx3 +Resting tremor of upper extremities  +5/5 Extremity strengths.      Vital Signs    01/11/2020 06:34              -  How Obtained: Stand. Scale          -  Weight: 85.5kg      01/11/2020 04:30              -  Temperature: 36.8 (35-37.8Cel)          -  Site: Oral          -  Heart Rate: 86 (60-90)          -  Site: Monitor          -  BP: 103/83 (90-140/60-90)          -  Site: Right Arm          -  Position: Lying          -  Method: Automated          -  Respirations: 19 (12-20)          -  O2 Saturation (%): 92          -  O2 Amount: 1.5 LPM          -  O2 Delivery Method: Nasal Cannula          -  MEWS Vital Sign Score: 0      01/11/2020 01:54              -  Lattie Corns Fall Risk Total: 45      CFS Vital Signs CS    01/11/2020 06:59              -  Shift Intake Total:          -  Shift Output Total:          -  Shift Balance:          I have reviewed and agree with vital signs as listed in the EMR Yes Time  01/11/2020 10:00.    **Labs**    All current labs have been reviewed by myself as of 01/11/2020 10:00.    **Labs**    All current labs have been reviewed by myself as of 01/11/2020 10:00.    **Radiology**    All current radiographs have been reviewed by myself as of 01/11/2020 10:00.    **Radiology**    All current radiographs have been reviewed by myself as of 01/11/2020 10:00.    **Assessment and Plan**    Medication               -   **IRON SUCROSE COMPLEX (VENOFER)** 200 MG Intravenous QDAY for 2 Doses, Clinician Dir:ON DAYS 6 AND 7.   Pending Date: 01/16/2020          -   **REMOVE PATCH** 1 PATCH Miscellaneous QDAY for 60 Days, Clinician Dir:OLD  NICOTINE PATCH   Pending Date: 01/12/2020          -   **IRON SUCROSE COMPLEX (VENOFER)** 200 MG Intravenous QDAY First Dose Now for 3 Doses, Clinician Dir:ON DAYS 1, 2 AND 3.   Pending Date: 01/11/2020          -   **PATCH INTACT QSHIFT (PATCH INTACT INSPECTION)** 1 PATCH Topical CHECK for 60 Days, Clinician XFG:[id number] (NICOTINE) PATCH EVERY SHIFT           -   **NICOTINE TRANSDERMAL (HABITROL TRANSDERMAL)** 21 MG Topical QDAY First Dose Now for 60 Days           -   **CARBAMAZEPINE (TEGRETOL)** 400 MG Oral QAM for 60 Days           -   **ASPIRIN CHEWABLE** 81 MG Oral QDAY for 60 Days           -   **CHLORTHALIDONE (HYGROTON)** 25 MG Oral QDAY for 60 Days           -   **FUROSEMIDE (LASIX)** 80 MG Intravenous ONE TIME for 1 Doses           -   ** ** ** **INSULIN LISPRO (HUMALOG) (HUMALOG)******** Sliding Scale Subcutaneous TIDWM for 60 Days      For Glucose 80 to 150 Give 0 Units    For Glucose 151 to 200 Give 2 Units    For Glucose 201 to 250 Give 3 Units    For Glucose 251 to 300 Give 4 Units    Notify Physician if: Call HO for glucose > 300          -   **INSULIN LISPRO (HUMALOG) (HUMALOG)** 4 UNITS Subcutaneous TIDWM for 60 Days           -   **IPRATROPIUM 0.02% INH (ATROVENT 0.02%)** 0.25 MG Inhalation Q6H for 60 Days, Clinician JIR:[id number] FOR TRELEGY           -   **Ciprofloxacin** 750 mg = 1 TAB Oral BID First Dose Now          -   **DORZOLAMIDE 2% OPHTH (TRUSOPT 2% OPHTH)** 1 DROP Both Eyes SOL BID for 60 Days           -   **CARBAMAZEPINE (TEGRETOL)** 600 MG Oral QHS for 60 Days           -   **QUETIAPINE FUMARATE (SEROQUEL)** 400 MG Oral QHS for 60 Days           -   **DOCUSATE SODIUM (COLACE)** 100 MG Oral BID for 60 Days           -    **OLANZAPINE (ZYPREXA)** 10 MG Oral QHS for 60 Days           -   **ATORVASTATIN (LIPITOR)** 20 MG Oral QHS for 60 Days           -   **INSULIN GLARGINE(LANTUS) (INSULIN LANTUS)** 10 UNITS Subcutaneous QHS for 60 Days           -   **MIRTAZAPINE (REMERON)** 15 MG Oral QHS for 60 Days           -   **SENNA (SENOKOT)** 8.6 MG Oral BID for 60 Days           -   **HYDROCORTISONE 1% CREAM** 1 APP Topical BID for 60 Days, Clinician Dir:SEE EXTENDED INSTRUCTIONS INTERCHANGE FOR 2.5%           -   **  BUDESONIDE INHALATION (PULMICORT RESPULES)** 0.25 MG Inhalation BID for 60 Days, Clinician KGM:[id number] FOR TRELEGY SEE ORDER #31           -   **FORMOTEROL FUMARATE (PERFOROMIST)** 20 MCG Inhalation BID for 60 Days, Clinician QIH:[id number] FOR TRELEGY SEE ORDER #31           -   **BRIMONIDINE 0.2% OPHTH (ALPHAGAN 0.2% OPHTH)** 1 DROP Both Eyes SOL BID for 60 Days, Clinician FIE:[id number] FOR 0.15% STRENGTH           -   **DEXTROSE 50% SYRINGE (DEXTROSE 50%)** 12.5 G Intravenous PRN BG < 60 AND CAN NOT TAKE PO for 60 Days           -   **GLUCAGON** 1 MG Intramuscular PRN BG < 60 AND NO IV ACCESS for 60 Days, Clinician Dir:MAY BE GIVEN IM OR SUB-Q           -   **BISACODYL (DULCOLAX)** 10 MG Rectal QDAYPRN CONSTIPATION for 60 Days           -   **HEPARIN** 5000 UNITS Subcutaneous Q12H for 60 Days           -   **IPRATROPIUM/ALBUTEROL SULFATE (DUONEB)** 3 ML Inhalation Q4HPRN SOB/WHEEZING for 60 Days           -   **POLYETHYLENE GLYCOL (MIRALAX)** 17 G Oral QDAYPRN CONSTIPATION for 60 Days           -   **AMMONIUM LACTATE 12% LOTION (LAC-HYDRIN 12%)** 1 APP Topical QDAYPRN RASH for 60 Days           -   **ALBUTEROL INHALER (VENTOLIN HFA)** 2 PUFF Inhalation Q4HPRN SHORTNESS OF BREATH for 60 Days           -   **ACETAMINOPHEN (TYLENOL)** 650 MG Oral Q6HPRN PAIN for 60 Days           -   **BENZONATATE** 100 MG Oral TIDPRN COUGH for 60 Days           Comment Patient is a 54 y/o male with  history of HFrEF (EF 30% 05/2019), COPD  not on home O2, HTN, DM, and, Schizoaffective disorder presenting from a group  home for shortness of breath.        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 History of previous admissions for CHF  exacerbation requiring IV Diuresis. Previous dry weight of 75kg with most  recent weight 81kg in the ED. Etiology of exacerbation unclear. Worsening  heart failure 2/2 viral infection vs. recent MI vs. medication non-compliance.  Lives in a group home, will reach out to determine patient adherence to  medication regimen.    --- PRELOAD: Fluid overloaded on exam with lower extremity edema and CXR  notable for increased pulmonary vascular congestion with left pleural effusion    -- 80mg  IV Lasix this AM    ---> Will follow-up PM diuresis to adjust as needed    -- strict I&O    -- daily standing weights        --PUMP-- HFrEF (LVEF 30%)    -- Inpatient TTE    -- Holding home Metoprolol        --- CORONARIES--    -- ASA and Atorvastatin 80mg         --- Neurohormonal --- Previously discontinued Spironolactone for  hyperkalemia  in the setting of K-sparing diuretics    -- Can consider Spironolactone for GDMT        #Acute COPD Exacerbation - PFTS (07/21/19) FEV1 24% with FEV1/FVC 40% GOLD IV.    -- Prednisone 40mg  qd 5 days    -- Azithromycin 500mg  IV Q24H    -- SSA/SSB, RF, Anti-CCP serologies per previous Pulm note    -- Duonebs Q4HPRN        #AKI    Cr 1.85 on admission, baseline Cr possibly ~1.2-1.3, but unknown if new Cr  baseline is 1.85. AKI possibly due to cardiorenal in the setting of CHF  exacerbation vs. new baseline. Will trend and follow-up diuretics response.    - Diuresis as above        #Anemia    #Polycythemia    Hgb 18.2 and Hct 59.9 possibly 2/2 COPD vs possible OSA. Can consider empiric  treatment of OSA with trial of CPAP if patient  desaturates overnight. Iron  studies significant for Iron Deficiency anemia Iron 47, %Fe Saturation 12,  Ferritin 68, Transferrin 270.    - IV Venofer iso heart failure    - Consider outpatient workup (JAK2 testing and BM)    - EPO level    - Lower Extremity Ultrasound -- Right > Left lower extremity edema iso  polycythemia    - outpatient sleep study            #Constipation    Pt has hx of constipation and reports constipation on day of admission for the  past several days    - Senna BID    - cont home miralax and colace        #DM    On Metformin outpatient.    - A1c Pending    - 10u Lantus and 4u Lispro    - SSI TIDWM    - hold home metformin        #Schizoaffective disorder    - Carbamazepine 400 mg qam and 600 mg qhs    - Mirtazapine 15 mg qhs    - Zyprexa 10 mg qhs    - Seroquel 300 mg qhs        #HTN    #HLD: Cont Home Atorvastatin        #Social: Pt lives in a group home        Full code (confirmed)    HH Diet    SQ heparin.    **Assessment and Plan**    Medication              -   **IRON SUCROSE COMPLEX (VENOFER)** 200 MG Intravenous QDAY for 2 Doses, Clinician Dir:ON DAYS 6 AND 7.   Pending Date: 01/16/2020          -   **REMOVE PATCH** 1 PATCH Miscellaneous QDAY for 60 Days, Clinician Dir:OLD NICOTINE PATCH   Pending Date: 01/12/2020          -   **IRON SUCROSE COMPLEX (VENOFER)** 200 MG Intravenous QDAY First Dose Now for 3 Doses, Clinician Dir:ON DAYS 1, 2 AND 3.   Pending Date: 01/11/2020          -   **PATCH INTACT QSHIFT (PATCH INTACT INSPECTION)** 1 PATCH Topical CHECK for 60 Days, Clinician KZS:[id number] (NICOTINE) PATCH EVERY SHIFT           -   **NICOTINE TRANSDERMAL (HABITROL TRANSDERMAL)** 21 MG Topical QDAY First Dose Now for 60 Days           -   **  CARBAMAZEPINE (TEGRETOL)** 400 MG Oral QAM for 60 Days           -   **ASPIRIN CHEWABLE** 81 MG Oral QDAY for 60 Days           -   **CHLORTHALIDONE (HYGROTON)** 25 MG Oral QDAY for 60 Days           -   **FUROSEMIDE (LASIX)** 80  MG Intravenous ONE TIME for 1 Doses           -   ** ** ** **INSULIN LISPRO (HUMALOG) (HUMALOG)******** Sliding Scale Subcutaneous TIDWM for 60 Days      For Glucose 80 to 150 Give 0 Units    For Glucose 151 to 200 Give 2 Units    For Glucose 201 to 250 Give 3 Units    For Glucose 251 to 300 Give 4 Units    Notify Physician if: Call HO for glucose > 300          -   **INSULIN LISPRO (HUMALOG) (HUMALOG)** 4 UNITS Subcutaneous TIDWM for 60 Days           -   **IPRATROPIUM 0.02% INH (ATROVENT 0.02%)** 0.25 MG Inhalation Q6H for 60 Days, Clinician BMW:[id number] FOR TRELEGY           -   **Ciprofloxacin** 750 mg = 1 TAB Oral BID First Dose Now          -   **DORZOLAMIDE 2% OPHTH (TRUSOPT 2% OPHTH)** 1 DROP Both Eyes SOL BID for 60 Days           -   **CARBAMAZEPINE (TEGRETOL)** 600 MG Oral QHS for 60 Days           -   **QUETIAPINE FUMARATE (SEROQUEL)** 400 MG Oral QHS for 60 Days           -   **DOCUSATE SODIUM (COLACE)** 100 MG Oral BID for 60 Days           -   **OLANZAPINE (ZYPREXA)** 10 MG Oral QHS for 60 Days           -   **ATORVASTATIN (LIPITOR)** 20 MG Oral QHS for 60 Days           -   **INSULIN GLARGINE(LANTUS) (INSULIN LANTUS)** 10 UNITS Subcutaneous QHS for 60 Days           -   **MIRTAZAPINE (REMERON)** 15 MG Oral QHS for 60 Days           -   **SENNA (SENOKOT)** 8.6 MG Oral BID for 60 Days           -   **HYDROCORTISONE 1% CREAM** 1 APP Topical BID for 60 Days, Clinician Dir:SEE EXTENDED INSTRUCTIONS INTERCHANGE FOR 2.5%           -   **BUDESONIDE INHALATION (PULMICORT RESPULES)** 0.25 MG Inhalation BID for 60 Days, Clinician OZD:[id number] FOR TRELEGY SEE ORDER #31           -   **FORMOTEROL FUMARATE (PERFOROMIST)** 20 MCG Inhalation BID for 60 Days, Clinician ZDG:[id number] FOR TRELEGY SEE ORDER #31           -   **BRIMONIDINE 0.2% OPHTH (ALPHAGAN 0.2% OPHTH)** 1 DROP Both Eyes SOL BID for 60 Days, Clinician JOA:[id number] FOR 0.15% STRENGTH           -    **DEXTROSE 50% SYRINGE (DEXTROSE 50%)** 12.5 G Intravenous PRN BG < 60 AND CAN NOT TAKE PO  for 60 Days           -   **GLUCAGON** 1 MG Intramuscular PRN BG < 60 AND NO IV ACCESS for 60 Days, Clinician Dir:MAY BE GIVEN IM OR SUB-Q           -   **BISACODYL (DULCOLAX)** 10 MG Rectal QDAYPRN CONSTIPATION for 60 Days           -   **HEPARIN** 5000 UNITS Subcutaneous Q12H for 60 Days           -   **IPRATROPIUM/ALBUTEROL SULFATE (DUONEB)** 3 ML Inhalation Q4HPRN SOB/WHEEZING for 60 Days           -   **POLYETHYLENE GLYCOL (MIRALAX)** 17 G Oral QDAYPRN CONSTIPATION for 60 Days           -   **AMMONIUM LACTATE 12% LOTION (LAC-HYDRIN 12%)** 1 APP Topical QDAYPRN RASH for 60 Days           -   **ALBUTEROL INHALER (VENTOLIN HFA)** 2 PUFF Inhalation Q4HPRN SHORTNESS OF BREATH for 60 Days           -   **ACETAMINOPHEN (TYLENOL)** 650 MG Oral Q6HPRN PAIN for 60 Days           -   **BENZONATATE** 100 MG Oral TIDPRN COUGH for 60 Days           Comment Patient is a 54 y/o male with history of HFrEF (EF 30% 05/2019), COPD  not on home O2, HTN, DM, and, Schizoaffective disorder presenting from a group  home for shortness of breath.        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 History of previous admissions for CHF  exacerbation requiring IV Diuresis. Previous dry weight of 75kg with most  recent weight 81kg in the ED. Etiology of exacerbation unclear. Worsening  heart failure 2/2 viral infection vs. recent MI vs. medication non-compliance.  Lives in a group home, will reach out to determine patient adherence to  medication regimen.    --- PRELOAD: Fluid overloaded on exam with lower extremity edema and CXR  notable for increased pulmonary vascular congestion with left pleural effusion    -- 80mg  IV Lasix this AM    ---> Will follow-up PM diuresis to adjust as needed    -- strict I&O     -- daily standing weights        --PUMP-- HFrEF (LVEF 30%)    -- Inpatient TTE    -- Holding home Metoprolol        --- CORONARIES--    -- ASA and Atorvastatin 80mg         --- Neurohormonal --- Previously discontinued Spironolactone for hyperkalemia  in the setting of K-sparing diuretics    -- Can consider Spironolactone for GDMT        #Acute COPD Exacerbation - PFTS (07/21/19) FEV1 24% with FEV1/FVC 40% GOLD IV.    -- Prednisone 40mg  qd 5 days    -- Azithromycin 500mg  IV Q24H    -- SSA/SSB, RF, Anti-CCP serologies per previous Pulm note    -- Duonebs Q4HPRN        #AKI    Cr 1.85 on admission, baseline Cr possibly ~1.2-1.3, but unknown if new Cr  baseline is 1.85. AKI possibly due to cardiorenal in the setting of CHF  exacerbation vs. new baseline. Will trend and follow-up diuretics response.    - Diuresis as above        #Anemia    #Polycythemia    Hgb 18.2 and Hct 59.9 possibly 2/2 COPD vs possible OSA. Can consider empiric  treatment of OSA with trial of CPAP if patient desaturates overnight. Iron  studies significant for Iron Deficiency anemia Iron 47, %Fe Saturation 12,  Ferritin 68, Transferrin 270.    - IV Venofer iso heart failure    - Consider outpatient workup (JAK2 testing and BM)    - EPO level    - Lower Extremity Ultrasound -- Right > Left lower extremity edema iso  polycythemia    - outpatient sleep study            #Constipation    Pt has hx of constipation and reports constipation on day of admission for the  past several days    - Senna BID    - cont home miralax and colace        #DM    On Metformin outpatient.    - A1c Pending    - 10u Lantus and 4u Lispro    - SSI TIDWM    - hold home metformin        #Schizoaffective disorder    - Carbamazepine 400 mg qam and 600 mg qhs    - Mirtazapine 15 mg qhs    - Zyprexa 10 mg qhs    - Seroquel 300 mg qhs        #HTN    #HLD: Cont Home Atorvastatin        #Social: Pt lives in a group home        Full code (confirmed)    HH Diet    SQ heparin.             **Electronically signed by Laurelyn Sickle, MD on 01/11/2020 11:18**        **Electronically signed by Laurelyn Sickle, MD on 01/11/2020 11:18**

## 2020-01-10 NOTE — Progress Notes (Signed)
 Supervisory Note For Resident I performed a history and physical examination  of the patient and discussed the management with the resident. I reviewed the  resident's note and agree with the documented findings and plan of care. Yes,    Additional Notes        Gen Med Attending Note        Patient seen and examined. No overnight events, patient feels well today. On  exam, vital signs T 36.5 HR 88 BP 113/71 RR 18 O2 sat 94% on room air.  General: In no apparent distress, pleasant, cardiovascular: RRR, normal S1-S2,  no M/R/G, pulmonary: Clear to auscultation bilaterally, no W/R/C, extremities:  2+ pitting edema bilaterally with hyperpigmentation, labs: Sodium 140  potassium 4.4 chloride 107 bicarb 24 BUN 30 creatinine 1.44 glucose 163 WBC  7.6 hemoglobin 16.3 platelet 185        A/P: 54 year old man with history of HFrEF (EF = 20-25%), COPD, hypertension,  diabetes, schizoaffective disorder who was admitted with dyspnea on exertion  in the setting of acute systolic heart failure exacerbation and COPD  exacerbation, found to have 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    Supervisory Note For Resident I performed a history and physical examination  of the patient and discussed the management with the resident. I reviewed the  resident's note and agree with the documented findings and plan of care. Yes,     Additional Notes        Gen Med Attending Note        Patient seen and examined. No overnight events, patient feels well today. On  exam, vital signs T 36.5 HR 88 BP 113/71 RR 18 O2 sat 94% on room air.  General: In no apparent distress, pleasant, cardiovascular: RRR, normal S1-S2,  no M/R/G, pulmonary: Clear to auscultation bilaterally, no W/R/C, extremities:  2+ pitting edema bilaterally with hyperpigmentation, labs: Sodium 140  potassium 4.4 chloride 107 bicarb 24 BUN 30 creatinine 1.44 glucose 163 WBC  7.6 hemoglobin 16.3 platelet 185        A/P: 54 year old man with history of HFrEF (EF = 20-25%), COPD, hypertension,  diabetes, schizoaffective disorder who was admitted with dyspnea on exertion  in the setting of acute systolic heart failure exacerbation and COPD  exacerbation, found to have 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            **Electronically signed by Clearence Cheek, MD on 01/20/2020 14:52**        **Electronically signed by Clearence Cheek, MD on 01/20/2020 14:52**

## 2020-01-10 NOTE — Progress Notes (Signed)
 **Heart Failure (Initial Note)**    Reason for Admission Medication Non-Adherence Medication Non-adherence reason  Not understanding how to take; Cognitive Barriers Cognitive Barriers Inability  to understand.    Outpatient Cardiologist: Dr. Suzanna Obey [facility]).    PCP: Berenice Primas, NP.    LVEF (Last Doc-Prior Visit) 30% (05/2019).    Heart Failure Type (If Known): iCMP.      Last Updated: 01/10/2020 20:23  (Complete)  By: Eula Fried, RPH    Active              -   **aspirin**  (Aspirin Low Dose) 81 mg tablet,delayed release (DR/EC)  Directions:  1 tablet oral daily  (Active)          -   **atorvastatin**  (Lipitor) 20 mg Tablet  Directions:  1 tablet oral daily at bedtime  (Active)          -   **brimonidine**  (Alphagan P) 0.15 % Drops  Directions:  1 drop ophthalmic three times a day  (Active)          -   **carBAMazepine**  (Epitol) 200 mg Tablet  Directions:  2 tablet oral daily every morning  (Active)          -   **carBAMazepine**  (Epitol) 200 mg Tablet  Directions:  3 tablet oral daily every evening  (Active)          -   **chlorthalidone** 25 mg Tablet  Directions:  1 tablet oral daily  (Active)          -   **docusate sodium**  (Stool Softener) 100 mg Capsule  Directions:  1 capsule oral twice a day  (Active)          -   **dorzolamide** 2 % Drops  Directions:  1 drop ophthalmic, both eyes twice a day  (Active)          -   **fluticasone-umeclidin-vilanter**  (Trelegy Ellipta) 100 mcg-62.5 mcg-25 mcg/actuation blister with device  Directions:  2 puff by inhalation daily every morning  (Active)          -   **furosemide** 40 mg Tablet  Directions:  1 tablet oral daily  (Active)          -   **metFORMIN** 1,000 mg Tablet  Directions:  1 tablet oral twice a day  (Active)          -   **metoprolol succinate** 50 mg Tablet Extended Release 24 hr  Directions:  1.5 tablet oral daily  (Active)          -   **mirtazapine**  (Remeron) 15 mg Tablet  Directions:  1 tablet oral daily  at bedtime  (Active)          -   **OLANZapine**  (ZyPREXA) 10 mg Tablet  Directions:  1 tablet oral daily at bedtime  (Active)          -   **polyethylene glycol 3350** 17 gram Powder in Packet  Directions:  1 each oral daily  (Active)          -   **QUEtiapine** 400 mg Tablet  Directions:  1 tablet oral daily at bedtime  (Active)          -   **sennosides** 8.6 mg Tablet  Directions:  2 tablet oral twice a day  (Active)      Active PRN              -   **  albuterol sulfate**  (ProAir HFA) 90 mcg HFA Aerosol Inhaler  Directions:  2 puff by inhalation every four hours PRN shortness of breath  (Active)          -   **ammonium lactate**  (Lac-Hydrin Five) 5 % Lotion  Directions:  1 application topical daily PRN rash  (Active)          -   **bisacodyl** 10 mg Suppository  Directions:  1 suppository per rectum every seventy two hours PRN constipation  (Active)          -   **hydrocortisone** 2.5 % Cream  Directions:  1 application topical twice a day PRN rash  (Active)            Medication              -   **IRON SUCROSE COMPLEX (VENOFER)** 200 MG Intravenous QDAY for 2 Doses, Clinician Dir:ON DAYS 6 AND 7.   Pending Date: 01/16/2020          -   **HYDRALAZINE (APRESOLINE)** 25 MG Oral TID for 60 Days           -   **FUROSEMIDE (LASIX)** 120 MG Intravenous ONE TIME for 1 Doses           -   **REMOVE PATCH** 1 PATCH Miscellaneous QDAY for 60 Days, Clinician Dir:OLD NICOTINE PATCH           -   **IRON SUCROSE COMPLEX (VENOFER)** 200 MG Intravenous QDAY First Dose Now for 3 Doses, Clinician Dir:ON DAYS 1, 2 AND 3.           -   **NICOTINE TRANSDERMAL (HABITROL TRANSDERMAL)** 21 MG Topical QDAY First Dose Now for 60 Days           -   **PATCH INTACT QSHIFT (PATCH INTACT INSPECTION)** 1 PATCH Topical CHECK for 60 Days, Clinician JJO:ACZYSAY (NICOTINE) PATCH EVERY SHIFT           -   **ASPIRIN CHEWABLE** 81 MG Oral QDAY for 60 Days           -   **CARBAMAZEPINE (TEGRETOL)** 400 MG Oral QAM  for 60 Days           -   ** ** ** **INSULIN LISPRO (HUMALOG) (HUMALOG)******** Sliding Scale Subcutaneous TIDWM for 60 Days      For Glucose 80 to 150 Give 0 Units    For Glucose 151 to 200 Give 2 Units    For Glucose 201 to 250 Give 3 Units    For Glucose 251 to 300 Give 4 Units    Notify Physician if: Call HO for glucose > 300          -   **IPRATROPIUM 0.02% INH (ATROVENT 0.02%)** 0.25 MG Inhalation Q6H for 60 Days, Clinician TKZ:[id number] FOR TRELEGY           -   **CARBAMAZEPINE (TEGRETOL)** 600 MG Oral QHS for 60 Days           -   **DOCUSATE SODIUM (COLACE)** 100 MG Oral BID for 60 Days           -   **ATORVASTATIN (LIPITOR)** 20 MG Oral QHS for 60 Days           -   **QUETIAPINE FUMARATE (SEROQUEL)** 400 MG Oral QHS for 60 Days           -   **DORZOLAMIDE 2% OPHTH (TRUSOPT 2% OPHTH)** 1 DROP Both  Eyes SOL BID for 60 Days           -   **OLANZAPINE (ZYPREXA)** 10 MG Oral QHS for 60 Days           -   **MIRTAZAPINE (REMERON)** 15 MG Oral QHS for 60 Days           -   **HYDROCORTISONE 1% CREAM** 1 APP Topical BID for 60 Days, Clinician Dir:SEE EXTENDED INSTRUCTIONS INTERCHANGE FOR 2.5%           -   **SENNA (SENOKOT)** 8.6 MG Oral BID for 60 Days           -   **FORMOTEROL FUMARATE (PERFOROMIST)** 20 MCG Inhalation BID for 60 Days, Clinician ZOX:[id number] FOR TRELEGY SEE ORDER #31           -   **BUDESONIDE INHALATION (PULMICORT RESPULES)** 0.25 MG Inhalation BID for 60 Days, Clinician XBJ:[id number] FOR TRELEGY SEE ORDER #31           -   **BRIMONIDINE 0.2% OPHTH (ALPHAGAN 0.2% OPHTH)** 1 DROP Both Eyes SOL BID for 60 Days, Clinician QMV:[id number] FOR 0.15% STRENGTH           -   **GLUCAGON** 1 MG Intramuscular PRN BG < 60 AND NO IV ACCESS for 60 Days, Clinician Dir:MAY BE GIVEN IM OR SUB-Q           -   **DEXTROSE 50% SYRINGE (DEXTROSE 50%)** 12.5 G Intravenous PRN BG < 60 AND CAN NOT TAKE PO for 60 Days           -   **BISACODYL (DULCOLAX)** 10 MG Rectal QDAYPRN  CONSTIPATION for 60 Days           -   **HEPARIN** 5000 UNITS Subcutaneous Q12H for 60 Days           -   **IPRATROPIUM/ALBUTEROL SULFATE (DUONEB)** 3 ML Inhalation Q4HPRN SOB/WHEEZING for 60 Days           -   **BENZONATATE** 100 MG Oral TIDPRN COUGH for 60 Days           -   **AMMONIUM LACTATE 12% LOTION (LAC-HYDRIN 12%)** 1 APP Topical QDAYPRN RASH for 60 Days           -   **ALBUTEROL INHALER (VENTOLIN HFA)** 2 PUFF Inhalation Q4HPRN SHORTNESS OF BREATH for 60 Days           -   **ACETAMINOPHEN (TYLENOL)** 650 MG Oral Q6HPRN PAIN for 60 Days           -   **POLYETHYLENE GLYCOL (MIRALAX)** 17 G Oral QDAYPRN CONSTIPATION for 60 Days         **Heart Failure (Initial Note)**    Reason for Admission Medication Non-Adherence Medication Non-adherence reason  Not understanding how to take; Cognitive Barriers Cognitive Barriers Inability  to understand.    Outpatient Cardiologist: Dr. Suzanna Obey [facility]).    PCP: Berenice Primas, NP.    LVEF (Last Doc-Prior Visit) 30% (05/2019).    Heart Failure Type (If Known): iCMP.      Last Updated: 01/10/2020 20:23  (Complete)  By: Eula Fried, RPH    Active              -   **aspirin**  (Aspirin Low Dose) 81 mg tablet,delayed release (DR/EC)  Directions:  1 tablet oral daily  (Active)          -   **atorvastatin**  (Lipitor) 20 mg Tablet  Directions:  1 tablet oral daily at bedtime  (Active)          -   **brimonidine**  (Alphagan P) 0.15 % Drops  Directions:  1 drop ophthalmic three times a day  (Active)          -   **carBAMazepine**  (Epitol) 200 mg Tablet  Directions:  2 tablet oral daily every morning  (Active)          -   **carBAMazepine**  (Epitol) 200 mg Tablet  Directions:  3 tablet oral daily every evening  (Active)          -   **chlorthalidone** 25 mg Tablet  Directions:  1 tablet oral daily  (Active)          -   **docusate sodium**  (Stool Softener) 100 mg Capsule  Directions:  1 capsule oral twice a day  (Active)          -    **dorzolamide** 2 % Drops  Directions:  1 drop ophthalmic, both eyes twice a day  (Active)          -   **fluticasone-umeclidin-vilanter**  (Trelegy Ellipta) 100 mcg-62.5 mcg-25 mcg/actuation blister with device  Directions:  2 puff by inhalation daily every morning  (Active)          -   **furosemide** 40 mg Tablet  Directions:  1 tablet oral daily  (Active)          -   **metFORMIN** 1,000 mg Tablet  Directions:  1 tablet oral twice a day  (Active)          -   **metoprolol succinate** 50 mg Tablet Extended Release 24 hr  Directions:  1.5 tablet oral daily  (Active)          -   **mirtazapine**  (Remeron) 15 mg Tablet  Directions:  1 tablet oral daily at bedtime  (Active)          -   **OLANZapine**  (ZyPREXA) 10 mg Tablet  Directions:  1 tablet oral daily at bedtime  (Active)          -   **polyethylene glycol 3350** 17 gram Powder in Packet  Directions:  1 each oral daily  (Active)          -   **QUEtiapine** 400 mg Tablet  Directions:  1 tablet oral daily at bedtime  (Active)          -   **sennosides** 8.6 mg Tablet  Directions:  2 tablet oral twice a day  (Active)      Active PRN              -   **albuterol sulfate**  (ProAir HFA) 90 mcg HFA Aerosol Inhaler  Directions:  2 puff by inhalation every four hours PRN shortness of breath  (Active)          -   **ammonium lactate**  (Lac-Hydrin Five) 5 % Lotion  Directions:  1 application topical daily PRN rash  (Active)          -   **bisacodyl** 10 mg Suppository  Directions:  1 suppository per rectum every seventy two hours PRN constipation  (Active)          -   **hydrocortisone** 2.5 % Cream  Directions:  1 application topical twice a day PRN rash  (Active)            Medication              -   **  IRON SUCROSE COMPLEX (VENOFER)** 200 MG Intravenous QDAY for 2 Doses, Clinician Dir:ON DAYS 6 AND 7.   Pending Date: 01/16/2020          -   **HYDRALAZINE (APRESOLINE)** 25 MG Oral TID for 60 Days           -   **FUROSEMIDE (LASIX)**  120 MG Intravenous ONE TIME for 1 Doses           -   **REMOVE PATCH** 1 PATCH Miscellaneous QDAY for 60 Days, Clinician Dir:OLD NICOTINE PATCH           -   **IRON SUCROSE COMPLEX (VENOFER)** 200 MG Intravenous QDAY First Dose Now for 3 Doses, Clinician Dir:ON DAYS 1, 2 AND 3.           -   **NICOTINE TRANSDERMAL (HABITROL TRANSDERMAL)** 21 MG Topical QDAY First Dose Now for 60 Days           -   **PATCH INTACT QSHIFT (PATCH INTACT INSPECTION)** 1 PATCH Topical CHECK for 60 Days, Clinician WGN:[id number] (NICOTINE) PATCH EVERY SHIFT           -   **ASPIRIN CHEWABLE** 81 MG Oral QDAY for 60 Days           -   **CARBAMAZEPINE (TEGRETOL)** 400 MG Oral QAM for 60 Days           -   ** ** ** **INSULIN LISPRO (HUMALOG) (HUMALOG)******** Sliding Scale Subcutaneous TIDWM for 60 Days      For Glucose 80 to 150 Give 0 Units    For Glucose 151 to 200 Give 2 Units    For Glucose 201 to 250 Give 3 Units    For Glucose 251 to 300 Give 4 Units    Notify Physician if: Call HO for glucose > 300          -   **IPRATROPIUM 0.02% INH (ATROVENT 0.02%)** 0.25 MG Inhalation Q6H for 60 Days, Clinician MVH:[id number] FOR TRELEGY           -   **CARBAMAZEPINE (TEGRETOL)** 600 MG Oral QHS for 60 Days           -   **DOCUSATE SODIUM (COLACE)** 100 MG Oral BID for 60 Days           -   **ATORVASTATIN (LIPITOR)** 20 MG Oral QHS for 60 Days           -   **QUETIAPINE FUMARATE (SEROQUEL)** 400 MG Oral QHS for 60 Days           -   **DORZOLAMIDE 2% OPHTH (TRUSOPT 2% OPHTH)** 1 DROP Both Eyes SOL BID for 60 Days           -   **OLANZAPINE (ZYPREXA)** 10 MG Oral QHS for 60 Days           -   **MIRTAZAPINE (REMERON)** 15 MG Oral QHS for 60 Days           -   **HYDROCORTISONE 1% CREAM** 1 APP Topical BID for 60 Days, Clinician Dir:SEE EXTENDED INSTRUCTIONS INTERCHANGE FOR 2.5%           -   **SENNA (SENOKOT)** 8.6 MG Oral BID for 60 Days           -   **FORMOTEROL FUMARATE (PERFOROMIST)** 20 MCG Inhalation BID for 60  Days, Clinician XLK:[id number] FOR TRELEGY SEE ORDER #31           -   **  BUDESONIDE INHALATION (PULMICORT RESPULES)** 0.25 MG Inhalation BID for 60 Days, Clinician ZDG:[id number] FOR TRELEGY SEE ORDER #31           -   **BRIMONIDINE 0.2% OPHTH (ALPHAGAN 0.2% OPHTH)** 1 DROP Both Eyes SOL BID for 60 Days, Clinician DGL:[id number] FOR 0.15% STRENGTH           -   **GLUCAGON** 1 MG Intramuscular PRN BG < 60 AND NO IV ACCESS for 60 Days, Clinician Dir:MAY BE GIVEN IM OR SUB-Q           -   **DEXTROSE 50% SYRINGE (DEXTROSE 50%)** 12.5 G Intravenous PRN BG < 60 AND CAN NOT TAKE PO for 60 Days           -   **BISACODYL (DULCOLAX)** 10 MG Rectal QDAYPRN CONSTIPATION for 60 Days           -   **HEPARIN** 5000 UNITS Subcutaneous Q12H for 60 Days           -   **IPRATROPIUM/ALBUTEROL SULFATE (DUONEB)** 3 ML Inhalation Q4HPRN SOB/WHEEZING for 60 Days           -   **BENZONATATE** 100 MG Oral TIDPRN COUGH for 60 Days           -   **AMMONIUM LACTATE 12% LOTION (LAC-HYDRIN 12%)** 1 APP Topical QDAYPRN RASH for 60 Days           -   **ALBUTEROL INHALER (VENTOLIN HFA)** 2 PUFF Inhalation Q4HPRN SHORTNESS OF BREATH for 60 Days           -   **ACETAMINOPHEN (TYLENOL)** 650 MG Oral Q6HPRN PAIN for 60 Days           -   **POLYETHYLENE GLYCOL (MIRALAX)** 17 G Oral QDAYPRN CONSTIPATION for 60 Days         **Vital Signs**    Vital Signs    01/12/2020 11:12              -  O2 Amount: 2.0 LPM          -  Morse Fall Risk Total: 35      01/12/2020 11:04              -  Temperature: 37 (35-37.8Cel)          -  Site: Oral          -  Heart Rate: 101H (60-90)          -  Site: Monitor          -  BP: 114/77 (90-140/60-90)          -  Site: Right Arm          -  Position: Sitting          -  Method: Automated          -  Respirations: 18 (12-20)          -  O2 Saturation (%): 94          -  O2 Delivery Method: Nasal Cannula          -  MEWS Vital Sign Score: 1      01/11/2020 06:34               -  How Obtained: Stand. Scale          -  Weight: 85.5kg      CFS Vital Signs CS    01/12/2020 22:59              -  Shift Intake Total:          -  Shift Output Total: 0ml          -  Shift Balance:        **Vital Signs**    Vital Signs    01/12/2020 11:12              -  O2 Amount: 2.0 LPM          -  Morse Fall Risk Total: 35      01/12/2020 11:04              -  Temperature: 37 (35-37.8Cel)          -  Site: Oral          -  Heart Rate: 101H (60-90)          -  Site: Monitor          -  BP: 114/77 (90-140/60-90)          -  Site: Right Arm          -  Position: Sitting          -  Method: Automated          -  Respirations: 18 (12-20)          -  O2 Saturation (%): 94          -  O2 Delivery Method: Nasal Cannula          -  MEWS Vital Sign Score: 1      01/11/2020 06:34              -  How Obtained: Stand. Scale          -  Weight: 85.5kg      CFS Vital Signs CS    01/12/2020 22:59              -  Shift Intake Total:          -  Shift Output Total: 0ml          -  Shift Balance:        **Assessment and Plan**    Assessment and Plan (Heart Failure)    Patient is a 54 y/o male with history of HFrEF (EF 30% 05/2019), COPD not on  home O2, HTN, DM, and, Schizoaffective disorder presenting from a group home  for shortness of breath i/s/o acute on chronic heart failure.        **Last Discharge Weight:** 74.4kg    **Admitting Weight:** 85.5        The patient was seen given his multiple comorbidities and risk for  readmission. Provided "Caring For Your Heart: Living Well with Heart Failure"  materials in [place] Reviewed daily weight tracking, low sodium diet,  medication adherence, HF zones/symptom reporting. He reports medication  adherence. I spoke with Daryl Eastern, his group home manager [phone]), who pre-  fills his pill box each week. The group home staff provides him with his PM  medications, but patient refuses to have them present to  watch him physically  take pills and Daryl Eastern thinks he may be throwing them away. No staff are present  for AM pills- we had previously requested that morning meds be re-timed for  noon when member of group home staff arrives, however, if patient does not  allow staff to physically administer this would not be solution to adherence  challenges. He  has been non-adherent with other therapies including taking his  inhalers, blood sugar monitoring, and weight tracking. He will benefit from  close follow-up and reinforcement of education.        **Recommendations**    -IV diuresis per team- patient volume overloaded, weight up 11kg since last discharge weight likely in setting of medication non-adherence.    -Losartan previously held for hyperkalemia. Would restart 12.5mg  daily with close monitoring of K+/renal function/BP    -Ok to restart beta blocker    -T2DM: HgbA1C 7.5%. Current regimen metformin 1,000mg  BID. Would benefit from starting an SGLT2i for both his DM and HF, however, given difficulties with med adherence would defer to outpatient setting for now.     -Discussed with patient that he may be eligible for an ICD given his persistently low EF, however, he is not interested. Reviewed risks of cardiac arrhythmias including sudden cardiac death.    -Recommend outpatient sleep study to evaluate for OSA    -Daily standing weights        **Follow up:**      Please include discharge weight on paperwork.    **Please provide clear instructions, as appropriate, to:**    **    Check daily weights **    **    For managing increases in weight (call [facility] [phone] for  weight gain of 3lb in a day or 5lb in a week)**    **    Diet with fluid restriction (64 ounces)**    Patient would benefit from **post-discharge heart failure clinic with Catha Nottingham.**   **Appointment 9/10 at 12:15pm.**     Would benefit from VNA (telemonitoring program)- paged case manager to  request referral.    May benefit from outpatient  cardiac rehab if agreeable.        Would recommend **warm hand-off with primary care provider Amy Edyth Gunnels.**        Please contact me with any questions/concerns.    P: [phone]    B: [phone]    If I am not available, may contact Catha Nottingham, NP [phone]        **Assessment and Plan**    Assessment and Plan (Heart Failure)    Patient is a 54 y/o male with history of HFrEF (EF 30% 05/2019), COPD not on  home O2, HTN, DM, and, Schizoaffective disorder presenting from a group home  for shortness of breath i/s/o acute on chronic heart failure.        **Last Discharge Weight:** 74.4kg    **Admitting Weight:** 85.5        The patient was seen given his multiple comorbidities and risk for  readmission. Provided "Caring For Your Heart: Living Well with Heart Failure"  materials in [place] Reviewed daily weight tracking, low sodium diet,  medication adherence, HF zones/symptom reporting. He reports medication  adherence. I spoke with Daryl Eastern, his group home manager [phone]), who pre-  fills his pill box each week. The group home staff provides him with his PM  medications, but patient refuses to have them present to watch him physically  take pills and Daryl Eastern thinks he may be throwing them away. No staff are present  for AM pills- we had previously requested that morning meds be re-timed for  noon when member of group home staff arrives, however, if patient does not  allow staff to physically administer this would not be solution to adherence  challenges. He has been non-adherent with other therapies including taking his  inhalers, blood sugar monitoring, and weight tracking. He will benefit from  close follow-up and reinforcement of education.        **Recommendations**    -IV diuresis per team- patient volume overloaded, weight up 11kg since last discharge weight likely in setting of medication non-adherence.    -Losartan previously held for hyperkalemia. Would restart 12.5mg  daily with close monitoring of  K+/renal function/BP    -Ok to restart beta blocker    -T2DM: HgbA1C 7.5%. Current regimen metformin 1,000mg  BID. Would benefit from starting an SGLT2i for both his DM and HF, however, given difficulties with med adherence would defer to outpatient setting for now.     -Discussed with patient that he may be eligible for an ICD given his persistently low EF, however, he is not interested. Reviewed risks of cardiac arrhythmias including sudden cardiac death.    -Recommend outpatient sleep study to evaluate for OSA    -Daily standing weights        **Follow up:**      Please include discharge weight on paperwork.    **Please provide clear instructions, as appropriate, to:**    **    Check daily weights **    **    For managing increases in weight (call [facility] [phone] for  weight gain of 3lb in a day or 5lb in a week)**    **    Diet with fluid restriction (64 ounces)**    Patient would benefit from **post-discharge heart failure clinic with Catha Nottingham.**   **Appointment 9/10 at 12:15pm.**     Would benefit from VNA (telemonitoring program)- paged case manager to  request referral.    May benefit from outpatient cardiac rehab if agreeable.        Would recommend **warm hand-off with primary care provider Amy Edyth Gunnels.**        Please contact me with any questions/concerns.    P: [phone]    B: [phone]    If I am not available, may contact Catha Nottingham, NP [phone]        **Heart Failure Labs**    PDoc Labs Heart Failure    01/12/2020 13:14              -  Creatinine (CR): 1.86 H          -  Sodium (NA): 143       01/11/2020 04:32              -  Ferritin: 68           Labs (Heart Failure) BNP (HF) 1085, HgbA1C (HF) 7.5%.    **Heart Failure Labs**    PDoc Labs Heart Failure    01/12/2020 13:14              -  Creatinine (CR): 1.86 H          -  Sodium (NA): 143       01/11/2020 04:32              -  Ferritin: 68           Labs (Heart Failure) BNP (HF) 1085, HgbA1C (HF) 7.5%.     **Heart Failure Education**    Was the Living Well with Heart Failure booklet reviewed with the  patient/family using teach back? Yes.    Was the Living Well with Heart Failure Zones/Action Plan reviewed with the  patient/family using teach back Yes.    **Heart Failure Education**  Was the Living Well with Heart Failure booklet reviewed with the  patient/family using teach back? Yes.    Was the Living Well with Heart Failure Zones/Action Plan reviewed with the  patient/family using teach back Yes.            **Electronically signed by Mena Goes, NP on 01/29/2020 12:59**        **In Progress, created by Mena Goes, NP on 01/12/2020 16:42**

## 2020-01-10 NOTE — Progress Notes (Signed)
 **Subjective**    Subjective Overnight:    - No acute events        This AM:    - Continues to have tremors in lower extremities, but reports chronic    - Reports feels improved from breathing standpoint and LE edema with diuresis    - Denies chest pain, nausea, vomiting, diarrhea, and headache.    Tolerating Diet Yes.    **Subjective**    Subjective Overnight:    - No acute events        This AM:    - Continues to have tremors in lower extremities, but reports chronic    - Reports feels improved from breathing standpoint and LE edema with diuresis    - Denies chest pain, nausea, vomiting, diarrhea, and headache.    Tolerating Diet Yes.    **ROS**    Complete Review of Systems All other systems reviewed and negative except as  noted in the HPI.    **ROS**    Complete Review of Systems All other systems reviewed and negative except as  noted in the HPI.    **Exam**    Comment Gen: Well appearing in NAD, wearing nasal cannula comfortably    HEENT: MMM, PERRL, EOMI, unable to evaluate for JVD given body habitus    CV: RRR, no murmurs, rubs, gallops    Lung: overall diminished breath sounds, improved rales and bilateral crackles,  now only in bases    Abd: Soft, Nontender, no rebound, no guarding +Distended, +BS    Ext: WWP +Bilateral lower extremity edema to the shins Right > Left, decreased  from prior but still 2+    Neuro: no focal neurologic deficits; AOx3 + intermittent tremor of upper/lower  extremities +5/5 Extremity strengths.      Vital Signs    01/13/2020 11:19              -  Temperature: 36.9 (35-37.8Cel)          -  Site: Oral          -  Heart Rate: 104H (60-90)          -  Site: Monitor          -  BP: 121/74 (90-140/60-90)          -  Site: Left Arm          -  Position: Lying          -  Method: Automated          -  Respirations: 18 (12-20)          -  O2 Saturation (%): 92          -  O2 Amount: 2.0 LPM          -  O2 Delivery Method: Nasal Cannula          -  MEWS Vital  Sign Score: 1      01/12/2020 20:10              -  Morse Fall Risk Total: 60      CFS Vital Signs CS    01/13/2020 14:59              -  Shift Intake Total:          -  Shift Output Total:          -  Shift Balance: -        **Exam**  Comment Gen: Well appearing in NAD, wearing nasal cannula comfortably    HEENT: MMM, PERRL, EOMI, unable to evaluate for JVD given body habitus    CV: RRR, no murmurs, rubs, gallops    Lung: overall diminished breath sounds, improved rales and bilateral crackles,  now only in bases    Abd: Soft, Nontender, no rebound, no guarding +Distended, +BS    Ext: WWP +Bilateral lower extremity edema to the shins Right > Left, decreased  from prior but still 2+    Neuro: no focal neurologic deficits; AOx3 + intermittent tremor of upper/lower  extremities +5/5 Extremity strengths.      Vital Signs    01/13/2020 11:19              -  Temperature: 36.9 (35-37.8Cel)          -  Site: Oral          -  Heart Rate: 104H (60-90)          -  Site: Monitor          -  BP: 121/74 (90-140/60-90)          -  Site: Left Arm          -  Position: Lying          -  Method: Automated          -  Respirations: 18 (12-20)          -  O2 Saturation (%): 92          -  O2 Amount: 2.0 LPM          -  O2 Delivery Method: Nasal Cannula          -  MEWS Vital Sign Score: 1      01/12/2020 20:10              -  Lattie Corns Fall Risk Total: 60      CFS Vital Signs CS    01/13/2020 14:59              -  Shift Intake Total:          -  Shift Output Total:          -  Shift Balance: -        **Assessment and Plan**    Medication              -   **IRON SUCROSE COMPLEX (VENOFER)** 200 MG Intravenous QDAY for 2 Doses, Clinician Dir:ON DAYS 6 AND 7.   Pending Date: 01/16/2020          -   **HYDRALAZINE (APRESOLINE)** 50 MG Oral TID for 60 Days   Pending Date: 01/13/2020          -   **METOPROLOL (LOPRESSOR)** 12.5 MG Oral BID First Dose Now for 60  Days           -   **FUROSEMIDE (LASIX)** 100 MG Intravenous ONE TIME for 1 Doses           -   **ACETAZOLAMIDE (DIAMOX)** 250 MG Intravenous ONE TIME for 1 Doses           -   **REMOVE PATCH** 1 PATCH Miscellaneous QDAY for 60 Days, Clinician Dir:OLD NICOTINE PATCH           -   **PATCH INTACT QSHIFT (PATCH INTACT INSPECTION)** 1 PATCH Topical CHECK for 60 Days, Clinician ZOX:WRUEAVW (NICOTINE) PATCH  EVERY SHIFT           -   **NICOTINE TRANSDERMAL (HABITROL TRANSDERMAL)** 21 MG Topical QDAY First Dose Now for 60 Days           -   **CARBAMAZEPINE (TEGRETOL)** 400 MG Oral QAM for 60 Days           -   **ASPIRIN CHEWABLE** 81 MG Oral QDAY for 60 Days           -   ** ** ** **INSULIN LISPRO (HUMALOG) (HUMALOG)******** Sliding Scale Subcutaneous TIDWM for 60 Days      For Glucose 80 to 150 Give 0 Units    For Glucose 151 to 200 Give 2 Units    For Glucose 201 to 250 Give 3 Units    For Glucose 251 to 300 Give 4 Units    Notify Physician if: Call HO for glucose > 300          -   **IPRATROPIUM 0.02% INH (ATROVENT 0.02%)** 0.25 MG Inhalation Q6H for 60 Days, Clinician GMW:[id number] FOR TRELEGY           -   **ATORVASTATIN (LIPITOR)** 20 MG Oral QHS for 60 Days           -   **CARBAMAZEPINE (TEGRETOL)** 600 MG Oral QHS for 60 Days           -   **QUETIAPINE FUMARATE (SEROQUEL)** 400 MG Oral QHS for 60 Days           -   **DORZOLAMIDE 2% OPHTH (TRUSOPT 2% OPHTH)** 1 DROP Both Eyes SOL BID for 60 Days           -   **DOCUSATE SODIUM (COLACE)** 100 MG Oral BID for 60 Days           -   **OLANZAPINE (ZYPREXA)** 10 MG Oral QHS for 60 Days           -   **HYDROCORTISONE 1% CREAM** 1 APP Topical BID for 60 Days, Clinician Dir:SEE EXTENDED INSTRUCTIONS INTERCHANGE FOR 2.5%           -   **MIRTAZAPINE (REMERON)** 15 MG Oral QHS for 60 Days           -   **FORMOTEROL FUMARATE (PERFOROMIST)** 20 MCG Inhalation BID for 60 Days, Clinician IHK:[id number] FOR TRELEGY SEE ORDER #31           -    **BUDESONIDE INHALATION (PULMICORT RESPULES)** 0.25 MG Inhalation BID for 60 Days, Clinician IEP:[id number] FOR TRELEGY SEE ORDER #31           -   **BRIMONIDINE 0.2% OPHTH (ALPHAGAN 0.2% OPHTH)** 1 DROP Both Eyes SOL BID for 60 Days, Clinician AYT:[id number] FOR 0.15% STRENGTH           -   **SENNA (SENOKOT)** 8.6 MG Oral BID for 60 Days           -   **DEXTROSE 50% SYRINGE (DEXTROSE 50%)** 12.5 G Intravenous PRN BG < 60 AND CAN NOT TAKE PO for 60 Days           -   **GLUCAGON** 1 MG Intramuscular PRN BG < 60 AND NO IV ACCESS for 60 Days, Clinician Dir:MAY BE GIVEN IM OR SUB-Q           -   **BISACODYL (DULCOLAX)** 10 MG Rectal QDAYPRN CONSTIPATION for 60 Days           -   **HEPARIN**  5000 UNITS Subcutaneous Q12H for 60 Days           -   **IPRATROPIUM/ALBUTEROL SULFATE (DUONEB)** 3 ML Inhalation Q4HPRN SOB/WHEEZING for 60 Days           -   **POLYETHYLENE GLYCOL (MIRALAX)** 17 G Oral QDAYPRN CONSTIPATION for 60 Days           -   **AMMONIUM LACTATE 12% LOTION (LAC-HYDRIN 12%)** 1 APP Topical QDAYPRN RASH for 60 Days           -   **ALBUTEROL INHALER (VENTOLIN HFA)** 2 PUFF Inhalation Q4HPRN SHORTNESS OF BREATH for 60 Days           -   **ACETAMINOPHEN (TYLENOL)** 650 MG Oral Q6HPRN PAIN for 60 Days           -   **BENZONATATE** 100 MG Oral TIDPRN COUGH for 60 Days           Comment [name] is a 54 yo male with history of HFrEF (EF 30%  05/2019), COPD not on home O2, HTN, DM, and, Schizoaffective disorder  presenting from a group home for shortness of breath.        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 History of previous admissions for CHF exacerbation requiring IV  Diuresis. Previous dry weight of 75kg with most recent weight 81kg in the ED.  Etiology of exacerbation unclear, but probable medication non-adherence given  poot historian.     PRELOAD: Continued fluid overloaded on exam with lower extremity edema and CXR  notable for increased pulmonary vascular congestion with left pleural effusion    - Give lasix 100mg  IV in AM    - Give a dose of diamox in AM given met alkalosis from contraction    --- Goal 1-2 L negative    - Strict I&O, Daily standing weights    PUMP    Echo 09/02 with normal sized LV with normal LV thickness and severely reduced  LV systolic function (EF 20-25%), Flattened septum consistent with RV  pressure/volume overload, Severe global hypokinesis of LV; Right Ventrible  moderate to severely dilated with severely reduced function, trace MR, severe  TR    - Restart Metroprolol at low dose 12.5mg  bid    AFTERLOAD    - Uptitrate Hydralazine to 50mg  tis    CORONARIES    - Continue home ASA and Atorvastatin 80mg     Neurohormonal: Previously discontinued Spironolactone for hyperkalemia in the  setting of K-sparing diuretics    -- Can consider Spironolactone for GDMT if hyperkalemia stable        #Acute COPD Exacerbation - PFTS (07/21/19) FEV1 24% with FEV1/FVC 40% GOLD IV.    - Prednisone 40mg  qd 5 days (9/1-9/5)    ---- s/p Azithromycin 500mg  IV Q24H (9/2-3)    - SSA/SSB, Anti-CCP serologies per previous Pulm note pending, RF negative    -- Duonebs Q4HPRN        #AKI - Improving    Cr 1.85 on admission, baseline Cr possibly ~1.2-1.3. AKI probable cardiorenal  in the setting of CHF exacerbation    - Diuresis as above        #Anemia    #Polycythemia    # Iron Deficiency Anemia    Hgb 18.2 and Hct 59.9 possibly 2/2 COPD vs  possible OSA. Can consider empiric  treatment of OSA with trial of CPAP if patient desaturates overnight. Iron  studies significant for Iron Deficiency anemia Iron 47, %Fe Saturation 12,  Ferritin 68, Transferrin 270.    - IV Venofer iso heart failure    - Consider outpatient workup (JAK2 testing and BM)    - EPO level    - Lower Extremity Ultrasound negative for DVT    - outpatient sleep study        #Tremor --  likely 2/2 to medication as it was present on admission with normal  glucose levels. (9/3) AM patient was hypoglycemic to 36, resolution of  hypoglycemia improved baseline tremor.    -- Monitor blood glucose, insulin changes as detailed below    -- continue outpatient psychiatric medications        #Constipation    Pt has hx of constipation and reports constipation on day of admission for the  past several days    - Senna BID    - cont home miralax and colace        #DM    On Metformin outpatient. Episode of hypoglycemia to 36 (9/3) AM. D/Ced Lantus  and 4u Lispro    - A1c 7.5    - SSI TIDWM    - hold home metformin        Chronic:    - Schizoaffective disorder: Continue home Carbamazepine 400 mg qam and 600 mg  qhs, Mirtazapine 15 mg qhs, Zyprexa 10 mg qhs, Seroquel 300 mg qhs    - HLD: Continue home atorvastatin        #Social: Pt lives in a group home        Full code (confirmed)    HH Diet    SQ heparin.    **Assessment and Plan**    Medication              -   **IRON SUCROSE COMPLEX (VENOFER)** 200 MG Intravenous QDAY for 2 Doses, Clinician Dir:ON DAYS 6 AND 7.   Pending Date: 01/16/2020          -   **HYDRALAZINE (APRESOLINE)** 50 MG Oral TID for 60 Days   Pending Date: 01/13/2020          -   **METOPROLOL (LOPRESSOR)** 12.5 MG Oral BID First Dose Now for 60 Days           -   **FUROSEMIDE (LASIX)** 100 MG Intravenous ONE TIME for 1 Doses           -   **ACETAZOLAMIDE (DIAMOX)** 250 MG Intravenous ONE TIME for 1 Doses           -   **REMOVE PATCH** 1 PATCH Miscellaneous QDAY for 60 Days, Clinician Dir:OLD NICOTINE PATCH           -   **PATCH INTACT QSHIFT (PATCH INTACT INSPECTION)** 1 PATCH Topical CHECK for 60 Days, Clinician ZOX:WRUEAVW (NICOTINE) PATCH EVERY SHIFT           -   **NICOTINE TRANSDERMAL (HABITROL TRANSDERMAL)** 21 MG Topical QDAY First Dose Now for 60 Days           -   **CARBAMAZEPINE (TEGRETOL)** 400 MG Oral QAM for 60 Days           -   **ASPIRIN CHEWABLE** 81 MG Oral  QDAY for 60 Days           -   ** ** ** **INSULIN LISPRO (HUMALOG) (HUMALOG)********  Sliding Scale Subcutaneous TIDWM for 60 Days      For Glucose 80 to 150 Give 0 Units    For Glucose 151 to 200 Give 2 Units    For Glucose 201 to 250 Give 3 Units    For Glucose 251 to 300 Give 4 Units    Notify Physician if: Call HO for glucose > 300          -   **IPRATROPIUM 0.02% INH (ATROVENT 0.02%)** 0.25 MG Inhalation Q6H for 60 Days, Clinician ZOX:[id number] FOR TRELEGY           -   **ATORVASTATIN (LIPITOR)** 20 MG Oral QHS for 60 Days           -   **CARBAMAZEPINE (TEGRETOL)** 600 MG Oral QHS for 60 Days           -   **QUETIAPINE FUMARATE (SEROQUEL)** 400 MG Oral QHS for 60 Days           -   **DORZOLAMIDE 2% OPHTH (TRUSOPT 2% OPHTH)** 1 DROP Both Eyes SOL BID for 60 Days           -   **DOCUSATE SODIUM (COLACE)** 100 MG Oral BID for 60 Days           -   **OLANZAPINE (ZYPREXA)** 10 MG Oral QHS for 60 Days           -   **HYDROCORTISONE 1% CREAM** 1 APP Topical BID for 60 Days, Clinician Dir:SEE EXTENDED INSTRUCTIONS INTERCHANGE FOR 2.5%           -   **MIRTAZAPINE (REMERON)** 15 MG Oral QHS for 60 Days           -   **FORMOTEROL FUMARATE (PERFOROMIST)** 20 MCG Inhalation BID for 60 Days, Clinician XBJ:[id number] FOR TRELEGY SEE ORDER #31           -   **BUDESONIDE INHALATION (PULMICORT RESPULES)** 0.25 MG Inhalation BID for 60 Days, Clinician QMV:[id number] FOR TRELEGY SEE ORDER #31           -   **BRIMONIDINE 0.2% OPHTH (ALPHAGAN 0.2% OPHTH)** 1 DROP Both Eyes SOL BID for 60 Days, Clinician UXL:[id number] FOR 0.15% STRENGTH           -   **SENNA (SENOKOT)** 8.6 MG Oral BID for 60 Days           -   **DEXTROSE 50% SYRINGE (DEXTROSE 50%)** 12.5 G Intravenous PRN BG < 60 AND CAN NOT TAKE PO for 60 Days           -   **GLUCAGON** 1 MG Intramuscular PRN BG < 60 AND NO IV ACCESS for 60 Days, Clinician Dir:MAY BE GIVEN IM OR SUB-Q           -   **BISACODYL (DULCOLAX)** 10 MG Rectal QDAYPRN  CONSTIPATION for 60 Days           -   **HEPARIN** 5000 UNITS Subcutaneous Q12H for 60 Days           -   **IPRATROPIUM/ALBUTEROL SULFATE (DUONEB)** 3 ML Inhalation Q4HPRN SOB/WHEEZING for 60 Days           -   **POLYETHYLENE GLYCOL (MIRALAX)** 17 G Oral QDAYPRN CONSTIPATION for 60 Days           -   **AMMONIUM LACTATE 12% LOTION (LAC-HYDRIN 12%)** 1 APP Topical QDAYPRN RASH for 60 Days           -   **  ALBUTEROL INHALER (VENTOLIN HFA)** 2 PUFF Inhalation Q4HPRN SHORTNESS OF BREATH for 60 Days           -   **ACETAMINOPHEN (TYLENOL)** 650 MG Oral Q6HPRN PAIN for 60 Days           -   **BENZONATATE** 100 MG Oral TIDPRN COUGH for 60 Days           Comment [name] is a 54 yo male with history of HFrEF (EF 30%  05/2019), COPD not on home O2, HTN, DM, and, Schizoaffective disorder  presenting from a group home for shortness of breath.        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 History of previous admissions for CHF exacerbation requiring IV  Diuresis. Previous dry weight of 75kg with most recent weight 81kg in the ED.  Etiology of exacerbation unclear, but probable medication non-adherence given  poot historian.    PRELOAD: Continued fluid overloaded on exam with lower extremity edema and CXR  notable for increased pulmonary vascular congestion with left pleural effusion    - Give lasix 100mg  IV in AM    - Give a dose of diamox in AM given met alkalosis from contraction    --- Goal 1-2 L negative    - Strict I&O, Daily standing weights    PUMP    Echo 09/02 with normal sized LV with normal LV thickness and severely reduced  LV systolic function (EF 20-25%), Flattened septum consistent with RV  pressure/volume overload, Severe global hypokinesis of LV; Right Ventrible  moderate to severely dilated with severely reduced function, trace MR, severe  TR    - Restart  Metroprolol at low dose 12.5mg  bid    AFTERLOAD    - Uptitrate Hydralazine to 50mg  tis    CORONARIES    - Continue home ASA and Atorvastatin 80mg     Neurohormonal: Previously discontinued Spironolactone for hyperkalemia in the  setting of K-sparing diuretics    -- Can consider Spironolactone for GDMT if hyperkalemia stable        #Acute COPD Exacerbation - PFTS (07/21/19) FEV1 24% with FEV1/FVC 40% GOLD IV.    - Prednisone 40mg  qd 5 days (9/1-9/5)    ---- s/p Azithromycin 500mg  IV Q24H (9/2-3)    - SSA/SSB, Anti-CCP serologies per previous Pulm note pending, RF negative    -- Duonebs Q4HPRN        #AKI - Improving    Cr 1.85 on admission, baseline Cr possibly ~1.2-1.3. AKI probable cardiorenal  in the setting of CHF exacerbation    - Diuresis as above        #Anemia    #Polycythemia    # Iron Deficiency Anemia    Hgb 18.2 and Hct 59.9 possibly 2/2 COPD vs possible OSA. Can consider empiric  treatment of OSA with trial of CPAP if patient desaturates overnight. Iron  studies significant for Iron Deficiency anemia Iron 47, %Fe Saturation 12,  Ferritin 68, Transferrin 270.    - IV Venofer iso heart failure    - Consider outpatient workup (JAK2 testing and BM)    - EPO level    - Lower Extremity Ultrasound negative for DVT    - outpatient sleep study        #Tremor -- likely 2/2 to medication as it was present  on admission with normal  glucose levels. (9/3) AM patient was hypoglycemic to 36, resolution of  hypoglycemia improved baseline tremor.    -- Monitor blood glucose, insulin changes as detailed below    -- continue outpatient psychiatric medications        #Constipation    Pt has hx of constipation and reports constipation on day of admission for the  past several days    - Senna BID    - cont home miralax and colace        #DM    On Metformin outpatient. Episode of hypoglycemia to 36 (9/3) AM. D/Ced Lantus  and 4u Lispro    - A1c 7.5    - SSI TIDWM    - hold home metformin        Chronic:    - Schizoaffective  disorder: Continue home Carbamazepine 400 mg qam and 600 mg  qhs, Mirtazapine 15 mg qhs, Zyprexa 10 mg qhs, Seroquel 300 mg qhs    - HLD: Continue home atorvastatin        #Social: Pt lives in a group home        Full code (confirmed)    HH Diet    SQ heparin.            **Electronically signed by Arnetha Gula, MD on 01/13/2020 12:44**        **Electronically signed by Arnetha Gula, MD on 01/13/2020 12:44**

## 2020-01-10 NOTE — Progress Notes (Signed)
 **Subjective**    Subjective No acute events overnight. This AM, the patient is without  complaints. He denies SOB. No CP. Reports improved breathing. Tolerating PO.  Normal bowel and bladder. .    **Subjective**    Subjective No acute events overnight. This AM, the patient is without  complaints. He denies SOB. No CP. Reports improved breathing. Tolerating PO.  Normal bowel and bladder. [name]    **Exam**    Vital Signs    01/16/2020 12:14              -  Temperature: 36.9 (35-37.8Cel)          -  Site: Axillary          -  Heart Rate: 96H (60-90)          -  Site: Monitor          -  BP: 114/81 (90-140/60-90)          -  Site: Right Arm          -  Position: Lying          -  Method: Automated          -  Respirations: 18 (12-20)          -  O2 Saturation (%): 95          -  O2 Delivery Method: NIPPV-BIPAP          -  MEWS Vital Sign Score: 1      01/16/2020 10:37              -  Morse Fall Risk Total: 85      01/16/2020 09:09              -  How Obtained: Bed Scale          -  Weight: 76.4kg      01/15/2020 15:16              -  O2 Amount: 1.0 LPM      CFS Vital Signs CS    01/16/2020 22:59              -  Shift Intake Total:          -  Shift Output Total:          -  Shift Balance: -        **Exam**    Vital Signs    01/16/2020 12:14              -  Temperature: 36.9 (35-37.8Cel)          -  Site: Axillary          -  Heart Rate: 96H (60-90)          -  Site: Monitor          -  BP: 114/81 (90-140/60-90)          -  Site: Right Arm          -  Position: Lying          -  Method: Automated          -  Respirations: 18 (12-20)          -  O2 Saturation (%): 95          -  O2 Delivery Method: NIPPV-BIPAP          -  MEWS Vital Sign Score:  1      01/16/2020 10:37              -  Lattie Corns Fall Risk Total: 85      01/16/2020 09:09              -  How Obtained: Bed Scale          -  Weight: 76.4kg      01/15/2020 15:16              -  O2  Amount: 1.0 LPM      CFS Vital Signs CS    01/16/2020 22:59              -  Shift Intake Total:          -  Shift Output Total:          -  Shift Balance: -        **Assessment and Plan**    Medication              -   **LACTULOSE (CEPHULAC)** 20 G Oral BID for 60 Days   Pending Date: 01/16/2020          -   **IPRATROPIUM/ALBUTEROL SULFATE (DUONEB)** 3 ML Inhalation Q4H for 60 Days   Pending Date: 01/16/2020          -   **VANCOMYCIN 1G IN BAG** Intravenous ONCE for 1 Doses, Clinician Dir:ONE TIME.           -   **PREDNISONE (DELTASONE)** 40 MG Oral QDAY First Dose Now for 60 Days           -   **CEFTRIAXONE (ROCEPHIN)** 1 G Intravenous Q24H First Dose Now for 60 Days, Clinician MVH:QIONGE TO 100 ML NS MED PLUS. INFUSE OVER 30 MIN.           -   **INSULIN LISPRO (HUMALOG) (HUMALOG)** 3 UNITS Subcutaneous ONE TIME for 1 Doses           -   **GUAIFENESIN SYRUP** 200 MG Oral Q4HPRN COUGH for 60 Days           -   **IRON SUCROSE COMPLEX (VENOFER)** 200 MG Intravenous QDAY for 2 Doses, Clinician Dir:ON DAYS 6 AND 7.           -   **HYDRALAZINE (APRESOLINE)** 50 MG Oral TID First Dose Now for 60 Days, Clinician Dir:SEE ORDER #55           -   **METOPROLOL (LOPRESSOR)** 12.5 MG Oral BID First Dose Now for 60 Days           -   **REMOVE PATCH** 1 PATCH Miscellaneous QDAY for 60 Days, Clinician Dir:OLD NICOTINE PATCH           -   **PATCH INTACT QSHIFT (PATCH INTACT INSPECTION)** 1 PATCH Topical CHECK for 60 Days, Clinician XBM:[id number] (NICOTINE) PATCH EVERY SHIFT           -   **NICOTINE TRANSDERMAL (HABITROL TRANSDERMAL)** 21 MG Topical QDAY First Dose Now for 60 Days           -   **CARBAMAZEPINE (TEGRETOL)** 400 MG Oral QAM for 60 Days           -   **ASPIRIN CHEWABLE** 81 MG Oral QDAY for 60 Days           -   ** ** ** **INSULIN LISPRO (HUMALOG) (HUMALOG)******** Sliding Scale Subcutaneous TIDWM  for 60 Days      For Glucose 80 to 150 Give 0 Units     For Glucose 151 to 200 Give 2 Units    For Glucose 201 to 250 Give 3 Units    For Glucose 251 to 300 Give 4 Units    Notify Physician if: Call HO for glucose > 300          -   **DORZOLAMIDE 2% OPHTH (TRUSOPT 2% OPHTH)** 1 DROP Both Eyes SOL BID for 60 Days           -   **ATORVASTATIN (LIPITOR)** 20 MG Oral QHS for 60 Days           -   **DOCUSATE SODIUM (COLACE)** 100 MG Oral BID for 60 Days           -   **QUETIAPINE FUMARATE (SEROQUEL)** 400 MG Oral QHS for 60 Days           -   **BRIMONIDINE 0.2% OPHTH (ALPHAGAN 0.2% OPHTH)** 1 DROP Both Eyes SOL BID for 60 Days, Clinician AOZ:[id number] FOR 0.15% STRENGTH           -   **OLANZAPINE (ZYPREXA)** 10 MG Oral QHS for 60 Days           -   **HYDROCORTISONE 1% CREAM** 1 APP Topical BID for 60 Days, Clinician Dir:SEE EXTENDED INSTRUCTIONS INTERCHANGE FOR 2.5%           -   **FORMOTEROL FUMARATE (PERFOROMIST)** 20 MCG Inhalation BID for 60 Days, Clinician EXB:[id number] FOR TRELEGY SEE ORDER #31           -   **BUDESONIDE INHALATION (PULMICORT RESPULES)** 0.25 MG Inhalation BID for 60 Days, Clinician UVO:[id number] FOR TRELEGY SEE ORDER #31           -   **CARBAMAZEPINE (TEGRETOL)** 600 MG Oral QHS for 60 Days           -   **MIRTAZAPINE (REMERON)** 15 MG Oral QHS for 60 Days           -   **SENNA (SENOKOT)** 8.6 MG Oral BID for 60 Days           -   **GLUCAGON** 1 MG Intramuscular PRN BG < 60 AND NO IV ACCESS for 60 Days, Clinician Dir:MAY BE GIVEN IM OR SUB-Q           -   **DEXTROSE 50% SYRINGE (DEXTROSE 50%)** 12.5 G Intravenous PRN BG < 60 AND CAN NOT TAKE PO for 60 Days           -   **BISACODYL (DULCOLAX)** 10 MG Rectal QDAYPRN CONSTIPATION for 60 Days           -   **HEPARIN** 5000 UNITS Subcutaneous Q12H for 60 Days           -   **IPRATROPIUM/ALBUTEROL SULFATE (DUONEB)** 3 ML Inhalation Q4HPRN SOB/WHEEZING for 60 Days           -   **BENZONATATE** 100 MG Oral TIDPRN COUGH for 60 Days           -   **AMMONIUM LACTATE  12% LOTION (LAC-HYDRIN 12%)** 1 APP Topical QDAYPRN RASH for 60 Days           -   **ALBUTEROL INHALER (VENTOLIN HFA)** 2 PUFF Inhalation Q4HPRN SHORTNESS OF BREATH for 60 Days           -   **ACETAMINOPHEN (TYLENOL)** 650 MG Oral  Q6HPRN PAIN for 60 Days           -   **POLYETHYLENE GLYCOL (MIRALAX)** 17 G Oral QDAYPRN CONSTIPATION for 60 Days           Pulmonology Assessment and Plan Comment COPD Exacerbation    Acute on Chronic Hypercapnic and Hypoxic Respiratory Failure    - afebrile and HDS overnight; 88-92% on RA this AM    - exam with decreased air movement bilaterally; concurrent expiratory  wheezes. Trace LE edema and no JVD    - labs with creatinine 2.55. CL 97 and CO2 37. Most recent ABG 7.35/76/80/41    - labs and exam consistent with improvement in volume status. Now suspect  component of COPD with exam and onset s/p steroids    - will plan for prolonged steroid taper given severity of obstruction on PFTs  (FEV1 24%) and recurrent after initial treatment    PLAN    - start prednisone 40 mg daily; decrease by 10 mg every 7 days until off    - agree with holding diuresis    - continue LABA/LAMA and ICS; PRN duonebs    - do not anticipate RHC for this patient given poor functional status and  poor candidacy for PHTN therapies    - oxygen goal > 88%; wean oxygen as tolerated    - patient should have outpatient pulm follow up within 1 month of discharge        Thank you for this consult. Seen and staffed with Dr. Brion Aliment. Attending  attestation to follow.        Liliana Cline    Pulmonary/Critical Care Fellow PGY-4.    **Assessment and Plan**    Medication              -   **LACTULOSE (CEPHULAC)** 20 G Oral BID for 60 Days   Pending Date: 01/16/2020          -   **IPRATROPIUM/ALBUTEROL SULFATE (DUONEB)** 3 ML Inhalation Q4H for 60 Days   Pending Date: 01/16/2020          -   **VANCOMYCIN 1G IN BAG** Intravenous ONCE for 1 Doses, Clinician Dir:ONE TIME.           -   **PREDNISONE  (DELTASONE)** 40 MG Oral QDAY First Dose Now for 60 Days           -   **CEFTRIAXONE (ROCEPHIN)** 1 G Intravenous Q24H First Dose Now for 60 Days, Clinician PPI:RJJOAC TO 100 ML NS MED PLUS. INFUSE OVER 30 MIN.           -   **INSULIN LISPRO (HUMALOG) (HUMALOG)** 3 UNITS Subcutaneous ONE TIME for 1 Doses           -   **GUAIFENESIN SYRUP** 200 MG Oral Q4HPRN COUGH for 60 Days           -   **IRON SUCROSE COMPLEX (VENOFER)** 200 MG Intravenous QDAY for 2 Doses, Clinician Dir:ON DAYS 6 AND 7.           -   **HYDRALAZINE (APRESOLINE)** 50 MG Oral TID First Dose Now for 60 Days, Clinician Dir:SEE ORDER #55           -   **METOPROLOL (LOPRESSOR)** 12.5 MG Oral BID First Dose Now for 60 Days           -   **REMOVE PATCH** 1 PATCH Miscellaneous QDAY for 60 Days, Clinician Dir:OLD NICOTINE PATCH           -   **  PATCH INTACT QSHIFT (PATCH INTACT INSPECTION)** 1 PATCH Topical CHECK for 60 Days, Clinician VWU:[id number] (NICOTINE) PATCH EVERY SHIFT           -   **NICOTINE TRANSDERMAL (HABITROL TRANSDERMAL)** 21 MG Topical QDAY First Dose Now for 60 Days           -   **CARBAMAZEPINE (TEGRETOL)** 400 MG Oral QAM for 60 Days           -   **ASPIRIN CHEWABLE** 81 MG Oral QDAY for 60 Days           -   ** ** ** **INSULIN LISPRO (HUMALOG) (HUMALOG)******** Sliding Scale Subcutaneous TIDWM for 60 Days      For Glucose 80 to 150 Give 0 Units    For Glucose 151 to 200 Give 2 Units    For Glucose 201 to 250 Give 3 Units    For Glucose 251 to 300 Give 4 Units    Notify Physician if: Call HO for glucose > 300          -   **DORZOLAMIDE 2% OPHTH (TRUSOPT 2% OPHTH)** 1 DROP Both Eyes SOL BID for 60 Days           -   **ATORVASTATIN (LIPITOR)** 20 MG Oral QHS for 60 Days           -   **DOCUSATE SODIUM (COLACE)** 100 MG Oral BID for 60 Days           -   **QUETIAPINE FUMARATE (SEROQUEL)** 400 MG Oral QHS for 60 Days           -   **BRIMONIDINE 0.2% OPHTH (ALPHAGAN 0.2% OPHTH)** 1 DROP Both Eyes SOL BID for 60  Days, Clinician NWG:[id number] FOR 0.15% STRENGTH           -   **OLANZAPINE (ZYPREXA)** 10 MG Oral QHS for 60 Days           -   **HYDROCORTISONE 1% CREAM** 1 APP Topical BID for 60 Days, Clinician Dir:SEE EXTENDED INSTRUCTIONS INTERCHANGE FOR 2.5%           -   **FORMOTEROL FUMARATE (PERFOROMIST)** 20 MCG Inhalation BID for 60 Days, Clinician QIO:[id number] FOR TRELEGY SEE ORDER #31           -   **BUDESONIDE INHALATION (PULMICORT RESPULES)** 0.25 MG Inhalation BID for 60 Days, Clinician GMW:[id number] FOR TRELEGY SEE ORDER #31           -   **CARBAMAZEPINE (TEGRETOL)** 600 MG Oral QHS for 60 Days           -   **MIRTAZAPINE (REMERON)** 15 MG Oral QHS for 60 Days           -   **SENNA (SENOKOT)** 8.6 MG Oral BID for 60 Days           -   **GLUCAGON** 1 MG Intramuscular PRN BG < 60 AND NO IV ACCESS for 60 Days, Clinician Dir:MAY BE GIVEN IM OR SUB-Q           -   **DEXTROSE 50% SYRINGE (DEXTROSE 50%)** 12.5 G Intravenous PRN BG < 60 AND CAN NOT TAKE PO for 60 Days           -   **BISACODYL (DULCOLAX)** 10 MG Rectal QDAYPRN CONSTIPATION for 60 Days           -   **HEPARIN** 5000 UNITS Subcutaneous Q12H for 60 Days           -   **  IPRATROPIUM/ALBUTEROL SULFATE (DUONEB)** 3 ML Inhalation Q4HPRN SOB/WHEEZING for 60 Days           -   **BENZONATATE** 100 MG Oral TIDPRN COUGH for 60 Days           -   **AMMONIUM LACTATE 12% LOTION (LAC-HYDRIN 12%)** 1 APP Topical QDAYPRN RASH for 60 Days           -   **ALBUTEROL INHALER (VENTOLIN HFA)** 2 PUFF Inhalation Q4HPRN SHORTNESS OF BREATH for 60 Days           -   **ACETAMINOPHEN (TYLENOL)** 650 MG Oral Q6HPRN PAIN for 60 Days           -   **POLYETHYLENE GLYCOL (MIRALAX)** 17 G Oral QDAYPRN CONSTIPATION for 60 Days           Pulmonology Assessment and Plan Comment COPD Exacerbation    Acute on Chronic Hypercapnic and Hypoxic Respiratory Failure    - afebrile and HDS overnight; 88-92% on RA this AM    - exam with decreased air movement  bilaterally; concurrent expiratory  wheezes. Trace LE edema and no JVD    - labs with creatinine 2.55. CL 97 and CO2 37. Most recent ABG 7.35/76/80/41    - labs and exam consistent with improvement in volume status. Now suspect  component of COPD with exam and onset s/p steroids    - will plan for prolonged steroid taper given severity of obstruction on PFTs  (FEV1 24%) and recurrent after initial treatment    PLAN    - start prednisone 40 mg daily; decrease by 10 mg every 7 days until off    - agree with holding diuresis    - continue LABA/LAMA and ICS; PRN duonebs    - do not anticipate RHC for this patient given poor functional status and  poor candidacy for PHTN therapies    - oxygen goal > 88%; wean oxygen as tolerated    - patient should have outpatient pulm follow up within 1 month of discharge        Thank you for this consult. Seen and staffed with Dr. Brion Aliment. Attending  attestation to follow.        Liliana Cline    Pulmonary/Critical Care Fellow PGY-4.            **Electronically signed by Rebeca Allegra, MD on 01/16/2020 17:40**        **Electronically signed by Rebeca Allegra, MD on 01/16/2020 17:40**

## 2020-01-10 NOTE — Progress Notes (Signed)
 **Subjective**    Subjective Overnight:    - No acute events        This AM:    - Feels well and ready to go home    --- Unfortunately group home unable to accept with foley    - Denies chest pain, nausea, vomiting, diarrhea, and headache.    Tolerating Diet Yes.    **Subjective**    Subjective Overnight:    - No acute events        This AM:    - Feels well and ready to go home    --- Unfortunately group home unable to accept with foley    - Denies chest pain, nausea, vomiting, diarrhea, and headache.    Tolerating Diet Yes.    **ROS**    Complete Review of Systems All other systems reviewed and negative except as  noted in the HPI.    **ROS**    Complete Review of Systems All other systems reviewed and negative except as  noted in the HPI.    **Exam**    Comment Weight: 74.8 (9/9) -> 75.16 -> 75.5 -> 75.6 kg    I/O: +1.2/-4.3/-3.12 L        Gen: NAD, Lying in bed    HEENT: PERRL, JVD unable to be observed    CV: RRR, no murmurs, rubs, gallops    Lung: CTAB, normal WOB    Abd: Soft, Nontender, no rebound, no guarding    Ext: 1+ bilateral LE edema, slightly improved from yesterday    Neuro: no focal deficits .      Vital Signs    01/21/2020 06:25              -  Weight: 75.6kg      01/21/2020 02:50              -  Temperature: 36.9 (35-37.8Cel)          -  Site: Oral          -  Heart Rate: 91H (60-90)          -  Site: Monitor          -  BP: 100/64 (90-140/60-90)          -  Site: Left Arm          -  Position: Sitting          -  Method: Automated          -  Respirations: 18 (12-20)          -  O2 Saturation (%): 96          -  O2 Delivery Method: Room Air          -  MEWS Vital Sign Score: 1      01/21/2020 00:05              -  Lattie Corns Fall Risk Total: 85      01/20/2020 07:00              -  How Obtained: Stand. Scale      CFS Vital Signs CS    01/21/2020 06:59              -  Shift Intake Total: 0ml          -  Shift Output Total:          -  Shift Balance:  -        **  Exam**    Comment Weight: 74.8 (9/9) -> 75.16 -> 75.5 -> 75.6 kg    I/O: +1.2/-4.3/-3.12 L        Gen: NAD, Lying in bed    HEENT: PERRL, JVD unable to be observed    CV: RRR, no murmurs, rubs, gallops    Lung: CTAB, normal WOB    Abd: Soft, Nontender, no rebound, no guarding    Ext: 1+ bilateral LE edema, slightly improved from yesterday    Neuro: no focal deficits .      Vital Signs    01/21/2020 06:25              -  Weight: 75.6kg      01/21/2020 02:50              -  Temperature: 36.9 (35-37.8Cel)          -  Site: Oral          -  Heart Rate: 91H (60-90)          -  Site: Monitor          -  BP: 100/64 (90-140/60-90)          -  Site: Left Arm          -  Position: Sitting          -  Method: Automated          -  Respirations: 18 (12-20)          -  O2 Saturation (%): 96          -  O2 Delivery Method: Room Air          -  MEWS Vital Sign Score: 1      01/21/2020 00:05              -  Lattie Corns Fall Risk Total: 85      01/20/2020 07:00              -  How Obtained: Stand. Scale      CFS Vital Signs CS    01/21/2020 06:59              -  Shift Intake Total: 0ml          -  Shift Output Total:          -  Shift Balance: -        **Assessment and Plan**    Medication              -   **CIPROFLOXACIN (CIPRO)** 750 MG Oral BID First Dose Now for 60 Days           -   **FUROSEMIDE (LASIX)** 40 MG Oral QDAY First Dose Now for 60 Days           -   **HYDRALAZINE (APRESOLINE)** 50 MG Oral TID for 60 Days, Clinician UJW:JXBJ FOR SYSTOLIC BP <90           -   **METOPROLOL XL (TOPROL XL)** 75 MG Oral QDAY for 60 Days           -   **LACTULOSE (CEPHULAC)** 20 G Oral BID for 60 Days           -   **IPRATROPIUM/ALBUTEROL SULFATE (DUONEB)** 3 ML Inhalation Q4H for 60 Days           -   **PREDNISONE (DELTASONE)** 40 MG Oral QDAY First Dose Now  for 60 Days           -   **GUAIFENESIN SYRUP** 200 MG Oral Q4HPRN COUGH for 60 Days           -    **REMOVE PATCH** 1 PATCH Miscellaneous QDAY for 60 Days, Clinician Dir:OLD NICOTINE PATCH           -   **PATCH INTACT QSHIFT (PATCH INTACT INSPECTION)** 1 PATCH Topical CHECK for 60 Days, Clinician BJY:[id number] (NICOTINE) PATCH EVERY SHIFT           -   **NICOTINE TRANSDERMAL (HABITROL TRANSDERMAL)** 21 MG Topical QDAY First Dose Now for 60 Days           -   **CARBAMAZEPINE (TEGRETOL)** 400 MG Oral QAM for 60 Days           -   **ASPIRIN CHEWABLE** 81 MG Oral QDAY for 60 Days           -   ** ** ** **INSULIN LISPRO (HUMALOG) (HUMALOG)******** Sliding Scale Subcutaneous TIDWM for 60 Days      For Glucose 80 to 150 Give 0 Units    For Glucose 151 to 200 Give 2 Units    For Glucose 201 to 250 Give 3 Units    For Glucose 251 to 300 Give 4 Units    Notify Physician if: Call HO for glucose > 300          -   **OLANZAPINE (ZYPREXA)** 10 MG Oral QHS for 60 Days           -   **DOCUSATE SODIUM (COLACE)** 100 MG Oral BID for 60 Days           -   **MIRTAZAPINE (REMERON)** 15 MG Oral QHS for 60 Days           -   **CARBAMAZEPINE (TEGRETOL)** 600 MG Oral QHS for 60 Days           -   **DORZOLAMIDE 2% OPHTH (TRUSOPT 2% OPHTH)** 1 DROP Both Eyes SOL BID for 60 Days           -   **SENNA (SENOKOT)** 8.6 MG Oral BID for 60 Days           -   **BRIMONIDINE 0.2% OPHTH (ALPHAGAN 0.2% OPHTH)** 1 DROP Both Eyes SOL BID for 60 Days, Clinician ZHY:[id number] FOR 0.15% STRENGTH           -   **ATORVASTATIN (LIPITOR)** 20 MG Oral QHS for 60 Days           -   **QUETIAPINE FUMARATE (SEROQUEL)** 400 MG Oral QHS for 60 Days           -   **BUDESONIDE INHALATION (PULMICORT RESPULES)** 0.25 MG Inhalation BID for 60 Days, Clinician BMW:[id number] FOR TRELEGY SEE ORDER #31           -   **HYDROCORTISONE 1% CREAM** 1 APP Topical BID for 60 Days, Clinician Dir:SEE EXTENDED INSTRUCTIONS INTERCHANGE FOR 2.5%           -   **FORMOTEROL FUMARATE (PERFOROMIST)** 20 MCG Inhalation BID for 60 Days, Clinician  OZD:[id number] FOR TRELEGY SEE ORDER #31           -   **GLUCAGON** 1 MG Intramuscular PRN BG < 60 AND NO IV ACCESS for 60 Days, Clinician Dir:MAY BE GIVEN IM OR SUB-Q           -   **DEXTROSE 50% SYRINGE (  DEXTROSE 50%)** 12.5 G Intravenous PRN BG < 60 AND CAN NOT TAKE PO for 60 Days           -   **BISACODYL (DULCOLAX)** 10 MG Rectal QDAYPRN CONSTIPATION for 60 Days           -   **HEPARIN** 5000 UNITS Subcutaneous Q12H for 60 Days           -   **IPRATROPIUM/ALBUTEROL SULFATE (DUONEB)** 3 ML Inhalation Q4HPRN SOB/WHEEZING for 60 Days           -   **POLYETHYLENE GLYCOL (MIRALAX)** 17 G Oral QDAYPRN CONSTIPATION for 60 Days           -   **AMMONIUM LACTATE 12% LOTION (LAC-HYDRIN 12%)** 1 APP Topical QDAYPRN RASH for 60 Days           -   **ACETAMINOPHEN (TYLENOL)** 650 MG Oral Q6HPRN PAIN for 60 Days           -   **ALBUTEROL INHALER (VENTOLIN HFA)** 2 PUFF Inhalation Q4HPRN SHORTNESS OF BREATH for 60 Days           -   **BENZONATATE** 100 MG Oral TIDPRN COUGH for 60 Days           Comment Mr. Takuya Lariccia is a 54 yo male with history of HFrEF (EF 30%  05/2019), COPD not on home O2, HTN, DM, and, Schizoaffective disorder  presenting from a group home for shortness of breath.        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 History of previous admissions for CHF exacerbation requiring IV  Diuresis. Previous dry weight of 75kg with most recent weight 81kg in the ED.  Etiology of exacerbation unclear, but probable medication non-adherence given  poot historian. Spoke to patient with his daughter Donnie Aho) present on (9/9)  regarding CHF/COPD exacerbation and possible malignancy with subsequent steps  -- voiced understanding and that she would be the most appropriate HCP.    PRELOAD: Continued fluid overloaded on exam with lower extremity edema and CXR  notable for  increased pulmonary vascular congestion with left pleural effusion    - Double home lasix and give 80mg  po this AM with persistently increase LE  edema    --- Goal 0.5 - 1.0L    - Strict I&O, Daily standing weights    PUMP    Echo 09/02 with normal sized LV with normal LV thickness and severely reduced  LV systolic function (EF 20-25%), Flattened septum consistent with RV  pressure/volume overload, Severe global hypokinesis of LV; Right Ventricle  moderate to severely dilated with severely reduced function, trace MR, severe  TR    - Metroprolol 75mg  XL qd    - Repeat inpatient TTE as outpatient to re-evaluate right sided dysfunction    AFTERLOAD    - Hydralazine 50mg  TID    CORONARIES    - Continue home ASA and Atorvastatin 80mg     Neurohormonal: Previously discontinued Spironolactone for hyperkalemia in the  setting of K-sparing diuretics    - Can consider Spironolactone for GDMT if hyperkalemia stable    - Can consider SGLT2 inhibior as outpatient given hx DM        # Acute COPD Exacerbation    # Pseudomonas Pneumonia    PFTS (07/21/19) FEV1 24% with FEV1/FVC 40%  GOLD IV. Pulm previously wanted  outpatient CT Chest, as patient is inpatient and being actively diuresed with  changes in mental status, will go ahead and pursue inpatient CT Chest.  SSA/SSB, RF per previous Pulm note Negative. Tmax 38.0, BCx drawn (NGTD), and  SBPs in 90s. Afebrile as of (9/9) without leukocytosis.    - Sputum Cx: Pansensitive Pseudomonas    - Cont Ciprofloxacin (9/11-) given sensitive on culture    --- End date 9/14    --- Check EKG QTC in AM tomorrow    --- S/p Cefepime 1g Q24H (9/8 - 9/11), Vancomycin (9/7-9/9), Ceftriaxone 1g  Q24H (9/7-9/8), Azithromycin 500mg  IV Q24H (9/2-3)    - Pulmonary c/s, recs appreciated    --- Plan for prolonged prednisone taper    --- do not anticipate RHC for this patient given poor functional status and  poor candidacy for PHTN therapies    - Prednisone 40mg  qd 5 days (9/7-)    --- S/p Prednisone 40mg   (s/p 9/1-9/5)    --- Plan to decrease by 10 mg every 7 days until off    -- Duonebs Q4H    - oxygen goal > 88%        #Pulmonay Nodules    CT Chest Impression (9/7) noted new innumerable bilateral pulmonary nodules  are concerning for metastasis. CT of the abdomen and pelvis with IV contrast  is recommended for further evaluation with a small focal lucency in the  manubrium of the sternum could represent a lytic metastasis. Given possible  metastatis, will look for primary cancer site    - PSA WNL    - Definitive biopsy would likely be a VATS wedge -- no primary tumor on CT  A/P    - F/u outpatient clinic        #AKI    #Urinary Retension    Cr 1.85 on admission, baseline Cr possibly ~1.2-1.3. AKI probable cardiorenal  in the setting of CHF exacerbation, 1.67 (9/5), will decrease magnitude of  diuresis as patient is approaching a euvolemic state. Creatinine (9/5) 1.83 ->  2.02 -> 1.99 (9/6) -> 2.55 (9/7) -> 1.92 (9/9). Etiology of AKI most likely  2/2 new urinary retention as found on RUQ Ultrasound (9/7) and improvement in  kidney function with placement of foley catheter (9/8).    - Failed trial of void 9/10    - Start flomax today    - Plan to f/u urology as outpatient for next trial of void    - Monitor SCr        #Anemia    #Polycythemia    # Iron Deficiency Anemia    Hgb 18.2 and Hct 59.9 possibly 2/2 COPD vs possible OSA. Can consider empiric  treatment of OSA with trial of CPAP if patient desaturates overnight. Iron  studies significant for Iron Deficiency anemia Iron 47, %Fe Saturation 12,  Ferritin 68, Transferrin 270. 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    - IV Venofer iso heart failure    - Consider outpatient workup (JAK2 testing and BM)    - Lower Extremity Ultrasound negative for DVT    - Outpatient sleep study        #Cholestatic Liver Enzymes -- Elevated Alk Phos and GGT. Unknown if patient  has Hx of cirrhosis although with normal AST/ALT, less  likely, but would  prefer inpatient imaging to assess. Eyes appear slightly icteric (9/6) PM  without significant elevation of Bilirubin.    -  RUQ Ultrasound (9/6) Impression noted Massive distention of the urinary  bladder with estimated volume of 1.5 L and Unremarkable appearance of the  liver        #Constipation    Pt has hx of constipation and reports constipation on day of admission for the  past several days    - Lactulose    - Senna BID    - cont home miralax and colace        #DM    On Metformin outpatient. Episode of hypoglycemia to 36 (9/3) AM. D/Ced Lantus  and 4u Lispro    - A1c 7.5    - SSI TIDWM    - hold home metformin        Chronic:    - Tremor: Patient has baseline upper extremity tremors with home psychiatric  medications    - Schizoaffective disorder: Continue home Carbamazepine 400 mg qam and 600 mg  qhs, Mirtazapine 15 mg qhs, Zyprexa 10 mg qhs, Seroquel 300 mg qhs    - HLD: Continue home atorvastatin        Full code (confirmed)    HH Diet    SQ heparin    Daughter: Hulda Humphrey: [phone]    Social: Pt lives in a group home.    **Assessment and Plan**    Medication              -   **CIPROFLOXACIN (CIPRO)** 750 MG Oral BID First Dose Now for 60 Days           -   **FUROSEMIDE (LASIX)** 40 MG Oral QDAY First Dose Now for 60 Days           -   **HYDRALAZINE (APRESOLINE)** 50 MG Oral TID for 60 Days, Clinician AOZ:HYQM FOR SYSTOLIC BP <90           -   **METOPROLOL XL (TOPROL XL)** 75 MG Oral QDAY for 60 Days           -   **LACTULOSE (CEPHULAC)** 20 G Oral BID for 60 Days           -   **IPRATROPIUM/ALBUTEROL SULFATE (DUONEB)** 3 ML Inhalation Q4H for 60 Days           -   **PREDNISONE (DELTASONE)** 40 MG Oral QDAY First Dose Now for 60 Days           -   **GUAIFENESIN SYRUP** 200 MG Oral Q4HPRN COUGH for 60 Days           -   **REMOVE PATCH** 1 PATCH Miscellaneous QDAY for 60 Days, Clinician Dir:OLD NICOTINE PATCH           -   **PATCH INTACT QSHIFT (PATCH INTACT  INSPECTION)** 1 PATCH Topical CHECK for 60 Days, Clinician VHQ:[id number] (NICOTINE) PATCH EVERY SHIFT           -   **NICOTINE TRANSDERMAL (HABITROL TRANSDERMAL)** 21 MG Topical QDAY First Dose Now for 60 Days           -   **CARBAMAZEPINE (TEGRETOL)** 400 MG Oral QAM for 60 Days           -   **ASPIRIN CHEWABLE** 81 MG Oral QDAY for 60 Days           -   ** ** ** **INSULIN LISPRO (HUMALOG) (HUMALOG)******** Sliding Scale Subcutaneous TIDWM for 60 Days      For Glucose 80 to 150 Give 0 Units    For  Glucose 151 to 200 Give 2 Units    For Glucose 201 to 250 Give 3 Units    For Glucose 251 to 300 Give 4 Units    Notify Physician if: Call HO for glucose > 300          -   **OLANZAPINE (ZYPREXA)** 10 MG Oral QHS for 60 Days           -   **DOCUSATE SODIUM (COLACE)** 100 MG Oral BID for 60 Days           -   **MIRTAZAPINE (REMERON)** 15 MG Oral QHS for 60 Days           -   **CARBAMAZEPINE (TEGRETOL)** 600 MG Oral QHS for 60 Days           -   **DORZOLAMIDE 2% OPHTH (TRUSOPT 2% OPHTH)** 1 DROP Both Eyes SOL BID for 60 Days           -   **SENNA (SENOKOT)** 8.6 MG Oral BID for 60 Days           -   **BRIMONIDINE 0.2% OPHTH (ALPHAGAN 0.2% OPHTH)** 1 DROP Both Eyes SOL BID for 60 Days, Clinician HYQ:[id number] FOR 0.15% STRENGTH           -   **ATORVASTATIN (LIPITOR)** 20 MG Oral QHS for 60 Days           -   **QUETIAPINE FUMARATE (SEROQUEL)** 400 MG Oral QHS for 60 Days           -   **BUDESONIDE INHALATION (PULMICORT RESPULES)** 0.25 MG Inhalation BID for 60 Days, Clinician MWU:[id number] FOR TRELEGY SEE ORDER #31           -   **HYDROCORTISONE 1% CREAM** 1 APP Topical BID for 60 Days, Clinician Dir:SEE EXTENDED INSTRUCTIONS INTERCHANGE FOR 2.5%           -   **FORMOTEROL FUMARATE (PERFOROMIST)** 20 MCG Inhalation BID for 60 Days, Clinician ZDG:[id number] FOR TRELEGY SEE ORDER #31           -   **GLUCAGON** 1 MG Intramuscular PRN BG < 60 AND NO IV ACCESS for 60 Days, Clinician Dir:MAY BE  GIVEN IM OR SUB-Q           -   **DEXTROSE 50% SYRINGE (DEXTROSE 50%)** 12.5 G Intravenous PRN BG < 60 AND CAN NOT TAKE PO for 60 Days           -   **BISACODYL (DULCOLAX)** 10 MG Rectal QDAYPRN CONSTIPATION for 60 Days           -   **HEPARIN** 5000 UNITS Subcutaneous Q12H for 60 Days           -   **IPRATROPIUM/ALBUTEROL SULFATE (DUONEB)** 3 ML Inhalation Q4HPRN SOB/WHEEZING for 60 Days           -   **POLYETHYLENE GLYCOL (MIRALAX)** 17 G Oral QDAYPRN CONSTIPATION for 60 Days           -   **AMMONIUM LACTATE 12% LOTION (LAC-HYDRIN 12%)** 1 APP Topical QDAYPRN RASH for 60 Days           -   **ACETAMINOPHEN (TYLENOL)** 650 MG Oral Q6HPRN PAIN for 60 Days           -   **ALBUTEROL INHALER (VENTOLIN HFA)** 2 PUFF Inhalation Q4HPRN SHORTNESS OF BREATH for 60 Days           -   **BENZONATATE**  100 MG Oral TIDPRN COUGH for 60 Days           Comment [name] is a 55 yo male with history of HFrEF (EF 30%  05/2019), COPD not on home O2, HTN, DM, and, Schizoaffective disorder  presenting from a group home for shortness of breath.        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 History of previous admissions for CHF exacerbation requiring IV  Diuresis. Previous dry weight of 75kg with most recent weight 81kg in the ED.  Etiology of exacerbation unclear, but probable medication non-adherence given  poot historian. Spoke to patient with his daughter Donnie Aho) present on (9/9)  regarding CHF/COPD exacerbation and possible malignancy with subsequent steps  -- voiced understanding and that she would be the most appropriate HCP.    PRELOAD: Continued fluid overloaded on exam with lower extremity edema and CXR  notable for increased pulmonary vascular congestion with left pleural effusion    - Double home lasix and give 80mg  po this AM with persistently increase LE  edema    ---  Goal 0.5 - 1.0L    - Strict I&O, Daily standing weights    PUMP    Echo 09/02 with normal sized LV with normal LV thickness and severely reduced  LV systolic function (EF 20-25%), Flattened septum consistent with RV  pressure/volume overload, Severe global hypokinesis of LV; Right Ventricle  moderate to severely dilated with severely reduced function, trace MR, severe  TR    - Metroprolol 75mg  XL qd    - Repeat inpatient TTE as outpatient to re-evaluate right sided dysfunction    AFTERLOAD    - Hydralazine 50mg  TID    CORONARIES    - Continue home ASA and Atorvastatin 80mg     Neurohormonal: Previously discontinued Spironolactone for hyperkalemia in the  setting of K-sparing diuretics    - Can consider Spironolactone for GDMT if hyperkalemia stable    - Can consider SGLT2 inhibior as outpatient given hx DM        # Acute COPD Exacerbation    # Pseudomonas Pneumonia    PFTS (07/21/19) FEV1 24% with FEV1/FVC 40% GOLD IV. Pulm previously wanted  outpatient CT Chest, as patient is inpatient and being actively diuresed with  changes in mental status, will go ahead and pursue inpatient CT Chest.  SSA/SSB, RF per previous Pulm note Negative. Tmax 38.0, BCx drawn (NGTD), and  SBPs in 90s. Afebrile as of (9/9) without leukocytosis.    - Sputum Cx: Pansensitive Pseudomonas    - Cont Ciprofloxacin (9/11-) given sensitive on culture    --- End date 9/14    --- Check EKG QTC in AM tomorrow    --- S/p Cefepime 1g Q24H (9/8 - 9/11), Vancomycin (9/7-9/9), Ceftriaxone 1g  Q24H (9/7-9/8), Azithromycin 500mg  IV Q24H (9/2-3)    - Pulmonary c/s, recs appreciated    --- Plan for prolonged prednisone taper    --- do not anticipate RHC for this patient given poor functional status and  poor candidacy for PHTN therapies    - Prednisone 40mg  qd 5 days (9/7-)    --- S/p Prednisone 40mg  (s/p 9/1-9/5)    --- Plan to decrease by 10 mg every 7 days until off    -- Duonebs Q4H    - oxygen goal >  88%        #Pulmonay Nodules    CT Chest Impression  (9/7) noted new innumerable bilateral pulmonary nodules  are concerning for metastasis. CT of the abdomen and pelvis with IV contrast  is recommended for further evaluation with a small focal lucency in the  manubrium of the sternum could represent a lytic metastasis. Given possible  metastatis, will look for primary cancer site    - PSA WNL    - Definitive biopsy would likely be a VATS wedge -- no primary tumor on CT  A/P    - F/u outpatient clinic        #AKI    #Urinary Retension    Cr 1.85 on admission, baseline Cr possibly ~1.2-1.3. AKI probable cardiorenal  in the setting of CHF exacerbation, 1.67 (9/5), will decrease magnitude of  diuresis as patient is approaching a euvolemic state. Creatinine (9/5) 1.83 ->  2.02 -> 1.99 (9/6) -> 2.55 (9/7) -> 1.92 (9/9). Etiology of AKI most likely  2/2 new urinary retention as found on RUQ Ultrasound (9/7) and improvement in  kidney function with placement of foley catheter (9/8).    - Failed trial of void 9/10    - Start flomax today    - Plan to f/u urology as outpatient for next trial of void    - Monitor SCr        #Anemia    #Polycythemia    # Iron Deficiency Anemia    Hgb 18.2 and Hct 59.9 possibly 2/2 COPD vs possible OSA. Can consider empiric  treatment of OSA with trial of CPAP if patient desaturates overnight. Iron  studies significant for Iron Deficiency anemia Iron 47, %Fe Saturation 12,  Ferritin 68, Transferrin 270. 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    - IV Venofer iso heart failure    - Consider outpatient workup (JAK2 testing and BM)    - Lower Extremity Ultrasound negative for DVT    - Outpatient sleep study        #Cholestatic Liver Enzymes -- Elevated Alk Phos and GGT. Unknown if patient  has Hx of cirrhosis although with normal AST/ALT, less likely, but would  prefer inpatient imaging to assess. Eyes appear slightly icteric (9/6) PM  without significant elevation of Bilirubin.    - RUQ Ultrasound  (9/6) Impression noted Massive distention of the urinary  bladder with estimated volume of 1.5 L and Unremarkable appearance of the  liver        #Constipation    Pt has hx of constipation and reports constipation on day of admission for the  past several days    - Lactulose    - Senna BID    - cont home miralax and colace        #DM    On Metformin outpatient. Episode of hypoglycemia to 36 (9/3) AM. D/Ced Lantus  and 4u Lispro    - A1c 7.5    - SSI TIDWM    - hold home metformin        Chronic:    - Tremor: Patient has baseline upper extremity tremors with home psychiatric  medications    - Schizoaffective disorder: Continue home Carbamazepine 400 mg qam and 600 mg  qhs, Mirtazapine 15 mg qhs, Zyprexa 10 mg qhs, Seroquel 300 mg qhs    - HLD: Continue home atorvastatin        Full code (confirmed)    HH Diet  SQ heparin    Daughter: Hulda Humphrey: [phone]    Social: Pt lives in a group home.            **Electronically signed by Arnetha Gula, MD on 01/21/2020 12:54**        **Electronically signed by Arnetha Gula, MD on 01/21/2020 12:54**

## 2020-01-10 NOTE — Progress Notes (Signed)
 **Subjective**    Subjective Overnight:    - No Acute Events overnight        This AM:    - Patient appeared more at baseline this mornign when speaking to him. He was  about to start eating breakfast and endorsed feeling hungry    - Denies chest pain, nausea, vomiting, diarrhea, and headache.    Tolerating Diet Yes.    **Subjective**    Subjective Overnight:    - No Acute Events overnight        This AM:    - Patient appeared more at baseline this mornign when speaking to him. He was  about to start eating breakfast and endorsed feeling hungry    - Denies chest pain, nausea, vomiting, diarrhea, and headache.    Tolerating Diet Yes.    **Subjective**    Subjective Overnight:    - No Acute Events overnight        This AM:    - Patient appeared more at baseline this mornign when speaking to him. He was  about to start eating breakfast and endorsed feeling hungry    - Denies chest pain, nausea, vomiting, diarrhea, and headache.    Tolerating Diet Yes.    **ROS**    Complete Review of Systems All other systems reviewed and negative except as  noted in the HPI.    **ROS**    Complete Review of Systems All other systems reviewed and negative except as  noted in the HPI.    **ROS**    Complete Review of Systems All other systems reviewed and negative except as  noted in the HPI.    **Exam**    Comment Temp 38, HR 90s, BP 90s-130s/70s-80s, 2L NC with intermittent BiPAP    Weight: 79.6 (9/6) -> 76.4 (9/7)        Gen: NAD, Lying in bed    HEENT: PERRL, unable to evaluate for JVD given body habitus +Mild Scleral  Icterus    CV: RRR, no murmurs, rubs, gallops    Lung: +Diffuse Bilateral Crackles with worsening wheezing    Abd: Soft, Nontender, no rebound, no guarding +Distended, +BS    Ext: WWP +Bilateral lower extremity edema to the shins Right > Left, decreased  from prior but trace edema present    Neuro: +intermittent tremor of upper/lower extremities +5/5 Extremity  strengths.      Vital Signs    01/17/2020 11:30               -  Temperature: 38H (35-37.8Cel)          -  Site: Oral          -  Heart Rate: 93H (60-90)          -  Site: Monitor          -  BP: 99/85 (90-140/60-90)          -  Method: Automated          -  BP #2: 91/73 (90-140/60-90)          -  Method: Automated          -  Respirations: 18 (12-20)          -  O2 Saturation (%): 96          -  O2 Amount: 2.0 LPM          -  O2 Delivery Method: Nasal Cannula          -  MEWS Vital Sign Score: 2      01/16/2020 22:19              -  Morse Fall Risk Total: 60      01/16/2020 19:43              -  Site: Right Arm          -  Position: Lying      01/16/2020 09:09              -  How Obtained: Bed Scale          -  Weight: 76.4kg      CFS Vital Signs CS    01/17/2020 14:59              -  Shift Intake Total: 0ml          -  Shift Output Total:          -  Shift Balance: -          I have reviewed and agree with vital signs as listed in the EMR Yes Time  01/17/2020 12:28.    **Exam**    Comment Temp 38, HR 90s, BP 90s-130s/70s-80s, 2L NC with intermittent BiPAP    Weight: 79.6 (9/6) -> 76.4 (9/7)        Gen: NAD, Lying in bed    HEENT: PERRL, unable to evaluate for JVD given body habitus +Mild Scleral  Icterus    CV: RRR, no murmurs, rubs, gallops    Lung: +Diffuse Bilateral Crackles with worsening wheezing    Abd: Soft, Nontender, no rebound, no guarding +Distended, +BS    Ext: WWP +Bilateral lower extremity edema to the shins Right > Left, decreased  from prior but trace edema present    Neuro: +intermittent tremor of upper/lower extremities +5/5 Extremity  strengths.      Vital Signs    01/17/2020 11:30              -  Temperature: 38H (35-37.8Cel)          -  Site: Oral          -  Heart Rate: 93H (60-90)          -  Site: Monitor          -  BP: 99/85 (90-140/60-90)          -  Method: Automated          -  BP #2: 91/73 (90-140/60-90)          -  Method: Automated          -  Respirations: 18 (12-20)           -  O2 Saturation (%): 96          -  O2 Amount: 2.0 LPM          -  O2 Delivery Method: Nasal Cannula          -  MEWS Vital Sign Score: 2      01/16/2020 22:19              -  Morse Fall Risk Total: 60      01/16/2020 19:43              -  Site: Right Arm          -  Position: Lying      01/16/2020 09:09              -  How Obtained: Bed Scale          -  Weight: 76.4kg      CFS Vital Signs CS    01/17/2020 14:59              -  Shift Intake Total: 0ml          -  Shift Output Total:          -  Shift Balance: -          I have reviewed and agree with vital signs as listed in the EMR Yes Time  01/17/2020 12:28.    **Exam**    Comment Temp 38, HR 90s, BP 90s-130s/70s-80s, 2L NC with intermittent BiPAP    Weight: 79.6 (9/6) -> 76.4 (9/7)        Gen: NAD, Lying in bed    HEENT: PERRL, unable to evaluate for JVD given body habitus +Mild Scleral  Icterus    CV: RRR, no murmurs, rubs, gallops    Lung: +Diffuse Bilateral Crackles with worsening wheezing    Abd: Soft, Nontender, no rebound, no guarding +Distended, +BS    Ext: WWP +Bilateral lower extremity edema to the shins Right > Left, decreased  from prior but trace edema present    Neuro: +intermittent tremor of upper/lower extremities +5/5 Extremity  strengths.      Vital Signs    01/17/2020 11:30              -  Temperature: 38H (35-37.8Cel)          -  Site: Oral          -  Heart Rate: 93H (60-90)          -  Site: Monitor          -  BP: 99/85 (90-140/60-90)          -  Method: Automated          -  BP #2: 91/73 (90-140/60-90)          -  Method: Automated          -  Respirations: 18 (12-20)          -  O2 Saturation (%): 96          -  O2 Amount: 2.0 LPM          -  O2 Delivery Method: Nasal Cannula          -  MEWS Vital Sign Score: 2      01/16/2020 22:19              -  Morse Fall Risk Total: 60      01/16/2020 19:43              -  Site: Right Arm          -  Position: Lying       01/16/2020 09:09              -  How Obtained: Bed Scale          -  Weight: 76.4kg      CFS Vital Signs CS    01/17/2020 14:59              -  Shift Intake Total: 0ml          -  Shift Output Total:          -  Shift Balance: -  I have reviewed and agree with vital signs as listed in the EMR Yes Time  01/17/2020 12:28.    **Labs**    All current labs have been reviewed by myself as of 01/17/2020 12:28.    **Labs**    All current labs have been reviewed by myself as of 01/17/2020 12:28.    **Labs**    All current labs have been reviewed by myself as of 01/17/2020 12:28.    **Radiology**    All current radiographs have been reviewed by myself as of 01/17/2020 12:28.    **Radiology**    All current radiographs have been reviewed by myself as of 01/17/2020 12:28.    **Radiology**    All current radiographs have been reviewed by myself as of 01/17/2020 12:28.    **Assessment and Plan**    Medication              -   **LACTULOSE (CEPHULAC)** 20 G Oral BID for 60 Days           -   **IPRATROPIUM/ALBUTEROL SULFATE (DUONEB)** 3 ML Inhalation Q4H for 60 Days           -   **PREDNISONE (DELTASONE)** 40 MG Oral QDAY First Dose Now for 60 Days           -   **CEFTRIAXONE (ROCEPHIN)** 1 G Intravenous Q24H First Dose Now for 60 Days, Clinician ZOX:WRUEAV TO 100 ML NS MED PLUS. INFUSE OVER 30 MIN.           -   **GUAIFENESIN SYRUP** 200 MG Oral Q4HPRN COUGH for 60 Days           -   **HYDRALAZINE (APRESOLINE)** 50 MG Oral TID First Dose Now for 60 Days, Clinician Dir:SEE ORDER #55           -   **METOPROLOL (LOPRESSOR)** 12.5 MG Oral BID First Dose Now for 60 Days           -   **REMOVE PATCH** 1 PATCH Miscellaneous QDAY for 60 Days, Clinician Dir:OLD NICOTINE PATCH           -   **PATCH INTACT QSHIFT (PATCH INTACT INSPECTION)** 1 PATCH Topical CHECK for 60 Days, Clinician WUJ:[id number] (NICOTINE) PATCH EVERY SHIFT           -   **NICOTINE TRANSDERMAL (HABITROL TRANSDERMAL)** 21 MG Topical QDAY  First Dose Now for 60 Days           -   **ASPIRIN CHEWABLE** 81 MG Oral QDAY for 60 Days           -   **CARBAMAZEPINE (TEGRETOL)** 400 MG Oral QAM for 60 Days           -   ** ** ** **INSULIN LISPRO (HUMALOG) (HUMALOG)******** Sliding Scale Subcutaneous TIDWM for 60 Days      For Glucose 80 to 150 Give 0 Units    For Glucose 151 to 200 Give 2 Units    For Glucose 201 to 250 Give 3 Units    For Glucose 251 to 300 Give 4 Units    Notify Physician if: Call HO for glucose > 300          -   **DORZOLAMIDE 2% OPHTH (TRUSOPT 2% OPHTH)** 1 DROP Both Eyes SOL BID for 60 Days           -   **BUDESONIDE INHALATION (PULMICORT RESPULES)** 0.25 MG Inhalation BID for 60 Days, Clinician WGN:[id number] FOR TRELEGY SEE  ORDER #[id number]           -   **MIRTAZAPINE (REMERON)** 15 MG Oral QHS for 60 Days           -   **CARBAMAZEPINE (TEGRETOL)** 600 MG Oral QHS for 60 Days           -   **HYDROCORTISONE 1% CREAM** 1 APP Topical BID for 60 Days, Clinician Dir:SEE EXTENDED INSTRUCTIONS INTERCHANGE FOR 2.5%           -   **ATORVASTATIN (LIPITOR)** 20 MG Oral QHS for 60 Days           -   **SENNA (SENOKOT)** 8.6 MG Oral BID for 60 Days           -   **BRIMONIDINE 0.2% OPHTH (ALPHAGAN 0.2% OPHTH)** 1 DROP Both Eyes SOL BID for 60 Days, Clinician WUX:[id number] FOR 0.15% STRENGTH           -   **OLANZAPINE (ZYPREXA)** 10 MG Oral QHS for 60 Days           -   **FORMOTEROL FUMARATE (PERFOROMIST)** 20 MCG Inhalation BID for 60 Days, Clinician DGU:[id number] FOR TRELEGY SEE ORDER #31           -   **DOCUSATE SODIUM (COLACE)** 100 MG Oral BID for 60 Days           -   **QUETIAPINE FUMARATE (SEROQUEL)** 400 MG Oral QHS for 60 Days           -   **GLUCAGON** 1 MG Intramuscular PRN BG < 60 AND NO IV ACCESS for 60 Days, Clinician Dir:MAY BE GIVEN IM OR SUB-Q           -   **DEXTROSE 50% SYRINGE (DEXTROSE 50%)** 12.5 G Intravenous PRN BG < 60 AND CAN NOT TAKE PO for 60 Days           -   **BISACODYL (DULCOLAX)** 10 MG  Rectal QDAYPRN CONSTIPATION for 60 Days           -   **HEPARIN** 5000 UNITS Subcutaneous Q12H for 60 Days           -   **IPRATROPIUM/ALBUTEROL SULFATE (DUONEB)** 3 ML Inhalation Q4HPRN SOB/WHEEZING for 60 Days           -   **ALBUTEROL INHALER (VENTOLIN HFA)** 2 PUFF Inhalation Q4HPRN SHORTNESS OF BREATH for 60 Days           -   **ACETAMINOPHEN (TYLENOL)** 650 MG Oral Q6HPRN PAIN for 60 Days           -   **POLYETHYLENE GLYCOL (MIRALAX)** 17 G Oral QDAYPRN CONSTIPATION for 60 Days           -   **AMMONIUM LACTATE 12% LOTION (LAC-HYDRIN 12%)** 1 APP Topical QDAYPRN RASH for 60 Days           -   **BENZONATATE** 100 MG Oral TIDPRN COUGH for 60 Days           Comment Mr. Collier Bohnet is a 54 yo male with history of HFrEF (EF 30%  05/2019), COPD not on home O2, HTN, DM, and, Schizoaffective disorder  presenting from a group home for shortness of breath.        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  History of previous admissions for CHF exacerbation requiring IV  Diuresis. Previous dry weight of 75kg with most recent weight 81kg in the ED.  Etiology of exacerbation unclear, but probable medication non-adherence given  poot historian.    PRELOAD: Continued fluid overloaded on exam with lower extremity edema and CXR  notable for increased pulmonary vascular congestion with left pleural effusion    - Hold Home 40mg  Lasix PO iso low SBPs w/ Tmax 38.0 -- Net Negative -- Unable  to elucidate exact output given multiple catheterizations; however patient has  output at least 2L in 24H    --- Goal 0.5-1.0 L negative as patient approaching euvolemia    - Strict I&O, Daily standing weights    -- CXR (9/6) without evidence of worsening pulmonary edema or pulmonary  vascular congestion    PUMP    Echo 09/02 with normal sized LV with normal LV thickness and severely reduced  LV systolic  function (EF 20-25%), Flattened septum consistent with RV  pressure/volume overload, Severe global hypokinesis of LV; Right Ventrible  moderate to severely dilated with severely reduced function, trace MR, severe  TR    - Hold Metroprolol at low dose 12.5mg  bid for possible sepsis given Tmax 38.0  (9/8) and SBPs 90s    - Repeat inpatient TTE as closer to euvolemia    AFTERLOAD    - Hydralazine 50mg  TID    CORONARIES    - Continue home ASA and Atorvastatin 80mg     Neurohormonal: Previously discontinued Spironolactone for hyperkalemia in the  setting of K-sparing diuretics    -- Can consider Spironolactone for GDMT if hyperkalemia stable        #Acute COPD Exacerbation - PFTS (07/21/19) FEV1 24% with FEV1/FVC 40% GOLD IV.  Pulm previously wanted outpatient CT Chest, as patient is inpatient and being  actively diuresed with changes in mental status, will go ahead and pursue  inpatient CT Chest. SSA/SSB, RF per previous Pulm note Negative    - Vancomycin Dose by Level (9/7 - ) -- Tmax 38.0, BCx drawn, and SBPs in 90s    - Cefepime 1g Q24H (9/8 - )    - s/p Ceftriaxone 1g Q24H (9/7-9/8)    - Sputum Cx Pending    - Prednisone 40mg  qd 5 days (9/7 - ), (s/p 9/1-9/5)    ---- s/p Azithromycin 500mg  IV Q24H (9/2-3)    -- Duonebs Q4H    -- Pulmonary c/s -- recs appreciated    -- Replete KCl PO -- replete Cl to support CO2 lowering -- D/C Diamox    ---> Goal K 4.5-5.0    -- Repeat TTE when euvolemic    -- Cont. BiPAP and Duonebs        #AKI    Cr 1.85 on admission, baseline Cr possibly ~1.2-1.3. AKI probable cardiorenal  in the setting of CHF exacerbation, 1.67 (9/5), will decrease magnitude of  diuresis as patient is approaching a euvolemic state. Creatinine (9/5) 1.83 ->  2.02 -> 1.99 (9/6) -> 2.55 (9/7). Etiology of AKI most likely 2/2 prerenal  given continued diuresis, possibly massive diuresis of negative 6L took time  to be reflected in his kidney function vs. new urinary retention as found on  RUQ Ultrasound (9/7).  Patient cathed 2x overnight with over 1.5L out. Will  place foley catheter (9/8) to allow continuous post-obstruction alleviation.    - CT A/P w/ PO Contrast    - Foley Catheter    - Monitor SCr    -  Hold Diuresis        #Pulmonay Nodules - CT Chest Impression (9/7) noted new innumerable bilateral  pulmonary nodules are concerning for metastasis. CT of the abdomen and pelvis  with IV contrast is recommended for further evaluation with a small focal  lucency in the manubrium of the sternum could represent a lytic metastasis.  Given possible metastatis, will look for primary cancer site    - CT A/P        #Altered Mental Status -- (9/6) morning patient was obtunded and responsive to  sternal rub. Sent patient for CT Head to rule out intracranial pathology.  Returned significantly improved mental status, but had some right upper  extremity rythmic jerking. Neuro was consulted and do not believe he was  having a focal seizure and without focal neurologic deficits. Possibly  secondary to medication, but unsure why it is worsening during his admission  as we've improved his fluid status. Improved towards baseline on (9/7) and  will continue BiPAP. NH3 79 on (9/7) with elevated Alk Phos, GGT, and LDH  without transaminitis or hyperbilirubinemia.    - Lactulose -- promote bowel movement and decrease serum NH3    - BiPAP as needed    - Neuro c/s -- recs appreciated    -- No acute interventions        #Anemia    #Polycythemia    # Iron Deficiency Anemia    Hgb 18.2 and Hct 59.9 possibly 2/2 COPD vs possible OSA. Can consider empiric  treatment of OSA with trial of CPAP if patient desaturates overnight. Iron  studies significant for Iron Deficiency anemia Iron 47, %Fe Saturation 12,  Ferritin 68, Transferrin 270. 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    - IV Venofer iso heart failure    - Consider outpatient workup (JAK2 testing and BM)    - Lower Extremity Ultrasound  negative for DVT    - outpatient sleep study        #Cholestatic Liver Enzymes -- Elevated Alk Phos and GGT. Unknown if patient  has Hx of cirrhosis although with normal AST/ALT, less likely, but would  prefer inpatient imaging to assess. Eyes appear slightly icteric (9/6) PM  without significant elevation of Bilirubin.    - RUQ Ultrasound (9/6) Impression noted Massive distention of the urinary  bladder with estimated volume of 1.5 L and Unremarkable appearance of the  liver    -- Foley Catheter for urinary retention        #Tremor -- likely 2/2 to medication as it was present on admission with normal  glucose levels. (9/3) AM patient was hypoglycemic to 36, resolution of  hypoglycemia improved baseline tremor    -- Carbamazepine Level (9/6) and (9/8)    -- Monitor blood glucose, insulin changes as detailed below    -- continue outpatient psychiatric medications        #Constipation    Pt has hx of constipation and reports constipation on day of admission for the  past several days    - Lactulose    - Senna BID    - cont home miralax and colace        #DM    On Metformin outpatient. Episode of hypoglycemia to 36 (9/3) AM. D/Ced Lantus  and 4u Lispro    - A1c 7.5    - SSI TIDWM    - hold home metformin        Chronic:    -  Schizoaffective disorder: Continue home Carbamazepine 400 mg qam and 600 mg  qhs, Mirtazapine 15 mg qhs, Zyprexa 10 mg qhs, Seroquel 300 mg qhs    - HLD: Continue home atorvastatin        #Social: Pt lives in a group home        Full code (confirmed)    HH Diet    SQ heparin.    **Assessment and Plan**    Medication              -   **LACTULOSE (CEPHULAC)** 20 G Oral BID for 60 Days           -   **IPRATROPIUM/ALBUTEROL SULFATE (DUONEB)** 3 ML Inhalation Q4H for 60 Days           -   **PREDNISONE (DELTASONE)** 40 MG Oral QDAY First Dose Now for 60 Days           -   **CEFTRIAXONE (ROCEPHIN)** 1 G Intravenous Q24H First Dose Now for 60 Days, Clinician GUR:[id number] TO 100 ML NS MED PLUS.  INFUSE OVER 30 MIN.           -   **GUAIFENESIN SYRUP** 200 MG Oral Q4HPRN COUGH for 60 Days           -   **HYDRALAZINE (APRESOLINE)** 50 MG Oral TID First Dose Now for 60 Days, Clinician Dir:SEE ORDER #55           -   **METOPROLOL (LOPRESSOR)** 12.5 MG Oral BID First Dose Now for 60 Days           -   **REMOVE PATCH** 1 PATCH Miscellaneous QDAY for 60 Days, Clinician Dir:OLD NICOTINE PATCH           -   **PATCH INTACT QSHIFT (PATCH INTACT INSPECTION)** 1 PATCH Topical CHECK for 60 Days, Clinician BJS:[id number] (NICOTINE) PATCH EVERY SHIFT           -   **NICOTINE TRANSDERMAL (HABITROL TRANSDERMAL)** 21 MG Topical QDAY First Dose Now for 60 Days           -   **ASPIRIN CHEWABLE** 81 MG Oral QDAY for 60 Days           -   **CARBAMAZEPINE (TEGRETOL)** 400 MG Oral QAM for 60 Days           -   ** ** ** **INSULIN LISPRO (HUMALOG) (HUMALOG)******** Sliding Scale Subcutaneous TIDWM for 60 Days      For Glucose 80 to 150 Give 0 Units    For Glucose 151 to 200 Give 2 Units    For Glucose 201 to 250 Give 3 Units    For Glucose 251 to 300 Give 4 Units    Notify Physician if: Call HO for glucose > 300          -   **DORZOLAMIDE 2% OPHTH (TRUSOPT 2% OPHTH)** 1 DROP Both Eyes SOL BID for 60 Days           -   **BUDESONIDE INHALATION (PULMICORT RESPULES)** 0.25 MG Inhalation BID for 60 Days, Clinician OHY:[id number] FOR TRELEGY SEE ORDER #31           -   **MIRTAZAPINE (REMERON)** 15 MG Oral QHS for 60 Days           -   **CARBAMAZEPINE (TEGRETOL)** 600 MG Oral QHS for 60 Days           -   **HYDROCORTISONE 1% CREAM** 1 APP Topical  BID for 60 Days, Clinician Dir:SEE EXTENDED INSTRUCTIONS INTERCHANGE FOR 2.5%           -   **ATORVASTATIN (LIPITOR)** 20 MG Oral QHS for 60 Days           -   **SENNA (SENOKOT)** 8.6 MG Oral BID for 60 Days           -   **BRIMONIDINE 0.2% OPHTH (ALPHAGAN 0.2% OPHTH)** 1 DROP Both Eyes SOL BID for 60 Days, Clinician UEA:[id number] FOR 0.15% STRENGTH           -    **OLANZAPINE (ZYPREXA)** 10 MG Oral QHS for 60 Days           -   **FORMOTEROL FUMARATE (PERFOROMIST)** 20 MCG Inhalation BID for 60 Days, Clinician WGN:[id number] FOR TRELEGY SEE ORDER #31           -   **DOCUSATE SODIUM (COLACE)** 100 MG Oral BID for 60 Days           -   **QUETIAPINE FUMARATE (SEROQUEL)** 400 MG Oral QHS for 60 Days           -   **GLUCAGON** 1 MG Intramuscular PRN BG < 60 AND NO IV ACCESS for 60 Days, Clinician Dir:MAY BE GIVEN IM OR SUB-Q           -   **DEXTROSE 50% SYRINGE (DEXTROSE 50%)** 12.5 G Intravenous PRN BG < 60 AND CAN NOT TAKE PO for 60 Days           -   **BISACODYL (DULCOLAX)** 10 MG Rectal QDAYPRN CONSTIPATION for 60 Days           -   **HEPARIN** 5000 UNITS Subcutaneous Q12H for 60 Days           -   **IPRATROPIUM/ALBUTEROL SULFATE (DUONEB)** 3 ML Inhalation Q4HPRN SOB/WHEEZING for 60 Days           -   **ALBUTEROL INHALER (VENTOLIN HFA)** 2 PUFF Inhalation Q4HPRN SHORTNESS OF BREATH for 60 Days           -   **ACETAMINOPHEN (TYLENOL)** 650 MG Oral Q6HPRN PAIN for 60 Days           -   **POLYETHYLENE GLYCOL (MIRALAX)** 17 G Oral QDAYPRN CONSTIPATION for 60 Days           -   **AMMONIUM LACTATE 12% LOTION (LAC-HYDRIN 12%)** 1 APP Topical QDAYPRN RASH for 60 Days           -   **BENZONATATE** 100 MG Oral TIDPRN COUGH for 60 Days           Comment Mr. Keane Martelli is a 54 yo male with history of HFrEF (EF 30%  05/2019), COPD not on home O2, HTN, DM, and, Schizoaffective disorder  presenting from a group home for shortness of breath.        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 History of previous admissions for CHF exacerbation requiring IV  Diuresis. Previous dry weight of 75kg with most recent weight 81kg in the ED.  Etiology of exacerbation unclear, but probable medication non-adherence given  poot historian.    PRELOAD:  Continued fluid overloaded on exam with lower extremity edema and CXR  notable for increased pulmonary vascular congestion with left pleural effusion    -  Hold Home 40mg  Lasix PO iso low SBPs w/ Tmax 38.0 -- Net Negative -- Unable  to elucidate exact output given multiple catheterizations; however patient has  output at least 2L in 24H    --- Goal 0.5-1.0 L negative as patient approaching euvolemia    - Strict I&O, Daily standing weights    -- CXR (9/6) without evidence of worsening pulmonary edema or pulmonary  vascular congestion    PUMP    Echo 09/02 with normal sized LV with normal LV thickness and severely reduced  LV systolic function (EF 20-25%), Flattened septum consistent with RV  pressure/volume overload, Severe global hypokinesis of LV; Right Ventrible  moderate to severely dilated with severely reduced function, trace MR, severe  TR    - Hold Metroprolol at low dose 12.5mg  bid for possible sepsis given Tmax 38.0  (9/8) and SBPs 90s    - Repeat inpatient TTE as closer to euvolemia    AFTERLOAD    - Hydralazine 50mg  TID    CORONARIES    - Continue home ASA and Atorvastatin 80mg     Neurohormonal: Previously discontinued Spironolactone for hyperkalemia in the  setting of K-sparing diuretics    -- Can consider Spironolactone for GDMT if hyperkalemia stable        #Acute COPD Exacerbation - PFTS (07/21/19) FEV1 24% with FEV1/FVC 40% GOLD IV.  Pulm previously wanted outpatient CT Chest, as patient is inpatient and being  actively diuresed with changes in mental status, will go ahead and pursue  inpatient CT Chest. SSA/SSB, RF per previous Pulm note Negative    - Vancomycin Dose by Level (9/7 - ) -- Tmax 38.0, BCx drawn, and SBPs in 90s    - Cefepime 1g Q24H (9/8 - )    - s/p Ceftriaxone 1g Q24H (9/7-9/8)    - Sputum Cx Pending    - Prednisone 40mg  qd 5 days (9/7 - ), (s/p 9/1-9/5)    ---- s/p Azithromycin 500mg  IV Q24H (9/2-3)    -- Duonebs Q4H    -- Pulmonary c/s -- recs appreciated    -- Replete KCl PO  -- replete Cl to support CO2 lowering -- D/C Diamox    ---> Goal K 4.5-5.0    -- Repeat TTE when euvolemic    -- Cont. BiPAP and Duonebs        #AKI    Cr 1.85 on admission, baseline Cr possibly ~1.2-1.3. AKI probable cardiorenal  in the setting of CHF exacerbation, 1.67 (9/5), will decrease magnitude of  diuresis as patient is approaching a euvolemic state. Creatinine (9/5) 1.83 ->  2.02 -> 1.99 (9/6) -> 2.55 (9/7). Etiology of AKI most likely 2/2 prerenal  given continued diuresis, possibly massive diuresis of negative 6L took time  to be reflected in his kidney function vs. new urinary retention as found on  RUQ Ultrasound (9/7). Patient cathed 2x overnight with over 1.5L out. Will  place foley catheter (9/8) to allow continuous post-obstruction alleviation.    - CT A/P w/ PO Contrast    - Foley Catheter    - Monitor SCr    - Hold Diuresis        #Pulmonay Nodules - CT Chest Impression (9/7) noted new innumerable bilateral  pulmonary nodules are concerning for metastasis. CT of the abdomen and pelvis  with IV contrast is recommended for further evaluation with a small focal  lucency in the manubrium of the sternum could represent a lytic metastasis.  Given possible metastatis, will look  for primary cancer site    - CT A/P        #Altered Mental Status -- (9/6) morning patient was obtunded and responsive to  sternal rub. Sent patient for CT Head to rule out intracranial pathology.  Returned significantly improved mental status, but had some right upper  extremity rythmic jerking. Neuro was consulted and do not believe he was  having a focal seizure and without focal neurologic deficits. Possibly  secondary to medication, but unsure why it is worsening during his admission  as we've improved his fluid status. Improved towards baseline on (9/7) and  will continue BiPAP. NH3 79 on (9/7) with elevated Alk Phos, GGT, and LDH  without transaminitis or hyperbilirubinemia.    - Lactulose -- promote bowel movement and  decrease serum NH3    - BiPAP as needed    - Neuro c/s -- recs appreciated    -- No acute interventions        #Anemia    #Polycythemia    # Iron Deficiency Anemia    Hgb 18.2 and Hct 59.9 possibly 2/2 COPD vs possible OSA. Can consider empiric  treatment of OSA with trial of CPAP if patient desaturates overnight. Iron  studies significant for Iron Deficiency anemia Iron 47, %Fe Saturation 12,  Ferritin 68, Transferrin 270. 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    - IV Venofer iso heart failure    - Consider outpatient workup (JAK2 testing and BM)    - Lower Extremity Ultrasound negative for DVT    - outpatient sleep study        #Cholestatic Liver Enzymes -- Elevated Alk Phos and GGT. Unknown if patient  has Hx of cirrhosis although with normal AST/ALT, less likely, but would  prefer inpatient imaging to assess. Eyes appear slightly icteric (9/6) PM  without significant elevation of Bilirubin.    - RUQ Ultrasound (9/6) Impression noted Massive distention of the urinary  bladder with estimated volume of 1.5 L and Unremarkable appearance of the  liver    -- Foley Catheter for urinary retention        #Tremor -- likely 2/2 to medication as it was present on admission with normal  glucose levels. (9/3) AM patient was hypoglycemic to 36, resolution of  hypoglycemia improved baseline tremor    -- Carbamazepine Level (9/6) and (9/8)    -- Monitor blood glucose, insulin changes as detailed below    -- continue outpatient psychiatric medications        #Constipation    Pt has hx of constipation and reports constipation on day of admission for the  past several days    - Lactulose    - Senna BID    - cont home miralax and colace        #DM    On Metformin outpatient. Episode of hypoglycemia to 36 (9/3) AM. D/Ced Lantus  and 4u Lispro    - A1c 7.5    - SSI TIDWM    - hold home metformin        Chronic:    - Schizoaffective disorder: Continue home Carbamazepine 400 mg qam  and 600 mg  qhs, Mirtazapine 15 mg qhs, Zyprexa 10 mg qhs, Seroquel 300 mg qhs    - HLD: Continue home atorvastatin        #Social: Pt lives in a group home        Full code (confirmed)    HH  Diet    SQ heparin.    **Assessment and Plan**    Medication              -   **LACTULOSE (CEPHULAC)** 20 G Oral BID for 60 Days           -   **IPRATROPIUM/ALBUTEROL SULFATE (DUONEB)** 3 ML Inhalation Q4H for 60 Days           -   **PREDNISONE (DELTASONE)** 40 MG Oral QDAY First Dose Now for 60 Days           -   **CEFTRIAXONE (ROCEPHIN)** 1 G Intravenous Q24H First Dose Now for 60 Days, Clinician ZOX:WRUEAV TO 100 ML NS MED PLUS. INFUSE OVER 30 MIN.           -   **GUAIFENESIN SYRUP** 200 MG Oral Q4HPRN COUGH for 60 Days           -   **HYDRALAZINE (APRESOLINE)** 50 MG Oral TID First Dose Now for 60 Days, Clinician Dir:SEE ORDER #[id number]           -   **METOPROLOL (LOPRESSOR)** 12.5 MG Oral BID First Dose Now for 60 Days           -   **REMOVE PATCH** 1 PATCH Miscellaneous QDAY for 60 Days, Clinician Dir:OLD NICOTINE PATCH           -   **PATCH INTACT QSHIFT (PATCH INTACT INSPECTION)** 1 PATCH Topical CHECK for 60 Days, Clinician WUJ:[id number] (NICOTINE) PATCH EVERY SHIFT           -   **NICOTINE TRANSDERMAL (HABITROL TRANSDERMAL)** 21 MG Topical QDAY First Dose Now for 60 Days           -   **ASPIRIN CHEWABLE** 81 MG Oral QDAY for 60 Days           -   **CARBAMAZEPINE (TEGRETOL)** 400 MG Oral QAM for 60 Days           -   ** ** ** **INSULIN LISPRO (HUMALOG) (HUMALOG)******** Sliding Scale Subcutaneous TIDWM for 60 Days      For Glucose 80 to 150 Give 0 Units    For Glucose 151 to 200 Give 2 Units    For Glucose 201 to 250 Give 3 Units    For Glucose 251 to 300 Give 4 Units    Notify Physician if: Call HO for glucose > 300          -   **DORZOLAMIDE 2% OPHTH (TRUSOPT 2% OPHTH)** 1 DROP Both Eyes SOL BID for 60 Days           -   **BUDESONIDE INHALATION (PULMICORT RESPULES)** 0.25 MG Inhalation BID for  60 Days, Clinician WGN:[id number] FOR TRELEGY SEE ORDER #[id number]           -   **MIRTAZAPINE (REMERON)** 15 MG Oral QHS for 60 Days           -   **CARBAMAZEPINE (TEGRETOL)** 600 MG Oral QHS for 60 Days           -   **HYDROCORTISONE 1% CREAM** 1 APP Topical BID for 60 Days, Clinician Dir:SEE EXTENDED INSTRUCTIONS INTERCHANGE FOR 2.5%           -   **ATORVASTATIN (LIPITOR)** 20 MG Oral QHS for 60 Days           -   **SENNA (SENOKOT)** 8.6 MG Oral BID for 60 Days           -   **  BRIMONIDINE 0.2% OPHTH (ALPHAGAN 0.2% OPHTH)** 1 DROP Both Eyes SOL BID for 60 Days, Clinician SNK:[id number] FOR 0.15% STRENGTH           -   **OLANZAPINE (ZYPREXA)** 10 MG Oral QHS for 60 Days           -   **FORMOTEROL FUMARATE (PERFOROMIST)** 20 MCG Inhalation BID for 60 Days, Clinician XTK:[id number] FOR TRELEGY SEE ORDER #[id number]           -   **DOCUSATE SODIUM (COLACE)** 100 MG Oral BID for 60 Days           -   **QUETIAPINE FUMARATE (SEROQUEL)** 400 MG Oral QHS for 60 Days           -   **GLUCAGON** 1 MG Intramuscular PRN BG < 60 AND NO IV ACCESS for 60 Days, Clinician Dir:MAY BE GIVEN IM OR SUB-Q           -   **DEXTROSE 50% SYRINGE (DEXTROSE 50%)** 12.5 G Intravenous PRN BG < 60 AND CAN NOT TAKE PO for 60 Days           -   **BISACODYL (DULCOLAX)** 10 MG Rectal QDAYPRN CONSTIPATION for 60 Days           -   **HEPARIN** 5000 UNITS Subcutaneous Q12H for 60 Days           -   **IPRATROPIUM/ALBUTEROL SULFATE (DUONEB)** 3 ML Inhalation Q4HPRN SOB/WHEEZING for 60 Days           -   **ALBUTEROL INHALER (VENTOLIN HFA)** 2 PUFF Inhalation Q4HPRN SHORTNESS OF BREATH for 60 Days           -   **ACETAMINOPHEN (TYLENOL)** 650 MG Oral Q6HPRN PAIN for 60 Days           -   **POLYETHYLENE GLYCOL (MIRALAX)** 17 G Oral QDAYPRN CONSTIPATION for 60 Days           -   **AMMONIUM LACTATE 12% LOTION (LAC-HYDRIN 12%)** 1 APP Topical QDAYPRN RASH for 60 Days           -   **BENZONATATE** 100 MG Oral TIDPRN COUGH for 60 Days            Comment [name] is a 54 yo male with history of HFrEF (EF 30%  05/2019), COPD not on home O2, HTN, DM, and, Schizoaffective disorder  presenting from a group home for shortness of breath.        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 History of previous admissions for CHF exacerbation requiring IV  Diuresis. Previous dry weight of 75kg with most recent weight 81kg in the ED.  Etiology of exacerbation unclear, but probable medication non-adherence given  poot historian.    PRELOAD: Continued fluid overloaded on exam with lower extremity edema and CXR  notable for increased pulmonary vascular congestion with left pleural effusion    - Hold Home 40mg  Lasix PO iso low SBPs w/ Tmax 38.0 -- Net Negative -- Unable  to elucidate exact output given multiple catheterizations; however patient has  output at least 2L in 24H    --- Goal 0.5-1.0 L negative as patient approaching euvolemia    - Strict I&O, Daily standing weights    -- CXR (9/6) without evidence of worsening pulmonary  edema or pulmonary  vascular congestion    PUMP    Echo 09/02 with normal sized LV with normal LV thickness and severely reduced  LV systolic function (EF 20-25%), Flattened septum consistent with RV  pressure/volume overload, Severe global hypokinesis of LV; Right Ventrible  moderate to severely dilated with severely reduced function, trace MR, severe  TR    - Hold Metroprolol at low dose 12.5mg  bid for possible sepsis given Tmax 38.0  (9/8) and SBPs 90s    - Repeat inpatient TTE as closer to euvolemia    AFTERLOAD    - Hydralazine 50mg  TID    CORONARIES    - Continue home ASA and Atorvastatin 80mg     Neurohormonal: Previously discontinued Spironolactone for hyperkalemia in the  setting of K-sparing diuretics    -- Can consider Spironolactone for GDMT if hyperkalemia stable        #Acute COPD  Exacerbation - PFTS (07/21/19) FEV1 24% with FEV1/FVC 40% GOLD IV.  Pulm previously wanted outpatient CT Chest, as patient is inpatient and being  actively diuresed with changes in mental status, will go ahead and pursue  inpatient CT Chest. SSA/SSB, RF per previous Pulm note Negative    - Vancomycin Dose by Level (9/7 - ) -- Tmax 38.0, BCx drawn, and SBPs in 90s    - Cefepime 1g Q24H (9/8 - )    - s/p Ceftriaxone 1g Q24H (9/7-9/8)    - Sputum Cx Pending    - Prednisone 40mg  qd 5 days (9/7 - ), (s/p 9/1-9/5)    ---- s/p Azithromycin 500mg  IV Q24H (9/2-3)    -- Duonebs Q4H    -- Pulmonary c/s -- recs appreciated    -- Replete KCl PO -- replete Cl to support CO2 lowering -- D/C Diamox    ---> Goal K 4.5-5.0    -- Repeat TTE when euvolemic    -- Cont. BiPAP and Duonebs        #AKI    Cr 1.85 on admission, baseline Cr possibly ~1.2-1.3. AKI probable cardiorenal  in the setting of CHF exacerbation, 1.67 (9/5), will decrease magnitude of  diuresis as patient is approaching a euvolemic state. Creatinine (9/5) 1.83 ->  2.02 -> 1.99 (9/6) -> 2.55 (9/7). Etiology of AKI most likely 2/2 prerenal  given continued diuresis, possibly massive diuresis of negative 6L took time  to be reflected in his kidney function vs. new urinary retention as found on  RUQ Ultrasound (9/7). Patient cathed 2x overnight with over 1.5L out. Will  place foley catheter (9/8) to allow continuous post-obstruction alleviation.    - CT A/P w/ PO Contrast    - Foley Catheter    - Monitor SCr    - Hold Diuresis        #Pulmonay Nodules - CT Chest Impression (9/7) noted new innumerable bilateral  pulmonary nodules are concerning for metastasis. CT of the abdomen and pelvis  with IV contrast is recommended for further evaluation with a small focal  lucency in the manubrium of the sternum could represent a lytic metastasis.  Given possible metastatis, will look for primary cancer site    - CT A/P        #Altered Mental Status -- (9/6) morning patient was  obtunded and responsive to  sternal rub. Sent patient for CT Head to rule out intracranial pathology.  Returned significantly improved mental status, but had some right upper  extremity rythmic jerking. Neuro was consulted and do not believe he  was  having a focal seizure and without focal neurologic deficits. Possibly  secondary to medication, but unsure why it is worsening during his admission  as we've improved his fluid status. Improved towards baseline on (9/7) and  will continue BiPAP. NH3 79 on (9/7) with elevated Alk Phos, GGT, and LDH  without transaminitis or hyperbilirubinemia.    - Lactulose -- promote bowel movement and decrease serum NH3    - BiPAP as needed    - Neuro c/s -- recs appreciated    -- No acute interventions        #Anemia    #Polycythemia    # Iron Deficiency Anemia    Hgb 18.2 and Hct 59.9 possibly 2/2 COPD vs possible OSA. Can consider empiric  treatment of OSA with trial of CPAP if patient desaturates overnight. Iron  studies significant for Iron Deficiency anemia Iron 47, %Fe Saturation 12,  Ferritin 68, Transferrin 270. 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    - IV Venofer iso heart failure    - Consider outpatient workup (JAK2 testing and BM)    - Lower Extremity Ultrasound negative for DVT    - outpatient sleep study        #Cholestatic Liver Enzymes -- Elevated Alk Phos and GGT. Unknown if patient  has Hx of cirrhosis although with normal AST/ALT, less likely, but would  prefer inpatient imaging to assess. Eyes appear slightly icteric (9/6) PM  without significant elevation of Bilirubin.    - RUQ Ultrasound (9/6) Impression noted Massive distention of the urinary  bladder with estimated volume of 1.5 L and Unremarkable appearance of the  liver    -- Foley Catheter for urinary retention        #Tremor -- likely 2/2 to medication as it was present on admission with normal  glucose levels. (9/3) AM patient was hypoglycemic to 36,  resolution of  hypoglycemia improved baseline tremor    -- Carbamazepine Level (9/6) and (9/8)    -- Monitor blood glucose, insulin changes as detailed below    -- continue outpatient psychiatric medications        #Constipation    Pt has hx of constipation and reports constipation on day of admission for the  past several days    - Lactulose    - Senna BID    - cont home miralax and colace        #DM    On Metformin outpatient. Episode of hypoglycemia to 36 (9/3) AM. D/Ced Lantus  and 4u Lispro    - A1c 7.5    - SSI TIDWM    - hold home metformin        Chronic:    - Schizoaffective disorder: Continue home Carbamazepine 400 mg qam and 600 mg  qhs, Mirtazapine 15 mg qhs, Zyprexa 10 mg qhs, Seroquel 300 mg qhs    - HLD: Continue home atorvastatin        #Social: Pt lives in a group home        Full code (confirmed)    HH Diet    SQ heparin.            **Electronically signed by Laurelyn Sickle, MD on 01/17/2020 15:40**        **Electronically signed by Laurelyn Sickle, MD on 01/17/2020 15:40**        **Electronically signed by Laurelyn Sickle, MD on 01/17/2020 15:40**

## 2020-01-10 NOTE — Progress Notes (Signed)
 **Subjective**    Subjective Overnight:    - No Acute Events overnight        This AM:    - Very happy to see doctor this morning and in a good mood    - Denies chest pain, nausea, vomiting, diarrhea, and headache.    Tolerating Diet Yes.    **Subjective**    Subjective Overnight:    - No Acute Events overnight        This AM:    - Very happy to see doctor this morning and in a good mood    - Denies chest pain, nausea, vomiting, diarrhea, and headache.    Tolerating Diet Yes.    **Subjective**    Subjective Overnight:    - No Acute Events overnight        This AM:    - Very happy to see doctor this morning and in a good mood    - Denies chest pain, nausea, vomiting, diarrhea, and headache.    Tolerating Diet Yes.    **ROS**    Complete Review of Systems All other systems reviewed and negative except as  noted in the HPI.    **ROS**    Complete Review of Systems All other systems reviewed and negative except as  noted in the HPI.    **ROS**    Complete Review of Systems All other systems reviewed and negative except as  noted in the HPI.    **Exam**    Comment Weight: 79.6 (9/6) -> 76.4 (9/7) -> 74.8 (9/9) -> 75.16        Gen: NAD, Lying in bed    HEENT: PERRL    CV: RRR, no murmurs, rubs, gallops    Lung: mild bilateral expiratory wheezing    Abd: Soft, Nontender, no rebound, no guarding    Ext: trace bilateral LE edema, significantly improved from previous  examination    Neuro: no focal deficits .      Vital Signs    01/19/2020 11:44              -  Temperature: 37 (35-37.8Cel)          -  Site: Oral          -  Heart Rate: 89 (60-90)          -  Site: Monitor          -  BP: 129/80 (90-140/60-90)          -  Site: Left Arm          -  Position: Sitting          -  Method: Automated          -  Respirations: 18 (12-20)          -  O2 Saturation (%): 96          -  O2 Delivery Method: Room Air          -  MEWS Vital Sign Score: 0      01/19/2020 10:52              -  Morse Fall Risk  Total: 85      01/19/2020 06:35              -  How Obtained: Stand. Scale          -  Weight: 75.16kg      01/18/2020 09:29              -  O2 Amount: 2.0 LPM      CFS Vital Signs CS    01/19/2020 14:59              -  Shift Intake Total:          -  Shift Output Total:          -  Shift Balance:          I have reviewed and agree with vital signs as listed in the EMR Yes Time  01/19/2020 12:55.    **Exam**    Comment Weight: 79.6 (9/6) -> 76.4 (9/7) -> 74.8 (9/9) -> 75.16        Gen: NAD, Lying in bed    HEENT: PERRL    CV: RRR, no murmurs, rubs, gallops    Lung: mild bilateral expiratory wheezing    Abd: Soft, Nontender, no rebound, no guarding    Ext: trace bilateral LE edema, significantly improved from previous  examination    Neuro: no focal deficits .      Vital Signs    01/19/2020 11:44              -  Temperature: 37 (35-37.8Cel)          -  Site: Oral          -  Heart Rate: 89 (60-90)          -  Site: Monitor          -  BP: 129/80 (90-140/60-90)          -  Site: Left Arm          -  Position: Sitting          -  Method: Automated          -  Respirations: 18 (12-20)          -  O2 Saturation (%): 96          -  O2 Delivery Method: Room Air          -  MEWS Vital Sign Score: 0      01/19/2020 10:52              -  Morse Fall Risk Total: 85      01/19/2020 06:35              -  How Obtained: Stand. Scale          -  Weight: 75.16kg      01/18/2020 09:29              -  O2 Amount: 2.0 LPM      CFS Vital Signs CS    01/19/2020 14:59              -  Shift Intake Total:          -  Shift Output Total:          -  Shift Balance:          I have reviewed and agree with vital signs as listed in the EMR Yes Time  01/19/2020 12:55.    **Exam**    Comment Weight: 79.6 (9/6) -> 76.4 (9/7) -> 74.8 (9/9) -> 75.16        Gen: NAD, Lying in bed    HEENT: PERRL    CV: RRR, no murmurs, rubs, gallops    Lung: mild bilateral expiratory  wheezing  Abd: Soft, Nontender, no rebound, no guarding    Ext: trace bilateral LE edema, significantly improved from previous  examination    Neuro: no focal deficits .      Vital Signs    01/19/2020 11:44              -  Temperature: 37 (35-37.8Cel)          -  Site: Oral          -  Heart Rate: 89 (60-90)          -  Site: Monitor          -  BP: 129/80 (90-140/60-90)          -  Site: Left Arm          -  Position: Sitting          -  Method: Automated          -  Respirations: 18 (12-20)          -  O2 Saturation (%): 96          -  O2 Delivery Method: Room Air          -  MEWS Vital Sign Score: 0      01/19/2020 10:52              -  Morse Fall Risk Total: 85      01/19/2020 06:35              -  How Obtained: Stand. Scale          -  Weight: 75.16kg      01/18/2020 09:29              -  O2 Amount: 2.0 LPM      CFS Vital Signs CS    01/19/2020 14:59              -  Shift Intake Total:          -  Shift Output Total:          -  Shift Balance:          I have reviewed and agree with vital signs as listed in the EMR Yes Time  01/19/2020 12:55.    **Labs**    All current labs have been reviewed by myself as of 01/19/2020 12:55.    **Labs**    All current labs have been reviewed by myself as of 01/19/2020 12:55.    **Labs**    All current labs have been reviewed by myself as of 01/19/2020 12:55.    **Radiology**    All current radiographs have been reviewed by myself as of 01/19/2020 12:55.    **Radiology**    All current radiographs have been reviewed by myself as of 01/19/2020 12:55.    **Radiology**    All current radiographs have been reviewed by myself as of 01/19/2020 12:55.    **Assessment and Plan**    Medication              -   **HYDRALAZINE (APRESOLINE)** 25 MG Oral TID for 56 Days, Clinician PPI:RJJO SBP <90S   Pending Date: 01/19/2020          -   **METOPROLOL XL (TOPROL XL)** 75 MG Oral QDAY for 60 Days           -   **CEFEPIME (MAXIPIME)** 1  G Intravenous Q8H First Dose Now  for 9 Doses, Clinician Dir:HAP/VAP/HCAP PROTOCOL. ATTACH TO 100 ML NS MED PLUS. INFUSE FIRST DOSE OVER 30 MINUTES AND ALL SUBSEQUENT DOSES OVER 3 HOURS.           -   **LACTULOSE (CEPHULAC)** 20 G Oral BID for 60 Days           -   **IPRATROPIUM/ALBUTEROL SULFATE (DUONEB)** 3 ML Inhalation Q4H for 60 Days           -   **PREDNISONE (DELTASONE)** 40 MG Oral QDAY First Dose Now for 60 Days           -   **GUAIFENESIN SYRUP** 200 MG Oral Q4HPRN COUGH for 60 Days           -   **REMOVE PATCH** 1 PATCH Miscellaneous QDAY for 60 Days, Clinician Dir:OLD NICOTINE PATCH           -   **PATCH INTACT QSHIFT (PATCH INTACT INSPECTION)** 1 PATCH Topical CHECK for 60 Days, Clinician TKZ:[id number] (NICOTINE) PATCH EVERY SHIFT           -   **NICOTINE TRANSDERMAL (HABITROL TRANSDERMAL)** 21 MG Topical QDAY First Dose Now for 60 Days           -   **ASPIRIN CHEWABLE** 81 MG Oral QDAY for 60 Days           -   **CARBAMAZEPINE (TEGRETOL)** 400 MG Oral QAM for 60 Days           -   ** ** ** **INSULIN LISPRO (HUMALOG) (HUMALOG)******** Sliding Scale Subcutaneous TIDWM for 60 Days      For Glucose 80 to 150 Give 0 Units    For Glucose 151 to 200 Give 2 Units    For Glucose 201 to 250 Give 3 Units    For Glucose 251 to 300 Give 4 Units    Notify Physician if: Call HO for glucose > 300          -   **FORMOTEROL FUMARATE (PERFOROMIST)** 20 MCG Inhalation BID for 60 Days, Clinician TFT:[id number] FOR TRELEGY SEE ORDER #31           -   **BUDESONIDE INHALATION (PULMICORT RESPULES)** 0.25 MG Inhalation BID for 60 Days, Clinician WCB:[id number] FOR TRELEGY SEE ORDER #31           -   **SENNA (SENOKOT)** 8.6 MG Oral BID for 60 Days           -   **DOCUSATE SODIUM (COLACE)** 100 MG Oral BID for 60 Days           -   **QUETIAPINE FUMARATE (SEROQUEL)** 400 MG Oral QHS for 60 Days           -   **HYDROCORTISONE 1% CREAM** 1 APP Topical BID for 60 Days, Clinician Dir:SEE EXTENDED  INSTRUCTIONS INTERCHANGE FOR 2.5%           -   **ATORVASTATIN (LIPITOR)** 20 MG Oral QHS for 60 Days           -   **BRIMONIDINE 0.2% OPHTH (ALPHAGAN 0.2% OPHTH)** 1 DROP Both Eyes SOL BID for 60 Days, Clinician YWV:[id number] FOR 0.15% STRENGTH           -   **DORZOLAMIDE 2% OPHTH (TRUSOPT 2% OPHTH)** 1 DROP Both Eyes SOL BID for 60 Days           -   **OLANZAPINE (ZYPREXA)** 10 MG Oral QHS for 60 Days           -   **  MIRTAZAPINE (REMERON)** 15 MG Oral QHS for 60 Days           -   **CARBAMAZEPINE (TEGRETOL)** 600 MG Oral QHS for 60 Days           -   **DEXTROSE 50% SYRINGE (DEXTROSE 50%)** 12.5 G Intravenous PRN BG < 60 AND CAN NOT TAKE PO for 60 Days           -   **GLUCAGON** 1 MG Intramuscular PRN BG < 60 AND NO IV ACCESS for 60 Days, Clinician Dir:MAY BE GIVEN IM OR SUB-Q           -   **BISACODYL (DULCOLAX)** 10 MG Rectal QDAYPRN CONSTIPATION for 60 Days           -   **HEPARIN** 5000 UNITS Subcutaneous Q12H for 60 Days           -   **IPRATROPIUM/ALBUTEROL SULFATE (DUONEB)** 3 ML Inhalation Q4HPRN SOB/WHEEZING for 60 Days           -   **BENZONATATE** 100 MG Oral TIDPRN COUGH for 60 Days           -   **AMMONIUM LACTATE 12% LOTION (LAC-HYDRIN 12%)** 1 APP Topical QDAYPRN RASH for 60 Days           -   **ALBUTEROL INHALER (VENTOLIN HFA)** 2 PUFF Inhalation Q4HPRN SHORTNESS OF BREATH for 60 Days           -   **ACETAMINOPHEN (TYLENOL)** 650 MG Oral Q6HPRN PAIN for 60 Days           -   **POLYETHYLENE GLYCOL (MIRALAX)** 17 G Oral QDAYPRN CONSTIPATION for 60 Days           Comment [name] is a 54 yo male with history of HFrEF (EF 30%  05/2019), COPD not on home O2, HTN, DM, and, Schizoaffective disorder  presenting from a group home for shortness of breath.        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 History of previous admissions for CHF  exacerbation requiring IV  Diuresis. Previous dry weight of 75kg with most recent weight 81kg in the ED.  Etiology of exacerbation unclear, but probable medication non-adherence given  poot historian. Spoke to patient with his daughter Donnie Aho) present on (9/9)  regarding CHF/COPD exacerbation and possible malignancy with subsequent steps  -- voiced understanding and that she would be the most appropriate HCP.    PRELOAD: Continued fluid overloaded on exam with lower extremity edema and CXR  notable for increased pulmonary vascular congestion with left pleural effusion    - Home 40mg  Lasix PO -- Hold tomorrow? (9/11) iso euovolemia on exam today --  will reexamine    --- Goal 0.5 - 1.0L negative as patient approaching euvolemia -- Net: -2.7L  (9/9)    - Strict I&O, Daily standing weights    -- CXR (9/6) without evidence of worsening pulmonary edema or pulmonary  vascular congestion    PUMP    Echo 09/02 with normal sized LV with normal LV thickness and severely reduced  LV systolic function (EF 20-25%), Flattened septum consistent with RV  pressure/volume overload, Severe global hypokinesis of LV; Right Ventricle  moderate to severely dilated with severely reduced function, trace MR, severe  TR    -  Metroprolol 75mg  XL qd    - Repeat inpatient TTE as closer to euvolemia    AFTERLOAD    - Hydralazine 25mg  TID    CORONARIES    - Continue home ASA and Atorvastatin 80mg     Neurohormonal: Previously discontinued Spironolactone for hyperkalemia in the  setting of K-sparing diuretics    -- Can consider Spironolactone for GDMT if hyperkalemia stable        #Acute COPD Exacerbation - PFTS (07/21/19) FEV1 24% with FEV1/FVC 40% GOLD IV.  Pulm previously wanted outpatient CT Chest, as patient is inpatient and being  actively diuresed with changes in mental status, will go ahead and pursue  inpatient CT Chest. SSA/SSB, RF per previous Pulm note Negative. Tmax 38.0,  BCx drawn (NGTD), and SBPs in 90s. Afebrile as of (9/9) without  leukocytosis.  Pseudomonas on sputum Cx    - Ciprofloxacin (9/11 - ) -- Per AMT, appropriate    - Cefepime 1g Q24H (9/8 - 9/11)    - s/p Vancomycin Dose by Level (9/7 - 9/9)    - s/p Ceftriaxone 1g Q24H (9/7-9/8)    - Sputum Cx - Pseudomonas    - Prednisone 40mg  qd 5 days (9/7 - ), (s/p 9/1-9/5)    ---- s/p Azithromycin 500mg  IV Q24H (9/2-3)    -- Duonebs Q4H    -- Pulmonary c/s -- recs appreciated    - continue prednisone 40 mg daily; decrease by 10 mg every 7 days until off    - we will follow CT abdomen; ongoing discussions regarding inpatient bronch  pending those results    - continue LABA/LAMA and ICS; PRN duonebs    - do not anticipate RHC for this patient given poor functional status and  poor candidacy for PHTN therapies    - definitive biopsy would likely be a VATS wedge -- no primary tumor on CT  A/P    - oxygen goal > 88%; wean oxygen as tolerated    ---> Goal K 4.5-5.0    -- Repeat TTE when euvolemic    -- Cont. BiPAP and Duonebs        #AKI    Cr 1.85 on admission, baseline Cr possibly ~1.2-1.3. AKI probable cardiorenal  in the setting of CHF exacerbation, 1.67 (9/5), will decrease magnitude of  diuresis as patient is approaching a euvolemic state. Creatinine (9/5) 1.83 ->  2.02 -> 1.99 (9/6) -> 2.55 (9/7) -> 1.92 (9/9). Etiology of AKI most likely  2/2 new urinary retention as found on RUQ Ultrasound (9/7), less likely  prerenal given continued diuresis, possibly massive diuresis of negative 6L  took time to be reflected in his kidney function. Patient cathed 2x overnight  with over 1.5L out. Placed foley catheter (9/8) to allow continuous post-  obstruction alleviation. CT A/P w/ PO Contrast Impression noted no definite  evidence of malignancy or infection is identified in the abdomen and pelvis.  Enlarged and heterogenous prostate. PSA WNL.    - Foley Catheter -- Remove (9/10) with voiding trial and bladder scan  following void or 6 hours after    - Monitor SCr    - Home 40mg  PO Lasix         #Pulmonay Nodules - CT Chest Impression (9/7) noted new innumerable bilateral  pulmonary nodules are concerning for metastasis. CT of the abdomen and pelvis  with IV contrast is recommended for further evaluation with a small focal  lucency in the manubrium of the sternum could represent a lytic  metastasis.  Given possible metastatis, will look for primary cancer site    - Definitive biopsy would likely be a VATS wedge -- no primary tumor on CT  A/P        #Altered Mental Status -- (9/6) morning patient was obtunded and responsive to  sternal rub. Sent patient for CT Head to rule out intracranial pathology.  Returned significantly improved mental status, but had some right upper  extremity rythmic jerking. Neuro was consulted and do not believe he was  having a focal seizure and without focal neurologic deficits. Possibly  secondary to medication, but unsure why it is worsening during his admission  as we've improved his fluid status. Improved towards baseline on (9/7) and  will continue BiPAP. NH3 79 on (9/7) with elevated Alk Phos, GGT, and LDH  without transaminitis or hyperbilirubinemia.    - Lactulose -- promote bowel movement and decrease serum NH3    - BiPAP as needed    - Neuro c/s -- recs appreciated    -- No acute interventions        #Anemia    #Polycythemia    # Iron Deficiency Anemia    Hgb 18.2 and Hct 59.9 possibly 2/2 COPD vs possible OSA. Can consider empiric  treatment of OSA with trial of CPAP if patient desaturates overnight. Iron  studies significant for Iron Deficiency anemia Iron 47, %Fe Saturation 12,  Ferritin 68, Transferrin 270. 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    - IV Venofer iso heart failure    - Consider outpatient workup (JAK2 testing and BM)    - Lower Extremity Ultrasound negative for DVT    - Outpatient sleep study        #Cholestatic Liver Enzymes -- Elevated Alk Phos and GGT. Unknown if patient  has Hx of cirrhosis  although with normal AST/ALT, less likely, but would  prefer inpatient imaging to assess. Eyes appear slightly icteric (9/6) PM  without significant elevation of Bilirubin.    - RUQ Ultrasound (9/6) Impression noted Massive distention of the urinary  bladder with estimated volume of 1.5 L and Unremarkable appearance of the  liver        #Tremor -- likely 2/2 to medication as it was present on admission with normal  glucose levels. (9/3) AM patient was hypoglycemic to 36, resolution of  hypoglycemia improved baseline tremor    -- Carbamazepine Level (9/6) and (9/8) - WNL    -- Monitor blood glucose, insulin changes as detailed below    -- continue outpatient psychiatric medications        #Constipation    Pt has hx of constipation and reports constipation on day of admission for the  past several days    - Lactulose    - Senna BID    - cont home miralax and colace        #DM    On Metformin outpatient. Episode of hypoglycemia to 36 (9/3) AM. D/Ced Lantus  and 4u Lispro    - A1c 7.5    - SSI TIDWM    - hold home metformin        Chronic:    - Schizoaffective disorder: Continue home Carbamazepine 400 mg qam and 600 mg  qhs, Mirtazapine 15 mg qhs, Zyprexa 10 mg qhs, Seroquel 300 mg qhs    - HLD: Continue home atorvastatin        #Social: Pt lives in a group home  Full code (confirmed)    HH Diet    SQ heparin    Daughter: Donnie Aho Keo: [phone].    **Assessment and Plan**    Medication              -   **HYDRALAZINE (APRESOLINE)** 25 MG Oral TID for 56 Days, Clinician TFT:DDUK SBP <90S   Pending Date: 01/19/2020          -   **METOPROLOL XL (TOPROL XL)** 75 MG Oral QDAY for 60 Days           -   **CEFEPIME (MAXIPIME)** 1 G Intravenous Q8H First Dose Now for 9 Doses, Clinician Dir:HAP/VAP/HCAP PROTOCOL. ATTACH TO 100 ML NS MED PLUS. INFUSE FIRST DOSE OVER 30 MINUTES AND ALL SUBSEQUENT DOSES OVER 3 HOURS.           -   **LACTULOSE (CEPHULAC)** 20 G Oral BID for 60 Days           -    **IPRATROPIUM/ALBUTEROL SULFATE (DUONEB)** 3 ML Inhalation Q4H for 60 Days           -   **PREDNISONE (DELTASONE)** 40 MG Oral QDAY First Dose Now for 60 Days           -   **GUAIFENESIN SYRUP** 200 MG Oral Q4HPRN COUGH for 60 Days           -   **REMOVE PATCH** 1 PATCH Miscellaneous QDAY for 60 Days, Clinician Dir:OLD NICOTINE PATCH           -   **PATCH INTACT QSHIFT (PATCH INTACT INSPECTION)** 1 PATCH Topical CHECK for 60 Days, Clinician GUR:[id number] (NICOTINE) PATCH EVERY SHIFT           -   **NICOTINE TRANSDERMAL (HABITROL TRANSDERMAL)** 21 MG Topical QDAY First Dose Now for 60 Days           -   **ASPIRIN CHEWABLE** 81 MG Oral QDAY for 60 Days           -   **CARBAMAZEPINE (TEGRETOL)** 400 MG Oral QAM for 60 Days           -   ** ** ** **INSULIN LISPRO (HUMALOG) (HUMALOG)******** Sliding Scale Subcutaneous TIDWM for 60 Days      For Glucose 80 to 150 Give 0 Units    For Glucose 151 to 200 Give 2 Units    For Glucose 201 to 250 Give 3 Units    For Glucose 251 to 300 Give 4 Units    Notify Physician if: Call HO for glucose > 300          -   **FORMOTEROL FUMARATE (PERFOROMIST)** 20 MCG Inhalation BID for 60 Days, Clinician JSE:[id number] FOR TRELEGY SEE ORDER #31           -   **BUDESONIDE INHALATION (PULMICORT RESPULES)** 0.25 MG Inhalation BID for 60 Days, Clinician PXT:[id number] FOR TRELEGY SEE ORDER #31           -   **SENNA (SENOKOT)** 8.6 MG Oral BID for 60 Days           -   **DOCUSATE SODIUM (COLACE)** 100 MG Oral BID for 60 Days           -   **QUETIAPINE FUMARATE (SEROQUEL)** 400 MG Oral QHS for 60 Days           -   **HYDROCORTISONE 1% CREAM** 1 APP Topical BID for 60 Days, Clinician Dir:SEE EXTENDED INSTRUCTIONS INTERCHANGE FOR  2.5%           -   **ATORVASTATIN (LIPITOR)** 20 MG Oral QHS for 60 Days           -   **BRIMONIDINE 0.2% OPHTH (ALPHAGAN 0.2% OPHTH)** 1 DROP Both Eyes SOL BID for 60 Days, Clinician OZD:[id number] FOR 0.15% STRENGTH           -    **DORZOLAMIDE 2% OPHTH (TRUSOPT 2% OPHTH)** 1 DROP Both Eyes SOL BID for 60 Days           -   **OLANZAPINE (ZYPREXA)** 10 MG Oral QHS for 60 Days           -   **MIRTAZAPINE (REMERON)** 15 MG Oral QHS for 60 Days           -   **CARBAMAZEPINE (TEGRETOL)** 600 MG Oral QHS for 60 Days           -   **DEXTROSE 50% SYRINGE (DEXTROSE 50%)** 12.5 G Intravenous PRN BG < 60 AND CAN NOT TAKE PO for 60 Days           -   **GLUCAGON** 1 MG Intramuscular PRN BG < 60 AND NO IV ACCESS for 60 Days, Clinician Dir:MAY BE GIVEN IM OR SUB-Q           -   **BISACODYL (DULCOLAX)** 10 MG Rectal QDAYPRN CONSTIPATION for 60 Days           -   **HEPARIN** 5000 UNITS Subcutaneous Q12H for 60 Days           -   **IPRATROPIUM/ALBUTEROL SULFATE (DUONEB)** 3 ML Inhalation Q4HPRN SOB/WHEEZING for 60 Days           -   **BENZONATATE** 100 MG Oral TIDPRN COUGH for 60 Days           -   **AMMONIUM LACTATE 12% LOTION (LAC-HYDRIN 12%)** 1 APP Topical QDAYPRN RASH for 60 Days           -   **ALBUTEROL INHALER (VENTOLIN HFA)** 2 PUFF Inhalation Q4HPRN SHORTNESS OF BREATH for 60 Days           -   **ACETAMINOPHEN (TYLENOL)** 650 MG Oral Q6HPRN PAIN for 60 Days           -   **POLYETHYLENE GLYCOL (MIRALAX)** 17 G Oral QDAYPRN CONSTIPATION for 60 Days           Comment [name] is a 54 yo male with history of HFrEF (EF 30%  05/2019), COPD not on home O2, HTN, DM, and, Schizoaffective disorder  presenting from a group home for shortness of breath.        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 History of previous admissions for CHF exacerbation requiring IV  Diuresis. Previous dry weight of 75kg with most recent weight 81kg in the ED.  Etiology of exacerbation unclear, but probable medication non-adherence given  poot historian. Spoke to patient with his daughter Donnie Aho) present on  (9/9)  regarding CHF/COPD exacerbation and possible malignancy with subsequent steps  -- voiced understanding and that she would be the most appropriate HCP.    PRELOAD: Continued fluid overloaded on exam with lower extremity edema and CXR  notable for increased pulmonary vascular congestion with left pleural effusion    -  Home 40mg  Lasix PO -- Hold tomorrow? (9/11) iso euovolemia on exam today --  will reexamine    --- Goal 0.5 - 1.0L negative as patient approaching euvolemia -- Net: -2.7L  (9/9)    - Strict I&O, Daily standing weights    -- CXR (9/6) without evidence of worsening pulmonary edema or pulmonary  vascular congestion    PUMP    Echo 09/02 with normal sized LV with normal LV thickness and severely reduced  LV systolic function (EF 20-25%), Flattened septum consistent with RV  pressure/volume overload, Severe global hypokinesis of LV; Right Ventricle  moderate to severely dilated with severely reduced function, trace MR, severe  TR    - Metroprolol 75mg  XL qd    - Repeat inpatient TTE as closer to euvolemia    AFTERLOAD    - Hydralazine 25mg  TID    CORONARIES    - Continue home ASA and Atorvastatin 80mg     Neurohormonal: Previously discontinued Spironolactone for hyperkalemia in the  setting of K-sparing diuretics    -- Can consider Spironolactone for GDMT if hyperkalemia stable        #Acute COPD Exacerbation - PFTS (07/21/19) FEV1 24% with FEV1/FVC 40% GOLD IV.  Pulm previously wanted outpatient CT Chest, as patient is inpatient and being  actively diuresed with changes in mental status, will go ahead and pursue  inpatient CT Chest. SSA/SSB, RF per previous Pulm note Negative. Tmax 38.0,  BCx drawn (NGTD), and SBPs in 90s. Afebrile as of (9/9) without leukocytosis.  Pseudomonas on sputum Cx    - Ciprofloxacin (9/11 - ) -- Per AMT, appropriate    - Cefepime 1g Q24H (9/8 - 9/11)    - s/p Vancomycin Dose by Level (9/7 - 9/9)    - s/p Ceftriaxone 1g Q24H (9/7-9/8)    - Sputum Cx - Pseudomonas    -  Prednisone 40mg  qd 5 days (9/7 - ), (s/p 9/1-9/5)    ---- s/p Azithromycin 500mg  IV Q24H (9/2-3)    -- Duonebs Q4H    -- Pulmonary c/s -- recs appreciated    - continue prednisone 40 mg daily; decrease by 10 mg every 7 days until off    - we will follow CT abdomen; ongoing discussions regarding inpatient bronch  pending those results    - continue LABA/LAMA and ICS; PRN duonebs    - do not anticipate RHC for this patient given poor functional status and  poor candidacy for PHTN therapies    - definitive biopsy would likely be a VATS wedge -- no primary tumor on CT  A/P    - oxygen goal > 88%; wean oxygen as tolerated    ---> Goal K 4.5-5.0    -- Repeat TTE when euvolemic    -- Cont. BiPAP and Duonebs        #AKI    Cr 1.85 on admission, baseline Cr possibly ~1.2-1.3. AKI probable cardiorenal  in the setting of CHF exacerbation, 1.67 (9/5), will decrease magnitude of  diuresis as patient is approaching a euvolemic state. Creatinine (9/5) 1.83 ->  2.02 -> 1.99 (9/6) -> 2.55 (9/7) -> 1.92 (9/9). Etiology of AKI most likely  2/2 new urinary retention as found on RUQ Ultrasound (9/7), less likely  prerenal given continued diuresis, possibly massive diuresis of negative 6L  took time to be reflected in his kidney function. Patient cathed 2x overnight  with over 1.5L out. Placed foley catheter (9/8) to allow continuous post-  obstruction alleviation. CT A/P  w/ PO Contrast Impression noted no definite  evidence of malignancy or infection is identified in the abdomen and pelvis.  Enlarged and heterogenous prostate. PSA WNL.    - Foley Catheter -- Remove (9/10) with voiding trial and bladder scan  following void or 6 hours after    - Monitor SCr    - Home 40mg  PO Lasix        #Pulmonay Nodules - CT Chest Impression (9/7) noted new innumerable bilateral  pulmonary nodules are concerning for metastasis. CT of the abdomen and pelvis  with IV contrast is recommended for further evaluation with a small focal  lucency in the  manubrium of the sternum could represent a lytic metastasis.  Given possible metastatis, will look for primary cancer site    - Definitive biopsy would likely be a VATS wedge -- no primary tumor on CT  A/P        #Altered Mental Status -- (9/6) morning patient was obtunded and responsive to  sternal rub. Sent patient for CT Head to rule out intracranial pathology.  Returned significantly improved mental status, but had some right upper  extremity rythmic jerking. Neuro was consulted and do not believe he was  having a focal seizure and without focal neurologic deficits. Possibly  secondary to medication, but unsure why it is worsening during his admission  as we've improved his fluid status. Improved towards baseline on (9/7) and  will continue BiPAP. NH3 79 on (9/7) with elevated Alk Phos, GGT, and LDH  without transaminitis or hyperbilirubinemia.    - Lactulose -- promote bowel movement and decrease serum NH3    - BiPAP as needed    - Neuro c/s -- recs appreciated    -- No acute interventions        #Anemia    #Polycythemia    # Iron Deficiency Anemia    Hgb 18.2 and Hct 59.9 possibly 2/2 COPD vs possible OSA. Can consider empiric  treatment of OSA with trial of CPAP if patient desaturates overnight. Iron  studies significant for Iron Deficiency anemia Iron 47, %Fe Saturation 12,  Ferritin 68, Transferrin 270. 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    - IV Venofer iso heart failure    - Consider outpatient workup (JAK2 testing and BM)    - Lower Extremity Ultrasound negative for DVT    - Outpatient sleep study        #Cholestatic Liver Enzymes -- Elevated Alk Phos and GGT. Unknown if patient  has Hx of cirrhosis although with normal AST/ALT, less likely, but would  prefer inpatient imaging to assess. Eyes appear slightly icteric (9/6) PM  without significant elevation of Bilirubin.    - RUQ Ultrasound (9/6) Impression noted Massive distention of the  urinary  bladder with estimated volume of 1.5 L and Unremarkable appearance of the  liver        #Tremor -- likely 2/2 to medication as it was present on admission with normal  glucose levels. (9/3) AM patient was hypoglycemic to 36, resolution of  hypoglycemia improved baseline tremor    -- Carbamazepine Level (9/6) and (9/8) - WNL    -- Monitor blood glucose, insulin changes as detailed below    -- continue outpatient psychiatric medications        #Constipation    Pt has hx of constipation and reports constipation on day of admission for the  past several days    - Lactulose    -  Senna BID    - cont home miralax and colace        #DM    On Metformin outpatient. Episode of hypoglycemia to 36 (9/3) AM. D/Ced Lantus  and 4u Lispro    - A1c 7.5    - SSI TIDWM    - hold home metformin        Chronic:    - Schizoaffective disorder: Continue home Carbamazepine 400 mg qam and 600 mg  qhs, Mirtazapine 15 mg qhs, Zyprexa 10 mg qhs, Seroquel 300 mg qhs    - HLD: Continue home atorvastatin        #Social: Pt lives in a group home        Full code (confirmed)    HH Diet    SQ heparin    Daughter: Donnie Aho Keo: [phone].    **Assessment and Plan**    Medication              -   **HYDRALAZINE (APRESOLINE)** 25 MG Oral TID for 56 Days, Clinician GMW:NUUV SBP <90S   Pending Date: 01/19/2020          -   **METOPROLOL XL (TOPROL XL)** 75 MG Oral QDAY for 60 Days           -   **CEFEPIME (MAXIPIME)** 1 G Intravenous Q8H First Dose Now for 9 Doses, Clinician Dir:HAP/VAP/HCAP PROTOCOL. ATTACH TO 100 ML NS MED PLUS. INFUSE FIRST DOSE OVER 30 MINUTES AND ALL SUBSEQUENT DOSES OVER 3 HOURS.           -   **LACTULOSE (CEPHULAC)** 20 G Oral BID for 60 Days           -   **IPRATROPIUM/ALBUTEROL SULFATE (DUONEB)** 3 ML Inhalation Q4H for 60 Days           -   **PREDNISONE (DELTASONE)** 40 MG Oral QDAY First Dose Now for 60 Days           -   **GUAIFENESIN SYRUP** 200 MG Oral Q4HPRN COUGH for 60 Days           -   **REMOVE  PATCH** 1 PATCH Miscellaneous QDAY for 60 Days, Clinician Dir:OLD NICOTINE PATCH           -   **PATCH INTACT QSHIFT (PATCH INTACT INSPECTION)** 1 PATCH Topical CHECK for 60 Days, Clinician OZD:[id number] (NICOTINE) PATCH EVERY SHIFT           -   **NICOTINE TRANSDERMAL (HABITROL TRANSDERMAL)** 21 MG Topical QDAY First Dose Now for 60 Days           -   **ASPIRIN CHEWABLE** 81 MG Oral QDAY for 60 Days           -   **CARBAMAZEPINE (TEGRETOL)** 400 MG Oral QAM for 60 Days           -   ** ** ** **INSULIN LISPRO (HUMALOG) (HUMALOG)******** Sliding Scale Subcutaneous TIDWM for 60 Days      For Glucose 80 to 150 Give 0 Units    For Glucose 151 to 200 Give 2 Units    For Glucose 201 to 250 Give 3 Units    For Glucose 251 to 300 Give 4 Units    Notify Physician if: Call HO for glucose > 300          -   **FORMOTEROL FUMARATE (PERFOROMIST)** 20 MCG Inhalation BID for 60 Days, Clinician VQQ:[id number] FOR TRELEGY SEE ORDER #31           -   **  BUDESONIDE INHALATION (PULMICORT RESPULES)** 0.25 MG Inhalation BID for 60 Days, Clinician CHE:[id number] FOR TRELEGY SEE ORDER #31           -   **SENNA (SENOKOT)** 8.6 MG Oral BID for 60 Days           -   **DOCUSATE SODIUM (COLACE)** 100 MG Oral BID for 60 Days           -   **QUETIAPINE FUMARATE (SEROQUEL)** 400 MG Oral QHS for 60 Days           -   **HYDROCORTISONE 1% CREAM** 1 APP Topical BID for 60 Days, Clinician Dir:SEE EXTENDED INSTRUCTIONS INTERCHANGE FOR 2.5%           -   **ATORVASTATIN (LIPITOR)** 20 MG Oral QHS for 60 Days           -   **BRIMONIDINE 0.2% OPHTH (ALPHAGAN 0.2% OPHTH)** 1 DROP Both Eyes SOL BID for 60 Days, Clinician IRW:[id number] FOR 0.15% STRENGTH           -   **DORZOLAMIDE 2% OPHTH (TRUSOPT 2% OPHTH)** 1 DROP Both Eyes SOL BID for 60 Days           -   **OLANZAPINE (ZYPREXA)** 10 MG Oral QHS for 60 Days           -   **MIRTAZAPINE (REMERON)** 15 MG Oral QHS for 60 Days           -   **CARBAMAZEPINE (TEGRETOL)** 600 MG  Oral QHS for 60 Days           -   **DEXTROSE 50% SYRINGE (DEXTROSE 50%)** 12.5 G Intravenous PRN BG < 60 AND CAN NOT TAKE PO for 60 Days           -   **GLUCAGON** 1 MG Intramuscular PRN BG < 60 AND NO IV ACCESS for 60 Days, Clinician Dir:MAY BE GIVEN IM OR SUB-Q           -   **BISACODYL (DULCOLAX)** 10 MG Rectal QDAYPRN CONSTIPATION for 60 Days           -   **HEPARIN** 5000 UNITS Subcutaneous Q12H for 60 Days           -   **IPRATROPIUM/ALBUTEROL SULFATE (DUONEB)** 3 ML Inhalation Q4HPRN SOB/WHEEZING for 60 Days           -   **BENZONATATE** 100 MG Oral TIDPRN COUGH for 60 Days           -   **AMMONIUM LACTATE 12% LOTION (LAC-HYDRIN 12%)** 1 APP Topical QDAYPRN RASH for 60 Days           -   **ALBUTEROL INHALER (VENTOLIN HFA)** 2 PUFF Inhalation Q4HPRN SHORTNESS OF BREATH for 60 Days           -   **ACETAMINOPHEN (TYLENOL)** 650 MG Oral Q6HPRN PAIN for 60 Days           -   **POLYETHYLENE GLYCOL (MIRALAX)** 17 G Oral QDAYPRN CONSTIPATION for 60 Days           Comment [name] is a 54 yo male with history of HFrEF (EF 30%  05/2019), COPD not on home O2, HTN, DM, and, Schizoaffective disorder  presenting from a group home for shortness of breath.        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 History of previous admissions for CHF exacerbation requiring IV  Diuresis. Previous dry weight of 75kg with most recent weight 81kg in the ED.  Etiology of exacerbation unclear, but probable medication non-adherence given  poot historian. Spoke to patient with his daughter Donnie Aho) present on (9/9)  regarding CHF/COPD exacerbation and possible malignancy with subsequent steps  -- voiced understanding and that she would be the most appropriate HCP.    PRELOAD: Continued fluid overloaded on exam with lower extremity edema and CXR  notable for increased pulmonary vascular  congestion with left pleural effusion    - Home 40mg  Lasix PO -- Hold tomorrow? (9/11) iso euovolemia on exam today --  will reexamine    --- Goal 0.5 - 1.0L negative as patient approaching euvolemia -- Net: -2.7L  (9/9)    - Strict I&O, Daily standing weights    -- CXR (9/6) without evidence of worsening pulmonary edema or pulmonary  vascular congestion    PUMP    Echo 09/02 with normal sized LV with normal LV thickness and severely reduced  LV systolic function (EF 20-25%), Flattened septum consistent with RV  pressure/volume overload, Severe global hypokinesis of LV; Right Ventricle  moderate to severely dilated with severely reduced function, trace MR, severe  TR    - Metroprolol 75mg  XL qd    - Repeat inpatient TTE as closer to euvolemia    AFTERLOAD    - Hydralazine 25mg  TID    CORONARIES    - Continue home ASA and Atorvastatin 80mg     Neurohormonal: Previously discontinued Spironolactone for hyperkalemia in the  setting of K-sparing diuretics    -- Can consider Spironolactone for GDMT if hyperkalemia stable        #Acute COPD Exacerbation - PFTS (07/21/19) FEV1 24% with FEV1/FVC 40% GOLD IV.  Pulm previously wanted outpatient CT Chest, as patient is inpatient and being  actively diuresed with changes in mental status, will go ahead and pursue  inpatient CT Chest. SSA/SSB, RF per previous Pulm note Negative. Tmax 38.0,  BCx drawn (NGTD), and SBPs in 90s. Afebrile as of (9/9) without leukocytosis.  Pseudomonas on sputum Cx    - Ciprofloxacin (9/11 - ) -- Per AMT, appropriate    - Cefepime 1g Q24H (9/8 - 9/11)    - s/p Vancomycin Dose by Level (9/7 - 9/9)    - s/p Ceftriaxone 1g Q24H (9/7-9/8)    - Sputum Cx - Pseudomonas    - Prednisone 40mg  qd 5 days (9/7 - ), (s/p 9/1-9/5)    ---- s/p Azithromycin 500mg  IV Q24H (9/2-3)    -- Duonebs Q4H    -- Pulmonary c/s -- recs appreciated    - continue prednisone 40 mg daily; decrease by 10 mg every 7 days until off    - we will follow CT abdomen; ongoing discussions  regarding inpatient bronch  pending those results    - continue LABA/LAMA and ICS; PRN duonebs    - do not anticipate RHC for this patient given poor functional status and  poor candidacy for PHTN therapies    - definitive biopsy would likely be a VATS wedge -- no primary tumor on CT  A/P    - oxygen goal > 88%; wean oxygen as tolerated    ---> Goal K 4.5-5.0    -- Repeat TTE when euvolemic    -- Cont. BiPAP and Duonebs        #  AKI    Cr 1.85 on admission, baseline Cr possibly ~1.2-1.3. AKI probable cardiorenal  in the setting of CHF exacerbation, 1.67 (9/5), will decrease magnitude of  diuresis as patient is approaching a euvolemic state. Creatinine (9/5) 1.83 ->  2.02 -> 1.99 (9/6) -> 2.55 (9/7) -> 1.92 (9/9). Etiology of AKI most likely  2/2 new urinary retention as found on RUQ Ultrasound (9/7), less likely  prerenal given continued diuresis, possibly massive diuresis of negative 6L  took time to be reflected in his kidney function. Patient cathed 2x overnight  with over 1.5L out. Placed foley catheter (9/8) to allow continuous post-  obstruction alleviation. CT A/P w/ PO Contrast Impression noted no definite  evidence of malignancy or infection is identified in the abdomen and pelvis.  Enlarged and heterogenous prostate. PSA WNL.    - Foley Catheter -- Remove (9/10) with voiding trial and bladder scan  following void or 6 hours after    - Monitor SCr    - Home 40mg  PO Lasix        #Pulmonay Nodules - CT Chest Impression (9/7) noted new innumerable bilateral  pulmonary nodules are concerning for metastasis. CT of the abdomen and pelvis  with IV contrast is recommended for further evaluation with a small focal  lucency in the manubrium of the sternum could represent a lytic metastasis.  Given possible metastatis, will look for primary cancer site    - Definitive biopsy would likely be a VATS wedge -- no primary tumor on CT  A/P        #Altered Mental Status -- (9/6) morning patient was obtunded and responsive  to  sternal rub. Sent patient for CT Head to rule out intracranial pathology.  Returned significantly improved mental status, but had some right upper  extremity rythmic jerking. Neuro was consulted and do not believe he was  having a focal seizure and without focal neurologic deficits. Possibly  secondary to medication, but unsure why it is worsening during his admission  as we've improved his fluid status. Improved towards baseline on (9/7) and  will continue BiPAP. NH3 79 on (9/7) with elevated Alk Phos, GGT, and LDH  without transaminitis or hyperbilirubinemia.    - Lactulose -- promote bowel movement and decrease serum NH3    - BiPAP as needed    - Neuro c/s -- recs appreciated    -- No acute interventions        #Anemia    #Polycythemia    # Iron Deficiency Anemia    Hgb 18.2 and Hct 59.9 possibly 2/2 COPD vs possible OSA. Can consider empiric  treatment of OSA with trial of CPAP if patient desaturates overnight. Iron  studies significant for Iron Deficiency anemia Iron 47, %Fe Saturation 12,  Ferritin 68, Transferrin 270. 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    - IV Venofer iso heart failure    - Consider outpatient workup (JAK2 testing and BM)    - Lower Extremity Ultrasound negative for DVT    - Outpatient sleep study        #Cholestatic Liver Enzymes -- Elevated Alk Phos and GGT. Unknown if patient  has Hx of cirrhosis although with normal AST/ALT, less likely, but would  prefer inpatient imaging to assess. Eyes appear slightly icteric (9/6) PM  without significant elevation of Bilirubin.    - RUQ Ultrasound (9/6) Impression noted Massive distention of the urinary  bladder with estimated volume of 1.5  L and Unremarkable appearance of the  liver        #Tremor -- likely 2/2 to medication as it was present on admission with normal  glucose levels. (9/3) AM patient was hypoglycemic to 36, resolution of  hypoglycemia improved baseline tremor    --  Carbamazepine Level (9/6) and (9/8) - WNL    -- Monitor blood glucose, insulin changes as detailed below    -- continue outpatient psychiatric medications        #Constipation    Pt has hx of constipation and reports constipation on day of admission for the  past several days    - Lactulose    - Senna BID    - cont home miralax and colace        #DM    On Metformin outpatient. Episode of hypoglycemia to 36 (9/3) AM. D/Ced Lantus  and 4u Lispro    - A1c 7.5    - SSI TIDWM    - hold home metformin        Chronic:    - Schizoaffective disorder: Continue home Carbamazepine 400 mg qam and 600 mg  qhs, Mirtazapine 15 mg qhs, Zyprexa 10 mg qhs, Seroquel 300 mg qhs    - HLD: Continue home atorvastatin        #Social: Pt lives in a group home        Full code (confirmed)    HH Diet    SQ heparin    Daughter: Donnie Aho Keo: [phone].            **Electronically signed by Laurelyn Sickle, MD on 01/19/2020 13:45**        **Electronically signed by Laurelyn Sickle, MD on 01/19/2020 13:45**        **Electronically signed by Laurelyn Sickle, MD on 01/19/2020 13:45**

## 2020-01-10 NOTE — Progress Notes (Signed)
 **Subjective**    Subjective Overnight:    - CO2 43 in PM, given dose of Diamox        This AM:    - Continues to have tremors in lower extremities, but reports chronic    - Reports feels improved from breathing standpoint and LE edema with diuresis    - Denies chest pain, nausea, vomiting, diarrhea, and headache.    **Subjective**    Subjective Overnight:    - CO2 43 in PM, given dose of Diamox        This AM:    - Continues to have tremors in lower extremities, but reports chronic    - Reports feels improved from breathing standpoint and LE edema with diuresis    - Denies chest pain, nausea, vomiting, diarrhea, and headache.    **ROS**    Complete Review of Systems All other systems reviewed and negative except as  noted in the HPI.    **ROS**    Complete Review of Systems All other systems reviewed and negative except as  noted in the HPI.    **Exam**    Comment Gen: Well appearing in NAD, wearing nasal cannula comfortably    HEENT: MMM, PERRL, EOMI, unable to evaluate for JVD given body habitus    CV: RRR, no murmurs, rubs, gallops    Lung: overall diminished breath sounds, improved rales and bilateral crackles,  now only in bases    Abd: Soft, Nontender, no rebound, no guarding +Distended, +BS    Ext: WWP +Bilateral lower extremity edema to the shins Right > Left, decreased  from prior but still 2+    Neuro: no focal neurologic deficits; AOx3 + intermittent tremor of upper/lower  extremities +5/5 Extremity strengths.      Vital Signs    01/14/2020 03:27              -  How Obtained: Bed Scale          -  Weight: 78.4kg      01/14/2020 03:03              -  Temperature: 37 (35-37.8Cel)          -  Site: Oral          -  Heart Rate: 96H (60-90)          -  Site: Monitor          -  BP: 129/80 (90-140/60-90)          -  Site: Right Arm          -  Position: Lying          -  Method: Automated          -  Respirations: 18 (12-20)          -  O2 Saturation (%): 94          -  O2 Amount:  2.5 LPM          -  O2 Delivery Method: Nasal Cannula          -  MEWS Vital Sign Score: 1      01/13/2020 23:12              -  Morse Fall Risk Total: 60      CFS Vital Signs CS    01/14/2020 14:59              -  Shift Intake Total:          -  Shift Output Total:          -  Shift Balance: -        **Exam**    Comment Gen: Well appearing in NAD, wearing nasal cannula comfortably    HEENT: MMM, PERRL, EOMI, unable to evaluate for JVD given body habitus    CV: RRR, no murmurs, rubs, gallops    Lung: overall diminished breath sounds, improved rales and bilateral crackles,  now only in bases    Abd: Soft, Nontender, no rebound, no guarding +Distended, +BS    Ext: WWP +Bilateral lower extremity edema to the shins Right > Left, decreased  from prior but still 2+    Neuro: no focal neurologic deficits; AOx3 + intermittent tremor of upper/lower  extremities +5/5 Extremity strengths.      Vital Signs    01/14/2020 03:27              -  How Obtained: Bed Scale          -  Weight: 78.4kg      01/14/2020 03:03              -  Temperature: 37 (35-37.8Cel)          -  Site: Oral          -  Heart Rate: 96H (60-90)          -  Site: Monitor          -  BP: 129/80 (90-140/60-90)          -  Site: Right Arm          -  Position: Lying          -  Method: Automated          -  Respirations: 18 (12-20)          -  O2 Saturation (%): 94          -  O2 Amount: 2.5 LPM          -  O2 Delivery Method: Nasal Cannula          -  MEWS Vital Sign Score: 1      01/13/2020 23:12              -  Lattie Corns Fall Risk Total: 60      CFS Vital Signs CS    01/14/2020 14:59              -  Shift Intake Total:          -  Shift Output Total:          -  Shift Balance: -        **Labs**    All current labs have been reviewed by myself as of 01/14/2020 12:07.    **Labs**    All current labs have been reviewed by myself as of 01/14/2020 12:07.    **Radiology**    All current  radiographs have been reviewed by myself as of 01/14/2020 12:07.    **Radiology**    All current radiographs have been reviewed by myself as of 01/14/2020 12:07.    **Assessment and Plan**    Medication              -   **IRON SUCROSE COMPLEX (VENOFER)** 200 MG Intravenous QDAY for 2 Doses, Clinician Dir:ON DAYS 6 AND 7.   Pending Date: 01/16/2020          -   **  ACETAZOLAMIDE (DIAMOX)** 500 MG Oral ONE TIME for 1 Doses           -   **FUROSEMIDE (LASIX)** 80 MG Intravenous ONE TIME for 1 Doses           -   **HYDRALAZINE (APRESOLINE)** 50 MG Oral TID for 60 Days           -   **METOPROLOL (LOPRESSOR)** 12.5 MG Oral BID First Dose Now for 60 Days           -   **REMOVE PATCH** 1 PATCH Miscellaneous QDAY for 60 Days, Clinician Dir:OLD NICOTINE PATCH           -   **PATCH INTACT QSHIFT (PATCH INTACT INSPECTION)** 1 PATCH Topical CHECK for 60 Days, Clinician OZH:[id number] (NICOTINE) PATCH EVERY SHIFT           -   **NICOTINE TRANSDERMAL (HABITROL TRANSDERMAL)** 21 MG Topical QDAY First Dose Now for 60 Days           -   **ASPIRIN CHEWABLE** 81 MG Oral QDAY for 60 Days           -   **CARBAMAZEPINE (TEGRETOL)** 400 MG Oral QAM for 60 Days           -   ** ** ** **INSULIN LISPRO (HUMALOG) (HUMALOG)******** Sliding Scale Subcutaneous TIDWM for 60 Days      For Glucose 80 to 150 Give 0 Units    For Glucose 151 to 200 Give 2 Units    For Glucose 201 to 250 Give 3 Units    For Glucose 251 to 300 Give 4 Units    Notify Physician if: Call HO for glucose > 300          -   **IPRATROPIUM 0.02% INH (ATROVENT 0.02%)** 0.25 MG Inhalation Q6H for 60 Days, Clinician ONG:[id number] FOR TRELEGY           -   **OLANZAPINE (ZYPREXA)** 10 MG Oral QHS for 60 Days           -   **DOCUSATE SODIUM (COLACE)** 100 MG Oral BID for 60 Days           -   **CARBAMAZEPINE (TEGRETOL)** 600 MG Oral QHS for 60 Days           -   **SENNA (SENOKOT)** 8.6 MG Oral BID for 60 Days           -   **ATORVASTATIN (LIPITOR)** 20 MG  Oral QHS for 60 Days           -   **DORZOLAMIDE 2% OPHTH (TRUSOPT 2% OPHTH)** 1 DROP Both Eyes SOL BID for 60 Days           -   **MIRTAZAPINE (REMERON)** 15 MG Oral QHS for 60 Days           -   **QUETIAPINE FUMARATE (SEROQUEL)** 400 MG Oral QHS for 60 Days           -   **BRIMONIDINE 0.2% OPHTH (ALPHAGAN 0.2% OPHTH)** 1 DROP Both Eyes SOL BID for 60 Days, Clinician WNU:[id number] FOR 0.15% STRENGTH           -   **HYDROCORTISONE 1% CREAM** 1 APP Topical BID for 60 Days, Clinician Dir:SEE EXTENDED INSTRUCTIONS INTERCHANGE FOR 2.5%           -   **BUDESONIDE INHALATION (PULMICORT RESPULES)** 0.25 MG Inhalation BID for 60 Days, Clinician KVQ:[id number] FOR TRELEGY SEE ORDER #  31           -   **FORMOTEROL FUMARATE (PERFOROMIST)** 20 MCG Inhalation BID for 60 Days, Clinician ZOX:[id number] FOR TRELEGY SEE ORDER #31           -   **GLUCAGON** 1 MG Intramuscular PRN BG < 60 AND NO IV ACCESS for 60 Days, Clinician Dir:MAY BE GIVEN IM OR SUB-Q           -   **DEXTROSE 50% SYRINGE (DEXTROSE 50%)** 12.5 G Intravenous PRN BG < 60 AND CAN NOT TAKE PO for 60 Days           -   **BISACODYL (DULCOLAX)** 10 MG Rectal QDAYPRN CONSTIPATION for 60 Days           -   **HEPARIN** 5000 UNITS Subcutaneous Q12H for 60 Days           -   **IPRATROPIUM/ALBUTEROL SULFATE (DUONEB)** 3 ML Inhalation Q4HPRN SOB/WHEEZING for 60 Days           -   **BENZONATATE** 100 MG Oral TIDPRN COUGH for 60 Days           -   **POLYETHYLENE GLYCOL (MIRALAX)** 17 G Oral QDAYPRN CONSTIPATION for 60 Days           -   **ALBUTEROL INHALER (VENTOLIN HFA)** 2 PUFF Inhalation Q4HPRN SHORTNESS OF BREATH for 60 Days           -   **ACETAMINOPHEN (TYLENOL)** 650 MG Oral Q6HPRN PAIN for 60 Days           -   **AMMONIUM LACTATE 12% LOTION (LAC-HYDRIN 12%)** 1 APP Topical QDAYPRN RASH for 60 Days           Comment Arthur Young is a 54 yo male with history of HFrEF (EF 30%  05/2019), COPD not on home O2, HTN, DM, and,  Schizoaffective disorder  presenting from a group home for shortness of breath.        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 History of previous admissions for CHF exacerbation requiring IV  Diuresis. Previous dry weight of 75kg with most recent weight 81kg in the ED.  Etiology of exacerbation unclear, but probable medication non-adherence given  poot historian.    PRELOAD: Continued fluid overloaded on exam with lower extremity edema and CXR  notable for increased pulmonary vascular congestion with left pleural effusion    - Give lasix 80mg  IV in AM    - 500mg  PO diamox in AM given met alkalosis from contraction    --- Goal 1-1.5 L negative    - Strict I&O, Daily standing weights    PUMP    Echo 09/02 with normal sized LV with normal LV thickness and severely reduced  LV systolic function (EF 20-25%), Flattened septum consistent with RV  pressure/volume overload, Severe global hypokinesis of LV; Right Ventrible  moderate to severely dilated with severely reduced function, trace MR, severe  TR    - Metroprolol at low dose 12.5mg  bid    AFTERLOAD    - Hydralazine 50mg  TID    CORONARIES    - Continue home ASA and Atorvastatin 80mg     Neurohormonal: Previously discontinued Spironolactone for hyperkalemia in the  setting of K-sparing diuretics    -- Can consider Spironolactone for GDMT if  hyperkalemia stable        #Acute COPD Exacerbation - PFTS (07/21/19) FEV1 24% with FEV1/FVC 40% GOLD IV.    - Prednisone 40mg  qd 5 days (9/1-9/5)    ---- s/p Azithromycin 500mg  IV Q24H (9/2-3)    - SSA/SSB, RF per previous Pulm note Negative    -- Duonebs Q4HPRN        #AKI - Improving    Cr 1.85 on admission, baseline Cr possibly ~1.2-1.3. AKI probable cardiorenal  in the setting of CHF exacerbation, 1.67 (9/5), will decrease magnitude of  diuresis as patient is approaching a euvolemic state    -  Diuresis as above        #Anemia    #Polycythemia    # Iron Deficiency Anemia    Hgb 18.2 and Hct 59.9 possibly 2/2 COPD vs possible OSA. Can consider empiric  treatment of OSA with trial of CPAP if patient desaturates overnight. Iron  studies significant for Iron Deficiency anemia Iron 47, %Fe Saturation 12,  Ferritin 68, Transferrin 270. 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    - IV Venofer iso heart failure    - Consider outpatient workup (JAK2 testing and BM)    - Lower Extremity Ultrasound negative for DVT    - outpatient sleep study        #Cholestatic Liver Enzymes -- Elevated Alk Phos and GGT. Unknown if patient  has Hx of cirrhosis although with normal AST/ALT, less likely, but would  prefer inpatient imaging to assess    - RUQ Ultrasound        #Tremor -- likely 2/2 to medication as it was present on admission with normal  glucose levels. (9/3) AM patient was hypoglycemic to 36, resolution of  hypoglycemia improved baseline tremor.    -- Monitor blood glucose, insulin changes as detailed below    -- continue outpatient psychiatric medications        #Constipation    Pt has hx of constipation and reports constipation on day of admission for the  past several days    - Senna BID    - cont home miralax and colace        #DM    On Metformin outpatient. Episode of hypoglycemia to 36 (9/3) AM. D/Ced Lantus  and 4u Lispro    - A1c 7.5    - SSI TIDWM    - hold home metformin        Chronic:    - Schizoaffective disorder: Continue home Carbamazepine 400 mg qam and 600 mg  qhs, Mirtazapine 15 mg qhs, Zyprexa 10 mg qhs, Seroquel 300 mg qhs    - HLD: Continue home atorvastatin        #Social: Pt lives in a group home        Full code (confirmed)    HH Diet    SQ heparin.    **Assessment and Plan**    Medication              -   **IRON SUCROSE COMPLEX (VENOFER)** 200 MG Intravenous QDAY for 2 Doses, Clinician Dir:ON DAYS 6 AND 7.   Pending Date: 01/16/2020           -   **ACETAZOLAMIDE (DIAMOX)** 500 MG Oral ONE TIME for 1 Doses           -   **FUROSEMIDE (LASIX)** 80 MG Intravenous ONE TIME for 1 Doses           -   **  HYDRALAZINE (APRESOLINE)** 50 MG Oral TID for 60 Days           -   **METOPROLOL (LOPRESSOR)** 12.5 MG Oral BID First Dose Now for 60 Days           -   **REMOVE PATCH** 1 PATCH Miscellaneous QDAY for 60 Days, Clinician Dir:OLD NICOTINE PATCH           -   **PATCH INTACT QSHIFT (PATCH INTACT INSPECTION)** 1 PATCH Topical CHECK for 60 Days, Clinician ZOX:[id number] (NICOTINE) PATCH EVERY SHIFT           -   **NICOTINE TRANSDERMAL (HABITROL TRANSDERMAL)** 21 MG Topical QDAY First Dose Now for 60 Days           -   **ASPIRIN CHEWABLE** 81 MG Oral QDAY for 60 Days           -   **CARBAMAZEPINE (TEGRETOL)** 400 MG Oral QAM for 60 Days           -   ** ** ** **INSULIN LISPRO (HUMALOG) (HUMALOG)******** Sliding Scale Subcutaneous TIDWM for 60 Days      For Glucose 80 to 150 Give 0 Units    For Glucose 151 to 200 Give 2 Units    For Glucose 201 to 250 Give 3 Units    For Glucose 251 to 300 Give 4 Units    Notify Physician if: Call HO for glucose > 300          -   **IPRATROPIUM 0.02% INH (ATROVENT 0.02%)** 0.25 MG Inhalation Q6H for 60 Days, Clinician UJW:[id number] FOR TRELEGY           -   **OLANZAPINE (ZYPREXA)** 10 MG Oral QHS for 60 Days           -   **DOCUSATE SODIUM (COLACE)** 100 MG Oral BID for 60 Days           -   **CARBAMAZEPINE (TEGRETOL)** 600 MG Oral QHS for 60 Days           -   **SENNA (SENOKOT)** 8.6 MG Oral BID for 60 Days           -   **ATORVASTATIN (LIPITOR)** 20 MG Oral QHS for 60 Days           -   **DORZOLAMIDE 2% OPHTH (TRUSOPT 2% OPHTH)** 1 DROP Both Eyes SOL BID for 60 Days           -   **MIRTAZAPINE (REMERON)** 15 MG Oral QHS for 60 Days           -   **QUETIAPINE FUMARATE (SEROQUEL)** 400 MG Oral QHS for 60 Days           -   **BRIMONIDINE 0.2% OPHTH (ALPHAGAN 0.2% OPHTH)** 1 DROP Both Eyes SOL BID for  60 Days, Clinician OZH:[id number] FOR 0.15% STRENGTH           -   **HYDROCORTISONE 1% CREAM** 1 APP Topical BID for 60 Days, Clinician Dir:SEE EXTENDED INSTRUCTIONS INTERCHANGE FOR 2.5%           -   **BUDESONIDE INHALATION (PULMICORT RESPULES)** 0.25 MG Inhalation BID for 60 Days, Clinician XBM:[id number] FOR TRELEGY SEE ORDER #31           -   **FORMOTEROL FUMARATE (PERFOROMIST)** 20 MCG Inhalation BID for 60 Days, Clinician VOZ:[id number] FOR TRELEGY SEE ORDER #31           -   **GLUCAGON** 1  MG Intramuscular PRN BG < 60 AND NO IV ACCESS for 60 Days, Clinician Dir:MAY BE GIVEN IM OR SUB-Q           -   **DEXTROSE 50% SYRINGE (DEXTROSE 50%)** 12.5 G Intravenous PRN BG < 60 AND CAN NOT TAKE PO for 60 Days           -   **BISACODYL (DULCOLAX)** 10 MG Rectal QDAYPRN CONSTIPATION for 60 Days           -   **HEPARIN** 5000 UNITS Subcutaneous Q12H for 60 Days           -   **IPRATROPIUM/ALBUTEROL SULFATE (DUONEB)** 3 ML Inhalation Q4HPRN SOB/WHEEZING for 60 Days           -   **BENZONATATE** 100 MG Oral TIDPRN COUGH for 60 Days           -   **POLYETHYLENE GLYCOL (MIRALAX)** 17 G Oral QDAYPRN CONSTIPATION for 60 Days           -   **ALBUTEROL INHALER (VENTOLIN HFA)** 2 PUFF Inhalation Q4HPRN SHORTNESS OF BREATH for 60 Days           -   **ACETAMINOPHEN (TYLENOL)** 650 MG Oral Q6HPRN PAIN for 60 Days           -   **AMMONIUM LACTATE 12% LOTION (LAC-HYDRIN 12%)** 1 APP Topical QDAYPRN RASH for 60 Days           Comment Arthur Young is a 54 yo male with history of HFrEF (EF 30%  05/2019), COPD not on home O2, HTN, DM, and, Schizoaffective disorder  presenting from a group home for shortness of breath.        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 History of previous admissions for CHF exacerbation requiring IV  Diuresis. Previous dry weight of 75kg with most  recent weight 81kg in the ED.  Etiology of exacerbation unclear, but probable medication non-adherence given  poot historian.    PRELOAD: Continued fluid overloaded on exam with lower extremity edema and CXR  notable for increased pulmonary vascular congestion with left pleural effusion    - Give lasix 80mg  IV in AM    - 500mg  PO diamox in AM given met alkalosis from contraction    --- Goal 1-1.5 L negative    - Strict I&O, Daily standing weights    PUMP    Echo 09/02 with normal sized LV with normal LV thickness and severely reduced  LV systolic function (EF 20-25%), Flattened septum consistent with RV  pressure/volume overload, Severe global hypokinesis of LV; Right Ventrible  moderate to severely dilated with severely reduced function, trace MR, severe  TR    - Metroprolol at low dose 12.5mg  bid    AFTERLOAD    - Hydralazine 50mg  TID    CORONARIES    - Continue home ASA and Atorvastatin 80mg     Neurohormonal: Previously discontinued Spironolactone for hyperkalemia in the  setting of K-sparing diuretics    -- Can consider Spironolactone for GDMT if hyperkalemia stable        #Acute COPD Exacerbation - PFTS (07/21/19) FEV1 24% with FEV1/FVC 40% GOLD IV.    - Prednisone 40mg  qd 5 days (9/1-9/5)    ---- s/p Azithromycin 500mg  IV Q24H (9/2-3)    -  SSA/SSB, RF per previous Pulm note Negative    -- Duonebs Q4HPRN        #AKI - Improving    Cr 1.85 on admission, baseline Cr possibly ~1.2-1.3. AKI probable cardiorenal  in the setting of CHF exacerbation, 1.67 (9/5), will decrease magnitude of  diuresis as patient is approaching a euvolemic state    - Diuresis as above        #Anemia    #Polycythemia    # Iron Deficiency Anemia    Hgb 18.2 and Hct 59.9 possibly 2/2 COPD vs possible OSA. Can consider empiric  treatment of OSA with trial of CPAP if patient desaturates overnight. Iron  studies significant for Iron Deficiency anemia Iron 47, %Fe Saturation 12,  Ferritin 68, Transferrin 270. 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    - IV Venofer iso heart failure    - Consider outpatient workup (JAK2 testing and BM)    - Lower Extremity Ultrasound negative for DVT    - outpatient sleep study        #Cholestatic Liver Enzymes -- Elevated Alk Phos and GGT. Unknown if patient  has Hx of cirrhosis although with normal AST/ALT, less likely, but would  prefer inpatient imaging to assess    - RUQ Ultrasound        #Tremor -- likely 2/2 to medication as it was present on admission with normal  glucose levels. (9/3) AM patient was hypoglycemic to 36, resolution of  hypoglycemia improved baseline tremor.    -- Monitor blood glucose, insulin changes as detailed below    -- continue outpatient psychiatric medications        #Constipation    Pt has hx of constipation and reports constipation on day of admission for the  past several days    - Senna BID    - cont home miralax and colace        #DM    On Metformin outpatient. Episode of hypoglycemia to 36 (9/3) AM. D/Ced Lantus  and 4u Lispro    - A1c 7.5    - SSI TIDWM    - hold home metformin        Chronic:    - Schizoaffective disorder: Continue home Carbamazepine 400 mg qam and 600 mg  qhs, Mirtazapine 15 mg qhs, Zyprexa 10 mg qhs, Seroquel 300 mg qhs    - HLD: Continue home atorvastatin        #Social: Pt lives in a group home        Full code (confirmed)    HH Diet    SQ heparin.            **Electronically signed by Laurelyn Sickle, MD on 01/14/2020 12:13**        **Electronically signed by Laurelyn Sickle, MD on 01/14/2020 12:13**

## 2020-01-10 NOTE — Progress Notes (Signed)
 **Subjective**    Subjective Overnight:    - No acute events        This AM:    - Happy to see the doctors today and would like to get the foley out as it  bothers him, otherwise he states he's feeling "better" when compared to past  several days    - Denies chest pain, nausea, vomiting, diarrhea, and headache.    Tolerating Diet Yes.    **ROS**    Complete Review of Systems All other systems reviewed and negative except as  noted in the HPI.    **Exam**    Comment Weight: 74.8 (9/9) -> 75.16 -> 75.5 -> 75.6 kg (9/12) --> 75kg (9/14)    I/O: 1810/2800, N: -990        Gen: NAD, Sitting up in the chair, happy    HEENT: PERRL, JVD unable to be observed    CV: RRR, no murmurs, rubs, gallops    Lung: CTAB, normal WOB    Abd: Soft, Nontender, no rebound, no guarding    Ext: 1+ bilateral LE edema left > right    Neuro: no focal deficits on exam.      Vital Signs    01/23/2020 11:24              -  Morse Fall Risk Total: 75      01/23/2020 11:15              -  Temperature: 36.9 (35-37.8Cel)          -  Site: Oral          -  Heart Rate: 87 (60-90)          -  Site: Monitor          -  BP: 102/74 (90-140/60-90)          -  Site: Left Arm          -  Position: Sitting          -  Method: Automated          -  Respirations: 18 (12-20)          -  O2 Saturation (%): 95          -  O2 Delivery Method: Room Air          -  MEWS Vital Sign Score: 0      01/23/2020 09:35              -  Weight: 75kg      CFS Vital Signs CS    01/23/2020 14:59              -  Shift Intake Total:          -  Shift Output Total:          -  Shift Balance: -          I have reviewed and agree with vital signs as listed in the EMR Yes Time  01/23/2020 16:24.    **Labs**    All current labs have been reviewed by myself as of 01/23/2020 16:24.    **Radiology**    All current radiographs have been reviewed by myself as of 01/23/2020 16:25.    **Assessment and Plan**    Medication              -    **PREDNISONE (DELTASONE)** 30 MG Oral QDAY for 60 Days  Pending Date: 01/24/2020          -   **FUROSEMIDE (LASIX)** 80 MG Oral ONE TIME for 1 Doses           -   **FUROSEMIDE** 40 MG Intravenous ONE TIME for 1 Doses           -   **TAMSULOSIN (FLOMAX)** 0.4 MG Oral QHS for 60 Days           -   **CIPROFLOXACIN (CIPRO)** 750 MG Oral BID First Dose Now for 60 Days           -   **FUROSEMIDE (LASIX)** 40 MG Oral QDAY First Dose Now for 60 Days           -   **HYDRALAZINE (APRESOLINE)** 50 MG Oral TID for 60 Days, Clinician ZOX:WRUE FOR SYSTOLIC BP <90           -   **METOPROLOL XL (TOPROL XL)** 75 MG Oral QDAY for 60 Days           -   **LACTULOSE (CEPHULAC)** 20 G Oral BID for 60 Days           -   **IPRATROPIUM/ALBUTEROL SULFATE (DUONEB)** 3 ML Inhalation Q4H for 60 Days           -   **GUAIFENESIN SYRUP** 200 MG Oral Q4HPRN COUGH for 60 Days           -   **REMOVE PATCH** 1 PATCH Miscellaneous QDAY for 60 Days, Clinician Dir:OLD NICOTINE PATCH           -   **PATCH INTACT QSHIFT (PATCH INTACT INSPECTION)** 1 PATCH Topical CHECK for 60 Days, Clinician AVW:[id number] (NICOTINE) PATCH EVERY SHIFT           -   **NICOTINE TRANSDERMAL (HABITROL TRANSDERMAL)** 21 MG Topical QDAY First Dose Now for 60 Days           -   **ASPIRIN CHEWABLE** 81 MG Oral QDAY for 60 Days           -   **CARBAMAZEPINE (TEGRETOL)** 400 MG Oral QAM for 60 Days           -   ** ** ** **INSULIN LISPRO (HUMALOG) (HUMALOG)******** Sliding Scale Subcutaneous TIDWM for 60 Days      For Glucose 80 to 150 Give 0 Units    For Glucose 151 to 200 Give 2 Units    For Glucose 201 to 250 Give 3 Units    For Glucose 251 to 300 Give 4 Units    Notify Physician if: Call HO for glucose > 300          -   **MIRTAZAPINE (REMERON)** 15 MG Oral QHS for 60 Days           -   **OLANZAPINE (ZYPREXA)** 10 MG Oral QHS for 60 Days           -   **BUDESONIDE INHALATION (PULMICORT RESPULES)** 0.25 MG Inhalation BID for 60 Days, Clinician  YNW:[id number] FOR TRELEGY SEE ORDER #[id number]           -   **QUETIAPINE FUMARATE (SEROQUEL)** 400 MG Oral QHS for 60 Days           -   **DORZOLAMIDE 2% OPHTH (TRUSOPT 2% OPHTH)** 1 DROP Both Eyes SOL BID for 60 Days           -   **BRIMONIDINE 0.2% OPHTH (ALPHAGAN 0.2% OPHTH)** 1 DROP  Both Eyes SOL BID for 60 Days, Clinician JXB:[id number] FOR 0.15% STRENGTH           -   **CARBAMAZEPINE (TEGRETOL)** 600 MG Oral QHS for 60 Days           -   **ATORVASTATIN (LIPITOR)** 20 MG Oral QHS for 60 Days           -   **HYDROCORTISONE 1% CREAM** 1 APP Topical BID for 60 Days, Clinician Dir:SEE EXTENDED INSTRUCTIONS INTERCHANGE FOR 2.5%           -   **DOCUSATE SODIUM (COLACE)** 100 MG Oral BID for 60 Days           -   **FORMOTEROL FUMARATE (PERFOROMIST)** 20 MCG Inhalation BID for 60 Days, Clinician YQM:[id number] FOR TRELEGY SEE ORDER #31           -   **SENNA (SENOKOT)** 8.6 MG Oral BID for 60 Days           -   **GLUCAGON** 1 MG Intramuscular PRN BG < 60 AND NO IV ACCESS for 60 Days, Clinician Dir:MAY BE GIVEN IM OR SUB-Q           -   **DEXTROSE 50% SYRINGE (DEXTROSE 50%)** 12.5 G Intravenous PRN BG < 60 AND CAN NOT TAKE PO for 60 Days           -   **BISACODYL (DULCOLAX)** 10 MG Rectal QDAYPRN CONSTIPATION for 60 Days           -   **HEPARIN** 5000 UNITS Subcutaneous Q12H for 60 Days           -   **IPRATROPIUM/ALBUTEROL SULFATE (DUONEB)** 3 ML Inhalation Q4HPRN SOB/WHEEZING for 60 Days           -   **POLYETHYLENE GLYCOL (MIRALAX)** 17 G Oral QDAYPRN CONSTIPATION for 60 Days           -   **AMMONIUM LACTATE 12% LOTION (LAC-HYDRIN 12%)** 1 APP Topical QDAYPRN RASH for 60 Days           -   **ALBUTEROL INHALER (VENTOLIN HFA)** 2 PUFF Inhalation Q4HPRN SHORTNESS OF BREATH for 60 Days           -   **ACETAMINOPHEN (TYLENOL)** 650 MG Oral Q6HPRN PAIN for 60 Days           -   **BENZONATATE** 100 MG Oral TIDPRN COUGH for 60 Days           Comment Arthur Young is a 54 yo male with  history of HFrEF (EF 30%  05/2019), COPD not on home O2, HTN, DM, and, Schizoaffective disorder  presenting from a group home for shortness of breath.        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 History of previous admissions for CHF exacerbation requiring IV  Diuresis. Previous dry weight of 75kg with most recent weight 81kg in the ED.  Etiology of exacerbation unclear, but probable medication non-adherence given  poot historian. Spoke to patient with his daughter Donnie Aho) present on (9/9)  regarding CHF/COPD exacerbation and possible malignancy with subsequent steps  -- voiced understanding and that she would be the most appropriate HCP.    PRELOAD: Continued fluid overloaded on exam with lower extremity edema and CXR  notable for increased pulmonary  vascular congestion with left pleural effusion    - 40mg  AM PO Lasix and 80mg  Lasix PO PM -- Lost IV access and patient does  not want another IV placed -- will continue with PO diuresis    --- Goal Negative: 0.5 - 1.0L (9/14)    - Strict I&O, Daily standing weights    PUMP    Echo 09/02 with normal sized LV with normal LV thickness and severely reduced  LV systolic function (EF 20-25%), Flattened septum consistent with RV  pressure/volume overload, Severe global hypokinesis of LV; Right Ventricle  moderate to severely dilated with severely reduced function, trace MR, severe  TR    - Metroprolol 75mg  XL qd    - Repeat inpatient TTE as outpatient to re-evaluate right sided dysfunction    AFTERLOAD    - Hydralazine 50mg  TID    CORONARIES    - Continue home ASA and Atorvastatin 80mg     Neurohormonal: Previously discontinued Spironolactone for hyperkalemia in the  setting of K-sparing diuretics    - Can consider Spironolactone for GDMT if hyperkalemia stable    - Can consider SGLT2 inhibior as outpatient given hx DM        # Acute COPD  Exacerbation    # Pseudomonas Pneumonia    PFTS (07/21/19) FEV1 24% with FEV1/FVC 40% GOLD IV. Pulm previously wanted  outpatient CT Chest, as patient is inpatient and being actively diuresed with  changes in mental status, will go ahead and pursue inpatient CT Chest.  SSA/SSB, RF per previous Pulm note Negative. Tmax 38.0, BCx drawn (NGTD), and  SBPs in 90s. Afebrile as of (9/9) without leukocytosis.    - Sputum Cx: Pansensitive Pseudomonas    - Cont Ciprofloxacin (9/11-) given sensitive on culture    --- End date 9/14    --- EKG (9/12) QTc 473    --- S/p Cefepime 1g Q24H (9/8 - 9/11), Vancomycin (9/7-9/9), Ceftriaxone 1g  Q24H (9/7-9/8), Azithromycin 500mg  IV Q24H (9/2-3)    - Pulmonary c/s, recs appreciated    --- Plan for prolonged prednisone taper    --- do not anticipate RHC for this patient given poor functional status and  poor candidacy for PHTN therapies    - Prednisone 40mg  qd 5 days (9/7-)    --- S/p Prednisone 40mg  (s/p 9/1-9/5)    --- Plan to decrease by 10 mg every 7 days until off    -- Duonebs Q4H    - oxygen goal > 88%        #Pulmonay Nodules    CT Chest Impression (9/7) noted new innumerable bilateral pulmonary nodules  are concerning for metastasis. CT of the abdomen and pelvis with IV contrast  is recommended for further evaluation with a small focal lucency in the  manubrium of the sternum could represent a lytic metastasis. Given possible  metastatis, will look for primary cancer site    - PSA WNL    - Definitive biopsy would likely be a VATS wedge -- no primary tumor on CT  A/P    - F/u outpatient clinic        #AKI    #Urinary Retension    Cr 1.85 on admission, baseline Cr possibly ~1.2-1.3. AKI probable cardiorenal  in the setting of CHF exacerbation, 1.67 (9/5), will decrease magnitude of  diuresis as patient is approaching a euvolemic state. Creatinine (9/5) 1.83 ->  2.02 -> 1.99 (9/6) -> 2.55 (9/7) -> 1.92 -> 1.34 (9/14). Etiology of AKI most  likely 2/2 new urinary retention as found  on RUQ Ultrasound (9/7) and  improvement in kidney function with placement of foley catheter (9/8).    - Failed trial of void (9/10) and (9/14) with PVR of ~900cc    -- Foley was irritating patient's urethra and noted he needed to increase  intra-abdominal pressure for it to come out (bending over, pushing on his  suprapubic region) so it was removed (9/14) to attempt a voiding trial and he  subsequently failed    - Cont. Flomax    - Plan to f/u urology as outpatient for next trial of void    - Monitor SCr        #Anemia    #Polycythemia    # Iron Deficiency Anemia    Hgb 18.2 and Hct 59.9 possibly 2/2 COPD vs possible OSA. Can consider empiric  treatment of OSA with trial of CPAP if patient desaturates overnight. Iron  studies significant for Iron Deficiency anemia Iron 47, %Fe Saturation 12,  Ferritin 68, Transferrin 270. 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    - IV Venofer iso heart failure    - Consider outpatient workup (JAK2 testing and BM)    - Lower Extremity Ultrasound negative for DVT    - Outpatient sleep study        #Cholestatic Liver Enzymes -- Elevated Alk Phos and GGT. Unknown if patient  has Hx of cirrhosis although with normal AST/ALT, less likely, but would  prefer inpatient imaging to assess. Eyes appear slightly icteric (9/6) PM  without significant elevation of Bilirubin.    - RUQ Ultrasound (9/6) Impression noted Massive distention of the urinary  bladder with estimated volume of 1.5 L and Unremarkable appearance of the  liver        #Constipation    Pt has hx of constipation and reports constipation on day of admission for the  past several days    - Lactulose    - Senna BID    - cont home miralax and colace        #DM    On Metformin outpatient. Episode of hypoglycemia to 36 (9/3) AM. D/Ced Lantus  and 4u Lispro    - A1c 7.5    - SSI TIDWM    - hold home metformin        Chronic:    - Tremor: Patient has baseline upper extremity tremors  with home psychiatric  medications    - Schizoaffective disorder: Continue home Carbamazepine 400 mg qam and 600 mg  qhs, Mirtazapine 15 mg qhs, Zyprexa 10 mg qhs, Seroquel 300 mg qhs    - HLD: Continue home atorvastatin        Full code (confirmed)    HH Diet    SQ heparin    Daughter: Hulda Humphrey: [phone]    Social: Pt lives in a group home.            **Electronically signed by Laurelyn Sickle, MD on 01/23/2020 16:31**

## 2020-01-10 NOTE — Progress Notes (Signed)
 **Subjective**    Subjective Overnight:    - Admitted        This AM:    -- Endorsing shakiness of upper and lower extremities that he states is not  new and started when he began one of his "psychiatrist drugs"    -- Denies chest pain, nausea, vomiting, diarrhea, and headache.    **Subjective**    Subjective Overnight:    - Admitted        This AM:    -- Endorsing shakiness of upper and lower extremities that he states is not  new and started when he began one of his "psychiatrist drugs"    -- Denies chest pain, nausea, vomiting, diarrhea, and headache.    **ROS**    Complete Review of Systems All other systems reviewed and negative except as  noted in the HPI.    **ROS**    Complete Review of Systems All other systems reviewed and negative except as  noted in the HPI.    **Exam**    Comment Gen: NAD    HEENT: MMM, PERRL, EOMI    CV: RRR, no murmurs, rubs, gallops    Lung: overall diminished; +Bilateral Diffuse Inspiratory and Expiratory  wheezing    Abd: Soft, Nontender, no rebound, no guarding +Distended, +BS    Ext: WWP +Bilateral lower extremity edema to the shins Right > Left    Neuro: no focal neurologic deficits; AOx3 +Resting tremor of upper extremities  +5/5 Extremity strengths.      Vital Signs    01/12/2020 11:12              -  O2 Amount: 2.0 LPM          -  Morse Fall Risk Total: 35      01/12/2020 11:04              -  Temperature: 37 (35-37.8Cel)          -  Site: Oral          -  Heart Rate: 101H (60-90)          -  Site: Monitor          -  BP: 114/77 (90-140/60-90)          -  Site: Right Arm          -  Position: Sitting          -  Method: Automated          -  Respirations: 18 (12-20)          -  O2 Saturation (%): 94          -  O2 Delivery Method: Nasal Cannula          -  MEWS Vital Sign Score: 1      01/11/2020 06:34              -  How Obtained: Stand. Scale          -  Weight: 85.5kg      CFS Vital Signs CS    01/12/2020 14:59              -  Shift  Intake Total:          -  Shift Output Total:          -  Shift Balance: -          I have reviewed and agree with  vital signs as listed in the EMR Yes Time  01/12/2020 14:47.    **Exam**    Comment Gen: NAD    HEENT: MMM, PERRL, EOMI    CV: RRR, no murmurs, rubs, gallops    Lung: overall diminished; +Bilateral Diffuse Inspiratory and Expiratory  wheezing    Abd: Soft, Nontender, no rebound, no guarding +Distended, +BS    Ext: WWP +Bilateral lower extremity edema to the shins Right > Left    Neuro: no focal neurologic deficits; AOx3 +Resting tremor of upper extremities  +5/5 Extremity strengths.      Vital Signs    01/12/2020 11:12              -  O2 Amount: 2.0 LPM          -  Morse Fall Risk Total: 35      01/12/2020 11:04              -  Temperature: 37 (35-37.8Cel)          -  Site: Oral          -  Heart Rate: 101H (60-90)          -  Site: Monitor          -  BP: 114/77 (90-140/60-90)          -  Site: Right Arm          -  Position: Sitting          -  Method: Automated          -  Respirations: 18 (12-20)          -  O2 Saturation (%): 94          -  O2 Delivery Method: Nasal Cannula          -  MEWS Vital Sign Score: 1      01/11/2020 06:34              -  How Obtained: Stand. Scale          -  Weight: 85.5kg      CFS Vital Signs CS    01/12/2020 14:59              -  Shift Intake Total:          -  Shift Output Total:          -  Shift Balance: -          I have reviewed and agree with vital signs as listed in the EMR Yes Time  01/12/2020 14:47.    **[employer] Discussed with Patient and/or Family Yes.    All current labs have been reviewed by myself as of 01/12/2020 14:47.    **[facility] Discussed with Patient and/or Family Yes.    All current labs have been reviewed by myself as of 01/12/2020 14:47.    **Radiology**    All current radiographs have been reviewed by myself as of 01/12/2020 14:47.    **Radiology**    All current  radiographs have been reviewed by myself as of 01/12/2020 14:47.    **Assessment and Plan**    Medication              -   **IRON SUCROSE COMPLEX (VENOFER)** 200 MG Intravenous QDAY for 2 Doses, Clinician Dir:ON DAYS 6 AND 7.   Pending Date: 01/16/2020          -   **  HYDRALAZINE (APRESOLINE)** 25 MG Oral TID for 60 Days           -   **FUROSEMIDE (LASIX)** 120 MG Intravenous ONE TIME for 1 Doses           -   **REMOVE PATCH** 1 PATCH Miscellaneous QDAY for 60 Days, Clinician Dir:OLD NICOTINE PATCH           -   **IRON SUCROSE COMPLEX (VENOFER)** 200 MG Intravenous QDAY First Dose Now for 3 Doses, Clinician Dir:ON DAYS 1, 2 AND 3.           -   **NICOTINE TRANSDERMAL (HABITROL TRANSDERMAL)** 21 MG Topical QDAY First Dose Now for 60 Days           -   **PATCH INTACT QSHIFT (PATCH INTACT INSPECTION)** 1 PATCH Topical CHECK for 60 Days, Clinician ZOX:WRUEAVW (NICOTINE) PATCH EVERY SHIFT           -   **ASPIRIN CHEWABLE** 81 MG Oral QDAY for 60 Days           -   **CARBAMAZEPINE (TEGRETOL)** 400 MG Oral QAM for 60 Days           -   ** ** ** **INSULIN LISPRO (HUMALOG) (HUMALOG)******** Sliding Scale Subcutaneous TIDWM for 60 Days      For Glucose 80 to 150 Give 0 Units    For Glucose 151 to 200 Give 2 Units    For Glucose 201 to 250 Give 3 Units    For Glucose 251 to 300 Give 4 Units    Notify Physician if: Call HO for glucose > 300          -   **IPRATROPIUM 0.02% INH (ATROVENT 0.02%)** 0.25 MG Inhalation Q6H for 60 Days, Clinician UJW:[id number] FOR TRELEGY           -   **CARBAMAZEPINE (TEGRETOL)** 600 MG Oral QHS for 60 Days           -   **DOCUSATE SODIUM (COLACE)** 100 MG Oral BID for 60 Days           -   **ATORVASTATIN (LIPITOR)** 20 MG Oral QHS for 60 Days           -   **QUETIAPINE FUMARATE (SEROQUEL)** 400 MG Oral QHS for 60 Days           -   **DORZOLAMIDE 2% OPHTH (TRUSOPT 2% OPHTH)** 1 DROP Both Eyes SOL BID for 60 Days           -   **OLANZAPINE (ZYPREXA)** 10 MG Oral QHS  for 60 Days           -   **MIRTAZAPINE (REMERON)** 15 MG Oral QHS for 60 Days           -   **HYDROCORTISONE 1% CREAM** 1 APP Topical BID for 60 Days, Clinician Dir:SEE EXTENDED INSTRUCTIONS INTERCHANGE FOR 2.5%           -   **SENNA (SENOKOT)** 8.6 MG Oral BID for 60 Days           -   **FORMOTEROL FUMARATE (PERFOROMIST)** 20 MCG Inhalation BID for 60 Days, Clinician OZH:[id number] FOR TRELEGY SEE ORDER #31           -   **BUDESONIDE INHALATION (PULMICORT RESPULES)** 0.25 MG Inhalation BID for 60 Days, Clinician XBM:[id number] FOR TRELEGY SEE ORDER #31           -   **BRIMONIDINE 0.2% OPHTH (  ALPHAGAN 0.2% OPHTH)** 1 DROP Both Eyes SOL BID for 60 Days, Clinician ZOX:[id number] FOR 0.15% STRENGTH           -   **GLUCAGON** 1 MG Intramuscular PRN BG < 60 AND NO IV ACCESS for 60 Days, Clinician Dir:MAY BE GIVEN IM OR SUB-Q           -   **DEXTROSE 50% SYRINGE (DEXTROSE 50%)** 12.5 G Intravenous PRN BG < 60 AND CAN NOT TAKE PO for 60 Days           -   **BISACODYL (DULCOLAX)** 10 MG Rectal QDAYPRN CONSTIPATION for 60 Days           -   **HEPARIN** 5000 UNITS Subcutaneous Q12H for 60 Days           -   **IPRATROPIUM/ALBUTEROL SULFATE (DUONEB)** 3 ML Inhalation Q4HPRN SOB/WHEEZING for 60 Days           -   **BENZONATATE** 100 MG Oral TIDPRN COUGH for 60 Days           -   **AMMONIUM LACTATE 12% LOTION (LAC-HYDRIN 12%)** 1 APP Topical QDAYPRN RASH for 60 Days           -   **ALBUTEROL INHALER (VENTOLIN HFA)** 2 PUFF Inhalation Q4HPRN SHORTNESS OF BREATH for 60 Days           -   **ACETAMINOPHEN (TYLENOL)** 650 MG Oral Q6HPRN PAIN for 60 Days           -   **POLYETHYLENE GLYCOL (MIRALAX)** 17 G Oral QDAYPRN CONSTIPATION for 60 Days           Comment Patient is a 54 y/o male with history of HFrEF (EF 30% 05/2019), COPD  not on home O2, HTN, DM, and, Schizoaffective disorder presenting from a group  home for shortness of breath.        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 History of previous admissions for CHF  exacerbation requiring IV Diuresis. Previous dry weight of 75kg with most  recent weight 81kg in the ED. Etiology of exacerbation unclear. Worsening  heart failure 2/2 viral infection vs. recent MI vs. medication non-compliance.  Lives in a group home, will reach out to determine patient adherence to  medication regimen.    --- PRELOAD: Fluid overloaded on exam with lower extremity edema and CXR  notable for increased pulmonary vascular congestion with left pleural effusion    -- 120mg  IV Lasix this AM/PM    ---> N: -3100cc (9/3) PM    -- strict I&O    -- daily standing weights        --PUMP-- HFrEF (LVEF 30%) Ejection Fraction = 20-25%. Flattened septum is  consistent with RV pressure/volume overload. There is severe global  hypokinesis of the left    ventricle    -- Holding home Metoprolol        --AFTERLOAD--    -- 25mg  Hydralazine TID        --- CORONARIES--    -- ASA and Atorvastatin 80mg         --- Neurohormonal --- Previously discontinued Spironolactone for hyperkalemia  in the setting of K-sparing diuretics    -- Can consider Spironolactone for GDMT        #Acute COPD Exacerbation - PFTS (07/21/19) FEV1 24% with  FEV1/FVC 40% GOLD IV.    -- Prednisone 40mg  qd 5 days    -- s/p Azithromycin 500mg  IV Q24H (9/2)    -- SSA/SSB, RF, Anti-CCP serologies per previous Pulm note    -- Duonebs Q4HPRN        #AKI    Cr 1.85 on admission, baseline Cr possibly ~1.2-1.3, but unknown if new Cr  baseline is 1.85. AKI possibly due to cardiorenal in the setting of CHF  exacerbation vs. new baseline. Will trend and follow-up diuretics response.    - Diuresis as above        #Anemia    #Polycythemia    Hgb 18.2 and Hct 59.9 possibly 2/2 COPD vs possible OSA. Can consider empiric  treatment of OSA with trial of CPAP if patient desaturates overnight. Iron  studies significant  for Iron Deficiency anemia Iron 47, %Fe Saturation 12,  Ferritin 68, Transferrin 270.    - IV Venofer iso heart failure    - Consider outpatient workup (JAK2 testing and BM)    - EPO level    - Lower Extremity Ultrasound -- Right > Left lower extremity edema iso  polycythemia    - outpatient sleep study        #Tremor -- likely 2/2 to medication as it was present on admission with normal  glucose levels. (9/3) AM patient was hypoglycemic to 36, resolution of  hypoglycemia improved baseline tremor.    -- Monitor blood glucose, insulin changes as detailed below    -- continue outpatient psychiatric medications        #Constipation    Pt has hx of constipation and reports constipation on day of admission for the  past several days    - Senna BID    - cont home miralax and colace        #DM    On Metformin outpatient. Episode of hypoglycemia to 36 (9/3) AM. D/Ced Lantus  and 4u Lispro    - A1c 7.5    - SSI TIDWM    - hold home metformin        #Schizoaffective disorder    - Carbamazepine 400 mg qam and 600 mg qhs    - Mirtazapine 15 mg qhs    - Zyprexa 10 mg qhs    - Seroquel 300 mg qhs        #HTN    #HLD: Cont Home Atorvastatin        #Social: Pt lives in a group home        Full code (confirmed)    HH Diet    SQ heparin.    **Assessment and Plan**    Medication              -   **IRON SUCROSE COMPLEX (VENOFER)** 200 MG Intravenous QDAY for 2 Doses, Clinician Dir:ON DAYS 6 AND 7.   Pending Date: 01/16/2020          -   **HYDRALAZINE (APRESOLINE)** 25 MG Oral TID for 60 Days           -   **FUROSEMIDE (LASIX)** 120 MG Intravenous ONE TIME for 1 Doses           -   **REMOVE PATCH** 1 PATCH Miscellaneous QDAY for 60 Days, Clinician Dir:OLD NICOTINE PATCH           -   **IRON SUCROSE COMPLEX (VENOFER)** 200 MG Intravenous QDAY First Dose Now for 3 Doses, Clinician Dir:ON DAYS 1, 2 AND  3.           -   **NICOTINE TRANSDERMAL (HABITROL TRANSDERMAL)** 21 MG Topical QDAY First Dose Now for 60 Days           -    **PATCH INTACT QSHIFT (PATCH INTACT INSPECTION)** 1 PATCH Topical CHECK for 60 Days, Clinician ZOX:WRUEAVW (NICOTINE) PATCH EVERY SHIFT           -   **ASPIRIN CHEWABLE** 81 MG Oral QDAY for 60 Days           -   **CARBAMAZEPINE (TEGRETOL)** 400 MG Oral QAM for 60 Days           -   ** ** ** **INSULIN LISPRO (HUMALOG) (HUMALOG)******** Sliding Scale Subcutaneous TIDWM for 60 Days      For Glucose 80 to 150 Give 0 Units    For Glucose 151 to 200 Give 2 Units    For Glucose 201 to 250 Give 3 Units    For Glucose 251 to 300 Give 4 Units    Notify Physician if: Call HO for glucose > 300          -   **IPRATROPIUM 0.02% INH (ATROVENT 0.02%)** 0.25 MG Inhalation Q6H for 60 Days, Clinician UJW:[id number] FOR TRELEGY           -   **CARBAMAZEPINE (TEGRETOL)** 600 MG Oral QHS for 60 Days           -   **DOCUSATE SODIUM (COLACE)** 100 MG Oral BID for 60 Days           -   **ATORVASTATIN (LIPITOR)** 20 MG Oral QHS for 60 Days           -   **QUETIAPINE FUMARATE (SEROQUEL)** 400 MG Oral QHS for 60 Days           -   **DORZOLAMIDE 2% OPHTH (TRUSOPT 2% OPHTH)** 1 DROP Both Eyes SOL BID for 60 Days           -   **OLANZAPINE (ZYPREXA)** 10 MG Oral QHS for 60 Days           -   **MIRTAZAPINE (REMERON)** 15 MG Oral QHS for 60 Days           -   **HYDROCORTISONE 1% CREAM** 1 APP Topical BID for 60 Days, Clinician Dir:SEE EXTENDED INSTRUCTIONS INTERCHANGE FOR 2.5%           -   **SENNA (SENOKOT)** 8.6 MG Oral BID for 60 Days           -   **FORMOTEROL FUMARATE (PERFOROMIST)** 20 MCG Inhalation BID for 60 Days, Clinician OZH:[id number] FOR TRELEGY SEE ORDER #31           -   **BUDESONIDE INHALATION (PULMICORT RESPULES)** 0.25 MG Inhalation BID for 60 Days, Clinician XBM:[id number] FOR TRELEGY SEE ORDER #31           -   **BRIMONIDINE 0.2% OPHTH (ALPHAGAN 0.2% OPHTH)** 1 DROP Both Eyes SOL BID for 60 Days, Clinician VOZ:[id number] FOR 0.15% STRENGTH           -   **GLUCAGON** 1 MG Intramuscular PRN  BG < 60 AND NO IV ACCESS for 60 Days, Clinician Dir:MAY BE GIVEN IM OR SUB-Q           -   **DEXTROSE 50% SYRINGE (DEXTROSE 50%)** 12.5 G Intravenous PRN BG < 60 AND CAN NOT TAKE PO for 60 Days           -   **  BISACODYL (DULCOLAX)** 10 MG Rectal QDAYPRN CONSTIPATION for 60 Days           -   **HEPARIN** 5000 UNITS Subcutaneous Q12H for 60 Days           -   **IPRATROPIUM/ALBUTEROL SULFATE (DUONEB)** 3 ML Inhalation Q4HPRN SOB/WHEEZING for 60 Days           -   **BENZONATATE** 100 MG Oral TIDPRN COUGH for 60 Days           -   **AMMONIUM LACTATE 12% LOTION (LAC-HYDRIN 12%)** 1 APP Topical QDAYPRN RASH for 60 Days           -   **ALBUTEROL INHALER (VENTOLIN HFA)** 2 PUFF Inhalation Q4HPRN SHORTNESS OF BREATH for 60 Days           -   **ACETAMINOPHEN (TYLENOL)** 650 MG Oral Q6HPRN PAIN for 60 Days           -   **POLYETHYLENE GLYCOL (MIRALAX)** 17 G Oral QDAYPRN CONSTIPATION for 60 Days           Comment Patient is a 54 y/o male with history of HFrEF (EF 30% 05/2019), COPD  not on home O2, HTN, DM, and, Schizoaffective disorder presenting from a group  home for shortness of breath.        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 History of previous admissions for CHF  exacerbation requiring IV Diuresis. Previous dry weight of 75kg with most  recent weight 81kg in the ED. Etiology of exacerbation unclear. Worsening  heart failure 2/2 viral infection vs. recent MI vs. medication non-compliance.  Lives in a group home, will reach out to determine patient adherence to  medication regimen.    --- PRELOAD: Fluid overloaded on exam with lower extremity edema and CXR  notable for increased pulmonary vascular congestion with left pleural effusion    -- 120mg  IV Lasix this AM/PM    ---> N: -3100cc (9/3) PM    -- strict I&O    -- daily standing weights        --PUMP-- HFrEF (LVEF 30%)  Ejection Fraction = 20-25%. Flattened septum is  consistent with RV pressure/volume overload. There is severe global  hypokinesis of the left    ventricle    -- Holding home Metoprolol        --AFTERLOAD--    -- 25mg  Hydralazine TID        --- CORONARIES--    -- ASA and Atorvastatin 80mg         --- Neurohormonal --- Previously discontinued Spironolactone for hyperkalemia  in the setting of K-sparing diuretics    -- Can consider Spironolactone for GDMT        #Acute COPD Exacerbation - PFTS (07/21/19) FEV1 24% with FEV1/FVC 40% GOLD IV.    -- Prednisone 40mg  qd 5 days    -- s/p Azithromycin 500mg  IV Q24H (9/2)    -- SSA/SSB, RF, Anti-CCP serologies per previous Pulm note    -- Duonebs Q4HPRN        #AKI    Cr 1.85 on admission, baseline Cr possibly ~1.2-1.3, but unknown if new Cr  baseline is 1.85. AKI possibly due to cardiorenal in the setting of CHF  exacerbation vs. new baseline. Will trend and follow-up diuretics response.    - Diuresis as above        #  Anemia    #Polycythemia    Hgb 18.2 and Hct 59.9 possibly 2/2 COPD vs possible OSA. Can consider empiric  treatment of OSA with trial of CPAP if patient desaturates overnight. Iron  studies significant for Iron Deficiency anemia Iron 47, %Fe Saturation 12,  Ferritin 68, Transferrin 270.    - IV Venofer iso heart failure    - Consider outpatient workup (JAK2 testing and BM)    - EPO level    - Lower Extremity Ultrasound -- Right > Left lower extremity edema iso  polycythemia    - outpatient sleep study        #Tremor -- likely 2/2 to medication as it was present on admission with normal  glucose levels. (9/3) AM patient was hypoglycemic to 36, resolution of  hypoglycemia improved baseline tremor.    -- Monitor blood glucose, insulin changes as detailed below    -- continue outpatient psychiatric medications        #Constipation    Pt has hx of constipation and reports constipation on day of admission for the  past several days    - Senna BID    - cont home miralax  and colace        #DM    On Metformin outpatient. Episode of hypoglycemia to 36 (9/3) AM. D/Ced Lantus  and 4u Lispro    - A1c 7.5    - SSI TIDWM    - hold home metformin        #Schizoaffective disorder    - Carbamazepine 400 mg qam and 600 mg qhs    - Mirtazapine 15 mg qhs    - Zyprexa 10 mg qhs    - Seroquel 300 mg qhs        #HTN    #HLD: Cont Home Atorvastatin        #Social: Pt lives in a group home        Full code (confirmed)    HH Diet    SQ heparin.            **Electronically signed by Laurelyn Sickle, MD on 01/12/2020 15:39**        **Electronically signed by Laurelyn Sickle, MD on 01/12/2020 15:39**

## 2020-01-10 NOTE — Progress Notes (Signed)
 **Subjective**    Subjective No acute events overnight. This AM, the patient is without  complaints. Reports continued improvement in respiratory status. No CP.  Improved cough. Foley placed for retention. .    **Subjective**    Subjective No acute events overnight. This AM, the patient is without  complaints. Reports continued improvement in respiratory status. No CP.  Improved cough. Foley placed for retention. .    **Exam**    Comment Temp 38, HR 90s, BP 90s-130s/70s-80s, 2L NC with intermittent BiPAP    Weight: 79.6 (9/6) -> 76.4 (9/7)        Gen: NAD, Lying in bed    HEENT: PERRL, unable to evaluate for JVD given body habitus +Mild Scleral  Icterus    CV: RRR, no murmurs, rubs, gallops    Lung: +Diffuse Bilateral Crackles with worsening wheezing    Abd: Soft, Nontender, no rebound, no guarding +Distended, +BS    Ext: WWP +Bilateral lower extremity edema to the shins Right > Left, decreased  from prior but trace edema present    Neuro: +intermittent tremor of upper/lower extremities +5/5 Extremity  strengths.      Vital Signs    01/17/2020 17:07              -  Temperature: 37.1 (35-37.8Cel)          -  Site: Oral          -  Heart Rate: 94H (60-90)          -  Site: Monitor          -  BP: 119/83 (90-140/60-90)          -  Site: Left Arm          -  Position: Sitting          -  Method: Automated          -  Respirations: 18 (12-20)          -  O2 Saturation (%): 95          -  O2 Amount: 2.5 LPM          -  O2 Delivery Method: Nasal Cannula          -  MEWS Vital Sign Score: 1      01/17/2020 12:48              -  Morse Fall Risk Total: 100      01/17/2020 11:30              -  BP #2: 91/73 (90-140/60-90)          -  Method: Automated      01/16/2020 09:09              -  How Obtained: Bed Scale          -  Weight: 76.4kg      CFS Vital Signs CS    01/17/2020 22:59              -  Shift Intake Total:          -  Shift Output Total: 0ml          -  Shift Balance:         **Exam**    Comment Temp 38, HR 90s, BP 90s-130s/70s-80s, 2L NC with intermittent BiPAP    Weight: 79.6 (9/6) -> 76.4 (9/7)  Gen: NAD, Lying in bed    HEENT: PERRL, unable to evaluate for JVD given body habitus +Mild Scleral  Icterus    CV: RRR, no murmurs, rubs, gallops    Lung: +Diffuse Bilateral Crackles with worsening wheezing    Abd: Soft, Nontender, no rebound, no guarding +Distended, +BS    Ext: WWP +Bilateral lower extremity edema to the shins Right > Left, decreased  from prior but trace edema present    Neuro: +intermittent tremor of upper/lower extremities +5/5 Extremity  strengths.      Vital Signs    01/17/2020 17:07              -  Temperature: 37.1 (35-37.8Cel)          -  Site: Oral          -  Heart Rate: 94H (60-90)          -  Site: Monitor          -  BP: 119/83 (90-140/60-90)          -  Site: Left Arm          -  Position: Sitting          -  Method: Automated          -  Respirations: 18 (12-20)          -  O2 Saturation (%): 95          -  O2 Amount: 2.5 LPM          -  O2 Delivery Method: Nasal Cannula          -  MEWS Vital Sign Score: 1      01/17/2020 12:48              -  Morse Fall Risk Total: 100      01/17/2020 11:30              -  BP #2: 91/73 (90-140/60-90)          -  Method: Automated      01/16/2020 09:09              -  How Obtained: Bed Scale          -  Weight: 76.4kg      CFS Vital Signs CS    01/17/2020 22:59              -  Shift Intake Total:          -  Shift Output Total: 0ml          -  Shift Balance:        **Assessment and Plan**    Medication              -   **HYDRALAZINE (APRESOLINE)** 10 MG Oral TID for 57 Days, Clinician MVH:QION SBP <90S. SEE ORDER #55.   Pending Date: 01/17/2020          -   **METOPROLOL XL (TOPROL XL)** 12.5 MG Oral BID for 60 Days, Clinician GEX:BMWU SBPS < 90   Pending Date: 01/17/2020          -   **CEFEPIME (MAXIPIME)** 1 G Intravenous Q8H First Dose Now for 9  Doses, Clinician Dir:HAP/VAP/HCAP PROTOCOL. ATTACH TO 100 ML NS MED PLUS. INFUSE FIRST DOSE OVER 30 MINUTES AND ALL SUBSEQUENT DOSES OVER 3 HOURS.           -   **  LACTULOSE (CEPHULAC)** 20 G Oral BID for 60 Days           -   **IPRATROPIUM/ALBUTEROL SULFATE (DUONEB)** 3 ML Inhalation Q4H for 60 Days           -   **PREDNISONE (DELTASONE)** 40 MG Oral QDAY First Dose Now for 60 Days           -   **GUAIFENESIN SYRUP** 200 MG Oral Q4HPRN COUGH for 60 Days           -   **REMOVE PATCH** 1 PATCH Miscellaneous QDAY for 60 Days, Clinician Dir:OLD NICOTINE PATCH           -   **PATCH INTACT QSHIFT (PATCH INTACT INSPECTION)** 1 PATCH Topical CHECK for 60 Days, Clinician FAO:[id number] (NICOTINE) PATCH EVERY SHIFT           -   **NICOTINE TRANSDERMAL (HABITROL TRANSDERMAL)** 21 MG Topical QDAY First Dose Now for 60 Days           -   **ASPIRIN CHEWABLE** 81 MG Oral QDAY for 60 Days           -   **CARBAMAZEPINE (TEGRETOL)** 400 MG Oral QAM for 60 Days           -   ** ** ** **INSULIN LISPRO (HUMALOG) (HUMALOG)******** Sliding Scale Subcutaneous TIDWM for 60 Days      For Glucose 80 to 150 Give 0 Units    For Glucose 151 to 200 Give 2 Units    For Glucose 201 to 250 Give 3 Units    For Glucose 251 to 300 Give 4 Units    Notify Physician if: Call HO for glucose > 300          -   **DORZOLAMIDE 2% OPHTH (TRUSOPT 2% OPHTH)** 1 DROP Both Eyes SOL BID for 60 Days           -   **BUDESONIDE INHALATION (PULMICORT RESPULES)** 0.25 MG Inhalation BID for 60 Days, Clinician QIO:[id number] FOR TRELEGY SEE ORDER #31           -   **MIRTAZAPINE (REMERON)** 15 MG Oral QHS for 60 Days           -   **CARBAMAZEPINE (TEGRETOL)** 600 MG Oral QHS for 60 Days           -   **HYDROCORTISONE 1% CREAM** 1 APP Topical BID for 60 Days, Clinician Dir:SEE EXTENDED INSTRUCTIONS INTERCHANGE FOR 2.5%           -   **ATORVASTATIN (LIPITOR)** 20 MG Oral QHS for 60 Days           -   **SENNA (SENOKOT)** 8.6 MG Oral BID for 60  Days           -   **BRIMONIDINE 0.2% OPHTH (ALPHAGAN 0.2% OPHTH)** 1 DROP Both Eyes SOL BID for 60 Days, Clinician GMW:[id number] FOR 0.15% STRENGTH           -   **OLANZAPINE (ZYPREXA)** 10 MG Oral QHS for 60 Days           -   **FORMOTEROL FUMARATE (PERFOROMIST)** 20 MCG Inhalation BID for 60 Days, Clinician IHK:[id number] FOR TRELEGY SEE ORDER #31           -   **DOCUSATE SODIUM (COLACE)** 100 MG Oral BID for 60 Days           -   **QUETIAPINE FUMARATE (SEROQUEL)** 400 MG Oral QHS for  60 Days           -   **GLUCAGON** 1 MG Intramuscular PRN BG < 60 AND NO IV ACCESS for 60 Days, Clinician Dir:MAY BE GIVEN IM OR SUB-Q           -   **DEXTROSE 50% SYRINGE (DEXTROSE 50%)** 12.5 G Intravenous PRN BG < 60 AND CAN NOT TAKE PO for 60 Days           -   **BISACODYL (DULCOLAX)** 10 MG Rectal QDAYPRN CONSTIPATION for 60 Days           -   **HEPARIN** 5000 UNITS Subcutaneous Q12H for 60 Days           -   **IPRATROPIUM/ALBUTEROL SULFATE (DUONEB)** 3 ML Inhalation Q4HPRN SOB/WHEEZING for 60 Days           -   **ALBUTEROL INHALER (VENTOLIN HFA)** 2 PUFF Inhalation Q4HPRN SHORTNESS OF BREATH for 60 Days           -   **ACETAMINOPHEN (TYLENOL)** 650 MG Oral Q6HPRN PAIN for 60 Days           -   **POLYETHYLENE GLYCOL (MIRALAX)** 17 G Oral QDAYPRN CONSTIPATION for 60 Days           -   **AMMONIUM LACTATE 12% LOTION (LAC-HYDRIN 12%)** 1 APP Topical QDAYPRN RASH for 60 Days           -   **BENZONATATE** 100 MG Oral TIDPRN COUGH for 60 Days           Pulmonology Assessment and Plan Comment COPD Exacerbation    Acute on Chronic Hypercapnic and Hypoxic Respiratory Failure    - afebrile and HDS overnight; 92-94% on RA this AM    - exam with improved air movement bilaterally. Trace LE edema and no JVD    - labs with normalization of electrolytes. Creatinine 2.44 from 2.60  previously    - CT reviewed with radiology and consistent with metastatic process. No  obvious pulmonary primary    - Will follow CT  abdomen. Ongoing discussions regarding inpatient  bronch/biopsy pending those results. Clinical improvement from yesterday.    PLAN    - continue prednisone 40 mg daily; decrease by 10 mg every 7 days until off    - we will follow CT abdomen; ongoing discussions regarding inpatient bronch  pending those results    - continue LABA/LAMA and ICS; PRN duonebs    - do not anticipate RHC for this patient given poor functional status and  poor candidacy for PHTN therapies    - oxygen goal > 88%; wean oxygen as tolerated    - patient should have outpatient pulm follow up within 1 month of discharge        Thank you for this consult. Seen and staffed with Dr. Brion Aliment. Attending  attestation to follow.        Liliana Cline    Pulmonary/Critical Care Fellow PGY-4.    **Assessment and Plan**    Medication              -   **HYDRALAZINE (APRESOLINE)** 10 MG Oral TID for 57 Days, Clinician KGU:RKYH SBP <90S. SEE ORDER #55.   Pending Date: 01/17/2020          -   **METOPROLOL XL (TOPROL XL)** 12.5 MG Oral BID for 60 Days, Clinician CWC:BJSE SBPS < 90   Pending Date: 01/17/2020          -   **  CEFEPIME (MAXIPIME)** 1 G Intravenous Q8H First Dose Now for 9 Doses, Clinician Dir:HAP/VAP/HCAP PROTOCOL. ATTACH TO 100 ML NS MED PLUS. INFUSE FIRST DOSE OVER 30 MINUTES AND ALL SUBSEQUENT DOSES OVER 3 HOURS.           -   **LACTULOSE (CEPHULAC)** 20 G Oral BID for 60 Days           -   **IPRATROPIUM/ALBUTEROL SULFATE (DUONEB)** 3 ML Inhalation Q4H for 60 Days           -   **PREDNISONE (DELTASONE)** 40 MG Oral QDAY First Dose Now for 60 Days           -   **GUAIFENESIN SYRUP** 200 MG Oral Q4HPRN COUGH for 60 Days           -   **REMOVE PATCH** 1 PATCH Miscellaneous QDAY for 60 Days, Clinician Dir:OLD NICOTINE PATCH           -   **PATCH INTACT QSHIFT (PATCH INTACT INSPECTION)** 1 PATCH Topical CHECK for 60 Days, Clinician EPP:[id number] (NICOTINE) PATCH EVERY SHIFT           -   **NICOTINE TRANSDERMAL (HABITROL TRANSDERMAL)**  21 MG Topical QDAY First Dose Now for 60 Days           -   **ASPIRIN CHEWABLE** 81 MG Oral QDAY for 60 Days           -   **CARBAMAZEPINE (TEGRETOL)** 400 MG Oral QAM for 60 Days           -   ** ** ** **INSULIN LISPRO (HUMALOG) (HUMALOG)******** Sliding Scale Subcutaneous TIDWM for 60 Days      For Glucose 80 to 150 Give 0 Units    For Glucose 151 to 200 Give 2 Units    For Glucose 201 to 250 Give 3 Units    For Glucose 251 to 300 Give 4 Units    Notify Physician if: Call HO for glucose > 300          -   **DORZOLAMIDE 2% OPHTH (TRUSOPT 2% OPHTH)** 1 DROP Both Eyes SOL BID for 60 Days           -   **BUDESONIDE INHALATION (PULMICORT RESPULES)** 0.25 MG Inhalation BID for 60 Days, Clinician ZYS:[id number] FOR TRELEGY SEE ORDER #31           -   **MIRTAZAPINE (REMERON)** 15 MG Oral QHS for 60 Days           -   **CARBAMAZEPINE (TEGRETOL)** 600 MG Oral QHS for 60 Days           -   **HYDROCORTISONE 1% CREAM** 1 APP Topical BID for 60 Days, Clinician Dir:SEE EXTENDED INSTRUCTIONS INTERCHANGE FOR 2.5%           -   **ATORVASTATIN (LIPITOR)** 20 MG Oral QHS for 60 Days           -   **SENNA (SENOKOT)** 8.6 MG Oral BID for 60 Days           -   **BRIMONIDINE 0.2% OPHTH (ALPHAGAN 0.2% OPHTH)** 1 DROP Both Eyes SOL BID for 60 Days, Clinician ATF:[id number] FOR 0.15% STRENGTH           -   **OLANZAPINE (ZYPREXA)** 10 MG Oral QHS for 60 Days           -   **FORMOTEROL FUMARATE (PERFOROMIST)** 20 MCG Inhalation BID for 60 Days, Clinician CWC:[id number] FOR  TRELEGY SEE ORDER #31           -   **DOCUSATE SODIUM (COLACE)** 100 MG Oral BID for 60 Days           -   **QUETIAPINE FUMARATE (SEROQUEL)** 400 MG Oral QHS for 60 Days           -   **GLUCAGON** 1 MG Intramuscular PRN BG < 60 AND NO IV ACCESS for 60 Days, Clinician Dir:MAY BE GIVEN IM OR SUB-Q           -   **DEXTROSE 50% SYRINGE (DEXTROSE 50%)** 12.5 G Intravenous PRN BG < 60 AND CAN NOT TAKE PO for 60 Days           -   **BISACODYL  (DULCOLAX)** 10 MG Rectal QDAYPRN CONSTIPATION for 60 Days           -   **HEPARIN** 5000 UNITS Subcutaneous Q12H for 60 Days           -   **IPRATROPIUM/ALBUTEROL SULFATE (DUONEB)** 3 ML Inhalation Q4HPRN SOB/WHEEZING for 60 Days           -   **ALBUTEROL INHALER (VENTOLIN HFA)** 2 PUFF Inhalation Q4HPRN SHORTNESS OF BREATH for 60 Days           -   **ACETAMINOPHEN (TYLENOL)** 650 MG Oral Q6HPRN PAIN for 60 Days           -   **POLYETHYLENE GLYCOL (MIRALAX)** 17 G Oral QDAYPRN CONSTIPATION for 60 Days           -   **AMMONIUM LACTATE 12% LOTION (LAC-HYDRIN 12%)** 1 APP Topical QDAYPRN RASH for 60 Days           -   **BENZONATATE** 100 MG Oral TIDPRN COUGH for 60 Days           Pulmonology Assessment and Plan Comment COPD Exacerbation    Acute on Chronic Hypercapnic and Hypoxic Respiratory Failure    - afebrile and HDS overnight; 92-94% on RA this AM    - exam with improved air movement bilaterally. Trace LE edema and no JVD    - labs with normalization of electrolytes. Creatinine 2.44 from 2.60  previously    - CT reviewed with radiology and consistent with metastatic process. No  obvious pulmonary primary    - Will follow CT abdomen. Ongoing discussions regarding inpatient  bronch/biopsy pending those results. Clinical improvement from yesterday.    PLAN    - continue prednisone 40 mg daily; decrease by 10 mg every 7 days until off    - we will follow CT abdomen; ongoing discussions regarding inpatient bronch  pending those results    - continue LABA/LAMA and ICS; PRN duonebs    - do not anticipate RHC for this patient given poor functional status and  poor candidacy for PHTN therapies    - oxygen goal > 88%; wean oxygen as tolerated    - patient should have outpatient pulm follow up within 1 month of discharge        Thank you for this consult. Seen and staffed with Dr. Brion Aliment. Attending  attestation to follow.        Liliana Cline    Pulmonary/Critical Care Fellow PGY-4.            **Electronically  signed by Rebeca Allegra, MD on 01/17/2020 18:45**        **Electronically signed by Rebeca Allegra, MD on 01/17/2020 18:45**

## 2020-01-10 NOTE — ED Provider Notes (Signed)
 [name]  Name: Arthur Young, Arthur Young  MRN: [id number]  Age: 54 yrs  Sex: Male  DOB: November 12, 1965  Arrival Date: 01/10/2020  Arrival Time: 15:51  Account#: [id number]  .  Working Diagnosis:  - Heart failure exacerbation  PCP: Berenice Primas  .  HPI:  09/18  1:59 54 year-old male with PMHx HFrEF (30%), COPD not on home O2,    vj        HTN, DM, schizoaffective disorder who presents from his group        home for progressive DOE, LE swelling, and difficulty        urinating. Patient is a poor historian, and collateral was        obtained from his group home manager. He has reportedly been        having these symptoms over the last several weeks. He lives on        the 2nd floor and has had progressive DOE with climbing the        stairs. Endorsed recent constipation as well (was prescribed        bowel regimen earlier this month). Denies recent fevers/chills,        CP, N/V/D, abdominal swelling or weight changes (though per        chart review his weight today is 81.6 kg and his dry weight is  75 kg). Reportedly compliant with his medications daily        without recent changes. .  .  Historical:  - Allergies: Depakote; Lithium Carbonate;  - PMHx: COPD; Hypertension; CHF; Diabetes-NIDDM;  - Social history: Smoking status: Patient uses tobacco    products, current every day smoker. Patient/guardian denies    using alcohol.  .  .  ROS:  17:30 Constitutional: Negative for chills, fever.                     vj  17:30 Eyes:  17:30 Neck  17:30 Cardiovascular: Positive for edema, Negative for chest pain,        palpitations.  17:30 Respiratory: Positive for dyspnea on exertion, wheezing,        Negative for cough.  17:30 Abdomen/GI: Positive for constipation, Negative for abdominal        pain, nausea, vomiting, diarrhea.  17:30 GU: Positive for difficulty urinating.  17:30 Neuro: Negative for altered mental status, dizziness.  .  Vital Signs:  15:55 BP 133 / 84; Pulse 88; Resp 24; Temp 36.2; Pulse Ox 98% on 2    dn8        lpm NC; Weight 81.65  kg (R);  16:19 BP 121 / 87; Pulse 93; Resp 20; Pulse Ox 97% on 2 lpm NC;       kd18  17:57 BP 117 / 88; Pulse 89; Resp 24; Pulse Ox 95% ;                  kd18  .  Name:Arthur Young, Arthur Young  [id number]  [id number]  Page 1 of 6  %%PAGE  .  Name: Arthur Young, Arthur Young  MRN: [id number]  Age: 76 yrs  Sex: Male  DOB: [birth year]  Arrival Date: 01/10/2020  Arrival Time: 15:51  Account#: [id number]  .  Working Diagnosis:  - Heart failure exacerbation  PCP: Berenice Primas  .  20:17 BP 119 / 85; Pulse 84 Monitor; Resp 24 Spontaneous; Temp        mc44        36.5(O); Pulse Ox 95% on  2 lpm NC;  .  Glasgow Coma Score:  15:55 Eye Response: spontaneous(4). Verbal Response: oriented(5).     dn8        Motor Response: obeys commands(6). Total: 15.  16:19 Eye Response: spontaneous(4). Verbal Response: oriented(5).     kd18        Motor Response: obeys commands(6). Total: 15.  20:19 Eye Response: spontaneous(4). Verbal Response: oriented(5).     mc44        Motor Response: obeys commands(6). Total: 15.  .  Exam:  17:33 Constitutional: The patient appears alert, awake, obese.        vj  17:33 Head/face: Exam is negative for obvious evidence of injury or        deformity.  17:33 Eyes: Extraocular movements: intact throughout.  17:33 Neck: Exam negative for No appreciable JVD.  17:33 Respiratory: the patient does not display signs of respiratory        distress,  Respirations: normal, Breath sounds: wheezing.  17:33 Cardiovascular: Rate: normal, Heart sounds: normal, Edema: 1+        edema to level of Bilateral lower extremities , JVD: is not        appreciated.  17:33 Abdomen/GI: Inspection: distension, Bowel sounds: normal,        Palpation: nontender.  17:33 Neuro: Exam negative for focal neuro deficits.  .  MDM:  18:31 ED course: S/p albuterol and Lasix 40mg  IV. Given history and   vj        labs notable for hyponatremia 127, BNP 1085 plan for inpatient        admission for CHF exacerbation. .  18:32 Resident chart complete and electronically  signed: Jarrett Ables.  [name]  09/01  16:21 Order name: CBC/Diff (With Plt)                                 vj  09/01  16:21 Order name: Blood Urea Nitrogen (Bun)                           vj  09/01  16:21 Order name: CR (Creatinine)                                     vj  09/01  16:21 Order name: Electrolytes (Na, K, Cl, Co2)                       vj  09/01  .  Name:Arthur Young, Arthur Young  [id number]  [id number]  Page 2 of 6  %%PAGE  .  Name: Arthur Young, Arthur Young  MRN: [id number]  Age: 71 yrs  Sex: Male  DOB: March 24, 1966  Arrival Date: 01/10/2020  Arrival Time: 15:51  Account#: [id number]  .  Working Diagnosis:  - Heart failure exacerbation  PCP: Berenice Primas  .  16:21 Order name: GLU (Glucose)                                       vj  09/01  16:21 Order name: Troponin I  vj  09/01  16:21 Order name: BNP (Brain Natriuretic Peptide)                     vj  09/01  16:51 Order name: GFR, AA                                             dispa  t  09/01  16:51 Order name: GFR, NAA                                            dispa  t  09/01  17:31 Order name: RBC Morph                                           dispa  t  09/01  17:37 Order name: Whole Blood Potassium                               vj  09/01  18:00 Order name: Calcium                                             dispa  t  09/01  18:12 Order name: Phosphorus                                          dispa  t  09/01  18:16 Order name: Magnesium                                           dispa  t  09/01  16:20 Order name: CXR (Portable)                                      vj  09/01  16:20 Order name: Adult EKG (order using folder); Complete Time: 16:21vj  09/01  18:36 Order name: Patient Belongings List; Complete Time: 19:33       kb30  09/01  18:37 Order name: COVID-19 PCR (Symptomatic patients)                 kd18  09/01  18:58 Order name: Magnesium                                           dispa  t  09/01  19:43 Order name:  COVID-19 (SARS-CoV-2) PCR RAPID                     dispa  t  .  Dispensed Medications:  16:54 Not Given (aersolized procedure. spoke to MD): DuoNeb 3 mL  tr11        Inhalation once  17:04 Drug: Albuterol HFA Inhaler 2 puffs Route: Inhalation;          kd18  17:51 Drug: Lasix - Furosemide 40 mg Route: IVP;                      kd18  .  [name]  Radiology Orders:  Order Name: CXR (Portable); Last Status: Returned; Time:    01/10/20 16:20; By: vj; For: vj; Order Method: Electronic;  .  Name:Arthur Young, Arthur Young  [id number]  [id number]  Page 3 of 6  %%PAGE  .  Name: Arthur Young, Arthur Young  MRN: [id number]  Age: 19 yrs  Sex: Male  DOB: 12-09-1965  Arrival Date: 01/10/2020  Arrival Time: 15:51  Account#: [id number]  .  Working Diagnosis:  - Heart failure exacerbation  PCP: Berenice Primas  .    Notes: Bed Name: A11  Attending Notes:  17:39 Attestation: Assessment and care plan reviewed with             fw2/s  b44        resident/midlevel provider. See their note for details. History        / physical exam by student reviewed, patient interviewed and        examined. I have reviewed Nurses Notes, Old Records in:.  17:39 Attending HPI: HPI: 54 year old male patient with PMHx HFrEF    fw2/s  b44        (30%), COPD, schizoaffective disorder, HTN, CHF, NIDDM        presenting from group home to ED for evaluation of a few weeks        progressive symptoms including DOE, lower extremity edema,        difficulty urinating. Patient was a poor historian, thus the        group home was called and they provided information about his        symptoms and their duration. Patient lives on the 2nd floor and        has had increased difficulty and DOE going up stairs to his        apartment. Patient denies fevers, abdominal swelling, chills,        CP, n/v/d. Patient endorses constipation for which he was seen        and treated in the recent past, and has a congested cough.        Patient has had some appreciable 6kg weight gain since Jan        2021, and  per patient he has had an increased SOB x 1 month.  17:44 Attending ROS Constitutional: No acute distress. Eyes: No       fw2/s  b44        visual complaints. ENT: No sore throat or rhinorrhea Neck:        Negative for signs trauma, or painful range of motion.        Cardiovascular: No chest pain Back: No focal tenderness. No        evidence of trauma. Respiratory: Positive for cough, dyspnea on        exertion, shortness of breath, Abdomen/GI: Positive for        constipation, Negative for abdominal pain, nausea, vomiting,        GU: Positive for urinary symptoms, difficulty urinating,        MS/Extremity: Positive for swelling, Edema.  17:46 Attending Exam: My personal exam reveals  Head: NC/AT Eyes:      fw2/s  b44        PEARL ENT: Moist mucous membranes, OP clear, TMs clear b/l.        Neck: Soft and supple, full ROM, no adenopathy, no c-spine        tenderness. Chest: No palpable tenderness, no e/o trauma.        Lungs: bibasilar rales, apices clear, no wheezes CVS: RRR,        normal S1 and S2, no m/r. Abdomen: Soft, nontender, no palpable        masses, no guarding or rebound. Extremities: Full ROM of all        joints, NVI. Chronic venous stasis changes bilaterally, pitting        edema to knees bilaterally Back: No palpable tenderness, no e/o        trauma. Skin: Intact, no e/o cellulitis. Neuro: Awake and        alert, no facial asymmetry.  17:46 Lab/Ancillary show: EKG interpreted by me and shows: Sinus 90,  fw2/s  b44        interval 0.16, 0.10, 0.40, axis 90 degrees, poor r wave        progression, no acute ischemia.  .  Name:Arthur Young, Arthur Young  [id number]  [id number]  Page 4 of 6  %%PAGE  .  Name: Arthur Young, Arthur Young  MRN: [id number]  Age: 37 yrs  Sex: Male  DOB: August 09, 1965  Arrival Date: 01/10/2020  Arrival Time: 15:51  Account#: [id number]  .  Working Diagnosis:  - Heart failure exacerbation  PCP: Berenice Primas  .  17:47 Lab/Ancillary show: Xray(s) visualized and interpreted by me    fw2/s  b44        CXR  shows: mild vascular redistribution, no clear        opacification, no pleural effusions.  17:50 Lab/Ancillary show: Labs were reviewed and interpreted by me:   fw2/s  b44        BUN 31, CR 1.85, GFR NAA 40, GFR AA 46, NA 127, CL 96, CO2 31,        Anion GAP 0, Troponin 0.04, RBC 5.83, HGB 18.2, HCT 59.9, MCV        102.7, MCHC 30.4, RDW 15.7, PLT Clumps Present.  18:45 Lab/Ancillary show: EKG interpreted by me and shows: Repeat     fw2/s  b44        EKG: Sinus 60, intervals of 0.14, 0.08, 0.12, axis 0 degrees,        no acute ischemia.  18:46 Lab/Ancillary show: Chest X-ray shows: FINDINGS/IMPRESSION:     fw2/s  b44        Lines/Tubes/Other: None. Heart/Mediastinum: The        cardiomediastinal silhouette is enlarged, similar to prior.        Lungs/Pleura: The lung volumes are diminished within the lower        lung zones. There are increased interstitial markings and        patchy opacities throughout the lungs which could represent        pulmonary edema, although an infectious process is a        consideration in the appropriate clinical context.        Re-evaluation in 6-8 weeks following appropriate therapy is        recommended to confirm resolution.. There is trace left        effusion. There is no right-sided effusion. There is no  pneumothorax. Bones: There are degenerative changes of the        spine.  20:22 ED Course: Patient present for evaluation of DOE. The Patient's fw2/s  b44        physical exam showed evidence of biventricular failure        manifested by rales in his lungs and pitting edema of his lower        extremities. Patient has elevated troponin and BNP and is        subsequently referred to medical service for continued        management. Diuresis was increased in ED. Patient understood        and agreed with his admission plan and was happy with his care.        My Working Impression: 1. NSTEMI 2. Heart Failure.  20:22 Scribe Scribe Chart Complete Scribe chart completed for Dr.      fw2/s  b44        Anola Gurney. I, Etheleen Nicks Bankowski am scribing for, and under the        direction of, Dr. Anola Gurney. Electronically signed by Alona Bene, 01/10/2020.  [name]  Disposition Summary:  01/10/20 18:34  Hospitalization Ordered        Hospitalization Status: Inpatient Admission                     vj        Provider: Linna Hoff        Location: Adult Floor                                           vj  .  Name:Arthur Young, Arthur Young  [id number]  [id number]  Page 5 of 6  %%PAGE  .  Name: Arthur Young, Arthur Young  MRN: [id number]  Age: 66 yrs  Sex: Male  DOB: Sep 02, 1965  Arrival Date: 01/10/2020  Arrival Time: 15:51  Account#: [id number]  .  Working Diagnosis:  - Heart failure exacerbation  PCP: Berenice Primas  .        Condition: Stable                                               vj        Problem: an acute exacerbation                                  vj        Symptoms: have worsened                                         vj        Bed/Room Type: Regular  vj        Room Assignment: [facility] 8(01/10/20 20:15)                        [id number]        Diagnosis          - Heart failure exacerbation                                  vj        Forms:          - Handoff Communication Form                                  vj          - Fax Summary                                                 vj  Signatures:  Dispatcher, [employer]                          dispa  Fredric Dine                            Reg  dw5  Donnamarie Poag                           RN   [address] [place]                          kb30  Marga Melnick                         MD   vj  Lurlean Leyden, [place]                          RN   tr11  Shyrl Numbers, Domenic                       RN   dn8  Alona Bene, Scribe              [phone]  Lutricia Horsfall                                  [phone]  .  Corrections: (The following items were deleted from the chart)  17:28 10:35 54 year-old male  with PMHx . vj                           vj  17:46 17:39 Attending HPI: HPI: fw2/sb44                              fw2/s  b44  18:07 17:46 Attending Exam: My personal exam reveals fw2/sb44         fw2/s  b44  18:36 18:34 vj  kb30  20:15 18:36 *PENDING BED* kb30                                        lr15  20:48 20:22 ED Course: fw2/sb44                                       fw2/s  b44  20:48 20:22 My Working Impression: 1. fw2/sb44                        fw2/s  b44  .  Document is preliminary until electronically or manually signed by the atte  nding physician  .  .  .  .  .  .  .  .  .  .  .  .  Name:Arthur Young, Arthur Young  [id number]  [id number]  Page 6 of 6  .  %%END

## 2020-01-10 NOTE — Progress Notes (Signed)
 **Subjective**    Subjective Overnight:    - No Acute Events overnight        This AM:    - Patient appeared more at baseline this mornign when speaking to him    - Very happy to see doctor this morning and in a good mood    - Denies chest pain, nausea, vomiting, diarrhea, and headache.    Tolerating Diet Yes.    **Subjective**    Subjective Overnight:    - No Acute Events overnight        This AM:    - Patient appeared more at baseline this mornign when speaking to him    - Very happy to see doctor this morning and in a good mood    - Denies chest pain, nausea, vomiting, diarrhea, and headache.    Tolerating Diet Yes.    **Subjective**    Subjective Overnight:    - No Acute Events overnight        This AM:    - Patient appeared more at baseline this mornign when speaking to him    - Very happy to see doctor this morning and in a good mood    - Denies chest pain, nausea, vomiting, diarrhea, and headache.    Tolerating Diet Yes.    **ROS**    Complete Review of Systems All other systems reviewed and negative except as  noted in the HPI.    **ROS**    Complete Review of Systems All other systems reviewed and negative except as  noted in the HPI.    **ROS**    Complete Review of Systems All other systems reviewed and negative except as  noted in the HPI.    **Exam**    Comment Temp 38, HR 90s, BP 90s-130s/70s-80s, 2L NC with intermittent BiPAP    Weight: 79.6 (9/6) -> 76.4 (9/7) -> 74.8 (9/9)        Gen: NAD, Lying in bed    HEENT: PERRL, unable to evaluate for JVD given body habitus +Mild Scleral  Icterus    CV: RRR, no murmurs, rubs, gallops    Lung: +Diffuse Bilateral Crackles with worsening wheezing    Abd: Soft, Nontender, no rebound, no guarding +Distended, +BS    Ext: WWP +Bilateral lower extremity edema to the shins Right > Left, decreased  from prior but trace edema present    Neuro: +intermittent tremor of upper/lower extremities +5/5 Extremity  strengths.      Vital Signs    01/18/2020 11:05               -  Temperature: 37.3 (35-37.8Cel)          -  Site: Oral          -  Heart Rate: 87 (60-90)          -  Site: Monitor          -  BP: 115/75 (90-140/60-90)          -  Site: Left Arm          -  Position: Sitting          -  Method: Automated          -  Respirations: 18 (12-20)          -  O2 Saturation (%): 92          -  O2 Delivery Method: Room Air          -  MEWS Vital Sign Score: 0      01/18/2020 09:29              -  O2 Amount: 2.0 LPM          -  Morse Fall Risk Total: 100      01/18/2020 04:26              -  How Obtained: Bed Scale          -  Weight: 74.8kg      01/17/2020 11:30              -  BP #2: 91/73 (90-140/60-90)          -  Method: Automated      CFS Vital Signs CS    01/18/2020 22:59              -  Shift Intake Total:          -  Shift Output Total: 0ml          -  Shift Balance:          I have reviewed and agree with vital signs as listed in the EMR Yes Time  01/18/2020 15:55.    Continuous Blood Pressure Yes.    **Exam**    Comment Temp 38, HR 90s, BP 90s-130s/70s-80s, 2L NC with intermittent BiPAP    Weight: 79.6 (9/6) -> 76.4 (9/7) -> 74.8 (9/9)        Gen: NAD, Lying in bed    HEENT: PERRL, unable to evaluate for JVD given body habitus +Mild Scleral  Icterus    CV: RRR, no murmurs, rubs, gallops    Lung: +Diffuse Bilateral Crackles with worsening wheezing    Abd: Soft, Nontender, no rebound, no guarding +Distended, +BS    Ext: WWP +Bilateral lower extremity edema to the shins Right > Left, decreased  from prior but trace edema present    Neuro: +intermittent tremor of upper/lower extremities +5/5 Extremity  strengths.      Vital Signs    01/18/2020 11:05              -  Temperature: 37.3 (35-37.8Cel)          -  Site: Oral          -  Heart Rate: 87 (60-90)          -  Site: Monitor          -  BP: 115/75 (90-140/60-90)          -  Site: Left Arm          -  Position: Sitting          -  Method: Automated          -   Respirations: 18 (12-20)          -  O2 Saturation (%): 92          -  O2 Delivery Method: Room Air          -  MEWS Vital Sign Score: 0      01/18/2020 09:29              -  O2 Amount: 2.0 LPM          -  Morse Fall Risk Total: 100      01/18/2020 04:26              -  How  Obtained: Bed Scale          -  Weight: 74.8kg      01/17/2020 11:30              -  BP #2: 91/73 (90-140/60-90)          -  Method: Automated      CFS Vital Signs CS    01/18/2020 22:59              -  Shift Intake Total:          -  Shift Output Total: 0ml          -  Shift Balance:          I have reviewed and agree with vital signs as listed in the EMR Yes Time  01/18/2020 15:55.    Continuous Blood Pressure Yes.    **Exam**    Comment Temp 38, HR 90s, BP 90s-130s/70s-80s, 2L NC with intermittent BiPAP    Weight: 79.6 (9/6) -> 76.4 (9/7) -> 74.8 (9/9)        Gen: NAD, Lying in bed    HEENT: PERRL, unable to evaluate for JVD given body habitus +Mild Scleral  Icterus    CV: RRR, no murmurs, rubs, gallops    Lung: +Diffuse Bilateral Crackles with worsening wheezing    Abd: Soft, Nontender, no rebound, no guarding +Distended, +BS    Ext: WWP +Bilateral lower extremity edema to the shins Right > Left, decreased  from prior but trace edema present    Neuro: +intermittent tremor of upper/lower extremities +5/5 Extremity  strengths.      Vital Signs    01/18/2020 11:05              -  Temperature: 37.3 (35-37.8Cel)          -  Site: Oral          -  Heart Rate: 87 (60-90)          -  Site: Monitor          -  BP: 115/75 (90-140/60-90)          -  Site: Left Arm          -  Position: Sitting          -  Method: Automated          -  Respirations: 18 (12-20)          -  O2 Saturation (%): 92          -  O2 Delivery Method: Room Air          -  MEWS Vital Sign Score: 0      01/18/2020 09:29              -  O2 Amount: 2.0 LPM          -  Morse Fall Risk Total: 100      01/18/2020 04:26               -  How Obtained: Bed Scale          -  Weight: 74.8kg      01/17/2020 11:30              -  BP #2: 91/73 (90-140/60-90)          -  Method: Automated      CFS Vital Signs CS    01/18/2020 22:59              -  Shift Intake Total:          -  Shift Output Total: 0ml          -  Shift Balance:          I have reviewed and agree with vital signs as listed in the EMR Yes Time  01/18/2020 15:55.    Continuous Blood Pressure Yes.    **[employer] Discussed with Patient and/or Family Yes.    All current labs have been reviewed by myself as of 01/18/2020 15:56.    **[employer] Discussed with Patient and/or Family Yes.    All current labs have been reviewed by myself as of 01/18/2020 15:56.    **[employer] Discussed with Patient and/or Family Yes.    All current labs have been reviewed by myself as of 01/18/2020 15:56.    **Radiology**    All current radiographs have been reviewed by myself as of 01/18/2020 15:56.    **Radiology**    All current radiographs have been reviewed by myself as of 01/18/2020 15:56.    **Radiology**    All current radiographs have been reviewed by myself as of 01/18/2020 15:56.    **Assessment and Plan**    Medication              -   **METOPROLOL (LOPRESSOR)** 25 MG Oral BID for 60 Days   Pending Date: 01/18/2020          -   **FUROSEMIDE (LASIX)** 40 MG Oral QDAY First Dose Now for 60 Days           -   **HYDRALAZINE (APRESOLINE)** 10 MG Oral TID for 57 Days, Clinician ZOX:WRUE SBP <90S. SEE ORDER #55.           -   **CEFEPIME (MAXIPIME)** 1 G Intravenous Q8H First Dose Now for 9 Doses, Clinician Dir:HAP/VAP/HCAP PROTOCOL. ATTACH TO 100 ML NS MED PLUS. INFUSE FIRST DOSE OVER 30 MINUTES AND ALL SUBSEQUENT DOSES OVER 3 HOURS.           -   **LACTULOSE (CEPHULAC)** 20 G Oral BID for 60 Days           -   **IPRATROPIUM/ALBUTEROL SULFATE (DUONEB)** 3 ML Inhalation Q4H for 60 Days           -   **PREDNISONE (DELTASONE)** 40 MG Oral QDAY First Dose Now for 60  Days           -   **GUAIFENESIN SYRUP** 200 MG Oral Q4HPRN COUGH for 60 Days           -   **REMOVE PATCH** 1 PATCH Miscellaneous QDAY for 60 Days, Clinician Dir:OLD NICOTINE PATCH           -   **PATCH INTACT QSHIFT (PATCH INTACT INSPECTION)** 1 PATCH Topical CHECK for 60 Days, Clinician AVW:[id number] (NICOTINE) PATCH EVERY SHIFT           -   **NICOTINE TRANSDERMAL (HABITROL TRANSDERMAL)** 21 MG Topical QDAY First Dose Now for 60 Days           -   **ASPIRIN CHEWABLE** 81 MG Oral QDAY for 60 Days           -   **CARBAMAZEPINE (TEGRETOL)** 400 MG Oral QAM for 60 Days           -   ** ** ** **INSULIN LISPRO (HUMALOG) (HUMALOG)******** Sliding Scale Subcutaneous TIDWM for  60 Days      For Glucose 80 to 150 Give 0 Units    For Glucose 151 to 200 Give 2 Units    For Glucose 201 to 250 Give 3 Units    For Glucose 251 to 300 Give 4 Units    Notify Physician if: Call HO for glucose > 300          -   **BUDESONIDE INHALATION (PULMICORT RESPULES)** 0.25 MG Inhalation BID for 60 Days, Clinician ZOX:[id number] FOR TRELEGY SEE ORDER #31           -   **SENNA (SENOKOT)** 8.6 MG Oral BID for 60 Days           -   **HYDROCORTISONE 1% CREAM** 1 APP Topical BID for 60 Days, Clinician Dir:SEE EXTENDED INSTRUCTIONS INTERCHANGE FOR 2.5%           -   **ATORVASTATIN (LIPITOR)** 20 MG Oral QHS for 60 Days           -   **FORMOTEROL FUMARATE (PERFOROMIST)** 20 MCG Inhalation BID for 60 Days, Clinician XBJ:[id number] FOR TRELEGY SEE ORDER #31           -   **CARBAMAZEPINE (TEGRETOL)** 600 MG Oral QHS for 60 Days           -   **BRIMONIDINE 0.2% OPHTH (ALPHAGAN 0.2% OPHTH)** 1 DROP Both Eyes SOL BID for 60 Days, Clinician QMV:[id number] FOR 0.15% STRENGTH           -   **QUETIAPINE FUMARATE (SEROQUEL)** 400 MG Oral QHS for 60 Days           -   **DOCUSATE SODIUM (COLACE)** 100 MG Oral BID for 60 Days           -   **OLANZAPINE (ZYPREXA)** 10 MG Oral QHS for 60 Days           -   **MIRTAZAPINE  (REMERON)** 15 MG Oral QHS for 60 Days           -   **DORZOLAMIDE 2% OPHTH (TRUSOPT 2% OPHTH)** 1 DROP Both Eyes SOL BID for 60 Days           -   **DEXTROSE 50% SYRINGE (DEXTROSE 50%)** 12.5 G Intravenous PRN BG < 60 AND CAN NOT TAKE PO for 60 Days           -   **GLUCAGON** 1 MG Intramuscular PRN BG < 60 AND NO IV ACCESS for 60 Days, Clinician Dir:MAY BE GIVEN IM OR SUB-Q           -   **BISACODYL (DULCOLAX)** 10 MG Rectal QDAYPRN CONSTIPATION for 60 Days           -   **HEPARIN** 5000 UNITS Subcutaneous Q12H for 60 Days           -   **IPRATROPIUM/ALBUTEROL SULFATE (DUONEB)** 3 ML Inhalation Q4HPRN SOB/WHEEZING for 60 Days           -   **BENZONATATE** 100 MG Oral TIDPRN COUGH for 60 Days           -   **AMMONIUM LACTATE 12% LOTION (LAC-HYDRIN 12%)** 1 APP Topical QDAYPRN RASH for 60 Days           -   **ALBUTEROL INHALER (VENTOLIN HFA)** 2 PUFF Inhalation Q4HPRN SHORTNESS OF BREATH for 60 Days           -   **ACETAMINOPHEN (TYLENOL)** 650 MG Oral Q6HPRN PAIN  for 60 Days           -   **POLYETHYLENE GLYCOL (MIRALAX)** 17 G Oral QDAYPRN CONSTIPATION for 60 Days           Comment Arthur Young is a 54 yo male with history of HFrEF (EF 30%  05/2019), COPD not on home O2, HTN, DM, and, Schizoaffective disorder  presenting from a group home for shortness of breath.        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 History of previous admissions for CHF exacerbation requiring IV  Diuresis. Previous dry weight of 75kg with most recent weight 81kg in the ED.  Etiology of exacerbation unclear, but probable medication non-adherence given  poot historian. Spoke to patient with his daughter Donnie Aho) present on (9/9)  regarding CHF/COPD exacerbation and possible malignancy with subsequent steps  -- voiced understanding and that she would be the most appropriate HCP.     PRELOAD: Continued fluid overloaded on exam with lower extremity edema and CXR  notable for increased pulmonary vascular congestion with left pleural effusion    - Home 40mg  Lasix PO    --- Goal 0.5 - 1.0L negative as patient approaching euvolemia    - Strict I&O, Daily standing weights    -- CXR (9/6) without evidence of worsening pulmonary edema or pulmonary  vascular congestion    PUMP    Echo 09/02 with normal sized LV with normal LV thickness and severely reduced  LV systolic function (EF 20-25%), Flattened septum consistent with RV  pressure/volume overload, Severe global hypokinesis of LV; Right Ventricle  moderate to severely dilated with severely reduced function, trace MR, severe  TR    - Metroprolol at low dose 25mg  bid    - Repeat inpatient TTE as closer to euvolemia    AFTERLOAD    - Hydralazine 10mg  TID    CORONARIES    - Continue home ASA and Atorvastatin 80mg     Neurohormonal: Previously discontinued Spironolactone for hyperkalemia in the  setting of K-sparing diuretics    -- Can consider Spironolactone for GDMT if hyperkalemia stable        #Acute COPD Exacerbation - PFTS (07/21/19) FEV1 24% with FEV1/FVC 40% GOLD IV.  Pulm previously wanted outpatient CT Chest, as patient is inpatient and being  actively diuresed with changes in mental status, will go ahead and pursue  inpatient CT Chest. SSA/SSB, RF per previous Pulm note Negative. Tmax 38.0,  BCx drawn (NGTD), and SBPs in 90s. Afebrile as of (9/9) without leukocytosis.  Pseudomonas on sputum Cx    - Cefepime 1g Q24H (9/8 - )    - s/p Vancomycin Dose by Level (9/7 - 9/9)    - s/p Ceftriaxone 1g Q24H (9/7-9/8)    - Sputum Cx - Pseudomonas    - Prednisone 40mg  qd 5 days (9/7 - ), (s/p 9/1-9/5)    ---- s/p Azithromycin 500mg  IV Q24H (9/2-3)    -- Duonebs Q4H    -- Pulmonary c/s -- recs appreciated    - continue prednisone 40 mg daily; decrease by 10 mg every 7 days until off    - we will follow CT abdomen; ongoing discussions regarding inpatient  bronch  pending those results    - continue LABA/LAMA and ICS; PRN duonebs    - do not anticipate RHC  for this patient given poor functional status and  poor candidacy for PHTN therapies    - definitive biopsy would likely be a VATS wedge -- no primary tumor on CT  A/P    - oxygen goal > 88%; wean oxygen as tolerated    - Replete KCl PO while diuresing -- replete Cl to support CO2 lowering  -- D/C Diamox    ---> Goal K 4.5-5.0    -- Repeat TTE when euvolemic    -- Cont. BiPAP and Duonebs        #AKI    Cr 1.85 on admission, baseline Cr possibly ~1.2-1.3. AKI probable cardiorenal  in the setting of CHF exacerbation, 1.67 (9/5), will decrease magnitude of  diuresis as patient is approaching a euvolemic state. Creatinine (9/5) 1.83 ->  2.02 -> 1.99 (9/6) -> 2.55 (9/7) -> 1.92 (9/9). Etiology of AKI most likely  2/2 new urinary retention as found on RUQ Ultrasound (9/7), less likely  prerenal given continued diuresis, possibly massive diuresis of negative 6L  took time to be reflected in his kidney function. Patient cathed 2x overnight  with over 1.5L out. Placed foley catheter (9/8) to allow continuous post-  obstruction alleviation. CT A/P w/ PO Contrast Impression noted no definite  evidence of malignancy or infection is identified in the abdomen and pelvis.  Enlarged and heterogenous prostate. PSA WNL.    - Foley Catheter    - Monitor SCr    - Home 40mg  PO Lasix        #Pulmonay Nodules - CT Chest Impression (9/7) noted new innumerable bilateral  pulmonary nodules are concerning for metastasis. CT of the abdomen and pelvis  with IV contrast is recommended for further evaluation with a small focal  lucency in the manubrium of the sternum could represent a lytic metastasis.  Given possible metastatis, will look for primary cancer site    - Definitive biopsy would likely be a VATS wedge -- no primary tumor on CT  A/P        #Altered Mental Status -- (9/6) morning patient was obtunded and responsive to  sternal  rub. Sent patient for CT Head to rule out intracranial pathology.  Returned significantly improved mental status, but had some right upper  extremity rythmic jerking. Neuro was consulted and do not believe he was  having a focal seizure and without focal neurologic deficits. Possibly  secondary to medication, but unsure why it is worsening during his admission  as we've improved his fluid status. Improved towards baseline on (9/7) and  will continue BiPAP. NH3 79 on (9/7) with elevated Alk Phos, GGT, and LDH  without transaminitis or hyperbilirubinemia.    - Lactulose -- promote bowel movement and decrease serum NH3    - BiPAP as needed    - Neuro c/s -- recs appreciated    -- No acute interventions        #Anemia    #Polycythemia    # Iron Deficiency Anemia    Hgb 18.2 and Hct 59.9 possibly 2/2 COPD vs possible OSA. Can consider empiric  treatment of OSA with trial of CPAP if patient desaturates overnight. Iron  studies significant for Iron Deficiency anemia Iron 47, %Fe Saturation 12,  Ferritin 68, Transferrin 270. 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    - IV Venofer iso heart failure    - Consider outpatient workup (JAK2 testing and BM)    - Lower Extremity Ultrasound  negative for DVT    - Outpatient sleep study        #Cholestatic Liver Enzymes -- Elevated Alk Phos and GGT. Unknown if patient  has Hx of cirrhosis although with normal AST/ALT, less likely, but would  prefer inpatient imaging to assess. Eyes appear slightly icteric (9/6) PM  without significant elevation of Bilirubin.    - RUQ Ultrasound (9/6) Impression noted Massive distention of the urinary  bladder with estimated volume of 1.5 L and Unremarkable appearance of the  liver    -- Foley Catheter for urinary retention        #Tremor -- likely 2/2 to medication as it was present on admission with normal  glucose levels. (9/3) AM patient was hypoglycemic to 36, resolution of  hypoglycemia improved  baseline tremor    -- Carbamazepine Level (9/6) and (9/8) - WNL    -- Monitor blood glucose, insulin changes as detailed below    -- continue outpatient psychiatric medications        #Constipation    Pt has hx of constipation and reports constipation on day of admission for the  past several days    - Lactulose    - Senna BID    - cont home miralax and colace        #DM    On Metformin outpatient. Episode of hypoglycemia to 36 (9/3) AM. D/Ced Lantus  and 4u Lispro    - A1c 7.5    - SSI TIDWM    - hold home metformin        Chronic:    - Schizoaffective disorder: Continue home Carbamazepine 400 mg qam and 600 mg  qhs, Mirtazapine 15 mg qhs, Zyprexa 10 mg qhs, Seroquel 300 mg qhs    - HLD: Continue home atorvastatin        #Social: Pt lives in a group home        Full code (confirmed)    HH Diet    SQ heparin    Daughter: Donnie Aho Keo: [phone].    **Assessment and Plan**    Medication              -   **METOPROLOL (LOPRESSOR)** 25 MG Oral BID for 60 Days   Pending Date: 01/18/2020          -   **FUROSEMIDE (LASIX)** 40 MG Oral QDAY First Dose Now for 60 Days           -   **HYDRALAZINE (APRESOLINE)** 10 MG Oral TID for 57 Days, Clinician HQI:ONGE SBP <90S. SEE ORDER #55.           -   **CEFEPIME (MAXIPIME)** 1 G Intravenous Q8H First Dose Now for 9 Doses, Clinician Dir:HAP/VAP/HCAP PROTOCOL. ATTACH TO 100 ML NS MED PLUS. INFUSE FIRST DOSE OVER 30 MINUTES AND ALL SUBSEQUENT DOSES OVER 3 HOURS.           -   **LACTULOSE (CEPHULAC)** 20 G Oral BID for 60 Days           -   **IPRATROPIUM/ALBUTEROL SULFATE (DUONEB)** 3 ML Inhalation Q4H for 60 Days           -   **PREDNISONE (DELTASONE)** 40 MG Oral QDAY First Dose Now for 60 Days           -   **GUAIFENESIN SYRUP** 200 MG Oral Q4HPRN COUGH for 60 Days           -   **REMOVE PATCH** 1 PATCH  Miscellaneous QDAY for 60 Days, Clinician Dir:OLD NICOTINE PATCH           -   **PATCH INTACT QSHIFT (PATCH INTACT INSPECTION)** 1 PATCH Topical CHECK for 60  Days, Clinician ZOX:[id number] (NICOTINE) PATCH EVERY SHIFT           -   **NICOTINE TRANSDERMAL (HABITROL TRANSDERMAL)** 21 MG Topical QDAY First Dose Now for 60 Days           -   **ASPIRIN CHEWABLE** 81 MG Oral QDAY for 60 Days           -   **CARBAMAZEPINE (TEGRETOL)** 400 MG Oral QAM for 60 Days           -   ** ** ** **INSULIN LISPRO (HUMALOG) (HUMALOG)******** Sliding Scale Subcutaneous TIDWM for 60 Days      For Glucose 80 to 150 Give 0 Units    For Glucose 151 to 200 Give 2 Units    For Glucose 201 to 250 Give 3 Units    For Glucose 251 to 300 Give 4 Units    Notify Physician if: Call HO for glucose > 300          -   **BUDESONIDE INHALATION (PULMICORT RESPULES)** 0.25 MG Inhalation BID for 60 Days, Clinician UJW:[id number] FOR TRELEGY SEE ORDER #31           -   **SENNA (SENOKOT)** 8.6 MG Oral BID for 60 Days           -   **HYDROCORTISONE 1% CREAM** 1 APP Topical BID for 60 Days, Clinician Dir:SEE EXTENDED INSTRUCTIONS INTERCHANGE FOR 2.5%           -   **ATORVASTATIN (LIPITOR)** 20 MG Oral QHS for 60 Days           -   **FORMOTEROL FUMARATE (PERFOROMIST)** 20 MCG Inhalation BID for 60 Days, Clinician OZH:[id number] FOR TRELEGY SEE ORDER #31           -   **CARBAMAZEPINE (TEGRETOL)** 600 MG Oral QHS for 60 Days           -   **BRIMONIDINE 0.2% OPHTH (ALPHAGAN 0.2% OPHTH)** 1 DROP Both Eyes SOL BID for 60 Days, Clinician XBM:[id number] FOR 0.15% STRENGTH           -   **QUETIAPINE FUMARATE (SEROQUEL)** 400 MG Oral QHS for 60 Days           -   **DOCUSATE SODIUM (COLACE)** 100 MG Oral BID for 60 Days           -   **OLANZAPINE (ZYPREXA)** 10 MG Oral QHS for 60 Days           -   **MIRTAZAPINE (REMERON)** 15 MG Oral QHS for 60 Days           -   **DORZOLAMIDE 2% OPHTH (TRUSOPT 2% OPHTH)** 1 DROP Both Eyes SOL BID for 60 Days           -   **DEXTROSE 50% SYRINGE (DEXTROSE 50%)** 12.5 G Intravenous PRN BG < 60 AND CAN NOT TAKE PO for 60 Days           -   **GLUCAGON** 1 MG  Intramuscular PRN BG < 60 AND NO IV ACCESS for 60 Days, Clinician Dir:MAY BE GIVEN IM OR SUB-Q           -   **BISACODYL (DULCOLAX)** 10 MG Rectal QDAYPRN CONSTIPATION for 60 Days           -   **  HEPARIN** 5000 UNITS Subcutaneous Q12H for 60 Days           -   **IPRATROPIUM/ALBUTEROL SULFATE (DUONEB)** 3 ML Inhalation Q4HPRN SOB/WHEEZING for 60 Days           -   **BENZONATATE** 100 MG Oral TIDPRN COUGH for 60 Days           -   **AMMONIUM LACTATE 12% LOTION (LAC-HYDRIN 12%)** 1 APP Topical QDAYPRN RASH for 60 Days           -   **ALBUTEROL INHALER (VENTOLIN HFA)** 2 PUFF Inhalation Q4HPRN SHORTNESS OF BREATH for 60 Days           -   **ACETAMINOPHEN (TYLENOL)** 650 MG Oral Q6HPRN PAIN for 60 Days           -   **POLYETHYLENE GLYCOL (MIRALAX)** 17 G Oral QDAYPRN CONSTIPATION for 60 Days           Comment [name] is a 54 yo male with history of HFrEF (EF 30%  05/2019), COPD not on home O2, HTN, DM, and, Schizoaffective disorder  presenting from a group home for shortness of breath.        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 History of previous admissions for CHF exacerbation requiring IV  Diuresis. Previous dry weight of 75kg with most recent weight 81kg in the ED.  Etiology of exacerbation unclear, but probable medication non-adherence given  poot historian. Spoke to patient with his daughter Donnie Aho) present on (9/9)  regarding CHF/COPD exacerbation and possible malignancy with subsequent steps  -- voiced understanding and that she would be the most appropriate HCP.    PRELOAD: Continued fluid overloaded on exam with lower extremity edema and CXR  notable for increased pulmonary vascular congestion with left pleural effusion    - Home 40mg  Lasix PO    --- Goal 0.5 - 1.0L negative as patient approaching euvolemia    - Strict I&O, Daily standing weights    --  CXR (9/6) without evidence of worsening pulmonary edema or pulmonary  vascular congestion    PUMP    Echo 09/02 with normal sized LV with normal LV thickness and severely reduced  LV systolic function (EF 20-25%), Flattened septum consistent with RV  pressure/volume overload, Severe global hypokinesis of LV; Right Ventricle  moderate to severely dilated with severely reduced function, trace MR, severe  TR    - Metroprolol at low dose 25mg  bid    - Repeat inpatient TTE as closer to euvolemia    AFTERLOAD    - Hydralazine 10mg  TID    CORONARIES    - Continue home ASA and Atorvastatin 80mg     Neurohormonal: Previously discontinued Spironolactone for hyperkalemia in the  setting of K-sparing diuretics    -- Can consider Spironolactone for GDMT if hyperkalemia stable        #Acute COPD Exacerbation - PFTS (07/21/19) FEV1 24% with FEV1/FVC 40% GOLD IV.  Pulm previously wanted outpatient CT Chest, as patient is inpatient and being  actively diuresed with changes in mental status, will go ahead and pursue  inpatient CT Chest. SSA/SSB, RF per previous Pulm note Negative. Tmax 38.0,  BCx drawn (NGTD), and SBPs in 90s. Afebrile as of (9/9) without leukocytosis.  Pseudomonas on sputum Cx    - Cefepime  1g Q24H (9/8 - )    - s/p Vancomycin Dose by Level (9/7 - 9/9)    - s/p Ceftriaxone 1g Q24H (9/7-9/8)    - Sputum Cx - Pseudomonas    - Prednisone 40mg  qd 5 days (9/7 - ), (s/p 9/1-9/5)    ---- s/p Azithromycin 500mg  IV Q24H (9/2-3)    -- Duonebs Q4H    -- Pulmonary c/s -- recs appreciated    - continue prednisone 40 mg daily; decrease by 10 mg every 7 days until off    - we will follow CT abdomen; ongoing discussions regarding inpatient bronch  pending those results    - continue LABA/LAMA and ICS; PRN duonebs    - do not anticipate RHC for this patient given poor functional status and  poor candidacy for PHTN therapies    - definitive biopsy would likely be a VATS wedge -- no primary tumor on CT  A/P    - oxygen goal > 88%; wean  oxygen as tolerated    - Replete KCl PO while diuresing -- replete Cl to support CO2 lowering  -- D/C Diamox    ---> Goal K 4.5-5.0    -- Repeat TTE when euvolemic    -- Cont. BiPAP and Duonebs        #AKI    Cr 1.85 on admission, baseline Cr possibly ~1.2-1.3. AKI probable cardiorenal  in the setting of CHF exacerbation, 1.67 (9/5), will decrease magnitude of  diuresis as patient is approaching a euvolemic state. Creatinine (9/5) 1.83 ->  2.02 -> 1.99 (9/6) -> 2.55 (9/7) -> 1.92 (9/9). Etiology of AKI most likely  2/2 new urinary retention as found on RUQ Ultrasound (9/7), less likely  prerenal given continued diuresis, possibly massive diuresis of negative 6L  took time to be reflected in his kidney function. Patient cathed 2x overnight  with over 1.5L out. Placed foley catheter (9/8) to allow continuous post-  obstruction alleviation. CT A/P w/ PO Contrast Impression noted no definite  evidence of malignancy or infection is identified in the abdomen and pelvis.  Enlarged and heterogenous prostate. PSA WNL.    - Foley Catheter    - Monitor SCr    - Home 40mg  PO Lasix        #Pulmonay Nodules - CT Chest Impression (9/7) noted new innumerable bilateral  pulmonary nodules are concerning for metastasis. CT of the abdomen and pelvis  with IV contrast is recommended for further evaluation with a small focal  lucency in the manubrium of the sternum could represent a lytic metastasis.  Given possible metastatis, will look for primary cancer site    - Definitive biopsy would likely be a VATS wedge -- no primary tumor on CT  A/P        #Altered Mental Status -- (9/6) morning patient was obtunded and responsive to  sternal rub. Sent patient for CT Head to rule out intracranial pathology.  Returned significantly improved mental status, but had some right upper  extremity rythmic jerking. Neuro was consulted and do not believe he was  having a focal seizure and without focal neurologic deficits. Possibly  secondary to  medication, but unsure why it is worsening during his admission  as we've improved his fluid status. Improved towards baseline on (9/7) and  will continue BiPAP. NH3 79 on (9/7) with elevated Alk Phos, GGT, and LDH  without transaminitis or hyperbilirubinemia.    - Lactulose -- promote bowel movement and decrease serum NH3    -  BiPAP as needed    - Neuro c/s -- recs appreciated    -- No acute interventions        #Anemia    #Polycythemia    # Iron Deficiency Anemia    Hgb 18.2 and Hct 59.9 possibly 2/2 COPD vs possible OSA. Can consider empiric  treatment of OSA with trial of CPAP if patient desaturates overnight. Iron  studies significant for Iron Deficiency anemia Iron 47, %Fe Saturation 12,  Ferritin 68, Transferrin 270. 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    - IV Venofer iso heart failure    - Consider outpatient workup (JAK2 testing and BM)    - Lower Extremity Ultrasound negative for DVT    - Outpatient sleep study        #Cholestatic Liver Enzymes -- Elevated Alk Phos and GGT. Unknown if patient  has Hx of cirrhosis although with normal AST/ALT, less likely, but would  prefer inpatient imaging to assess. Eyes appear slightly icteric (9/6) PM  without significant elevation of Bilirubin.    - RUQ Ultrasound (9/6) Impression noted Massive distention of the urinary  bladder with estimated volume of 1.5 L and Unremarkable appearance of the  liver    -- Foley Catheter for urinary retention        #Tremor -- likely 2/2 to medication as it was present on admission with normal  glucose levels. (9/3) AM patient was hypoglycemic to 36, resolution of  hypoglycemia improved baseline tremor    -- Carbamazepine Level (9/6) and (9/8) - WNL    -- Monitor blood glucose, insulin changes as detailed below    -- continue outpatient psychiatric medications        #Constipation    Pt has hx of constipation and reports constipation on day of admission for the  past several days     - Lactulose    - Senna BID    - cont home miralax and colace        #DM    On Metformin outpatient. Episode of hypoglycemia to 36 (9/3) AM. D/Ced Lantus  and 4u Lispro    - A1c 7.5    - SSI TIDWM    - hold home metformin        Chronic:    - Schizoaffective disorder: Continue home Carbamazepine 400 mg qam and 600 mg  qhs, Mirtazapine 15 mg qhs, Zyprexa 10 mg qhs, Seroquel 300 mg qhs    - HLD: Continue home atorvastatin        #Social: Pt lives in a group home        Full code (confirmed)    HH Diet    SQ heparin    Daughter: Donnie Aho Keo: [phone].    **Assessment and Plan**    Medication              -   **METOPROLOL (LOPRESSOR)** 25 MG Oral BID for 60 Days   Pending Date: 01/18/2020          -   **FUROSEMIDE (LASIX)** 40 MG Oral QDAY First Dose Now for 60 Days           -   **HYDRALAZINE (APRESOLINE)** 10 MG Oral TID for 57 Days, Clinician ZYS:AYTK SBP <90S. SEE ORDER #55.           -   **CEFEPIME (MAXIPIME)** 1 G Intravenous Q8H First Dose Now for 9 Doses, Clinician Dir:HAP/VAP/HCAP PROTOCOL. ATTACH TO 100 ML NS  MED PLUS. INFUSE FIRST DOSE OVER 30 MINUTES AND ALL SUBSEQUENT DOSES OVER 3 HOURS.           -   **LACTULOSE (CEPHULAC)** 20 G Oral BID for 60 Days           -   **IPRATROPIUM/ALBUTEROL SULFATE (DUONEB)** 3 ML Inhalation Q4H for 60 Days           -   **PREDNISONE (DELTASONE)** 40 MG Oral QDAY First Dose Now for 60 Days           -   **GUAIFENESIN SYRUP** 200 MG Oral Q4HPRN COUGH for 60 Days           -   **REMOVE PATCH** 1 PATCH Miscellaneous QDAY for 60 Days, Clinician Dir:OLD NICOTINE PATCH           -   **PATCH INTACT QSHIFT (PATCH INTACT INSPECTION)** 1 PATCH Topical CHECK for 60 Days, Clinician ZOX:[id number] (NICOTINE) PATCH EVERY SHIFT           -   **NICOTINE TRANSDERMAL (HABITROL TRANSDERMAL)** 21 MG Topical QDAY First Dose Now for 60 Days           -   **ASPIRIN CHEWABLE** 81 MG Oral QDAY for 60 Days           -   **CARBAMAZEPINE (TEGRETOL)** 400 MG Oral QAM for 60  Days           -   ** ** ** **INSULIN LISPRO (HUMALOG) (HUMALOG)******** Sliding Scale Subcutaneous TIDWM for 60 Days      For Glucose 80 to 150 Give 0 Units    For Glucose 151 to 200 Give 2 Units    For Glucose 201 to 250 Give 3 Units    For Glucose 251 to 300 Give 4 Units    Notify Physician if: Call HO for glucose > 300          -   **BUDESONIDE INHALATION (PULMICORT RESPULES)** 0.25 MG Inhalation BID for 60 Days, Clinician UJW:[id number] FOR TRELEGY SEE ORDER #31           -   **SENNA (SENOKOT)** 8.6 MG Oral BID for 60 Days           -   **HYDROCORTISONE 1% CREAM** 1 APP Topical BID for 60 Days, Clinician Dir:SEE EXTENDED INSTRUCTIONS INTERCHANGE FOR 2.5%           -   **ATORVASTATIN (LIPITOR)** 20 MG Oral QHS for 60 Days           -   **FORMOTEROL FUMARATE (PERFOROMIST)** 20 MCG Inhalation BID for 60 Days, Clinician OZH:[id number] FOR TRELEGY SEE ORDER #31           -   **CARBAMAZEPINE (TEGRETOL)** 600 MG Oral QHS for 60 Days           -   **BRIMONIDINE 0.2% OPHTH (ALPHAGAN 0.2% OPHTH)** 1 DROP Both Eyes SOL BID for 60 Days, Clinician XBM:[id number] FOR 0.15% STRENGTH           -   **QUETIAPINE FUMARATE (SEROQUEL)** 400 MG Oral QHS for 60 Days           -   **DOCUSATE SODIUM (COLACE)** 100 MG Oral BID for 60 Days           -   **OLANZAPINE (ZYPREXA)** 10 MG Oral QHS for 60 Days           -   **MIRTAZAPINE (REMERON)** 15 MG Oral QHS for  60 Days           -   **DORZOLAMIDE 2% OPHTH (TRUSOPT 2% OPHTH)** 1 DROP Both Eyes SOL BID for 60 Days           -   **DEXTROSE 50% SYRINGE (DEXTROSE 50%)** 12.5 G Intravenous PRN BG < 60 AND CAN NOT TAKE PO for 60 Days           -   **GLUCAGON** 1 MG Intramuscular PRN BG < 60 AND NO IV ACCESS for 60 Days, Clinician Dir:MAY BE GIVEN IM OR SUB-Q           -   **BISACODYL (DULCOLAX)** 10 MG Rectal QDAYPRN CONSTIPATION for 60 Days           -   **HEPARIN** 5000 UNITS Subcutaneous Q12H for 60 Days           -   **IPRATROPIUM/ALBUTEROL SULFATE  (DUONEB)** 3 ML Inhalation Q4HPRN SOB/WHEEZING for 60 Days           -   **BENZONATATE** 100 MG Oral TIDPRN COUGH for 60 Days           -   **AMMONIUM LACTATE 12% LOTION (LAC-HYDRIN 12%)** 1 APP Topical QDAYPRN RASH for 60 Days           -   **ALBUTEROL INHALER (VENTOLIN HFA)** 2 PUFF Inhalation Q4HPRN SHORTNESS OF BREATH for 60 Days           -   **ACETAMINOPHEN (TYLENOL)** 650 MG Oral Q6HPRN PAIN for 60 Days           -   **POLYETHYLENE GLYCOL (MIRALAX)** 17 G Oral QDAYPRN CONSTIPATION for 60 Days           Comment Arthur Young is a 54 yo male with history of HFrEF (EF 30%  05/2019), COPD not on home O2, HTN, DM, and, Schizoaffective disorder  presenting from a group home for shortness of breath.        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 History of previous admissions for CHF exacerbation requiring IV  Diuresis. Previous dry weight of 75kg with most recent weight 81kg in the ED.  Etiology of exacerbation unclear, but probable medication non-adherence given  poot historian. Spoke to patient with his daughter Donnie Aho) present on (9/9)  regarding CHF/COPD exacerbation and possible malignancy with subsequent steps  -- voiced understanding and that she would be the most appropriate HCP.    PRELOAD: Continued fluid overloaded on exam with lower extremity edema and CXR  notable for increased pulmonary vascular congestion with left pleural effusion    - Home 40mg  Lasix PO    --- Goal 0.5 - 1.0L negative as patient approaching euvolemia    - Strict I&O, Daily standing weights    -- CXR (9/6) without evidence of worsening pulmonary edema or pulmonary  vascular congestion    PUMP    Echo 09/02 with normal sized LV with normal LV thickness and severely reduced  LV systolic function (EF 20-25%), Flattened septum consistent with RV  pressure/volume overload, Severe global  hypokinesis of LV; Right Ventricle  moderate to severely dilated with severely reduced function, trace MR, severe  TR    - Metroprolol at low dose 25mg  bid    - Repeat inpatient TTE as closer to euvolemia  AFTERLOAD    - Hydralazine 10mg  TID    CORONARIES    - Continue home ASA and Atorvastatin 80mg     Neurohormonal: Previously discontinued Spironolactone for hyperkalemia in the  setting of K-sparing diuretics    -- Can consider Spironolactone for GDMT if hyperkalemia stable        #Acute COPD Exacerbation - PFTS (07/21/19) FEV1 24% with FEV1/FVC 40% GOLD IV.  Pulm previously wanted outpatient CT Chest, as patient is inpatient and being  actively diuresed with changes in mental status, will go ahead and pursue  inpatient CT Chest. SSA/SSB, RF per previous Pulm note Negative. Tmax 38.0,  BCx drawn (NGTD), and SBPs in 90s. Afebrile as of (9/9) without leukocytosis.  Pseudomonas on sputum Cx    - Cefepime 1g Q24H (9/8 - )    - s/p Vancomycin Dose by Level (9/7 - 9/9)    - s/p Ceftriaxone 1g Q24H (9/7-9/8)    - Sputum Cx - Pseudomonas    - Prednisone 40mg  qd 5 days (9/7 - ), (s/p 9/1-9/5)    ---- s/p Azithromycin 500mg  IV Q24H (9/2-3)    -- Duonebs Q4H    -- Pulmonary c/s -- recs appreciated    - continue prednisone 40 mg daily; decrease by 10 mg every 7 days until off    - we will follow CT abdomen; ongoing discussions regarding inpatient bronch  pending those results    - continue LABA/LAMA and ICS; PRN duonebs    - do not anticipate RHC for this patient given poor functional status and  poor candidacy for PHTN therapies    - definitive biopsy would likely be a VATS wedge -- no primary tumor on CT  A/P    - oxygen goal > 88%; wean oxygen as tolerated    - Replete KCl PO while diuresing -- replete Cl to support CO2 lowering  -- D/C Diamox    ---> Goal K 4.5-5.0    -- Repeat TTE when euvolemic    -- Cont. BiPAP and Duonebs        #AKI    Cr 1.85 on admission, baseline Cr possibly ~1.2-1.3. AKI probable  cardiorenal  in the setting of CHF exacerbation, 1.67 (9/5), will decrease magnitude of  diuresis as patient is approaching a euvolemic state. Creatinine (9/5) 1.83 ->  2.02 -> 1.99 (9/6) -> 2.55 (9/7) -> 1.92 (9/9). Etiology of AKI most likely  2/2 new urinary retention as found on RUQ Ultrasound (9/7), less likely  prerenal given continued diuresis, possibly massive diuresis of negative 6L  took time to be reflected in his kidney function. Patient cathed 2x overnight  with over 1.5L out. Placed foley catheter (9/8) to allow continuous post-  obstruction alleviation. CT A/P w/ PO Contrast Impression noted no definite  evidence of malignancy or infection is identified in the abdomen and pelvis.  Enlarged and heterogenous prostate. PSA WNL.    - Foley Catheter    - Monitor SCr    - Home 40mg  PO Lasix        #Pulmonay Nodules - CT Chest Impression (9/7) noted new innumerable bilateral  pulmonary nodules are concerning for metastasis. CT of the abdomen and pelvis  with IV contrast is recommended for further evaluation with a small focal  lucency in the manubrium of the sternum could represent a lytic metastasis.  Given possible metastatis, will look for primary cancer site    - Definitive biopsy would likely be a VATS wedge -- no primary  tumor on CT  A/P        #Altered Mental Status -- (9/6) morning patient was obtunded and responsive to  sternal rub. Sent patient for CT Head to rule out intracranial pathology.  Returned significantly improved mental status, but had some right upper  extremity rythmic jerking. Neuro was consulted and do not believe he was  having a focal seizure and without focal neurologic deficits. Possibly  secondary to medication, but unsure why it is worsening during his admission  as we've improved his fluid status. Improved towards baseline on (9/7) and  will continue BiPAP. NH3 79 on (9/7) with elevated Alk Phos, GGT, and LDH  without transaminitis or hyperbilirubinemia.    - Lactulose --  promote bowel movement and decrease serum NH3    - BiPAP as needed    - Neuro c/s -- recs appreciated    -- No acute interventions        #Anemia    #Polycythemia    # Iron Deficiency Anemia    Hgb 18.2 and Hct 59.9 possibly 2/2 COPD vs possible OSA. Can consider empiric  treatment of OSA with trial of CPAP if patient desaturates overnight. Iron  studies significant for Iron Deficiency anemia Iron 47, %Fe Saturation 12,  Ferritin 68, Transferrin 270. 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    - IV Venofer iso heart failure    - Consider outpatient workup (JAK2 testing and BM)    - Lower Extremity Ultrasound negative for DVT    - Outpatient sleep study        #Cholestatic Liver Enzymes -- Elevated Alk Phos and GGT. Unknown if patient  has Hx of cirrhosis although with normal AST/ALT, less likely, but would  prefer inpatient imaging to assess. Eyes appear slightly icteric (9/6) PM  without significant elevation of Bilirubin.    - RUQ Ultrasound (9/6) Impression noted Massive distention of the urinary  bladder with estimated volume of 1.5 L and Unremarkable appearance of the  liver    -- Foley Catheter for urinary retention        #Tremor -- likely 2/2 to medication as it was present on admission with normal  glucose levels. (9/3) AM patient was hypoglycemic to 36, resolution of  hypoglycemia improved baseline tremor    -- Carbamazepine Level (9/6) and (9/8) - WNL    -- Monitor blood glucose, insulin changes as detailed below    -- continue outpatient psychiatric medications        #Constipation    Pt has hx of constipation and reports constipation on day of admission for the  past several days    - Lactulose    - Senna BID    - cont home miralax and colace        #DM    On Metformin outpatient. Episode of hypoglycemia to 36 (9/3) AM. D/Ced Lantus  and 4u Lispro    - A1c 7.5    - SSI TIDWM    - hold home metformin        Chronic:    - Schizoaffective disorder: Continue  home Carbamazepine 400 mg qam and 600 mg  qhs, Mirtazapine 15 mg qhs, Zyprexa 10 mg qhs, Seroquel 300 mg qhs    - HLD: Continue home atorvastatin        #Social: Pt lives in a group home        Full code (confirmed)    HH Diet  SQ heparin    Daughter: Hulda Humphrey: [phone].            **Electronically signed by Laurelyn Sickle, MD on 01/18/2020 16:11**        **Electronically signed by Laurelyn Sickle, MD on 01/18/2020 16:11**        **Electronically signed by Laurelyn Sickle, MD on 01/18/2020 16:11**

## 2020-01-10 NOTE — Progress Notes (Signed)
 **Subjective**    Subjective Overnight:    - CO2 42 in PM, given dose of Diamox        This AM:    - Patient was very sleepy, but arousable when speaking to him this morning.  Went ahead and obtained an ABG, CXR, and placed patient in BiPAP for mental  status change    - Denies chest pain, nausea, vomiting, diarrhea, and headache.    **Subjective**    Subjective Overnight:    - CO2 42 in PM, given dose of Diamox        This AM:    - Patient was very sleepy, but arousable when speaking to him this morning.  Went ahead and obtained an ABG, CXR, and placed patient in BiPAP for mental  status change    - Denies chest pain, nausea, vomiting, diarrhea, and headache.    **ROS**    Complete Review of Systems All other systems reviewed and negative except as  noted in the HPI.    **ROS**    Complete Review of Systems All other systems reviewed and negative except as  noted in the HPI.    **Exam**    Comment Gen: +Obtunded, but arousable    HEENT: PERRL, unable to evaluate for JVD given body habitus    CV: RRR, no murmurs, rubs, gallops    Lung: +Diffuse Bilateral Crackles, improved from previous    Abd: Soft, Nontender, no rebound, no guarding +Distended, +BS    Ext: WWP +Bilateral lower extremity edema to the shins Right > Left, decreased  from prior but still 1+    Neuro: +intermittent tremor of upper/lower extremities +5/5 Extremity  strengths.      Vital Signs    01/15/2020 07:28              -  How Obtained: Bed Scale          -  Weight: 79.6kg      01/15/2020 05:02              -  Temperature: 36.6 (35-37.8Cel)          -  Site: Oral          -  Heart Rate: 99H (60-90)          -  Site: Monitor          -  BP: 112/76 (90-140/60-90)          -  Site: Left Arm          -  Position: Lying          -  Method: Automated          -  Respirations: 18 (12-20)          -  O2 Saturation (%): 96          -  O2 Amount: 2.5 LPM          -  O2 Delivery Method: Nasal Cannula          -  MEWS Vital Sign  Score: 1      01/15/2020 00:22              -  Morse Fall Risk Total: 125      01/14/2020 22:04              -  Fingerstick Glucose: 231      CFS Vital Signs CS    01/15/2020 06:59              -  Shift Intake Total: 0ml          -  Shift Output Total: ~12ml          -  Shift Balance: ~-135ml          I have reviewed and agree with vital signs as listed in the EMR Yes Time  01/15/2020 12:46.    **Exam**    Comment Gen: +Obtunded, but arousable    HEENT: PERRL, unable to evaluate for JVD given body habitus    CV: RRR, no murmurs, rubs, gallops    Lung: +Diffuse Bilateral Crackles, improved from previous    Abd: Soft, Nontender, no rebound, no guarding +Distended, +BS    Ext: WWP +Bilateral lower extremity edema to the shins Right > Left, decreased  from prior but still 1+    Neuro: +intermittent tremor of upper/lower extremities +5/5 Extremity  strengths.      Vital Signs    01/15/2020 07:28              -  How Obtained: Bed Scale          -  Weight: 79.6kg      01/15/2020 05:02              -  Temperature: 36.6 (35-37.8Cel)          -  Site: Oral          -  Heart Rate: 99H (60-90)          -  Site: Monitor          -  BP: 112/76 (90-140/60-90)          -  Site: Left Arm          -  Position: Lying          -  Method: Automated          -  Respirations: 18 (12-20)          -  O2 Saturation (%): 96          -  O2 Amount: 2.5 LPM          -  O2 Delivery Method: Nasal Cannula          -  MEWS Vital Sign Score: 1      01/15/2020 00:22              -  Morse Fall Risk Total: 125      01/14/2020 22:04              -  Fingerstick Glucose: 231      CFS Vital Signs CS    01/15/2020 06:59              -  Shift Intake Total: 0ml          -  Shift Output Total: ~16ml          -  Shift Balance: ~-147ml          I have reviewed and agree with vital signs as listed in the EMR Yes Time  01/15/2020 12:46.    **Labs**    All current labs have been reviewed by myself as of 01/15/2020 12:46.     **Labs**    All current labs have been reviewed by myself as of 01/15/2020 12:46.    **Radiology**    All current radiographs have been reviewed by myself as of 01/15/2020 12:46.    **Radiology**    All current radiographs  have been reviewed by myself as of 01/15/2020 12:46.    **Assessment and Plan**    Medication              -   **IRON SUCROSE COMPLEX (VENOFER)** 200 MG Intravenous QDAY for 2 Doses, Clinician Dir:ON DAYS 6 AND 7.   Pending Date: 01/16/2020          -   **HYDRALAZINE (APRESOLINE)** 50 MG Oral TID First Dose Now for 60 Days, Clinician Dir:SEE ORDER #55           -   **METOPROLOL (LOPRESSOR)** 12.5 MG Oral BID First Dose Now for 60 Days           -   **REMOVE PATCH** 1 PATCH Miscellaneous QDAY for 60 Days, Clinician Dir:OLD NICOTINE PATCH           -   **PATCH INTACT QSHIFT (PATCH INTACT INSPECTION)** 1 PATCH Topical CHECK for 60 Days, Clinician XNA:[id number] (NICOTINE) PATCH EVERY SHIFT           -   **NICOTINE TRANSDERMAL (HABITROL TRANSDERMAL)** 21 MG Topical QDAY First Dose Now for 60 Days           -   **ASPIRIN CHEWABLE** 81 MG Oral QDAY for 60 Days           -   **CARBAMAZEPINE (TEGRETOL)** 400 MG Oral QAM for 60 Days           -   ** ** ** **INSULIN LISPRO (HUMALOG) (HUMALOG)******** Sliding Scale Subcutaneous TIDWM for 60 Days      For Glucose 80 to 150 Give 0 Units    For Glucose 151 to 200 Give 2 Units    For Glucose 201 to 250 Give 3 Units    For Glucose 251 to 300 Give 4 Units    Notify Physician if: Call HO for glucose > 300          -   **IPRATROPIUM 0.02% INH (ATROVENT 0.02%)** 0.25 MG Inhalation Q6H for 60 Days, Clinician GUR:[id number] FOR TRELEGY           -   **BUDESONIDE INHALATION (PULMICORT RESPULES)** 0.25 MG Inhalation BID for 60 Days, Clinician BTD:[id number] FOR TRELEGY SEE ORDER #31           -   **CARBAMAZEPINE (TEGRETOL)** 600 MG Oral QHS for 60 Days           -   **ATORVASTATIN (LIPITOR)** 20 MG Oral QHS for 60 Days           -   **DORZOLAMIDE  2% OPHTH (TRUSOPT 2% OPHTH)** 1 DROP Both Eyes SOL BID for 60 Days           -   **MIRTAZAPINE (REMERON)** 15 MG Oral QHS for 60 Days           -   **FORMOTEROL FUMARATE (PERFOROMIST)** 20 MCG Inhalation BID for 60 Days, Clinician GYI:[id number] FOR TRELEGY SEE ORDER #31           -   **HYDROCORTISONE 1% CREAM** 1 APP Topical BID for 60 Days, Clinician Dir:SEE EXTENDED INSTRUCTIONS INTERCHANGE FOR 2.5%           -   **QUETIAPINE FUMARATE (SEROQUEL)** 400 MG Oral QHS for 60 Days           -   **DOCUSATE SODIUM (COLACE)** 100 MG Oral BID for 60 Days           -   **OLANZAPINE (ZYPREXA)** 10  MG Oral QHS for 60 Days           -   **SENNA (SENOKOT)** 8.6 MG Oral BID for 60 Days           -   **BRIMONIDINE 0.2% OPHTH (ALPHAGAN 0.2% OPHTH)** 1 DROP Both Eyes SOL BID for 60 Days, Clinician XLK:[id number] FOR 0.15% STRENGTH           -   **GLUCAGON** 1 MG Intramuscular PRN BG < 60 AND NO IV ACCESS for 60 Days, Clinician Dir:MAY BE GIVEN IM OR SUB-Q           -   **DEXTROSE 50% SYRINGE (DEXTROSE 50%)** 12.5 G Intravenous PRN BG < 60 AND CAN NOT TAKE PO for 60 Days           -   **BISACODYL (DULCOLAX)** 10 MG Rectal QDAYPRN CONSTIPATION for 60 Days           -   **HEPARIN** 5000 UNITS Subcutaneous Q12H for 60 Days           -   **IPRATROPIUM/ALBUTEROL SULFATE (DUONEB)** 3 ML Inhalation Q4HPRN SOB/WHEEZING for 60 Days           -   **BENZONATATE** 100 MG Oral TIDPRN COUGH for 60 Days           -   **ALBUTEROL INHALER (VENTOLIN HFA)** 2 PUFF Inhalation Q4HPRN SHORTNESS OF BREATH for 60 Days           -   **ACETAMINOPHEN (TYLENOL)** 650 MG Oral Q6HPRN PAIN for 60 Days           -   **POLYETHYLENE GLYCOL (MIRALAX)** 17 G Oral QDAYPRN CONSTIPATION for 60 Days           -   **AMMONIUM LACTATE 12% LOTION (LAC-HYDRIN 12%)** 1 APP Topical QDAYPRN RASH for 60 Days           Comment Arthur Young is a 54 yo male with history of HFrEF (EF 30%  05/2019), COPD not on home O2, HTN, DM, and,  Schizoaffective disorder  presenting from a group home for shortness of breath.        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 History of previous admissions for CHF exacerbation requiring IV  Diuresis. Previous dry weight of 75kg with most recent weight 81kg in the ED.  Etiology of exacerbation unclear, but probable medication non-adherence given  poot historian.    PRELOAD: Continued fluid overloaded on exam with lower extremity edema and CXR  notable for increased pulmonary vascular congestion with left pleural effusion    - Will Hold Lasix (9/6) given worsening Cr and approaching euvolemia    --- Goal 1-1.5 L negative    - Strict I&O, Daily standing weights    -- CXR (9/6) without evidence of worsening pulmonary edema or pulmonary  vascular congestion    PUMP    Echo 09/02 with normal sized LV with normal LV thickness and severely reduced  LV systolic function (EF 20-25%), Flattened septum consistent with RV  pressure/volume overload, Severe global hypokinesis of LV; Right Ventrible  moderate to severely dilated with severely reduced function, trace MR, severe  TR    - Metroprolol at low dose 12.5mg  bid    AFTERLOAD    - Hydralazine 50mg  TID  CORONARIES    - Continue home ASA and Atorvastatin 80mg     Neurohormonal: Previously discontinued Spironolactone for hyperkalemia in the  setting of K-sparing diuretics    -- Can consider Spironolactone for GDMT if hyperkalemia stable        #Acute COPD Exacerbation - PFTS (07/21/19) FEV1 24% with FEV1/FVC 40% GOLD IV.    - Prednisone 40mg  qd 5 days (9/1-9/5)    ---- s/p Azithromycin 500mg  IV Q24H (9/2-3)    - SSA/SSB, RF per previous Pulm note Negative    -- Duonebs Q4HPRN    -- Pulmonary c/s -- recs appreciated    -- Replete KCl PO -- replete Cl to support CO2 lowering -- D/C Diamox    -- Repeat TTE when euvolemic    -- Hold Diuresis  for 2-3 days until CO2 lowers    -- Cont. BiPAP and Duonebs        #AKI    Cr 1.85 on admission, baseline Cr possibly ~1.2-1.3. AKI probable cardiorenal  in the setting of CHF exacerbation, 1.67 (9/5), will decrease magnitude of  diuresis as patient is approaching a euvolemic state. Creatinine (9/5) 1.83 ->  2.02 -> 1.99 (9/6)    - Hold Diuresis        #Altered Mental Status -- (9/6) morning patient was obtunded and responsive to  sternal rub. Sent patient for CT Head to rule out intracranial pathology.  Returned significantly improved mental status, but had some right upper  extremity rythmic jerking. Neuro was consulted and do not believe he was  having a focal seizure and without focal neurologic deficits. Possibly  secondary to medication, but unsure why it is worsening during his admission  as we've improved his fluid status.    - BiPAP    - Neuro c/s -- recs appreciated        #Anemia    #Polycythemia    # Iron Deficiency Anemia    Hgb 18.2 and Hct 59.9 possibly 2/2 COPD vs possible OSA. Can consider empiric  treatment of OSA with trial of CPAP if patient desaturates overnight. Iron  studies significant for Iron Deficiency anemia Iron 47, %Fe Saturation 12,  Ferritin 68, Transferrin 270. 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    - IV Venofer iso heart failure    - Consider outpatient workup (JAK2 testing and BM)    - Lower Extremity Ultrasound negative for DVT    - outpatient sleep study        #Cholestatic Liver Enzymes -- Elevated Alk Phos and GGT. Unknown if patient  has Hx of cirrhosis although with normal AST/ALT, less likely, but would  prefer inpatient imaging to assess. Eyes appear slightly icteric (9/6) PM  without significant elevation of Bilirubin.    - RUQ Ultrasound        #Tremor -- likely 2/2 to medication as it was present on admission with normal  glucose levels. (9/3) AM patient was hypoglycemic to 36, resolution of  hypoglycemia improved  baseline tremor.    -- Monitor blood glucose, insulin changes as detailed below    -- continue outpatient psychiatric medications        #Constipation    Pt has hx of constipation and reports constipation on day of admission for the  past several days    - Senna BID    - cont home miralax and colace        #DM    On Metformin  outpatient. Episode of hypoglycemia to 36 (9/3) AM. D/Ced Lantus  and 4u Lispro    - A1c 7.5    - SSI TIDWM    - hold home metformin        Chronic:    - Schizoaffective disorder: Continue home Carbamazepine 400 mg qam and 600 mg  qhs, Mirtazapine 15 mg qhs, Zyprexa 10 mg qhs, Seroquel 300 mg qhs    - HLD: Continue home atorvastatin        #Social: Pt lives in a group home        Full code (confirmed)    HH Diet    SQ heparin.    **Assessment and Plan**    Medication              -   **IRON SUCROSE COMPLEX (VENOFER)** 200 MG Intravenous QDAY for 2 Doses, Clinician Dir:ON DAYS 6 AND 7.   Pending Date: 01/16/2020          -   **HYDRALAZINE (APRESOLINE)** 50 MG Oral TID First Dose Now for 60 Days, Clinician Dir:SEE ORDER #55           -   **METOPROLOL (LOPRESSOR)** 12.5 MG Oral BID First Dose Now for 60 Days           -   **REMOVE PATCH** 1 PATCH Miscellaneous QDAY for 60 Days, Clinician Dir:OLD NICOTINE PATCH           -   **PATCH INTACT QSHIFT (PATCH INTACT INSPECTION)** 1 PATCH Topical CHECK for 60 Days, Clinician UXL:[id number] (NICOTINE) PATCH EVERY SHIFT           -   **NICOTINE TRANSDERMAL (HABITROL TRANSDERMAL)** 21 MG Topical QDAY First Dose Now for 60 Days           -   **ASPIRIN CHEWABLE** 81 MG Oral QDAY for 60 Days           -   **CARBAMAZEPINE (TEGRETOL)** 400 MG Oral QAM for 60 Days           -   ** ** ** **INSULIN LISPRO (HUMALOG) (HUMALOG)******** Sliding Scale Subcutaneous TIDWM for 60 Days      For Glucose 80 to 150 Give 0 Units    For Glucose 151 to 200 Give 2 Units    For Glucose 201 to 250 Give 3 Units    For Glucose 251 to 300 Give 4 Units    Notify Physician  if: Call HO for glucose > 300          -   **IPRATROPIUM 0.02% INH (ATROVENT 0.02%)** 0.25 MG Inhalation Q6H for 60 Days, Clinician VOZ:[id number] FOR TRELEGY           -   **BUDESONIDE INHALATION (PULMICORT RESPULES)** 0.25 MG Inhalation BID for 60 Days, Clinician VZD:[id number] FOR TRELEGY SEE ORDER #31           -   **CARBAMAZEPINE (TEGRETOL)** 600 MG Oral QHS for 60 Days           -   **ATORVASTATIN (LIPITOR)** 20 MG Oral QHS for 60 Days           -   **DORZOLAMIDE 2% OPHTH (TRUSOPT 2% OPHTH)** 1 DROP Both Eyes SOL BID for 60 Days           -   **MIRTAZAPINE (REMERON)** 15 MG Oral QHS for 60 Days           -   **FORMOTEROL FUMARATE (PERFOROMIST)** 20 MCG Inhalation  BID for 60 Days, Clinician FAO:[id number] FOR TRELEGY SEE ORDER #31           -   **HYDROCORTISONE 1% CREAM** 1 APP Topical BID for 60 Days, Clinician Dir:SEE EXTENDED INSTRUCTIONS INTERCHANGE FOR 2.5%           -   **QUETIAPINE FUMARATE (SEROQUEL)** 400 MG Oral QHS for 60 Days           -   **DOCUSATE SODIUM (COLACE)** 100 MG Oral BID for 60 Days           -   **OLANZAPINE (ZYPREXA)** 10 MG Oral QHS for 60 Days           -   **SENNA (SENOKOT)** 8.6 MG Oral BID for 60 Days           -   **BRIMONIDINE 0.2% OPHTH (ALPHAGAN 0.2% OPHTH)** 1 DROP Both Eyes SOL BID for 60 Days, Clinician GEX:[id number] FOR 0.15% STRENGTH           -   **GLUCAGON** 1 MG Intramuscular PRN BG < 60 AND NO IV ACCESS for 60 Days, Clinician Dir:MAY BE GIVEN IM OR SUB-Q           -   **DEXTROSE 50% SYRINGE (DEXTROSE 50%)** 12.5 G Intravenous PRN BG < 60 AND CAN NOT TAKE PO for 60 Days           -   **BISACODYL (DULCOLAX)** 10 MG Rectal QDAYPRN CONSTIPATION for 60 Days           -   **HEPARIN** 5000 UNITS Subcutaneous Q12H for 60 Days           -   **IPRATROPIUM/ALBUTEROL SULFATE (DUONEB)** 3 ML Inhalation Q4HPRN SOB/WHEEZING for 60 Days           -   **BENZONATATE** 100 MG Oral TIDPRN COUGH for 60 Days           -   **ALBUTEROL INHALER  (VENTOLIN HFA)** 2 PUFF Inhalation Q4HPRN SHORTNESS OF BREATH for 60 Days           -   **ACETAMINOPHEN (TYLENOL)** 650 MG Oral Q6HPRN PAIN for 60 Days           -   **POLYETHYLENE GLYCOL (MIRALAX)** 17 G Oral QDAYPRN CONSTIPATION for 60 Days           -   **AMMONIUM LACTATE 12% LOTION (LAC-HYDRIN 12%)** 1 APP Topical QDAYPRN RASH for 60 Days           Comment Arthur Young is a 54 yo male with history of HFrEF (EF 30%  05/2019), COPD not on home O2, HTN, DM, and, Schizoaffective disorder  presenting from a group home for shortness of breath.        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 History of previous admissions for CHF exacerbation requiring IV  Diuresis. Previous dry weight of 75kg with most recent weight 81kg in the ED.  Etiology of exacerbation unclear, but probable medication non-adherence given  poot historian.    PRELOAD: Continued fluid overloaded on exam with lower extremity edema and CXR  notable for increased pulmonary vascular congestion with left pleural effusion    - Will Hold Lasix (9/6) given worsening Cr and approaching euvolemia    --- Goal 1-1.5 L negative    -  Strict I&O, Daily standing weights    -- CXR (9/6) without evidence of worsening pulmonary edema or pulmonary  vascular congestion    PUMP    Echo 09/02 with normal sized LV with normal LV thickness and severely reduced  LV systolic function (EF 20-25%), Flattened septum consistent with RV  pressure/volume overload, Severe global hypokinesis of LV; Right Ventrible  moderate to severely dilated with severely reduced function, trace MR, severe  TR    - Metroprolol at low dose 12.5mg  bid    AFTERLOAD    - Hydralazine 50mg  TID    CORONARIES    - Continue home ASA and Atorvastatin 80mg     Neurohormonal: Previously discontinued Spironolactone for hyperkalemia in the  setting of K-sparing diuretics     -- Can consider Spironolactone for GDMT if hyperkalemia stable        #Acute COPD Exacerbation - PFTS (07/21/19) FEV1 24% with FEV1/FVC 40% GOLD IV.    - Prednisone 40mg  qd 5 days (9/1-9/5)    ---- s/p Azithromycin 500mg  IV Q24H (9/2-3)    - SSA/SSB, RF per previous Pulm note Negative    -- Duonebs Q4HPRN    -- Pulmonary c/s -- recs appreciated    -- Replete KCl PO -- replete Cl to support CO2 lowering -- D/C Diamox    -- Repeat TTE when euvolemic    -- Hold Diuresis for 2-3 days until CO2 lowers    -- Cont. BiPAP and Duonebs        #AKI    Cr 1.85 on admission, baseline Cr possibly ~1.2-1.3. AKI probable cardiorenal  in the setting of CHF exacerbation, 1.67 (9/5), will decrease magnitude of  diuresis as patient is approaching a euvolemic state. Creatinine (9/5) 1.83 ->  2.02 -> 1.99 (9/6)    - Hold Diuresis        #Altered Mental Status -- (9/6) morning patient was obtunded and responsive to  sternal rub. Sent patient for CT Head to rule out intracranial pathology.  Returned significantly improved mental status, but had some right upper  extremity rythmic jerking. Neuro was consulted and do not believe he was  having a focal seizure and without focal neurologic deficits. Possibly  secondary to medication, but unsure why it is worsening during his admission  as we've improved his fluid status.    - BiPAP    - Neuro c/s -- recs appreciated        #Anemia    #Polycythemia    # Iron Deficiency Anemia    Hgb 18.2 and Hct 59.9 possibly 2/2 COPD vs possible OSA. Can consider empiric  treatment of OSA with trial of CPAP if patient desaturates overnight. Iron  studies significant for Iron Deficiency anemia Iron 47, %Fe Saturation 12,  Ferritin 68, Transferrin 270. 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    - IV Venofer iso heart failure    - Consider outpatient workup (JAK2 testing and BM)    - Lower Extremity Ultrasound negative for DVT    - outpatient sleep study         #Cholestatic Liver Enzymes -- Elevated Alk Phos and GGT. Unknown if patient  has Hx of cirrhosis although with normal AST/ALT, less likely, but would  prefer inpatient imaging to assess. Eyes appear slightly icteric (9/6) PM  without significant elevation of Bilirubin.    - RUQ Ultrasound        #Tremor -- likely 2/2 to medication as  it was present on admission with normal  glucose levels. (9/3) AM patient was hypoglycemic to 36, resolution of  hypoglycemia improved baseline tremor.    -- Monitor blood glucose, insulin changes as detailed below    -- continue outpatient psychiatric medications        #Constipation    Pt has hx of constipation and reports constipation on day of admission for the  past several days    - Senna BID    - cont home miralax and colace        #DM    On Metformin outpatient. Episode of hypoglycemia to 36 (9/3) AM. D/Ced Lantus  and 4u Lispro    - A1c 7.5    - SSI TIDWM    - hold home metformin        Chronic:    - Schizoaffective disorder: Continue home Carbamazepine 400 mg qam and 600 mg  qhs, Mirtazapine 15 mg qhs, Zyprexa 10 mg qhs, Seroquel 300 mg qhs    - HLD: Continue home atorvastatin        #Social: Pt lives in a group home        Full code (confirmed)    HH Diet    SQ heparin.            **Electronically signed by Laurelyn Sickle, MD on 01/15/2020 15:09**        **Electronically signed by Laurelyn Sickle, MD on 01/15/2020 15:09**

## 2020-01-10 NOTE — Progress Notes (Signed)
 **Subjective**    Subjective Improved since yesterday. Still having occasional twitching  tremors, but much less pronounced. He tends to perseverate on words and  motions during exam.    **Subjective**    Subjective Improved since yesterday. Still having occasional twitching  tremors, but much less pronounced. He tends to perseverate on words and  motions during exam.    **Exam**    Comment MS: A&Ox3 (self, city, month).    Language: Speech slowed, responds after some prompting, does not enunciate  well but able to complete thoughts. Comprehension is intact, able to follow  commands midline and appendicular. Tends to perseverate, will repeat commanded  movements after already compelted exam maneuver        CN: II: VFFC    III, IV, VI: EOMI    V: intact to LT throughout    VII: face symmetric    VIII: Hearing grossly intact    XI: SCM and Trapezius 5/5    XII: Tongue midline.        Motor:    No drift    Asterixis - beating tremor on extension of hands bilaterally    Bilateral legs have occasional twitching in quads.      Vital Signs    01/16/2020 12:14              -  Temperature: 36.9 (35-37.8Cel)          -  Site: Axillary          -  Heart Rate: 96H (60-90)          -  Site: Monitor          -  BP: 114/81 (90-140/60-90)          -  Site: Right Arm          -  Position: Lying          -  Method: Automated          -  Respirations: 18 (12-20)          -  O2 Saturation (%): 95          -  O2 Delivery Method: NIPPV-BIPAP          -  MEWS Vital Sign Score: 1      01/16/2020 10:37              -  Morse Fall Risk Total: 85      01/16/2020 09:09              -  How Obtained: Bed Scale          -  Weight: 76.4kg      01/15/2020 15:16              -  O2 Amount: 1.0 LPM      CFS Vital Signs CS    01/16/2020 22:59              -  Shift Intake Total: 0ml          -  Shift Output Total:          -  Shift Balance: -        **Exam**    Comment MS: A&Ox3 (self, city, month).     Language: Speech slowed, responds after some prompting, does not enunciate  well but able to complete thoughts. Comprehension is intact, able to follow  commands midline and appendicular. Tends to perseverate, will repeat commanded  movements after already compelted exam maneuver        CN: II: VFFC    III, IV, VI: EOMI    V: intact to LT throughout    VII: face symmetric    VIII: Hearing grossly intact    XI: SCM and Trapezius 5/5    XII: Tongue midline.        Motor:    No drift    Asterixis - beating tremor on extension of hands bilaterally    Bilateral legs have occasional twitching in quads.      Vital Signs    01/16/2020 12:14              -  Temperature: 36.9 (35-37.8Cel)          -  Site: Axillary          -  Heart Rate: 96H (60-90)          -  Site: Monitor          -  BP: 114/81 (90-140/60-90)          -  Site: Right Arm          -  Position: Lying          -  Method: Automated          -  Respirations: 18 (12-20)          -  O2 Saturation (%): 95          -  O2 Delivery Method: NIPPV-BIPAP          -  MEWS Vital Sign Score: 1      01/16/2020 10:37              -  Morse Fall Risk Total: 85      01/16/2020 09:09              -  How Obtained: Bed Scale          -  Weight: 76.4kg      01/15/2020 15:16              -  O2 Amount: 1.0 LPM      CFS Vital Signs CS    01/16/2020 22:59              -  Shift Intake Total: 0ml          -  Shift Output Total:          -  Shift Balance: -        **Assessment and Plan**    Medication              -   **LACTULOSE (CEPHULAC)** 20 G Oral BID for 60 Days   Pending Date: 01/16/2020          -   **IPRATROPIUM/ALBUTEROL SULFATE (DUONEB)** 3 ML Inhalation Q4H for 60 Days   Pending Date: 01/16/2020          -   **VANCOMYCIN 1G IN BAG** Intravenous ONCE for 1 Doses, Clinician Dir:ONE TIME.   Pending Date: 01/16/2020          -   **PREDNISONE (DELTASONE)** 40 MG Oral QDAY First Dose Now for 60 Days            -   **CEFTRIAXONE (ROCEPHIN)** 1 G Intravenous Q24H First Dose Now for 60 Days, Clinician VOJ:[id number] TO 100 ML NS MED PLUS. INFUSE OVER 30 MIN.           -   **  INSULIN LISPRO (HUMALOG) (HUMALOG)** 3 UNITS Subcutaneous ONE TIME for 1 Doses           -   **IPRATROPIUM/ALBUTEROL SULFATE (DUONEB)** 3 ML Inhalation Q6H for 60 Days           -   **GUAIFENESIN SYRUP** 200 MG Oral Q4HPRN COUGH for 60 Days           -   **IRON SUCROSE COMPLEX (VENOFER)** 200 MG Intravenous QDAY for 2 Doses, Clinician Dir:ON DAYS 6 AND 7.           -   **HYDRALAZINE (APRESOLINE)** 50 MG Oral TID First Dose Now for 60 Days, Clinician Dir:SEE ORDER #55           -   **METOPROLOL (LOPRESSOR)** 12.5 MG Oral BID First Dose Now for 60 Days           -   **REMOVE PATCH** 1 PATCH Miscellaneous QDAY for 60 Days, Clinician Dir:OLD NICOTINE PATCH           -   **PATCH INTACT QSHIFT (PATCH INTACT INSPECTION)** 1 PATCH Topical CHECK for 60 Days, Clinician OAC:[id number] (NICOTINE) PATCH EVERY SHIFT           -   **NICOTINE TRANSDERMAL (HABITROL TRANSDERMAL)** 21 MG Topical QDAY First Dose Now for 60 Days           -   **ASPIRIN CHEWABLE** 81 MG Oral QDAY for 60 Days           -   **CARBAMAZEPINE (TEGRETOL)** 400 MG Oral QAM for 60 Days           -   ** ** ** **INSULIN LISPRO (HUMALOG) (HUMALOG)******** Sliding Scale Subcutaneous TIDWM for 60 Days      For Glucose 80 to 150 Give 0 Units    For Glucose 151 to 200 Give 2 Units    For Glucose 201 to 250 Give 3 Units    For Glucose 251 to 300 Give 4 Units    Notify Physician if: Call HO for glucose > 300          -   **QUETIAPINE FUMARATE (SEROQUEL)** 400 MG Oral QHS for 60 Days           -   **DOCUSATE SODIUM (COLACE)** 100 MG Oral BID for 60 Days           -   **OLANZAPINE (ZYPREXA)** 10 MG Oral QHS for 60 Days           -   **CARBAMAZEPINE (TEGRETOL)** 600 MG Oral QHS for 60 Days           -   **ATORVASTATIN (LIPITOR)** 20 MG Oral QHS for 60 Days           -    **BRIMONIDINE 0.2% OPHTH (ALPHAGAN 0.2% OPHTH)** 1 DROP Both Eyes SOL BID for 60 Days, Clinician ZSW:[id number] FOR 0.15% STRENGTH           -   **BUDESONIDE INHALATION (PULMICORT RESPULES)** 0.25 MG Inhalation BID for 60 Days, Clinician KGU:[id number] FOR TRELEGY SEE ORDER #31           -   **FORMOTEROL FUMARATE (PERFOROMIST)** 20 MCG Inhalation BID for 60 Days, Clinician GBT:[id number] FOR TRELEGY SEE ORDER #31           -   **DORZOLAMIDE 2% OPHTH (TRUSOPT 2% OPHTH)** 1 DROP Both Eyes SOL BID for 60 Days           -   **  HYDROCORTISONE 1% CREAM** 1 APP Topical BID for 60 Days, Clinician Dir:SEE EXTENDED INSTRUCTIONS INTERCHANGE FOR 2.5%           -   **MIRTAZAPINE (REMERON)** 15 MG Oral QHS for 60 Days           -   **SENNA (SENOKOT)** 8.6 MG Oral BID for 60 Days           -   **GLUCAGON** 1 MG Intramuscular PRN BG < 60 AND NO IV ACCESS for 60 Days, Clinician Dir:MAY BE GIVEN IM OR SUB-Q           -   **DEXTROSE 50% SYRINGE (DEXTROSE 50%)** 12.5 G Intravenous PRN BG < 60 AND CAN NOT TAKE PO for 60 Days           -   **BISACODYL (DULCOLAX)** 10 MG Rectal QDAYPRN CONSTIPATION for 60 Days           -   **HEPARIN** 5000 UNITS Subcutaneous Q12H for 60 Days           -   **IPRATROPIUM/ALBUTEROL SULFATE (DUONEB)** 3 ML Inhalation Q4HPRN SOB/WHEEZING for 60 Days           -   **BENZONATATE** 100 MG Oral TIDPRN COUGH for 60 Days           -   **ALBUTEROL INHALER (VENTOLIN HFA)** 2 PUFF Inhalation Q4HPRN SHORTNESS OF BREATH for 60 Days           -   **ACETAMINOPHEN (TYLENOL)** 650 MG Oral Q6HPRN PAIN for 60 Days           -   **AMMONIUM LACTATE 12% LOTION (LAC-HYDRIN 12%)** 1 APP Topical QDAYPRN RASH for 60 Days           -   **POLYETHYLENE GLYCOL (MIRALAX)** 17 G Oral QDAYPRN CONSTIPATION for 60 Days           Neurology Assessment and Plan Comment Mr. Oboyle is a 75M with PMH of HFrEF  (EF 30% 05/2019), COPD not on home O2, HTN, DM, and, Schizoaffective disorder,  hospitalized for HF  exacerbation and has been receiving diuresis. Neurology  was consulted for rhythmic tremors/jerking in all the extremities which came  on today. His altered mental status quickly improved this morning and it is  thought that this is respiratory related, pulm is seeing the patient and  giving recs. His jerking movements are the more important neurologic question  currently. These have apparently been occuring per the patient, but it was  felt to be worsened today in the setting of his altered mental status this  morning and have been ongoing since then. He makes these movements while  sitting at rest as well as throughout actions.        Upon reexamination, patient had been placed on bipap and was sleeping. His  tremor was not present at all during sleep, except for some right wrist  flexion twitching as he was waking up. When awake, his tongue did not make  stereotypical tardive dyskinesia movements, and his tremor was not as  pronounced. He was able to tap repetitively when asked to but did not follow  the same rhythm as examiner. At baseline he seems to have a little trouble  following commands and requires some redirection, but is able to follow and is  oriented, alert, and otherwise neurologically intact.        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 This patient's  recent hypercapnia as evidence by his elevated HCO3 and pCO2 and AMS,  improvement with bipap could be consistent with this. He also has other  singnificant comorbidities that could contribute including CHF exacerbation.  This does not require neurologic intervention and should improve as his  medical status is optimized.        Recommendations:    - no acute interventions    - neurology will sign off        Discussed and seen with Dr. Pincus Sanes (attending neurologist)        Andree Elk, MD    Neurology [phone]    .    **Assessment and  Plan**    Medication              -   **LACTULOSE (CEPHULAC)** 20 G Oral BID for 60 Days   Pending Date: 01/16/2020          -   **IPRATROPIUM/ALBUTEROL SULFATE (DUONEB)** 3 ML Inhalation Q4H for 60 Days   Pending Date: 01/16/2020          -   **VANCOMYCIN 1G IN BAG** Intravenous ONCE for 1 Doses, Clinician Dir:ONE TIME.   Pending Date: 01/16/2020          -   **PREDNISONE (DELTASONE)** 40 MG Oral QDAY First Dose Now for 60 Days           -   **CEFTRIAXONE (ROCEPHIN)** 1 G Intravenous Q24H First Dose Now for 60 Days, Clinician UEA:[id number] TO 100 ML NS MED PLUS. INFUSE OVER 30 MIN.           -   **INSULIN LISPRO (HUMALOG) (HUMALOG)** 3 UNITS Subcutaneous ONE TIME for 1 Doses           -   **IPRATROPIUM/ALBUTEROL SULFATE (DUONEB)** 3 ML Inhalation Q6H for 60 Days           -   **GUAIFENESIN SYRUP** 200 MG Oral Q4HPRN COUGH for 60 Days           -   **IRON SUCROSE COMPLEX (VENOFER)** 200 MG Intravenous QDAY for 2 Doses, Clinician Dir:ON DAYS 6 AND 7.           -   **HYDRALAZINE (APRESOLINE)** 50 MG Oral TID First Dose Now for 60 Days, Clinician Dir:SEE ORDER #55           -   **METOPROLOL (LOPRESSOR)** 12.5 MG Oral BID First Dose Now for 60 Days           -   **REMOVE PATCH** 1 PATCH Miscellaneous QDAY for 60 Days, Clinician Dir:OLD NICOTINE PATCH           -   **PATCH INTACT QSHIFT (PATCH INTACT INSPECTION)** 1 PATCH Topical CHECK for 60 Days, Clinician XBJ:[id number] (NICOTINE) PATCH EVERY SHIFT           -   **NICOTINE TRANSDERMAL (HABITROL TRANSDERMAL)** 21 MG Topical QDAY First Dose Now for 60 Days           -   **ASPIRIN CHEWABLE** 81 MG Oral QDAY for 60 Days           -   **CARBAMAZEPINE (TEGRETOL)** 400 MG Oral QAM for 60 Days           -   ** ** ** **INSULIN LISPRO (HUMALOG) (HUMALOG)******** Sliding Scale Subcutaneous TIDWM for 60 Days      For Glucose 80 to 150 Give  0 Units    For Glucose 151 to 200 Give 2 Units    For Glucose 201 to 250 Give 3 Units    For Glucose  251 to 300 Give 4 Units    Notify Physician if: Call HO for glucose > 300          -   **QUETIAPINE FUMARATE (SEROQUEL)** 400 MG Oral QHS for 60 Days           -   **DOCUSATE SODIUM (COLACE)** 100 MG Oral BID for 60 Days           -   **OLANZAPINE (ZYPREXA)** 10 MG Oral QHS for 60 Days           -   **CARBAMAZEPINE (TEGRETOL)** 600 MG Oral QHS for 60 Days           -   **ATORVASTATIN (LIPITOR)** 20 MG Oral QHS for 60 Days           -   **BRIMONIDINE 0.2% OPHTH (ALPHAGAN 0.2% OPHTH)** 1 DROP Both Eyes SOL BID for 60 Days, Clinician MWN:[id number] FOR 0.15% STRENGTH           -   **BUDESONIDE INHALATION (PULMICORT RESPULES)** 0.25 MG Inhalation BID for 60 Days, Clinician HKV:[id number] FOR TRELEGY SEE ORDER #31           -   **FORMOTEROL FUMARATE (PERFOROMIST)** 20 MCG Inhalation BID for 60 Days, Clinician EPP:[id number] FOR TRELEGY SEE ORDER #31           -   **DORZOLAMIDE 2% OPHTH (TRUSOPT 2% OPHTH)** 1 DROP Both Eyes SOL BID for 60 Days           -   **HYDROCORTISONE 1% CREAM** 1 APP Topical BID for 60 Days, Clinician Dir:SEE EXTENDED INSTRUCTIONS INTERCHANGE FOR 2.5%           -   **MIRTAZAPINE (REMERON)** 15 MG Oral QHS for 60 Days           -   **SENNA (SENOKOT)** 8.6 MG Oral BID for 60 Days           -   **GLUCAGON** 1 MG Intramuscular PRN BG < 60 AND NO IV ACCESS for 60 Days, Clinician Dir:MAY BE GIVEN IM OR SUB-Q           -   **DEXTROSE 50% SYRINGE (DEXTROSE 50%)** 12.5 G Intravenous PRN BG < 60 AND CAN NOT TAKE PO for 60 Days           -   **BISACODYL (DULCOLAX)** 10 MG Rectal QDAYPRN CONSTIPATION for 60 Days           -   **HEPARIN** 5000 UNITS Subcutaneous Q12H for 60 Days           -   **IPRATROPIUM/ALBUTEROL SULFATE (DUONEB)** 3 ML Inhalation Q4HPRN SOB/WHEEZING for 60 Days           -   **BENZONATATE** 100 MG Oral TIDPRN COUGH for 60 Days           -   **ALBUTEROL INHALER (VENTOLIN HFA)** 2 PUFF Inhalation Q4HPRN SHORTNESS OF BREATH for 60 Days           -    **ACETAMINOPHEN (TYLENOL)** 650 MG Oral Q6HPRN PAIN for 60 Days           -   **AMMONIUM LACTATE 12% LOTION (LAC-HYDRIN 12%)** 1 APP Topical QDAYPRN RASH for 60 Days           -   **  POLYETHYLENE GLYCOL (MIRALAX)** 17 G Oral QDAYPRN CONSTIPATION for 60 Days           Neurology Assessment and Plan Comment Mr. Delage is a 59M with PMH of HFrEF  (EF 30% 05/2019), COPD not on home O2, HTN, DM, and, Schizoaffective disorder,  hospitalized for HF exacerbation and has been receiving diuresis. Neurology  was consulted for rhythmic tremors/jerking in all the extremities which came  on today. His altered mental status quickly improved this morning and it is  thought that this is respiratory related, pulm is seeing the patient and  giving recs. His jerking movements are the more important neurologic question  currently. These have apparently been occuring per the patient, but it was  felt to be worsened today in the setting of his altered mental status this  morning and have been ongoing since then. He makes these movements while  sitting at rest as well as throughout actions.        Upon reexamination, patient had been placed on bipap and was sleeping. His  tremor was not present at all during sleep, except for some right wrist  flexion twitching as he was waking up. When awake, his tongue did not make  stereotypical tardive dyskinesia movements, and his tremor was not as  pronounced. He was able to tap repetitively when asked to but did not follow  the same rhythm as examiner. At baseline he seems to have a little trouble  following commands and requires some redirection, but is able to follow and is  oriented, alert, and otherwise neurologically intact.        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 This patient's  recent hypercapnia as evidence by his elevated HCO3 and pCO2 and AMS,  improvement with bipap could be  consistent with this. He also has other  singnificant comorbidities that could contribute including CHF exacerbation.  This does not require neurologic intervention and should improve as his  medical status is optimized.        Recommendations:    - no acute interventions    - neurology will sign off        Discussed and seen with Dr. Pincus Sanes (attending neurologist)        Andree Elk, MD    Neurology [phone]    .            **Electronically signed by Judie Petit.Andree Elk, MD on 01/16/2020 16:58**        **Electronically signed by Judie Petit.Andree Elk, MD on 01/16/2020 16:58**

## 2020-01-10 NOTE — Progress Notes (Signed)
 Supervisory Note For Resident I performed a history and physical examination  of the patient and discussed the management with the resident. I reviewed the  resident's note and agree with the documented findings and plan of care,  except as documented below. Yes,    Additional Notes    Admitted ovenright    This morning, O2 better    Still bilaterally wheezing.    In examination, stll leg swelling, overloaded    duplex in the right leg    TTE today    Iron deficiency - gettig venofer    Continue diuresis.    Supervisory Note For Resident I performed a history and physical examination  of the patient and discussed the management with the resident. I reviewed the  resident's note and agree with the documented findings and plan of care,  except as documented below. Yes,    Additional Notes    Admitted ovenright    This morning, O2 better    Still bilaterally wheezing.    In examination, stll leg swelling, overloaded    duplex in the right leg    TTE today    Iron deficiency - gettig venofer    Continue diuresis.            **Electronically signed by Delora Fuel, MD on 01/11/2020 10:46**        **Electronically signed by Delora Fuel, MD on 01/11/2020 10:46**

## 2020-01-10 NOTE — Progress Notes (Signed)
 **Subjective**    Subjective Overnight:    - No Acute Events overnight        This AM:    - Patient appeared more at baseline this mornign when speaking to him. He was  about to start eating breakfast and endorsed feeling hungry    - Denies chest pain, nausea, vomiting, diarrhea, and headache.    Tolerating Diet Yes.    **Subjective**    Subjective Overnight:    - No Acute Events overnight        This AM:    - Patient appeared more at baseline this mornign when speaking to him. He was  about to start eating breakfast and endorsed feeling hungry    - Denies chest pain, nausea, vomiting, diarrhea, and headache.    Tolerating Diet Yes.    **ROS**    Complete Review of Systems All other systems reviewed and negative except as  noted in the HPI.    **ROS**    Complete Review of Systems All other systems reviewed and negative except as  noted in the HPI.    **Exam**    Comment Weight: 79.6 (9/6) -> 76.4 (9/7)    Gen: NAD, Lying in bed    HEENT: PERRL, unable to evaluate for JVD given body habitus +Mild Scleral  Icterus    CV: RRR, no murmurs, rubs, gallops    Lung: +Diffuse Bilateral Crackles with worsening wheezing    Abd: Soft, Nontender, no rebound, no guarding +Distended, +BS    Ext: WWP +Bilateral lower extremity edema to the shins Right > Left, decreased  from prior but trace edema present    Neuro: +intermittent tremor of upper/lower extremities +5/5 Extremity  strengths.      Vital Signs    01/16/2020 12:14              -  Temperature: 36.9 (35-37.8Cel)          -  Site: Axillary          -  Heart Rate: 96H (60-90)          -  Site: Monitor          -  BP: 114/81 (90-140/60-90)          -  Site: Right Arm          -  Position: Lying          -  Method: Automated          -  Respirations: 18 (12-20)          -  O2 Saturation (%): 95          -  O2 Delivery Method: NIPPV-BIPAP          -  MEWS Vital Sign Score: 1      01/16/2020 10:37              -  Morse Fall Risk Total: 85       01/16/2020 09:09              -  How Obtained: Bed Scale          -  Weight: 76.4kg      01/15/2020 15:16              -  O2 Amount: 1.0 LPM      CFS Vital Signs CS    01/16/2020 14:59              -  Shift Intake Total:          -  Shift Output Total:          -  Shift Balance: 37ml        **Exam**    Comment Weight: 79.6 (9/6) -> 76.4 (9/7)    Gen: NAD, Lying in bed    HEENT: PERRL, unable to evaluate for JVD given body habitus +Mild Scleral  Icterus    CV: RRR, no murmurs, rubs, gallops    Lung: +Diffuse Bilateral Crackles with worsening wheezing    Abd: Soft, Nontender, no rebound, no guarding +Distended, +BS    Ext: WWP +Bilateral lower extremity edema to the shins Right > Left, decreased  from prior but trace edema present    Neuro: +intermittent tremor of upper/lower extremities +5/5 Extremity  strengths.      Vital Signs    01/16/2020 12:14              -  Temperature: 36.9 (35-37.8Cel)          -  Site: Axillary          -  Heart Rate: 96H (60-90)          -  Site: Monitor          -  BP: 114/81 (90-140/60-90)          -  Site: Right Arm          -  Position: Lying          -  Method: Automated          -  Respirations: 18 (12-20)          -  O2 Saturation (%): 95          -  O2 Delivery Method: NIPPV-BIPAP          -  MEWS Vital Sign Score: 1      01/16/2020 10:37              -  Morse Fall Risk Total: 85      01/16/2020 09:09              -  How Obtained: Bed Scale          -  Weight: 76.4kg      01/15/2020 15:16              -  O2 Amount: 1.0 LPM      CFS Vital Signs CS    01/16/2020 14:59              -  Shift Intake Total:          -  Shift Output Total:          -  Shift Balance: 37ml        **Labs**    All current labs have been reviewed by myself as of 01/16/2020 14:07.    **Labs**    All current labs have been reviewed by myself as of 01/16/2020 14:07.    **Radiology**    All current radiographs have been reviewed by myself  as of 01/16/2020 14:07.    **Radiology**    All current radiographs have been reviewed by myself as of 01/16/2020 14:07.    **Assessment and Plan**    Medication              -   **INSULIN LISPRO (HUMALOG) (HUMALOG)** 3 UNITS Subcutaneous ONE TIME for 1  Doses           -   **IPRATROPIUM/ALBUTEROL SULFATE (DUONEB)** 3 ML Inhalation Q6H for 60 Days           -   **GUAIFENESIN SYRUP** 200 MG Oral Q4HPRN COUGH for 60 Days           -   **LACTULOSE (CEPHULAC)** 20 G Oral ONE TIME for 1 Doses           -   **IRON SUCROSE COMPLEX (VENOFER)** 200 MG Intravenous QDAY for 2 Doses, Clinician Dir:ON DAYS 6 AND 7.           -   **HYDRALAZINE (APRESOLINE)** 50 MG Oral TID First Dose Now for 60 Days, Clinician Dir:SEE ORDER #[id number]           -   **METOPROLOL (LOPRESSOR)** 12.5 MG Oral BID First Dose Now for 60 Days           -   **REMOVE PATCH** 1 PATCH Miscellaneous QDAY for 60 Days, Clinician Dir:OLD NICOTINE PATCH           -   **PATCH INTACT QSHIFT (PATCH INTACT INSPECTION)** 1 PATCH Topical CHECK for 60 Days, Clinician ZOX:[id number] (NICOTINE) PATCH EVERY SHIFT           -   **NICOTINE TRANSDERMAL (HABITROL TRANSDERMAL)** 21 MG Topical QDAY First Dose Now for 60 Days           -   **ASPIRIN CHEWABLE** 81 MG Oral QDAY for 60 Days           -   **CARBAMAZEPINE (TEGRETOL)** 400 MG Oral QAM for 60 Days           -   ** ** ** **INSULIN LISPRO (HUMALOG) (HUMALOG)******** Sliding Scale Subcutaneous TIDWM for 60 Days      For Glucose 80 to 150 Give 0 Units    For Glucose 151 to 200 Give 2 Units    For Glucose 201 to 250 Give 3 Units    For Glucose 251 to 300 Give 4 Units    Notify Physician if: Call HO for glucose > 300          -   **DORZOLAMIDE 2% OPHTH (TRUSOPT 2% OPHTH)** 1 DROP Both Eyes SOL BID for 60 Days           -   **BUDESONIDE INHALATION (PULMICORT RESPULES)** 0.25 MG Inhalation BID for 60 Days, Clinician UJW:[id number] FOR TRELEGY SEE ORDER #31           -   **MIRTAZAPINE (REMERON)** 15 MG Oral QHS  for 60 Days           -   **CARBAMAZEPINE (TEGRETOL)** 600 MG Oral QHS for 60 Days           -   **HYDROCORTISONE 1% CREAM** 1 APP Topical BID for 60 Days, Clinician Dir:SEE EXTENDED INSTRUCTIONS INTERCHANGE FOR 2.5%           -   **ATORVASTATIN (LIPITOR)** 20 MG Oral QHS for 60 Days           -   **SENNA (SENOKOT)** 8.6 MG Oral BID for 60 Days           -   **BRIMONIDINE 0.2% OPHTH (ALPHAGAN 0.2% OPHTH)** 1 DROP Both Eyes SOL BID for 60 Days, Clinician OZH:[id number] FOR 0.15% STRENGTH           -   **OLANZAPINE (ZYPREXA)** 10 MG Oral QHS for 60 Days           -   **  FORMOTEROL FUMARATE (PERFOROMIST)** 20 MCG Inhalation BID for 60 Days, Clinician ZOX:[id number] FOR TRELEGY SEE ORDER #31           -   **DOCUSATE SODIUM (COLACE)** 100 MG Oral BID for 60 Days           -   **QUETIAPINE FUMARATE (SEROQUEL)** 400 MG Oral QHS for 60 Days           -   **GLUCAGON** 1 MG Intramuscular PRN BG < 60 AND NO IV ACCESS for 60 Days, Clinician Dir:MAY BE GIVEN IM OR SUB-Q           -   **DEXTROSE 50% SYRINGE (DEXTROSE 50%)** 12.5 G Intravenous PRN BG < 60 AND CAN NOT TAKE PO for 60 Days           -   **BISACODYL (DULCOLAX)** 10 MG Rectal QDAYPRN CONSTIPATION for 60 Days           -   **HEPARIN** 5000 UNITS Subcutaneous Q12H for 60 Days           -   **IPRATROPIUM/ALBUTEROL SULFATE (DUONEB)** 3 ML Inhalation Q4HPRN SOB/WHEEZING for 60 Days           -   **ALBUTEROL INHALER (VENTOLIN HFA)** 2 PUFF Inhalation Q4HPRN SHORTNESS OF BREATH for 60 Days           -   **ACETAMINOPHEN (TYLENOL)** 650 MG Oral Q6HPRN PAIN for 60 Days           -   **POLYETHYLENE GLYCOL (MIRALAX)** 17 G Oral QDAYPRN CONSTIPATION for 60 Days           -   **AMMONIUM LACTATE 12% LOTION (LAC-HYDRIN 12%)** 1 APP Topical QDAYPRN RASH for 60 Days           -   **BENZONATATE** 100 MG Oral TIDPRN COUGH for 60 Days           Comment Mr. Nilo Fallin is a 54 yo male with history of HFrEF (EF 30%  05/2019), COPD not on home O2, HTN,  DM, and, Schizoaffective disorder  presenting from a group home for shortness of breath.        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 History of previous admissions for CHF exacerbation requiring IV  Diuresis. Previous dry weight of 75kg with most recent weight 81kg in the ED.  Etiology of exacerbation unclear, but probable medication non-adherence given  poot historian.    PRELOAD: Continued fluid overloaded on exam with lower extremity edema and CXR  notable for increased pulmonary vascular congestion with left pleural effusion    - Home 40mg  Lasix PO -- Net +1145 after diuretic holiday (9/6)    --- Goal 0.5-1.0 L negative as patient approaching euvolemia    - Strict I&O, Daily standing weights    -- CXR (9/6) without evidence of worsening pulmonary edema or pulmonary  vascular congestion    PUMP    Echo 09/02 with normal sized LV with normal LV thickness and severely reduced  LV systolic function (EF 20-25%), Flattened septum consistent with RV  pressure/volume overload, Severe global hypokinesis of LV; Right Ventrible  moderate to severely dilated with severely reduced function, trace MR, severe  TR    - Metroprolol at low dose 12.5mg  bid    - Repeat inpatient TTE  as closer to euvolemia    AFTERLOAD    - Hydralazine 50mg  TID    CORONARIES    - Continue home ASA and Atorvastatin 80mg     Neurohormonal: Previously discontinued Spironolactone for hyperkalemia in the  setting of K-sparing diuretics    -- Can consider Spironolactone for GDMT if hyperkalemia stable        #Acute COPD Exacerbation - PFTS (07/21/19) FEV1 24% with FEV1/FVC 40% GOLD IV.  Pulm previously wanted outpatient CT Chest, as patient is inpatient and being  actively diuresed with changes in mental status, will go ahead and pursue  inpatient CT Chest.    - CT Chest -- Low threshold to start Ceftriaxone/Vancomycin in  setting of  worsened cough - Afebrile without leukocytosis    - Sputum Cx    - Prednisone 40mg  qd 5 days (9/7 - ), (s/p 9/1-9/5)    ---- s/p Azithromycin 500mg  IV Q24H (9/2-3)    - SSA/SSB, RF per previous Pulm note Negative    -- Duonebs Q4H    -- Pulmonary c/s -- recs appreciated    -- Replete KCl PO -- replete Cl to support CO2 lowering -- D/C Diamox    ---> Goal K 4.5-5.0    -- Repeat TTE when euvolemic    -- Hold Diuresis for 2-3 days until CO2 lowers    -- Cont. BiPAP and Duonebs        #AKI    Cr 1.85 on admission, baseline Cr possibly ~1.2-1.3. AKI probable cardiorenal  in the setting of CHF exacerbation, 1.67 (9/5), will decrease magnitude of  diuresis as patient is approaching a euvolemic state. Creatinine (9/5) 1.83 ->  2.02 -> 1.99 (9/6) -> 2.55 (9/7). Etiology of AKI most likely 2/2 prerenal  given continued diuresis, possibly massive diuresis of negative 6L took time  to be reflected in his kidney function vs. new urinary retention as found on  RUQ Ultrasound (9/7)    - Urine Electrolytes    - Monitor SCr    - Hold Diuresis        #Altered Mental Status -- (9/6) morning patient was obtunded and responsive to  sternal rub. Sent patient for CT Head to rule out intracranial pathology.  Returned significantly improved mental status, but had some right upper  extremity rythmic jerking. Neuro was consulted and do not believe he was  having a focal seizure and without focal neurologic deficits. Possibly  secondary to medication, but unsure why it is worsening during his admission  as we've improved his fluid status. Improved towards baseline on (9/7) and  will continue BiPAP. NH3 79 on (9/7) with elevated Alk Phos, GGT, and LDH  without transaminitis or hyperbilirubinemia.    - Lactulose -- promote bowel movement and decrease serum NH3    - BiPAP as needed    - Neuro c/s -- recs appreciated    -- No acute interventions        #Anemia    #Polycythemia    # Iron Deficiency Anemia    Hgb 18.2 and Hct 59.9  possibly 2/2 COPD vs possible OSA. Can consider empiric  treatment of OSA with trial of CPAP if patient desaturates overnight. Iron  studies significant for Iron Deficiency anemia Iron 47, %Fe Saturation 12,  Ferritin 68, Transferrin 270. 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    - IV Venofer iso heart failure    - Consider outpatient workup (JAK2 testing and BM)    -  Lower Extremity Ultrasound negative for DVT    - outpatient sleep study        #Cholestatic Liver Enzymes -- Elevated Alk Phos and GGT. Unknown if patient  has Hx of cirrhosis although with normal AST/ALT, less likely, but would  prefer inpatient imaging to assess. Eyes appear slightly icteric (9/6) PM  without significant elevation of Bilirubin.    - RUQ Ultrasound (9/6) Impression noted Massive distention of the urinary  bladder with estimated volume of 1.5 L and Unremarkable appearance of the  liver    -- Foley Catheter for urinary retention        #Tremor -- likely 2/2 to medication as it was present on admission with normal  glucose levels. (9/3) AM patient was hypoglycemic to 36, resolution of  hypoglycemia improved baseline tremor.    -- Monitor blood glucose, insulin changes as detailed below    -- continue outpatient psychiatric medications        #Constipation    Pt has hx of constipation and reports constipation on day of admission for the  past several days    - Lactulose    - Senna BID    - cont home miralax and colace        #DM    On Metformin outpatient. Episode of hypoglycemia to 36 (9/3) AM. D/Ced Lantus  and 4u Lispro    - A1c 7.5    - SSI TIDWM    - hold home metformin        Chronic:    - Schizoaffective disorder: Continue home Carbamazepine 400 mg qam and 600 mg  qhs, Mirtazapine 15 mg qhs, Zyprexa 10 mg qhs, Seroquel 300 mg qhs    - HLD: Continue home atorvastatin        #Social: Pt lives in a group home        Full code (confirmed)    HH Diet    SQ heparin.    **Assessment and  Plan**    Medication              -   **INSULIN LISPRO (HUMALOG) (HUMALOG)** 3 UNITS Subcutaneous ONE TIME for 1 Doses           -   **IPRATROPIUM/ALBUTEROL SULFATE (DUONEB)** 3 ML Inhalation Q6H for 60 Days           -   **GUAIFENESIN SYRUP** 200 MG Oral Q4HPRN COUGH for 60 Days           -   **LACTULOSE (CEPHULAC)** 20 G Oral ONE TIME for 1 Doses           -   **IRON SUCROSE COMPLEX (VENOFER)** 200 MG Intravenous QDAY for 2 Doses, Clinician Dir:ON DAYS 6 AND 7.           -   **HYDRALAZINE (APRESOLINE)** 50 MG Oral TID First Dose Now for 60 Days, Clinician Dir:SEE ORDER #[id number]           -   **METOPROLOL (LOPRESSOR)** 12.5 MG Oral BID First Dose Now for 60 Days           -   **REMOVE PATCH** 1 PATCH Miscellaneous QDAY for 60 Days, Clinician Dir:OLD NICOTINE PATCH           -   **PATCH INTACT QSHIFT (PATCH INTACT INSPECTION)** 1 PATCH Topical CHECK for 60 Days, Clinician UXN:[id number] (NICOTINE) PATCH EVERY SHIFT           -   **NICOTINE TRANSDERMAL (HABITROL TRANSDERMAL)**  21 MG Topical QDAY First Dose Now for 60 Days           -   **ASPIRIN CHEWABLE** 81 MG Oral QDAY for 60 Days           -   **CARBAMAZEPINE (TEGRETOL)** 400 MG Oral QAM for 60 Days           -   ** ** ** **INSULIN LISPRO (HUMALOG) (HUMALOG)******** Sliding Scale Subcutaneous TIDWM for 60 Days      For Glucose 80 to 150 Give 0 Units    For Glucose 151 to 200 Give 2 Units    For Glucose 201 to 250 Give 3 Units    For Glucose 251 to 300 Give 4 Units    Notify Physician if: Call HO for glucose > 300          -   **DORZOLAMIDE 2% OPHTH (TRUSOPT 2% OPHTH)** 1 DROP Both Eyes SOL BID for 60 Days           -   **BUDESONIDE INHALATION (PULMICORT RESPULES)** 0.25 MG Inhalation BID for 60 Days, Clinician BMW:[id number] FOR TRELEGY SEE ORDER #31           -   **MIRTAZAPINE (REMERON)** 15 MG Oral QHS for 60 Days           -   **CARBAMAZEPINE (TEGRETOL)** 600 MG Oral QHS for 60 Days           -   **HYDROCORTISONE 1% CREAM** 1 APP  Topical BID for 60 Days, Clinician Dir:SEE EXTENDED INSTRUCTIONS INTERCHANGE FOR 2.5%           -   **ATORVASTATIN (LIPITOR)** 20 MG Oral QHS for 60 Days           -   **SENNA (SENOKOT)** 8.6 MG Oral BID for 60 Days           -   **BRIMONIDINE 0.2% OPHTH (ALPHAGAN 0.2% OPHTH)** 1 DROP Both Eyes SOL BID for 60 Days, Clinician OZD:[id number] FOR 0.15% STRENGTH           -   **OLANZAPINE (ZYPREXA)** 10 MG Oral QHS for 60 Days           -   **FORMOTEROL FUMARATE (PERFOROMIST)** 20 MCG Inhalation BID for 60 Days, Clinician ZDG:[id number] FOR TRELEGY SEE ORDER #31           -   **DOCUSATE SODIUM (COLACE)** 100 MG Oral BID for 60 Days           -   **QUETIAPINE FUMARATE (SEROQUEL)** 400 MG Oral QHS for 60 Days           -   **GLUCAGON** 1 MG Intramuscular PRN BG < 60 AND NO IV ACCESS for 60 Days, Clinician Dir:MAY BE GIVEN IM OR SUB-Q           -   **DEXTROSE 50% SYRINGE (DEXTROSE 50%)** 12.5 G Intravenous PRN BG < 60 AND CAN NOT TAKE PO for 60 Days           -   **BISACODYL (DULCOLAX)** 10 MG Rectal QDAYPRN CONSTIPATION for 60 Days           -   **HEPARIN** 5000 UNITS Subcutaneous Q12H for 60 Days           -   **IPRATROPIUM/ALBUTEROL SULFATE (DUONEB)** 3 ML Inhalation Q4HPRN SOB/WHEEZING for 60 Days           -   **ALBUTEROL INHALER (VENTOLIN HFA)**  2 PUFF Inhalation Q4HPRN SHORTNESS OF BREATH for 60 Days           -   **ACETAMINOPHEN (TYLENOL)** 650 MG Oral Q6HPRN PAIN for 60 Days           -   **POLYETHYLENE GLYCOL (MIRALAX)** 17 G Oral QDAYPRN CONSTIPATION for 60 Days           -   **AMMONIUM LACTATE 12% LOTION (LAC-HYDRIN 12%)** 1 APP Topical QDAYPRN RASH for 60 Days           -   **BENZONATATE** 100 MG Oral TIDPRN COUGH for 60 Days           Comment [name] is a 54 yo male with history of HFrEF (EF 30%  05/2019), COPD not on home O2, HTN, DM, and, Schizoaffective disorder  presenting from a group home for shortness of breath.        # Dyspnea on Exertion    # Biventricular  HFrEF (LVEF 30%)    # Acute CHF Exacerbation    Patient endorsing 2 weeks of DOE, lower extremity swelling, and productive  cough. History of previous admissions for CHF exacerbation requiring IV  Diuresis. Previous dry weight of 75kg with most recent weight 81kg in the ED.  Etiology of exacerbation unclear, but probable medication non-adherence given  poot historian.    PRELOAD: Continued fluid overloaded on exam with lower extremity edema and CXR  notable for increased pulmonary vascular congestion with left pleural effusion    - Home 40mg  Lasix PO -- Net +1145 after diuretic holiday (9/6)    --- Goal 0.5-1.0 L negative as patient approaching euvolemia    - Strict I&O, Daily standing weights    -- CXR (9/6) without evidence of worsening pulmonary edema or pulmonary  vascular congestion    PUMP    Echo 09/02 with normal sized LV with normal LV thickness and severely reduced  LV systolic function (EF 20-25%), Flattened septum consistent with RV  pressure/volume overload, Severe global hypokinesis of LV; Right Ventrible  moderate to severely dilated with severely reduced function, trace MR, severe  TR    - Metroprolol at low dose 12.5mg  bid    - Repeat inpatient TTE as closer to euvolemia    AFTERLOAD    - Hydralazine 50mg  TID    CORONARIES    - Continue home ASA and Atorvastatin 80mg     Neurohormonal: Previously discontinued Spironolactone for hyperkalemia in the  setting of K-sparing diuretics    -- Can consider Spironolactone for GDMT if hyperkalemia stable        #Acute COPD Exacerbation - PFTS (07/21/19) FEV1 24% with FEV1/FVC 40% GOLD IV.  Pulm previously wanted outpatient CT Chest, as patient is inpatient and being  actively diuresed with changes in mental status, will go ahead and pursue  inpatient CT Chest.    - CT Chest -- Low threshold to start Ceftriaxone/Vancomycin in setting of  worsened cough - Afebrile without leukocytosis    - Sputum Cx    - Prednisone 40mg  qd 5 days (9/7 - ), (s/p 9/1-9/5)    ----  s/p Azithromycin 500mg  IV Q24H (9/2-3)    - SSA/SSB, RF per previous Pulm note Negative    -- Duonebs Q4H    -- Pulmonary c/s -- recs appreciated    -- Replete KCl PO -- replete Cl to support CO2 lowering -- D/C Diamox    ---> Goal K 4.5-5.0    -- Repeat  TTE when euvolemic    -- Hold Diuresis for 2-3 days until CO2 lowers    -- Cont. BiPAP and Duonebs        #AKI    Cr 1.85 on admission, baseline Cr possibly ~1.2-1.3. AKI probable cardiorenal  in the setting of CHF exacerbation, 1.67 (9/5), will decrease magnitude of  diuresis as patient is approaching a euvolemic state. Creatinine (9/5) 1.83 ->  2.02 -> 1.99 (9/6) -> 2.55 (9/7). Etiology of AKI most likely 2/2 prerenal  given continued diuresis, possibly massive diuresis of negative 6L took time  to be reflected in his kidney function vs. new urinary retention as found on  RUQ Ultrasound (9/7)    - Urine Electrolytes    - Monitor SCr    - Hold Diuresis        #Altered Mental Status -- (9/6) morning patient was obtunded and responsive to  sternal rub. Sent patient for CT Head to rule out intracranial pathology.  Returned significantly improved mental status, but had some right upper  extremity rythmic jerking. Neuro was consulted and do not believe he was  having a focal seizure and without focal neurologic deficits. Possibly  secondary to medication, but unsure why it is worsening during his admission  as we've improved his fluid status. Improved towards baseline on (9/7) and  will continue BiPAP. NH3 79 on (9/7) with elevated Alk Phos, GGT, and LDH  without transaminitis or hyperbilirubinemia.    - Lactulose -- promote bowel movement and decrease serum NH3    - BiPAP as needed    - Neuro c/s -- recs appreciated    -- No acute interventions        #Anemia    #Polycythemia    # Iron Deficiency Anemia    Hgb 18.2 and Hct 59.9 possibly 2/2 COPD vs possible OSA. Can consider empiric  treatment of OSA with trial of CPAP if patient desaturates overnight.  Iron  studies significant for Iron Deficiency anemia Iron 47, %Fe Saturation 12,  Ferritin 68, Transferrin 270. 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    - IV Venofer iso heart failure    - Consider outpatient workup (JAK2 testing and BM)    - Lower Extremity Ultrasound negative for DVT    - outpatient sleep study        #Cholestatic Liver Enzymes -- Elevated Alk Phos and GGT. Unknown if patient  has Hx of cirrhosis although with normal AST/ALT, less likely, but would  prefer inpatient imaging to assess. Eyes appear slightly icteric (9/6) PM  without significant elevation of Bilirubin.    - RUQ Ultrasound (9/6) Impression noted Massive distention of the urinary  bladder with estimated volume of 1.5 L and Unremarkable appearance of the  liver    -- Foley Catheter for urinary retention        #Tremor -- likely 2/2 to medication as it was present on admission with normal  glucose levels. (9/3) AM patient was hypoglycemic to 36, resolution of  hypoglycemia improved baseline tremor.    -- Monitor blood glucose, insulin changes as detailed below    -- continue outpatient psychiatric medications        #Constipation    Pt has hx of constipation and reports constipation on day of admission for the  past several days    - Lactulose    - Senna BID    - cont home miralax and colace        #  DM    On Metformin outpatient. Episode of hypoglycemia to 36 (9/3) AM. D/Ced Lantus  and 4u Lispro    - A1c 7.5    - SSI TIDWM    - hold home metformin        Chronic:    - Schizoaffective disorder: Continue home Carbamazepine 400 mg qam and 600 mg  qhs, Mirtazapine 15 mg qhs, Zyprexa 10 mg qhs, Seroquel 300 mg qhs    - HLD: Continue home atorvastatin        #Social: Pt lives in a group home        Full code (confirmed)    HH Diet    SQ heparin.            **Electronically signed by Laurelyn Sickle, MD on 01/16/2020 14:26**        **Electronically signed by Laurelyn Sickle, MD on 01/16/2020  14:26**

## 2020-01-10 NOTE — Progress Notes (Signed)
 Supervisory Note For Resident I performed a history and physical examination  of the patient and discussed the management with the resident. I reviewed the  resident's note and agree with the documented findings and plan of care,  except as documented below. Yes, Additional Notes Agree with  asterixis/myoclonus as part of a toxic/metabolic encephalopathy with multiple  metabolic abnormalities. Please obtain carbamazepine level -- ideally from  yesterday's blood sample when he was at his worst and compared to now when he  is better.            **Electronically signed by Virgina Norfolk, MD on 01/16/2020 17:15**

## 2020-01-10 NOTE — Progress Notes (Signed)
 **Subjective**    Subjective Overnight:    - No acute events        This AM:    - Feels well today, but doesn't really understand why he can't return to the  group home with a foley in    - Denies chest pain, nausea, vomiting, diarrhea, and headache.    Tolerating Diet Yes.    **Subjective**    Subjective Overnight:    - No acute events        This AM:    - Feels well today, but doesn't really understand why he can't return to the  group home with a foley in    - Denies chest pain, nausea, vomiting, diarrhea, and headache.    Tolerating Diet Yes.    **Subjective**    Subjective Overnight:    - No acute events        This AM:    - Feels well today, but doesn't really understand why he can't return to the  group home with a foley in    - Denies chest pain, nausea, vomiting, diarrhea, and headache.    Tolerating Diet Yes.    **ROS**    Complete Review of Systems All other systems reviewed and negative except as  noted in the HPI.    **ROS**    Complete Review of Systems All other systems reviewed and negative except as  noted in the HPI.    **ROS**    Complete Review of Systems All other systems reviewed and negative except as  noted in the HPI.    **Exam**    Comment Weight: 74.8 (9/9) -> 75.16 -> 75.5 -> 75.6 kg (9/12)    I/O: ?/280 N:-2620?        Gen: NAD, Lying in bed    HEENT: PERRL, JVD unable to be observed    CV: RRR, no murmurs, rubs, gallops    Lung: CTAB, normal WOB    Abd: Soft, Nontender, no rebound, no guarding    Ext: 1+ bilateral LE edema    Neuro: no focal deficits .      Vital Signs    01/22/2020 11:57              -  Temperature: 36.5 (35-37.8Cel)          -  Site: Oral          -  Heart Rate: 85 (60-90)          -  Site: Monitor          -  BP: 102/70 (90-140/60-90)          -  Site: Left Arm          -  Position: Sitting          -  Method: Automated          -  Respirations: 18 (12-20)          -  O2 Saturation (%): 95          -  O2 Delivery Method: Room Air          -   MEWS Vital Sign Score: 0      01/22/2020 10:56              -  Lattie Corns Fall Risk Total: 100      01/21/2020 11:45              -  Fingerstick Glucose: 358  01/21/2020 06:25              -  Weight: 75.6kg      CFS Vital Signs CS    01/22/2020 14:59              -  Shift Intake Total:          -  Shift Output Total:          -  Shift Balance: -          I have reviewed and agree with vital signs as listed in the EMR Yes Time  01/22/2020 14:58.    **Exam**    Comment Weight: 74.8 (9/9) -> 75.16 -> 75.5 -> 75.6 kg (9/12)    I/O: ?/280 N:-2620?        Gen: NAD, Lying in bed    HEENT: PERRL, JVD unable to be observed    CV: RRR, no murmurs, rubs, gallops    Lung: CTAB, normal WOB    Abd: Soft, Nontender, no rebound, no guarding    Ext: 1+ bilateral LE edema    Neuro: no focal deficits .      Vital Signs    01/22/2020 11:57              -  Temperature: 36.5 (35-37.8Cel)          -  Site: Oral          -  Heart Rate: 85 (60-90)          -  Site: Monitor          -  BP: 102/70 (90-140/60-90)          -  Site: Left Arm          -  Position: Sitting          -  Method: Automated          -  Respirations: 18 (12-20)          -  O2 Saturation (%): 95          -  O2 Delivery Method: Room Air          -  MEWS Vital Sign Score: 0      01/22/2020 10:56              -  Morse Fall Risk Total: 100      01/21/2020 11:45              -  Fingerstick Glucose: 358      01/21/2020 06:25              -  Weight: 75.6kg      CFS Vital Signs CS    01/22/2020 14:59              -  Shift Intake Total:          -  Shift Output Total:          -  Shift Balance: -          I have reviewed and agree with vital signs as listed in the EMR Yes Time  01/22/2020 14:58.    **Exam**    Comment Weight: 74.8 (9/9) -> 75.16 -> 75.5 -> 75.6 kg (9/12)    I/O: ?/280 N:-2620?        Gen: NAD, Lying in bed    HEENT: PERRL, JVD unable to be observed    CV: RRR,  no murmurs, rubs,  gallops    Lung: CTAB, normal WOB    Abd: Soft, Nontender, no rebound, no guarding    Ext: 1+ bilateral LE edema    Neuro: no focal deficits .      Vital Signs    01/22/2020 11:57              -  Temperature: 36.5 (35-37.8Cel)          -  Site: Oral          -  Heart Rate: 85 (60-90)          -  Site: Monitor          -  BP: 102/70 (90-140/60-90)          -  Site: Left Arm          -  Position: Sitting          -  Method: Automated          -  Respirations: 18 (12-20)          -  O2 Saturation (%): 95          -  O2 Delivery Method: Room Air          -  MEWS Vital Sign Score: 0      01/22/2020 10:56              -  Morse Fall Risk Total: 100      01/21/2020 11:45              -  Fingerstick Glucose: 358      01/21/2020 06:25              -  Weight: 75.6kg      CFS Vital Signs CS    01/22/2020 14:59              -  Shift Intake Total:          -  Shift Output Total:          -  Shift Balance: -          I have reviewed and agree with vital signs as listed in the EMR Yes Time  01/22/2020 14:58.    **Assessment and Plan**    Medication              -   **FUROSEMIDE (LASIX)** 40 MG Oral ONE TIME for 1 Doses           -   **TAMSULOSIN (FLOMAX)** 0.4 MG Oral QHS for 60 Days           -   **CIPROFLOXACIN (CIPRO)** 750 MG Oral BID First Dose Now for 60 Days           -   **FUROSEMIDE (LASIX)** 40 MG Oral QDAY First Dose Now for 60 Days           -   **HYDRALAZINE (APRESOLINE)** 50 MG Oral TID for 60 Days, Clinician JXB:JYNW FOR SYSTOLIC BP <90           -   **METOPROLOL XL (TOPROL XL)** 75 MG Oral QDAY for 60 Days           -   **LACTULOSE (CEPHULAC)** 20 G Oral BID for 60 Days           -   **IPRATROPIUM/ALBUTEROL SULFATE (DUONEB)** 3 ML Inhalation Q4H for 60 Days           -   **  PREDNISONE (DELTASONE)** 40 MG Oral QDAY First Dose Now for 60 Days           -   **GUAIFENESIN SYRUP** 200 MG Oral Q4HPRN COUGH for 60 Days           -   **REMOVE PATCH**  1 PATCH Miscellaneous QDAY for 60 Days, Clinician Dir:OLD NICOTINE PATCH           -   **PATCH INTACT QSHIFT (PATCH INTACT INSPECTION)** 1 PATCH Topical CHECK for 60 Days, Clinician QIO:[id number] (NICOTINE) PATCH EVERY SHIFT           -   **NICOTINE TRANSDERMAL (HABITROL TRANSDERMAL)** 21 MG Topical QDAY First Dose Now for 60 Days           -   **ASPIRIN CHEWABLE** 81 MG Oral QDAY for 60 Days           -   **CARBAMAZEPINE (TEGRETOL)** 400 MG Oral QAM for 60 Days           -   ** ** ** **INSULIN LISPRO (HUMALOG) (HUMALOG)******** Sliding Scale Subcutaneous TIDWM for 60 Days      For Glucose 80 to 150 Give 0 Units    For Glucose 151 to 200 Give 2 Units    For Glucose 201 to 250 Give 3 Units    For Glucose 251 to 300 Give 4 Units    Notify Physician if: Call HO for glucose > 300          -   **HYDROCORTISONE 1% CREAM** 1 APP Topical BID for 60 Days, Clinician Dir:SEE EXTENDED INSTRUCTIONS INTERCHANGE FOR 2.5%           -   **FORMOTEROL FUMARATE (PERFOROMIST)** 20 MCG Inhalation BID for 60 Days, Clinician UXL:[id number] FOR TRELEGY SEE ORDER #31           -   **MIRTAZAPINE (REMERON)** 15 MG Oral QHS for 60 Days           -   **SENNA (SENOKOT)** 8.6 MG Oral BID for 60 Days           -   **BUDESONIDE INHALATION (PULMICORT RESPULES)** 0.25 MG Inhalation BID for 60 Days, Clinician GUY:[id number] FOR TRELEGY SEE ORDER #31           -   **OLANZAPINE (ZYPREXA)** 10 MG Oral QHS for 60 Days           -   **BRIMONIDINE 0.2% OPHTH (ALPHAGAN 0.2% OPHTH)** 1 DROP Both Eyes SOL BID for 60 Days, Clinician LOV:[id number] FOR 0.15% STRENGTH           -   **DORZOLAMIDE 2% OPHTH (TRUSOPT 2% OPHTH)** 1 DROP Both Eyes SOL BID for 60 Days           -   **DOCUSATE SODIUM (COLACE)** 100 MG Oral BID for 60 Days           -   **CARBAMAZEPINE (TEGRETOL)** 600 MG Oral QHS for 60 Days           -   **ATORVASTATIN (LIPITOR)** 20 MG Oral QHS for 60 Days           -   **QUETIAPINE FUMARATE (SEROQUEL)** 400 MG Oral  QHS for 60 Days           -   **GLUCAGON** 1 MG Intramuscular PRN BG < 60 AND NO IV ACCESS for 60 Days, Clinician Dir:MAY BE GIVEN IM OR SUB-Q           -   **  DEXTROSE 50% SYRINGE (DEXTROSE 50%)** 12.5 G Intravenous PRN BG < 60 AND CAN NOT TAKE PO for 60 Days           -   **BISACODYL (DULCOLAX)** 10 MG Rectal QDAYPRN CONSTIPATION for 60 Days           -   **HEPARIN** 5000 UNITS Subcutaneous Q12H for 60 Days           -   **IPRATROPIUM/ALBUTEROL SULFATE (DUONEB)** 3 ML Inhalation Q4HPRN SOB/WHEEZING for 60 Days           -   **POLYETHYLENE GLYCOL (MIRALAX)** 17 G Oral QDAYPRN CONSTIPATION for 60 Days           -   **AMMONIUM LACTATE 12% LOTION (LAC-HYDRIN 12%)** 1 APP Topical QDAYPRN RASH for 60 Days           -   **ALBUTEROL INHALER (VENTOLIN HFA)** 2 PUFF Inhalation Q4HPRN SHORTNESS OF BREATH for 60 Days           -   **ACETAMINOPHEN (TYLENOL)** 650 MG Oral Q6HPRN PAIN for 60 Days           -   **BENZONATATE** 100 MG Oral TIDPRN COUGH for 60 Days           Comment Arthur Young is a 54 yo male with history of HFrEF (EF 30%  05/2019), COPD not on home O2, HTN, DM, and, Schizoaffective disorder  presenting from a group home for shortness of breath.        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 History of previous admissions for CHF exacerbation requiring IV  Diuresis. Previous dry weight of 75kg with most recent weight 81kg in the ED.  Etiology of exacerbation unclear, but probable medication non-adherence given  poot historian. Spoke to patient with his daughter Donnie Aho) present on (9/9)  regarding CHF/COPD exacerbation and possible malignancy with subsequent steps  -- voiced understanding and that she would be the most appropriate HCP.    PRELOAD: Continued fluid overloaded on exam with lower extremity edema and CXR  notable for increased pulmonary vascular  congestion with left pleural effusion    - Double home lasix and give 80mg  po this AM with persistently increase LE  edema    --- Goal 0.5 - 1.0L (9/13) -- Unable to elucidate appropriate net fluid  status given unknown Intake; however, patient diuresed well on 80mg  PO    - Strict I&O, Daily standing weights    PUMP    Echo 09/02 with normal sized LV with normal LV thickness and severely reduced  LV systolic function (EF 20-25%), Flattened septum consistent with RV  pressure/volume overload, Severe global hypokinesis of LV; Right Ventricle  moderate to severely dilated with severely reduced function, trace MR, severe  TR    - Metroprolol 75mg  XL qd    - Repeat inpatient TTE as outpatient to re-evaluate right sided dysfunction    AFTERLOAD    - Hydralazine 50mg  TID    CORONARIES    - Continue home ASA and Atorvastatin 80mg     Neurohormonal: Previously discontinued Spironolactone for hyperkalemia in the  setting of K-sparing diuretics    - Can consider Spironolactone for GDMT if hyperkalemia stable    - Can consider SGLT2 inhibior as outpatient given hx DM        #  Acute COPD Exacerbation    # Pseudomonas Pneumonia    PFTS (07/21/19) FEV1 24% with FEV1/FVC 40% GOLD IV. Pulm previously wanted  outpatient CT Chest, as patient is inpatient and being actively diuresed with  changes in mental status, will go ahead and pursue inpatient CT Chest.  SSA/SSB, RF per previous Pulm note Negative. Tmax 38.0, BCx drawn (NGTD), and  SBPs in 90s. Afebrile as of (9/9) without leukocytosis.    - Sputum Cx: Pansensitive Pseudomonas    - Cont Ciprofloxacin (9/11-) given sensitive on culture    --- End date 9/14    --- EKG (9/12) QTc 473    --- S/p Cefepime 1g Q24H (9/8 - 9/11), Vancomycin (9/7-9/9), Ceftriaxone 1g  Q24H (9/7-9/8), Azithromycin 500mg  IV Q24H (9/2-3)    - Pulmonary c/s, recs appreciated    --- Plan for prolonged prednisone taper    --- do not anticipate RHC for this patient given poor functional status and  poor candidacy  for PHTN therapies    - Prednisone 40mg  qd 5 days (9/7-)    --- S/p Prednisone 40mg  (s/p 9/1-9/5)    --- Plan to decrease by 10 mg every 7 days until off    -- Duonebs Q4H    - oxygen goal > 88%        #Pulmonay Nodules    CT Chest Impression (9/7) noted new innumerable bilateral pulmonary nodules  are concerning for metastasis. CT of the abdomen and pelvis with IV contrast  is recommended for further evaluation with a small focal lucency in the  manubrium of the sternum could represent a lytic metastasis. Given possible  metastatis, will look for primary cancer site    - PSA WNL    - Definitive biopsy would likely be a VATS wedge -- no primary tumor on CT  A/P    - F/u outpatient clinic        #AKI    #Urinary Retension    Cr 1.85 on admission, baseline Cr possibly ~1.2-1.3. AKI probable cardiorenal  in the setting of CHF exacerbation, 1.67 (9/5), will decrease magnitude of  diuresis as patient is approaching a euvolemic state. Creatinine (9/5) 1.83 ->  2.02 -> 1.99 (9/6) -> 2.55 (9/7) -> 1.92 (9/9). Etiology of AKI most likely  2/2 new urinary retention as found on RUQ Ultrasound (9/7) and improvement in  kidney function with placement of foley catheter (9/8).    - Failed trial of void 9/10    - Cont. Flomax    - Plan to f/u urology as outpatient for next trial of void    - Monitor SCr        #Anemia    #Polycythemia    # Iron Deficiency Anemia    Hgb 18.2 and Hct 59.9 possibly 2/2 COPD vs possible OSA. Can consider empiric  treatment of OSA with trial of CPAP if patient desaturates overnight. Iron  studies significant for Iron Deficiency anemia Iron 47, %Fe Saturation 12,  Ferritin 68, Transferrin 270. 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    - IV Venofer iso heart failure    - Consider outpatient workup (JAK2 testing and BM)    - Lower Extremity Ultrasound negative for DVT    - Outpatient sleep study        #Cholestatic Liver Enzymes -- Elevated Alk Phos and  GGT. Unknown if patient  has Hx of cirrhosis although with normal AST/ALT, less likely, but would  prefer  inpatient imaging to assess. Eyes appear slightly icteric (9/6) PM  without significant elevation of Bilirubin.    - RUQ Ultrasound (9/6) Impression noted Massive distention of the urinary  bladder with estimated volume of 1.5 L and Unremarkable appearance of the  liver        #Constipation    Pt has hx of constipation and reports constipation on day of admission for the  past several days    - Lactulose    - Senna BID    - cont home miralax and colace        #DM    On Metformin outpatient. Episode of hypoglycemia to 36 (9/3) AM. D/Ced Lantus  and 4u Lispro    - A1c 7.5    - SSI TIDWM    - hold home metformin        Chronic:    - Tremor: Patient has baseline upper extremity tremors with home psychiatric  medications    - Schizoaffective disorder: Continue home Carbamazepine 400 mg qam and 600 mg  qhs, Mirtazapine 15 mg qhs, Zyprexa 10 mg qhs, Seroquel 300 mg qhs    - HLD: Continue home atorvastatin        Full code (confirmed)    HH Diet    SQ heparin    Daughter: Hulda Humphrey: [phone]    Social: Pt lives in a group home.    **Assessment and Plan**    Medication              -   **FUROSEMIDE (LASIX)** 40 MG Oral ONE TIME for 1 Doses           -   **TAMSULOSIN (FLOMAX)** 0.4 MG Oral QHS for 60 Days           -   **CIPROFLOXACIN (CIPRO)** 750 MG Oral BID First Dose Now for 60 Days           -   **FUROSEMIDE (LASIX)** 40 MG Oral QDAY First Dose Now for 60 Days           -   **HYDRALAZINE (APRESOLINE)** 50 MG Oral TID for 60 Days, Clinician OAC:ZYSA FOR SYSTOLIC BP <90           -   **METOPROLOL XL (TOPROL XL)** 75 MG Oral QDAY for 60 Days           -   **LACTULOSE (CEPHULAC)** 20 G Oral BID for 60 Days           -   **IPRATROPIUM/ALBUTEROL SULFATE (DUONEB)** 3 ML Inhalation Q4H for 60 Days           -   **PREDNISONE (DELTASONE)** 40 MG Oral QDAY First Dose Now for 60 Days           -    **GUAIFENESIN SYRUP** 200 MG Oral Q4HPRN COUGH for 60 Days           -   **REMOVE PATCH** 1 PATCH Miscellaneous QDAY for 60 Days, Clinician Dir:OLD NICOTINE PATCH           -   **PATCH INTACT QSHIFT (PATCH INTACT INSPECTION)** 1 PATCH Topical CHECK for 60 Days, Clinician YTK:[id number] (NICOTINE) PATCH EVERY SHIFT           -   **NICOTINE TRANSDERMAL (HABITROL TRANSDERMAL)** 21 MG Topical QDAY First Dose Now for 60 Days           -   **ASPIRIN CHEWABLE** 81 MG Oral QDAY for 60 Days           -   **  CARBAMAZEPINE (TEGRETOL)** 400 MG Oral QAM for 60 Days           -   ** ** ** **INSULIN LISPRO (HUMALOG) (HUMALOG)******** Sliding Scale Subcutaneous TIDWM for 60 Days      For Glucose 80 to 150 Give 0 Units    For Glucose 151 to 200 Give 2 Units    For Glucose 201 to 250 Give 3 Units    For Glucose 251 to 300 Give 4 Units    Notify Physician if: Call HO for glucose > 300          -   **HYDROCORTISONE 1% CREAM** 1 APP Topical BID for 60 Days, Clinician Dir:SEE EXTENDED INSTRUCTIONS INTERCHANGE FOR 2.5%           -   **FORMOTEROL FUMARATE (PERFOROMIST)** 20 MCG Inhalation BID for 60 Days, Clinician JJO:[id number] FOR TRELEGY SEE ORDER #31           -   **MIRTAZAPINE (REMERON)** 15 MG Oral QHS for 60 Days           -   **SENNA (SENOKOT)** 8.6 MG Oral BID for 60 Days           -   **BUDESONIDE INHALATION (PULMICORT RESPULES)** 0.25 MG Inhalation BID for 60 Days, Clinician WFU:[id number] FOR TRELEGY SEE ORDER #31           -   **OLANZAPINE (ZYPREXA)** 10 MG Oral QHS for 60 Days           -   **BRIMONIDINE 0.2% OPHTH (ALPHAGAN 0.2% OPHTH)** 1 DROP Both Eyes SOL BID for 60 Days, Clinician URK:[id number] FOR 0.15% STRENGTH           -   **DORZOLAMIDE 2% OPHTH (TRUSOPT 2% OPHTH)** 1 DROP Both Eyes SOL BID for 60 Days           -   **DOCUSATE SODIUM (COLACE)** 100 MG Oral BID for 60 Days           -   **CARBAMAZEPINE (TEGRETOL)** 600 MG Oral QHS for 60 Days           -   **ATORVASTATIN (LIPITOR)**  20 MG Oral QHS for 60 Days           -   **QUETIAPINE FUMARATE (SEROQUEL)** 400 MG Oral QHS for 60 Days           -   **GLUCAGON** 1 MG Intramuscular PRN BG < 60 AND NO IV ACCESS for 60 Days, Clinician Dir:MAY BE GIVEN IM OR SUB-Q           -   **DEXTROSE 50% SYRINGE (DEXTROSE 50%)** 12.5 G Intravenous PRN BG < 60 AND CAN NOT TAKE PO for 60 Days           -   **BISACODYL (DULCOLAX)** 10 MG Rectal QDAYPRN CONSTIPATION for 60 Days           -   **HEPARIN** 5000 UNITS Subcutaneous Q12H for 60 Days           -   **IPRATROPIUM/ALBUTEROL SULFATE (DUONEB)** 3 ML Inhalation Q4HPRN SOB/WHEEZING for 60 Days           -   **POLYETHYLENE GLYCOL (MIRALAX)** 17 G Oral QDAYPRN CONSTIPATION for 60 Days           -   **AMMONIUM LACTATE 12% LOTION (LAC-HYDRIN 12%)** 1 APP Topical QDAYPRN RASH for 60 Days           -   **  ALBUTEROL INHALER (VENTOLIN HFA)** 2 PUFF Inhalation Q4HPRN SHORTNESS OF BREATH for 60 Days           -   **ACETAMINOPHEN (TYLENOL)** 650 MG Oral Q6HPRN PAIN for 60 Days           -   **BENZONATATE** 100 MG Oral TIDPRN COUGH for 60 Days           Comment Arthur Young is a 54 yo male with history of HFrEF (EF 30%  05/2019), COPD not on home O2, HTN, DM, and, Schizoaffective disorder  presenting from a group home for shortness of breath.        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 History of previous admissions for CHF exacerbation requiring IV  Diuresis. Previous dry weight of 75kg with most recent weight 81kg in the ED.  Etiology of exacerbation unclear, but probable medication non-adherence given  poot historian. Spoke to patient with his daughter Donnie Aho) present on (9/9)  regarding CHF/COPD exacerbation and possible malignancy with subsequent steps  -- voiced understanding and that she would be the most appropriate HCP.    PRELOAD: Continued fluid overloaded  on exam with lower extremity edema and CXR  notable for increased pulmonary vascular congestion with left pleural effusion    - Double home lasix and give 80mg  po this AM with persistently increase LE  edema    --- Goal 0.5 - 1.0L (9/13) -- Unable to elucidate appropriate net fluid  status given unknown Intake; however, patient diuresed well on 80mg  PO    - Strict I&O, Daily standing weights    PUMP    Echo 09/02 with normal sized LV with normal LV thickness and severely reduced  LV systolic function (EF 20-25%), Flattened septum consistent with RV  pressure/volume overload, Severe global hypokinesis of LV; Right Ventricle  moderate to severely dilated with severely reduced function, trace MR, severe  TR    - Metroprolol 75mg  XL qd    - Repeat inpatient TTE as outpatient to re-evaluate right sided dysfunction    AFTERLOAD    - Hydralazine 50mg  TID    CORONARIES    - Continue home ASA and Atorvastatin 80mg     Neurohormonal: Previously discontinued Spironolactone for hyperkalemia in the  setting of K-sparing diuretics    - Can consider Spironolactone for GDMT if hyperkalemia stable    - Can consider SGLT2 inhibior as outpatient given hx DM        # Acute COPD Exacerbation    # Pseudomonas Pneumonia    PFTS (07/21/19) FEV1 24% with FEV1/FVC 40% GOLD IV. Pulm previously wanted  outpatient CT Chest, as patient is inpatient and being actively diuresed with  changes in mental status, will go ahead and pursue inpatient CT Chest.  SSA/SSB, RF per previous Pulm note Negative. Tmax 38.0, BCx drawn (NGTD), and  SBPs in 90s. Afebrile as of (9/9) without leukocytosis.    - Sputum Cx: Pansensitive Pseudomonas    - Cont Ciprofloxacin (9/11-) given sensitive on culture    --- End date 9/14    --- EKG (9/12) QTc 473    --- S/p Cefepime 1g Q24H (9/8 - 9/11), Vancomycin (9/7-9/9), Ceftriaxone 1g  Q24H (9/7-9/8), Azithromycin 500mg  IV Q24H (9/2-3)    - Pulmonary c/s, recs appreciated    --- Plan for prolonged prednisone taper    ---  do  not anticipate RHC for this patient given poor functional status and  poor candidacy for PHTN therapies    - Prednisone 40mg  qd 5 days (9/7-)    --- S/p Prednisone 40mg  (s/p 9/1-9/5)    --- Plan to decrease by 10 mg every 7 days until off    -- Duonebs Q4H    - oxygen goal > 88%        #Pulmonay Nodules    CT Chest Impression (9/7) noted new innumerable bilateral pulmonary nodules  are concerning for metastasis. CT of the abdomen and pelvis with IV contrast  is recommended for further evaluation with a small focal lucency in the  manubrium of the sternum could represent a lytic metastasis. Given possible  metastatis, will look for primary cancer site    - PSA WNL    - Definitive biopsy would likely be a VATS wedge -- no primary tumor on CT  A/P    - F/u outpatient clinic        #AKI    #Urinary Retension    Cr 1.85 on admission, baseline Cr possibly ~1.2-1.3. AKI probable cardiorenal  in the setting of CHF exacerbation, 1.67 (9/5), will decrease magnitude of  diuresis as patient is approaching a euvolemic state. Creatinine (9/5) 1.83 ->  2.02 -> 1.99 (9/6) -> 2.55 (9/7) -> 1.92 (9/9). Etiology of AKI most likely  2/2 new urinary retention as found on RUQ Ultrasound (9/7) and improvement in  kidney function with placement of foley catheter (9/8).    - Failed trial of void 9/10    - Cont. Flomax    - Plan to f/u urology as outpatient for next trial of void    - Monitor SCr        #Anemia    #Polycythemia    # Iron Deficiency Anemia    Hgb 18.2 and Hct 59.9 possibly 2/2 COPD vs possible OSA. Can consider empiric  treatment of OSA with trial of CPAP if patient desaturates overnight. Iron  studies significant for Iron Deficiency anemia Iron 47, %Fe Saturation 12,  Ferritin 68, Transferrin 270. 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    - IV Venofer iso heart failure    - Consider outpatient workup (JAK2 testing and BM)    - Lower Extremity Ultrasound negative for DVT     - Outpatient sleep study        #Cholestatic Liver Enzymes -- Elevated Alk Phos and GGT. Unknown if patient  has Hx of cirrhosis although with normal AST/ALT, less likely, but would  prefer inpatient imaging to assess. Eyes appear slightly icteric (9/6) PM  without significant elevation of Bilirubin.    - RUQ Ultrasound (9/6) Impression noted Massive distention of the urinary  bladder with estimated volume of 1.5 L and Unremarkable appearance of the  liver        #Constipation    Pt has hx of constipation and reports constipation on day of admission for the  past several days    - Lactulose    - Senna BID    - cont home miralax and colace        #DM    On Metformin outpatient. Episode of hypoglycemia to 36 (9/3) AM. D/Ced Lantus  and 4u Lispro    - A1c 7.5    - SSI TIDWM    - hold home metformin        Chronic:    -  Tremor: Patient has baseline upper extremity tremors with home psychiatric  medications    - Schizoaffective disorder: Continue home Carbamazepine 400 mg qam and 600 mg  qhs, Mirtazapine 15 mg qhs, Zyprexa 10 mg qhs, Seroquel 300 mg qhs    - HLD: Continue home atorvastatin        Full code (confirmed)    HH Diet    SQ heparin    Daughter: Hulda Humphrey: [phone]    Social: Pt lives in a group home.    **Assessment and Plan**    Medication              -   **FUROSEMIDE (LASIX)** 40 MG Oral ONE TIME for 1 Doses           -   **TAMSULOSIN (FLOMAX)** 0.4 MG Oral QHS for 60 Days           -   **CIPROFLOXACIN (CIPRO)** 750 MG Oral BID First Dose Now for 60 Days           -   **FUROSEMIDE (LASIX)** 40 MG Oral QDAY First Dose Now for 60 Days           -   **HYDRALAZINE (APRESOLINE)** 50 MG Oral TID for 60 Days, Clinician DGU:YQIH FOR SYSTOLIC BP <90           -   **METOPROLOL XL (TOPROL XL)** 75 MG Oral QDAY for 60 Days           -   **LACTULOSE (CEPHULAC)** 20 G Oral BID for 60 Days           -   **IPRATROPIUM/ALBUTEROL SULFATE (DUONEB)** 3 ML Inhalation Q4H for 60 Days           -    **PREDNISONE (DELTASONE)** 40 MG Oral QDAY First Dose Now for 60 Days           -   **GUAIFENESIN SYRUP** 200 MG Oral Q4HPRN COUGH for 60 Days           -   **REMOVE PATCH** 1 PATCH Miscellaneous QDAY for 60 Days, Clinician Dir:OLD NICOTINE PATCH           -   **PATCH INTACT QSHIFT (PATCH INTACT INSPECTION)** 1 PATCH Topical CHECK for 60 Days, Clinician KVQ:[id number] (NICOTINE) PATCH EVERY SHIFT           -   **NICOTINE TRANSDERMAL (HABITROL TRANSDERMAL)** 21 MG Topical QDAY First Dose Now for 60 Days           -   **ASPIRIN CHEWABLE** 81 MG Oral QDAY for 60 Days           -   **CARBAMAZEPINE (TEGRETOL)** 400 MG Oral QAM for 60 Days           -   ** ** ** **INSULIN LISPRO (HUMALOG) (HUMALOG)******** Sliding Scale Subcutaneous TIDWM for 60 Days      For Glucose 80 to 150 Give 0 Units    For Glucose 151 to 200 Give 2 Units    For Glucose 201 to 250 Give 3 Units    For Glucose 251 to 300 Give 4 Units    Notify Physician if: Call HO for glucose > 300          -   **HYDROCORTISONE 1% CREAM** 1 APP Topical BID for 60 Days, Clinician Dir:SEE EXTENDED INSTRUCTIONS INTERCHANGE FOR 2.5%           -   **FORMOTEROL FUMARATE (PERFOROMIST)** 20  MCG Inhalation BID for 60 Days, Clinician QIH:[id number] FOR TRELEGY SEE ORDER #31           -   **MIRTAZAPINE (REMERON)** 15 MG Oral QHS for 60 Days           -   **SENNA (SENOKOT)** 8.6 MG Oral BID for 60 Days           -   **BUDESONIDE INHALATION (PULMICORT RESPULES)** 0.25 MG Inhalation BID for 60 Days, Clinician FIE:[id number] FOR TRELEGY SEE ORDER #31           -   **OLANZAPINE (ZYPREXA)** 10 MG Oral QHS for 60 Days           -   **BRIMONIDINE 0.2% OPHTH (ALPHAGAN 0.2% OPHTH)** 1 DROP Both Eyes SOL BID for 60 Days, Clinician SAY:[id number] FOR 0.15% STRENGTH           -   **DORZOLAMIDE 2% OPHTH (TRUSOPT 2% OPHTH)** 1 DROP Both Eyes SOL BID for 60 Days           -   **DOCUSATE SODIUM (COLACE)** 100 MG Oral BID for 60 Days           -    **CARBAMAZEPINE (TEGRETOL)** 600 MG Oral QHS for 60 Days           -   **ATORVASTATIN (LIPITOR)** 20 MG Oral QHS for 60 Days           -   **QUETIAPINE FUMARATE (SEROQUEL)** 400 MG Oral QHS for 60 Days           -   **GLUCAGON** 1 MG Intramuscular PRN BG < 60 AND NO IV ACCESS for 60 Days, Clinician Dir:MAY BE GIVEN IM OR SUB-Q           -   **DEXTROSE 50% SYRINGE (DEXTROSE 50%)** 12.5 G Intravenous PRN BG < 60 AND CAN NOT TAKE PO for 60 Days           -   **BISACODYL (DULCOLAX)** 10 MG Rectal QDAYPRN CONSTIPATION for 60 Days           -   **HEPARIN** 5000 UNITS Subcutaneous Q12H for 60 Days           -   **IPRATROPIUM/ALBUTEROL SULFATE (DUONEB)** 3 ML Inhalation Q4HPRN SOB/WHEEZING for 60 Days           -   **POLYETHYLENE GLYCOL (MIRALAX)** 17 G Oral QDAYPRN CONSTIPATION for 60 Days           -   **AMMONIUM LACTATE 12% LOTION (LAC-HYDRIN 12%)** 1 APP Topical QDAYPRN RASH for 60 Days           -   **ALBUTEROL INHALER (VENTOLIN HFA)** 2 PUFF Inhalation Q4HPRN SHORTNESS OF BREATH for 60 Days           -   **ACETAMINOPHEN (TYLENOL)** 650 MG Oral Q6HPRN PAIN for 60 Days           -   **BENZONATATE** 100 MG Oral TIDPRN COUGH for 60 Days           Comment Arthur Young is a 54 yo male with history of HFrEF (EF 30%  05/2019), COPD not on home O2, HTN, DM, and, Schizoaffective disorder  presenting from a group home for shortness of breath.        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 History of previous admissions for CHF exacerbation requiring IV  Diuresis. Previous dry weight of 75kg with most recent weight 81kg in the ED.  Etiology of exacerbation unclear, but probable medication non-adherence given  poot historian. Spoke to patient with his daughter Donnie Aho) present on (9/9)  regarding CHF/COPD exacerbation and possible malignancy with subsequent steps  -- voiced  understanding and that she would be the most appropriate HCP.    PRELOAD: Continued fluid overloaded on exam with lower extremity edema and CXR  notable for increased pulmonary vascular congestion with left pleural effusion    - Double home lasix and give 80mg  po this AM with persistently increase LE  edema    --- Goal 0.5 - 1.0L (9/13) -- Unable to elucidate appropriate net fluid  status given unknown Intake; however, patient diuresed well on 80mg  PO    - Strict I&O, Daily standing weights    PUMP    Echo 09/02 with normal sized LV with normal LV thickness and severely reduced  LV systolic function (EF 20-25%), Flattened septum consistent with RV  pressure/volume overload, Severe global hypokinesis of LV; Right Ventricle  moderate to severely dilated with severely reduced function, trace MR, severe  TR    - Metroprolol 75mg  XL qd    - Repeat inpatient TTE as outpatient to re-evaluate right sided dysfunction    AFTERLOAD    - Hydralazine 50mg  TID    CORONARIES    - Continue home ASA and Atorvastatin 80mg     Neurohormonal: Previously discontinued Spironolactone for hyperkalemia in the  setting of K-sparing diuretics    - Can consider Spironolactone for GDMT if hyperkalemia stable    - Can consider SGLT2 inhibior as outpatient given hx DM        # Acute COPD Exacerbation    # Pseudomonas Pneumonia    PFTS (07/21/19) FEV1 24% with FEV1/FVC 40% GOLD IV. Pulm previously wanted  outpatient CT Chest, as patient is inpatient and being actively diuresed with  changes in mental status, will go ahead and pursue inpatient CT Chest.  SSA/SSB, RF per previous Pulm note Negative. Tmax 38.0, BCx drawn (NGTD), and  SBPs in 90s. Afebrile as of (9/9) without leukocytosis.    - Sputum Cx: Pansensitive Pseudomonas    - Cont Ciprofloxacin (9/11-) given sensitive on culture    --- End date 9/14    --- EKG (9/12) QTc 473    --- S/p Cefepime 1g Q24H (9/8 - 9/11), Vancomycin (9/7-9/9), Ceftriaxone 1g  Q24H (9/7-9/8), Azithromycin 500mg  IV  Q24H (9/2-3)    - Pulmonary c/s, recs appreciated    --- Plan for prolonged prednisone taper    --- do not anticipate RHC for this patient given poor functional status and  poor candidacy for PHTN therapies    - Prednisone 40mg  qd 5 days (9/7-)    --- S/p Prednisone 40mg  (s/p 9/1-9/5)    --- Plan to decrease by 10 mg every 7 days until off    -- Duonebs Q4H    - oxygen goal > 88%        #Pulmonay Nodules    CT Chest Impression (9/7) noted new innumerable bilateral pulmonary nodules  are concerning for metastasis. CT of the abdomen and pelvis with IV contrast  is recommended for further evaluation with a small focal lucency in the  manubrium of the sternum could represent a lytic metastasis. Given possible  metastatis, will look for primary  cancer site    - PSA WNL    - Definitive biopsy would likely be a VATS wedge -- no primary tumor on CT  A/P    - F/u outpatient clinic        #AKI    #Urinary Retension    Cr 1.85 on admission, baseline Cr possibly ~1.2-1.3. AKI probable cardiorenal  in the setting of CHF exacerbation, 1.67 (9/5), will decrease magnitude of  diuresis as patient is approaching a euvolemic state. Creatinine (9/5) 1.83 ->  2.02 -> 1.99 (9/6) -> 2.55 (9/7) -> 1.92 (9/9). Etiology of AKI most likely  2/2 new urinary retention as found on RUQ Ultrasound (9/7) and improvement in  kidney function with placement of foley catheter (9/8).    - Failed trial of void 9/10    - Cont. Flomax    - Plan to f/u urology as outpatient for next trial of void    - Monitor SCr        #Anemia    #Polycythemia    # Iron Deficiency Anemia    Hgb 18.2 and Hct 59.9 possibly 2/2 COPD vs possible OSA. Can consider empiric  treatment of OSA with trial of CPAP if patient desaturates overnight. Iron  studies significant for Iron Deficiency anemia Iron 47, %Fe Saturation 12,  Ferritin 68, Transferrin 270. 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    - IV Venofer iso heart failure     - Consider outpatient workup (JAK2 testing and BM)    - Lower Extremity Ultrasound negative for DVT    - Outpatient sleep study        #Cholestatic Liver Enzymes -- Elevated Alk Phos and GGT. Unknown if patient  has Hx of cirrhosis although with normal AST/ALT, less likely, but would  prefer inpatient imaging to assess. Eyes appear slightly icteric (9/6) PM  without significant elevation of Bilirubin.    - RUQ Ultrasound (9/6) Impression noted Massive distention of the urinary  bladder with estimated volume of 1.5 L and Unremarkable appearance of the  liver        #Constipation    Pt has hx of constipation and reports constipation on day of admission for the  past several days    - Lactulose    - Senna BID    - cont home miralax and colace        #DM    On Metformin outpatient. Episode of hypoglycemia to 36 (9/3) AM. D/Ced Lantus  and 4u Lispro    - A1c 7.5    - SSI TIDWM    - hold home metformin        Chronic:    - Tremor: Patient has baseline upper extremity tremors with home psychiatric  medications    - Schizoaffective disorder: Continue home Carbamazepine 400 mg qam and 600 mg  qhs, Mirtazapine 15 mg qhs, Zyprexa 10 mg qhs, Seroquel 300 mg qhs    - HLD: Continue home atorvastatin        Full code (confirmed)    HH Diet    SQ heparin    Daughter: Hulda Humphrey: [phone]    Social: Pt lives in a group home.            **Electronically signed by Laurelyn Sickle, MD on 01/22/2020 15:04**        **Electronically signed by Laurelyn Sickle, MD on 01/22/2020 15:04**        **Electronically signed by Laurelyn Sickle,  MD on 01/22/2020 15:04**

## 2020-01-10 NOTE — Consults (Signed)
 **Chief Complaint / HPI**    HPI 54M with jerking/tremulous movements of all extremities.    Subjective Arthur Young is a 54M with PMH of HFrEF (EF 30% 05/2019), COPD not on  home O2, HTN, DM, and, Schizoaffective disorder, hospitalized for HF  exacerbation and has been receiving diuresis. Neurology was consulted for  rhythmic tremors/jerking in all the extremities which came on today.  Apparently, he had been improving medically until today when he was  disoriented/confused this morning and arousable with steral rub, only using  monosyllabic speech, and had new jerking particularly in the right upper  extremity. Had a CTH which was normal, but he looked better upon return from  CT. The patient reports that he has had random jerking movements symmetrically  in all his extremities for a while now, but per primary team the RUE movement  was out of proportion to the rest, and all the movements are more exaggerated  today. He is receiving ASA, atorvastatin, budesonide, heparin prophylaxis,  insulin lispro, metoprolol, mirtazapine, olanzapine, quetiapine.            Admission HPI:    "Patient is a 54 y/o male with history of HFrEF (EF 30% 05/2019), COPD not on  home O2, HTN, DM, and, Schizoaffective disorder presenting from a group home  for shortness of breath. Patient states he has been experiencing dyspnea on  exertion, lower extremity swelling, constipation, and intermittent chest pain  for the past several weeks worsened with exertion. He endorses associated  cough with white and foamy sputum without fevers and chills. He notes that it  has been increasingly difficult to climb up the stairs to his bedroom on the  second floor at his group home where he has previously not had issues climbing  the stairs. He has noticed increased painful swelling of his lower extremities  with an increase in pigment without calor. He endorses constipation for the  past several days, but currently denies nausea, vomiting, diarrhea,  headaches,  and vision changes. At times the patient is a poor historian and is not  familiar with his past medical history, medications, and allergies.        In the Emergency Department, on arrival the patient was saturating 98% on 2L  NC, BPs 130s/80s, rates in the 80s-90s, afebrile, and tachypneic. His weight  is 81kg with most recent dry weight of 75kg on previous discharge. He was  given 40mg  IV lasix and Albuterol inhaler in the ED with some alleviation of  his symptoms."    **Chief Complaint / HPI**    HPI 54M with jerking/tremulous movements of all extremities.    Subjective Arthur Young is a 54M with PMH of HFrEF (EF 30% 05/2019), COPD not on  home O2, HTN, DM, and, Schizoaffective disorder, hospitalized for HF  exacerbation and has been receiving diuresis. Neurology was consulted for  rhythmic tremors/jerking in all the extremities which came on today.  Apparently, he had been improving medically until today when he was  disoriented/confused this morning and arousable with steral rub, only using  monosyllabic speech, and had new jerking particularly in the right upper  extremity. Had a CTH which was normal, but he looked better upon return from  CT. The patient reports that he has had random jerking movements symmetrically  in all his extremities for a while now, but per primary team the RUE movement  was out of proportion to the rest, and all the movements are more exaggerated  today. He is  receiving ASA, atorvastatin, budesonide, heparin prophylaxis,  insulin lispro, metoprolol, mirtazapine, olanzapine, quetiapine.            Admission HPI:    "Patient is a 54 y/o male with history of HFrEF (EF 30% 05/2019), COPD not on  home O2, HTN, DM, and, Schizoaffective disorder presenting from a group home  for shortness of breath. Patient states he has been experiencing dyspnea on  exertion, lower extremity swelling, constipation, and intermittent chest pain  for the past several weeks worsened with exertion. He  endorses associated  cough with white and foamy sputum without fevers and chills. He notes that it  has been increasingly difficult to climb up the stairs to his bedroom on the  second floor at his group home where he has previously not had issues climbing  the stairs. He has noticed increased painful swelling of his lower extremities  with an increase in pigment without calor. He endorses constipation for the  past several days, but currently denies nausea, vomiting, diarrhea, headaches,  and vision changes. At times the patient is a poor historian and is not  familiar with his past medical history, medications, and allergies.        In the Emergency Department, on arrival the patient was saturating 98% on 2L  NC, BPs 130s/80s, rates in the 80s-90s, afebrile, and tachypneic. His weight  is 81kg with most recent dry weight of 75kg on previous discharge. He was  given 40mg  IV lasix and Albuterol inhaler in the ED with some alleviation of  his symptoms."    **Allergies**              -  Depakote          -  Lithium        **Allergies**              -  Depakote          -  Lithium        **Home Medications**    Last Updated: 01/10/2020 20:23  (Complete)  By: Eula Fried, RPH    Active              -   **aspirin**  (Aspirin Low Dose) 81 mg tablet,delayed release (DR/EC)  Directions:  1 tablet oral daily  (Active)          -   **atorvastatin**  (Lipitor) 20 mg Tablet  Directions:  1 tablet oral daily at bedtime  (Active)          -   **brimonidine**  (Alphagan P) 0.15 % Drops  Directions:  1 drop ophthalmic three times a day  (Active)          -   **carBAMazepine**  (Epitol) 200 mg Tablet  Directions:  3 tablet oral daily every evening  (Active)          -   **carBAMazepine**  (Epitol) 200 mg Tablet  Directions:  2 tablet oral daily every morning  (Active)          -   **chlorthalidone** 25 mg Tablet  Directions:  1 tablet oral daily  (Active)          -   **docusate sodium**  (Stool  Softener) 100 mg Capsule  Directions:  1 capsule oral twice a day  (Active)          -   **dorzolamide** 2 % Drops  Directions:  1 drop ophthalmic, both eyes twice a day  (  Active)          -   **fluticasone-umeclidin-vilanter**  (Trelegy Ellipta) 100 mcg-62.5 mcg-25 mcg/actuation blister with device  Directions:  2 puff by inhalation daily every morning  (Active)          -   **furosemide** 40 mg Tablet  Directions:  1 tablet oral daily  (Active)          -   **metFORMIN** 1,000 mg Tablet  Directions:  1 tablet oral twice a day  (Active)          -   **metoprolol succinate** 50 mg Tablet Extended Release 24 hr  Directions:  1.5 tablet oral daily  (Active)          -   **mirtazapine**  (Remeron) 15 mg Tablet  Directions:  1 tablet oral daily at bedtime  (Active)          -   **OLANZapine**  (ZyPREXA) 10 mg Tablet  Directions:  1 tablet oral daily at bedtime  (Active)          -   **polyethylene glycol 3350** 17 gram Powder in Packet  Directions:  1 each oral daily  (Active)          -   **QUEtiapine** 400 mg Tablet  Directions:  1 tablet oral daily at bedtime  (Active)          -   **sennosides** 8.6 mg Tablet  Directions:  2 tablet oral twice a day  (Active)      Active PRN              -   **albuterol sulfate**  (ProAir HFA) 90 mcg HFA Aerosol Inhaler  Directions:  2 puff by inhalation every four hours PRN shortness of breath  (Active)          -   **ammonium lactate**  (Lac-Hydrin Five) 5 % Lotion  Directions:  1 application topical daily PRN rash  (Active)          -   **bisacodyl** 10 mg Suppository  Directions:  1 suppository per rectum every seventy two hours PRN constipation  (Active)          -   **hydrocortisone** 2.5 % Cream  Directions:  1 application topical twice a day PRN rash  (Active)        **Home Medications**    Last Updated: 01/10/2020 20:23  (Complete)  By: Eula Fried, RPH    Active              -   **aspirin**  (Aspirin Low Dose) 81 mg tablet,delayed  release (DR/EC)  Directions:  1 tablet oral daily  (Active)          -   **atorvastatin**  (Lipitor) 20 mg Tablet  Directions:  1 tablet oral daily at bedtime  (Active)          -   **brimonidine**  (Alphagan P) 0.15 % Drops  Directions:  1 drop ophthalmic three times a day  (Active)          -   **carBAMazepine**  (Epitol) 200 mg Tablet  Directions:  3 tablet oral daily every evening  (Active)          -   **carBAMazepine**  (Epitol) 200 mg Tablet  Directions:  2 tablet oral daily every morning  (Active)          -   **chlorthalidone** 25 mg Tablet  Directions:  1  tablet oral daily  (Active)          -   **docusate sodium**  (Stool Softener) 100 mg Capsule  Directions:  1 capsule oral twice a day  (Active)          -   **dorzolamide** 2 % Drops  Directions:  1 drop ophthalmic, both eyes twice a day  (Active)          -   **fluticasone-umeclidin-vilanter**  (Trelegy Ellipta) 100 mcg-62.5 mcg-25 mcg/actuation blister with device  Directions:  2 puff by inhalation daily every morning  (Active)          -   **furosemide** 40 mg Tablet  Directions:  1 tablet oral daily  (Active)          -   **metFORMIN** 1,000 mg Tablet  Directions:  1 tablet oral twice a day  (Active)          -   **metoprolol succinate** 50 mg Tablet Extended Release 24 hr  Directions:  1.5 tablet oral daily  (Active)          -   **mirtazapine**  (Remeron) 15 mg Tablet  Directions:  1 tablet oral daily at bedtime  (Active)          -   **OLANZapine**  (ZyPREXA) 10 mg Tablet  Directions:  1 tablet oral daily at bedtime  (Active)          -   **polyethylene glycol 3350** 17 gram Powder in Packet  Directions:  1 each oral daily  (Active)          -   **QUEtiapine** 400 mg Tablet  Directions:  1 tablet oral daily at bedtime  (Active)          -   **sennosides** 8.6 mg Tablet  Directions:  2 tablet oral twice a day  (Active)      Active PRN              -   **albuterol sulfate**  (ProAir HFA) 90 mcg HFA Aerosol Inhaler   Directions:  2 puff by inhalation every four hours PRN shortness of breath  (Active)          -   **ammonium lactate**  (Lac-Hydrin Five) 5 % Lotion  Directions:  1 application topical daily PRN rash  (Active)          -   **bisacodyl** 10 mg Suppository  Directions:  1 suppository per rectum every seventy two hours PRN constipation  (Active)          -   **hydrocortisone** 2.5 % Cream  Directions:  1 application topical twice a day PRN rash  (Active)        **Exam**    Comment MS: A&Ox3 (self, city, month).    Language: Speech slowed, responds after some prompting, not speaking in full  sentences. Comprehension is intact, able to follow commands midline and  appendicular.          CN: II: VFFC    III, IV, VI: EOMI    V: intact to LT throughout    VII: face symmetric    VIII: Hearing grossly intact    XI: SCM and Trapezius 5/5    XII: Tongue midline. When remains in mouth, no stereotyped movements like  running between teeth and gums, moving side to side.          Motor: Normal bulk. Occasional catching during ROM due to tremors and  spasms.    No drift. Protrudes tongue when testing drift.          Right  Left    Delt 5/5  Delt 5/5    Br 5/5  Br 5/5    Tri 5/5  Tri 5/5    HG 4/5  HG 4/5          Ilio 5/5  Ilio 5/5    Quad 5/5  Quad 5/5    Tib Ant 5/5  Tib Ant 5/5    Gastroc 5/5  Gastroc 5/5          Sensation:    Intact to LT throughout        Reflexes:    R  L    Bi 2+  Bi 2+    Brach 2+  Brach 2+    Patellar 1+  Patellar 1+    Achilles 0  Achilles 0    .      Vital Signs    01/15/2020 14:38              -  O2 Saturation (%): 96          -  O2 Amount: 1.0 LPM          -  O2 Delivery Method: Nasal Cannula          -  MEWS Vital Sign Score: 1      01/15/2020 12:05              -  Temperature: 36.8 (35-37.8Cel)          -  Site: Oral          -  Heart Rate: 93H (60-90)          -  Site: Monitor          -  BP: 124/83 (90-140/60-90)          -  Site: Right Arm           -  Position: Lying          -  Method: Automated          -  Respirations: 18 (12-20)      01/15/2020 07:28              -  How Obtained: Bed Scale          -  Weight: 79.6kg      01/15/2020 00:22              -  Morse Fall Risk Total: 125      01/14/2020 22:04              -  Fingerstick Glucose: 231      CFS Vital Signs CS    01/15/2020 06:59              -  Shift Intake Total: 0ml          -  Shift Output Total: ~178ml          -  Shift Balance: ~-156ml        **Exam**    Comment MS: A&Ox3 (self, city, month).    Language: Speech slowed, responds after some prompting, not speaking in full  sentences. Comprehension is intact, able to follow commands midline and  appendicular.          CN: II: VFFC    III, IV, VI: EOMI    V: intact to LT throughout  VII: face symmetric    VIII: Hearing grossly intact    XI: SCM and Trapezius 5/5    XII: Tongue midline. When remains in mouth, no stereotyped movements like  running between teeth and gums, moving side to side.          Motor: Normal bulk. Occasional catching during ROM due to tremors and spasms.    No drift. Protrudes tongue when testing drift.          Right  Left    Delt 5/5  Delt 5/5    Br 5/5  Br 5/5    Tri 5/5  Tri 5/5    HG 4/5  HG 4/5          Ilio 5/5  Ilio 5/5    Quad 5/5  Quad 5/5    Tib Ant 5/5  Tib Ant 5/5    Gastroc 5/5  Gastroc 5/5          Sensation:    Intact to LT throughout        Reflexes:    R  L    Bi 2+  Bi 2+    Brach 2+  Brach 2+    Patellar 1+  Patellar 1+    Achilles 0  Achilles 0    .      Vital Signs    01/15/2020 14:38              -  O2 Saturation (%): 96          -  O2 Amount: 1.0 LPM          -  O2 Delivery Method: Nasal Cannula          -  MEWS Vital Sign Score: 1      01/15/2020 12:05              -  Temperature: 36.8 (35-37.8Cel)          -  Site: Oral          -  Heart Rate: 93H (60-90)          -  Site: Monitor          -  BP: 124/83 (90-140/60-90)          -   Site: Right Arm          -  Position: Lying          -  Method: Automated          -  Respirations: 18 (12-20)      01/15/2020 07:28              -  How Obtained: Bed Scale          -  Weight: 79.6kg      01/15/2020 00:22              -  Morse Fall Risk Total: 125      01/14/2020 22:04              -  Fingerstick Glucose: 231      CFS Vital Signs CS    01/15/2020 06:59              -  Shift Intake Total: 0ml          -  Shift Output Total: ~127ml          -  Shift Balance: ~-163ml        **Impression and Recommendations**  Medication              -   **IRON SUCROSE COMPLEX (VENOFER)** 200 MG Intravenous QDAY for 2 Doses, Clinician Dir:ON DAYS 6 AND 7.   Pending Date: 01/16/2020          -   **HYDRALAZINE (APRESOLINE)** 50 MG Oral TID First Dose Now for 60 Days, Clinician Dir:SEE ORDER #55           -   **METOPROLOL (LOPRESSOR)** 12.5 MG Oral BID First Dose Now for 60 Days           -   **REMOVE PATCH** 1 PATCH Miscellaneous QDAY for 60 Days, Clinician Dir:OLD NICOTINE PATCH           -   **PATCH INTACT QSHIFT (PATCH INTACT INSPECTION)** 1 PATCH Topical CHECK for 60 Days, Clinician UXL:[id number] (NICOTINE) PATCH EVERY SHIFT           -   **NICOTINE TRANSDERMAL (HABITROL TRANSDERMAL)** 21 MG Topical QDAY First Dose Now for 60 Days           -   **ASPIRIN CHEWABLE** 81 MG Oral QDAY for 60 Days           -   **CARBAMAZEPINE (TEGRETOL)** 400 MG Oral QAM for 60 Days           -   ** ** ** **INSULIN LISPRO (HUMALOG) (HUMALOG)******** Sliding Scale Subcutaneous TIDWM for 60 Days      For Glucose 80 to 150 Give 0 Units    For Glucose 151 to 200 Give 2 Units    For Glucose 201 to 250 Give 3 Units    For Glucose 251 to 300 Give 4 Units    Notify Physician if: Call HO for glucose > 300          -   **IPRATROPIUM 0.02% INH (ATROVENT 0.02%)** 0.25 MG Inhalation Q6H for 60 Days, Clinician VOZ:[id number] FOR TRELEGY           -   **BUDESONIDE INHALATION (PULMICORT RESPULES)** 0.25 MG  Inhalation BID for 60 Days, Clinician VZD:[id number] FOR TRELEGY SEE ORDER #31           -   **CARBAMAZEPINE (TEGRETOL)** 600 MG Oral QHS for 60 Days           -   **ATORVASTATIN (LIPITOR)** 20 MG Oral QHS for 60 Days           -   **DORZOLAMIDE 2% OPHTH (TRUSOPT 2% OPHTH)** 1 DROP Both Eyes SOL BID for 60 Days           -   **MIRTAZAPINE (REMERON)** 15 MG Oral QHS for 60 Days           -   **FORMOTEROL FUMARATE (PERFOROMIST)** 20 MCG Inhalation BID for 60 Days, Clinician JJO:[id number] FOR TRELEGY SEE ORDER #31           -   **HYDROCORTISONE 1% CREAM** 1 APP Topical BID for 60 Days, Clinician Dir:SEE EXTENDED INSTRUCTIONS INTERCHANGE FOR 2.5%           -   **QUETIAPINE FUMARATE (SEROQUEL)** 400 MG Oral QHS for 60 Days           -   **DOCUSATE SODIUM (COLACE)** 100 MG Oral BID for 60 Days           -   **OLANZAPINE (ZYPREXA)** 10 MG Oral QHS for 60 Days           -   **  SENNA (SENOKOT)** 8.6 MG Oral BID for 60 Days           -   **BRIMONIDINE 0.2% OPHTH (ALPHAGAN 0.2% OPHTH)** 1 DROP Both Eyes SOL BID for 60 Days, Clinician QIO:[id number] FOR 0.15% STRENGTH           -   **GLUCAGON** 1 MG Intramuscular PRN BG < 60 AND NO IV ACCESS for 60 Days, Clinician Dir:MAY BE GIVEN IM OR SUB-Q           -   **DEXTROSE 50% SYRINGE (DEXTROSE 50%)** 12.5 G Intravenous PRN BG < 60 AND CAN NOT TAKE PO for 60 Days           -   **BISACODYL (DULCOLAX)** 10 MG Rectal QDAYPRN CONSTIPATION for 60 Days           -   **HEPARIN** 5000 UNITS Subcutaneous Q12H for 60 Days           -   **IPRATROPIUM/ALBUTEROL SULFATE (DUONEB)** 3 ML Inhalation Q4HPRN SOB/WHEEZING for 60 Days           -   **BENZONATATE** 100 MG Oral TIDPRN COUGH for 60 Days           -   **ALBUTEROL INHALER (VENTOLIN HFA)** 2 PUFF Inhalation Q4HPRN SHORTNESS OF BREATH for 60 Days           -   **ACETAMINOPHEN (TYLENOL)** 650 MG Oral Q6HPRN PAIN for 60 Days           -   **POLYETHYLENE GLYCOL (MIRALAX)** 17 G Oral QDAYPRN CONSTIPATION  for 60 Days           -   **AMMONIUM LACTATE 12% LOTION (LAC-HYDRIN 12%)** 1 APP Topical QDAYPRN RASH for 60 Days           Neurology Assessment and Plan Comment Mr. Shallenberger is a 65M with PMH of HFrEF  (EF 30% 05/2019), COPD not on home O2, HTN, DM, and, Schizoaffective disorder,  hospitalized for HF exacerbation and has been receiving diuresis. Neurology  was consulted for rhythmic tremors/jerking in all the extremities which came  on today. His altered mental status quickly improved this morning and it is  thought that this is respiratory related, pulm is seeing the patient and  giving recs. His jerking movements are the more important neurologic question  currently. These have apparently been occuring per the patient, but it was  felt to be worsened today in the setting of his altered mental status this  morning and have been ongoing since then. He makes these movements while  sitting at rest as well as throughout actions.        Upon reexamination, patient had been placed on bipap and was sleeping. His  tremor was not present at all during sleep, except for some right wrist  flexion twitching as he was waking up. When awake, his tongue did not make  stereotypical tardive dyskinesia movements, and his tremor was not as  pronounced. He was able to tap repetitively when asked to but did not follow  the same rhythm as examiner. At baseline he seems to have a little trouble  following commands and requires some redirection, but is able to follow and is  oriented, alert, and otherwise neurologically intact. It is possible he has  baseline tremor that was unmasked by his AMS this morning, which was thought  to be respiratory in origin by the medicine team. His improvement since being  woken up (going to  CT) and being placed on bipap is consistent with this. We  think tardive dyskinesia is less likely as the movements are not quite  consistent with that, but we will re-evaluate the patient to gauge the  stability of  his symptoms. Seizure is unlikely as the patient was alert and  did not appear postictal upon evaluation, and the movements were also not  consistent with an epileptic presentation.        Recommendations:    - no acute interventions    - neurology will f/u tomorrow        Discussed with Dr. Leda Min (attending neurologist)        Andree Elk, MD    Neurology [phone]    .    **Impression and Recommendations**    Medication              -   **IRON SUCROSE COMPLEX (VENOFER)** 200 MG Intravenous QDAY for 2 Doses, Clinician Dir:ON DAYS 6 AND 7.   Pending Date: 01/16/2020          -   **HYDRALAZINE (APRESOLINE)** 50 MG Oral TID First Dose Now for 60 Days, Clinician Dir:SEE ORDER #55           -   **METOPROLOL (LOPRESSOR)** 12.5 MG Oral BID First Dose Now for 60 Days           -   **REMOVE PATCH** 1 PATCH Miscellaneous QDAY for 60 Days, Clinician Dir:OLD NICOTINE PATCH           -   **PATCH INTACT QSHIFT (PATCH INTACT INSPECTION)** 1 PATCH Topical CHECK for 60 Days, Clinician UEA:[id number] (NICOTINE) PATCH EVERY SHIFT           -   **NICOTINE TRANSDERMAL (HABITROL TRANSDERMAL)** 21 MG Topical QDAY First Dose Now for 60 Days           -   **ASPIRIN CHEWABLE** 81 MG Oral QDAY for 60 Days           -   **CARBAMAZEPINE (TEGRETOL)** 400 MG Oral QAM for 60 Days           -   ** ** ** **INSULIN LISPRO (HUMALOG) (HUMALOG)******** Sliding Scale Subcutaneous TIDWM for 60 Days      For Glucose 80 to 150 Give 0 Units    For Glucose 151 to 200 Give 2 Units    For Glucose 201 to 250 Give 3 Units    For Glucose 251 to 300 Give 4 Units    Notify Physician if: Call HO for glucose > 300          -   **IPRATROPIUM 0.02% INH (ATROVENT 0.02%)** 0.25 MG Inhalation Q6H for 60 Days, Clinician BJY:[id number] FOR TRELEGY           -   **BUDESONIDE INHALATION (PULMICORT RESPULES)** 0.25 MG Inhalation BID for 60 Days, Clinician MVH:[id number] FOR TRELEGY SEE ORDER #31           -   **CARBAMAZEPINE (TEGRETOL)** 600 MG Oral  QHS for 60 Days           -   **ATORVASTATIN (LIPITOR)** 20 MG Oral QHS for 60 Days           -   **DORZOLAMIDE 2% OPHTH (TRUSOPT 2% OPHTH)** 1 DROP Both Eyes SOL BID for 60 Days           -   **MIRTAZAPINE (REMERON)** 15 MG Oral QHS for 60 Days           -   **  FORMOTEROL FUMARATE (PERFOROMIST)** 20 MCG Inhalation BID for 60 Days, Clinician ZOX:[id number] FOR TRELEGY SEE ORDER #31           -   **HYDROCORTISONE 1% CREAM** 1 APP Topical BID for 60 Days, Clinician Dir:SEE EXTENDED INSTRUCTIONS INTERCHANGE FOR 2.5%           -   **QUETIAPINE FUMARATE (SEROQUEL)** 400 MG Oral QHS for 60 Days           -   **DOCUSATE SODIUM (COLACE)** 100 MG Oral BID for 60 Days           -   **OLANZAPINE (ZYPREXA)** 10 MG Oral QHS for 60 Days           -   **SENNA (SENOKOT)** 8.6 MG Oral BID for 60 Days           -   **BRIMONIDINE 0.2% OPHTH (ALPHAGAN 0.2% OPHTH)** 1 DROP Both Eyes SOL BID for 60 Days, Clinician XBJ:[id number] FOR 0.15% STRENGTH           -   **GLUCAGON** 1 MG Intramuscular PRN BG < 60 AND NO IV ACCESS for 60 Days, Clinician Dir:MAY BE GIVEN IM OR SUB-Q           -   **DEXTROSE 50% SYRINGE (DEXTROSE 50%)** 12.5 G Intravenous PRN BG < 60 AND CAN NOT TAKE PO for 60 Days           -   **BISACODYL (DULCOLAX)** 10 MG Rectal QDAYPRN CONSTIPATION for 60 Days           -   **HEPARIN** 5000 UNITS Subcutaneous Q12H for 60 Days           -   **IPRATROPIUM/ALBUTEROL SULFATE (DUONEB)** 3 ML Inhalation Q4HPRN SOB/WHEEZING for 60 Days           -   **BENZONATATE** 100 MG Oral TIDPRN COUGH for 60 Days           -   **ALBUTEROL INHALER (VENTOLIN HFA)** 2 PUFF Inhalation Q4HPRN SHORTNESS OF BREATH for 60 Days           -   **ACETAMINOPHEN (TYLENOL)** 650 MG Oral Q6HPRN PAIN for 60 Days           -   **POLYETHYLENE GLYCOL (MIRALAX)** 17 G Oral QDAYPRN CONSTIPATION for 60 Days           -   **AMMONIUM LACTATE 12% LOTION (LAC-HYDRIN 12%)** 1 APP Topical QDAYPRN RASH for 60 Days           Neurology  Assessment and Plan Comment Mr. Baines is a 28M with PMH of HFrEF  (EF 30% 05/2019), COPD not on home O2, HTN, DM, and, Schizoaffective disorder,  hospitalized for HF exacerbation and has been receiving diuresis. Neurology  was consulted for rhythmic tremors/jerking in all the extremities which came  on today. His altered mental status quickly improved this morning and it is  thought that this is respiratory related, pulm is seeing the patient and  giving recs. His jerking movements are the more important neurologic question  currently. These have apparently been occuring per the patient, but it was  felt to be worsened today in the setting of his altered mental status this  morning and have been ongoing since then. He makes these movements while  sitting at rest as well as throughout actions.        Upon reexamination, patient had been placed on bipap and was sleeping. His  tremor was not present at all during sleep, except for some right wrist  flexion twitching as he was waking up. When awake, his tongue did not make  stereotypical tardive dyskinesia movements, and his tremor was not as  pronounced. He was able to tap repetitively when asked to but did not follow  the same rhythm as examiner. At baseline he seems to have a little trouble  following commands and requires some redirection, but is able to follow and is  oriented, alert, and otherwise neurologically intact. It is possible he has  baseline tremor that was unmasked by his AMS this morning, which was thought  to be respiratory in origin by the medicine team. His improvement since being  woken up (going to CT) and being placed on bipap is consistent with this. We  think tardive dyskinesia is less likely as the movements are not quite  consistent with that, but we will re-evaluate the patient to gauge the  stability of his symptoms. Seizure is unlikely as the patient was alert and  did not appear postictal upon evaluation, and the movements were also  not  consistent with an epileptic presentation.        Recommendations:    - no acute interventions    - neurology will f/u tomorrow        Discussed with Dr. Leda Min (attending neurologist)        Andree Elk, MD    Neurology [phone]    .            **Electronically signed by Judie Petit.Andree Elk, MD on 01/15/2020 17:06**        **Electronically signed by Judie Petit.Andree Elk, MD on 01/15/2020 17:06**

## 2020-01-10 NOTE — Progress Notes (Signed)
 Supervisory Note For Resident I performed a history and physical examination  of the patient and discussed the management with the resident. I reviewed the  resident's note and agree with the documented findings and plan of care,  except as documented below. Yes,    Additional Notes    TTE showed a much depressed EF    He was diuresed well. Shortness of breath continued to improve    Still very overloaded in examination    Continue with aggressive IV diuresis.    Not ready to be discharged safely. .            **Electronically signed by Delora Fuel, MD on 01/12/2020 14:28**

## 2020-01-10 NOTE — Progress Notes (Signed)
 **Subjective**    Subjective Overnight:    - Did not pass voiding trial in PM and need foley to be replaced        This AM:    - Feels well and wants to go home over Rehab    --- PT re-evaluated and cleared for discharge back to group home    - Denies chest pain, nausea, vomiting, diarrhea, and headache.    Tolerating Diet Yes.    **ROS**    Complete Review of Systems All other systems reviewed and negative except as  noted in the HPI.    **Exam**    Comment Weight: 79.6 (9/6) -> 76.4 (9/7) -> 74.8 (9/9) -> 75.16 -> 75.5 kg    I/O: Net negative 400        Gen: NAD, Lying in bed    HEENT: PERRL, JVD unable to be observed    CV: RRR, no murmurs, rubs, gallops    Lung: CTAB, normal WOB    Abd: Soft, Nontender, no rebound, no guarding    Ext: 1+ bilateral LE edema, slightly worse than yesterday    Neuro: no focal deficits .      Vital Signs    01/20/2020 07:00              -  How Obtained: Stand. Scale          -  Weight: 75.5kg      01/20/2020 03:31              -  Temperature: 36.5 (35-37.8Cel)          -  Site: Oral          -  Heart Rate: 88 (60-90)          -  Site: Monitor          -  BP: 113/71 (90-140/60-90)          -  Site: Left Arm          -  Position: Sitting          -  Method: Automated          -  Respirations: 18 (12-20)          -  O2 Saturation (%): 94          -  O2 Delivery Method: Room Air          -  MEWS Vital Sign Score: 0      01/19/2020 22:35              -  Lattie Corns Fall Risk Total: 85      CFS Vital Signs CS    01/20/2020 14:59              -  Shift Intake Total: 0ml          -  Shift Output Total:          -  Shift Balance: -        **Assessment and Plan**    Medication              -   **HYDRALAZINE (APRESOLINE)** 50 MG Oral TID for 60 Days, Clinician YNW:GNFA FOR SYSTOLIC BP <90   Pending Date: 01/20/2020          -   **METOPROLOL XL (TOPROL XL)** 75 MG Oral QDAY for 60 Days           -   **LACTULOSE (CEPHULAC)**  20 G Oral BID for 60  Days           -   **IPRATROPIUM/ALBUTEROL SULFATE (DUONEB)** 3 ML Inhalation Q4H for 60 Days           -   **PREDNISONE (DELTASONE)** 40 MG Oral QDAY First Dose Now for 60 Days           -   **GUAIFENESIN SYRUP** 200 MG Oral Q4HPRN COUGH for 60 Days           -   **REMOVE PATCH** 1 PATCH Miscellaneous QDAY for 60 Days, Clinician Dir:OLD NICOTINE PATCH           -   **PATCH INTACT QSHIFT (PATCH INTACT INSPECTION)** 1 PATCH Topical CHECK for 60 Days, Clinician FAO:[id number] (NICOTINE) PATCH EVERY SHIFT           -   **NICOTINE TRANSDERMAL (HABITROL TRANSDERMAL)** 21 MG Topical QDAY First Dose Now for 60 Days           -   **ASPIRIN CHEWABLE** 81 MG Oral QDAY for 60 Days           -   **CARBAMAZEPINE (TEGRETOL)** 400 MG Oral QAM for 60 Days           -   ** ** ** **INSULIN LISPRO (HUMALOG) (HUMALOG)******** Sliding Scale Subcutaneous TIDWM for 60 Days      For Glucose 80 to 150 Give 0 Units    For Glucose 151 to 200 Give 2 Units    For Glucose 201 to 250 Give 3 Units    For Glucose 251 to 300 Give 4 Units    Notify Physician if: Call HO for glucose > 300          -   **DORZOLAMIDE 2% OPHTH (TRUSOPT 2% OPHTH)** 1 DROP Both Eyes SOL BID for 60 Days           -   **MIRTAZAPINE (REMERON)** 15 MG Oral QHS for 60 Days           -   **OLANZAPINE (ZYPREXA)** 10 MG Oral QHS for 60 Days           -   **ATORVASTATIN (LIPITOR)** 20 MG Oral QHS for 60 Days           -   **BUDESONIDE INHALATION (PULMICORT RESPULES)** 0.25 MG Inhalation BID for 60 Days, Clinician QIO:[id number] FOR TRELEGY SEE ORDER #31           -   **FORMOTEROL FUMARATE (PERFOROMIST)** 20 MCG Inhalation BID for 60 Days, Clinician GMW:[id number] FOR TRELEGY SEE ORDER #31           -   **HYDROCORTISONE 1% CREAM** 1 APP Topical BID for 60 Days, Clinician Dir:SEE EXTENDED INSTRUCTIONS INTERCHANGE FOR 2.5%           -   **SENNA (SENOKOT)** 8.6 MG Oral BID for 60 Days           -   **BRIMONIDINE 0.2% OPHTH (ALPHAGAN 0.2% OPHTH)** 1  DROP Both Eyes SOL BID for 60 Days, Clinician IHK:[id number] FOR 0.15% STRENGTH           -   **CARBAMAZEPINE (TEGRETOL)** 600 MG Oral QHS for 60 Days           -   **DOCUSATE SODIUM (COLACE)** 100 MG Oral BID for 60 Days           -   **QUETIAPINE FUMARATE (SEROQUEL)** 400 MG Oral QHS for 60  Days           -   **GLUCAGON** 1 MG Intramuscular PRN BG < 60 AND NO IV ACCESS for 60 Days, Clinician Dir:MAY BE GIVEN IM OR SUB-Q           -   **DEXTROSE 50% SYRINGE (DEXTROSE 50%)** 12.5 G Intravenous PRN BG < 60 AND CAN NOT TAKE PO for 60 Days           -   **BISACODYL (DULCOLAX)** 10 MG Rectal QDAYPRN CONSTIPATION for 60 Days           -   **HEPARIN** 5000 UNITS Subcutaneous Q12H for 60 Days           -   **IPRATROPIUM/ALBUTEROL SULFATE (DUONEB)** 3 ML Inhalation Q4HPRN SOB/WHEEZING for 60 Days           -   **BENZONATATE** 100 MG Oral TIDPRN COUGH for 60 Days           -   **ALBUTEROL INHALER (VENTOLIN HFA)** 2 PUFF Inhalation Q4HPRN SHORTNESS OF BREATH for 60 Days           -   **ACETAMINOPHEN (TYLENOL)** 650 MG Oral Q6HPRN PAIN for 60 Days           -   **AMMONIUM LACTATE 12% LOTION (LAC-HYDRIN 12%)** 1 APP Topical QDAYPRN RASH for 60 Days           -   **POLYETHYLENE GLYCOL (MIRALAX)** 17 G Oral QDAYPRN CONSTIPATION for 60 Days           Comment Arthur Young is a 54 yo male with history of HFrEF (EF 30%  05/2019), COPD not on home O2, HTN, DM, and, Schizoaffective disorder  presenting from a group home for shortness of breath.        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 History of previous admissions for CHF exacerbation requiring IV  Diuresis. Previous dry weight of 75kg with most recent weight 81kg in the ED.  Etiology of exacerbation unclear, but probable medication non-adherence given  poot historian. Spoke to patient with his daughter Donnie Aho) present  on (9/9)  regarding CHF/COPD exacerbation and possible malignancy with subsequent steps  -- voiced understanding and that she would be the most appropriate HCP.    PRELOAD: Continued fluid overloaded on exam with lower extremity edema and CXR  notable for increased pulmonary vascular congestion with left pleural effusion    - Double home lasix and give 80mg  po this AM with increase LE edema    --- Goal 0.5 - 1.0L    - Strict I&O, Daily standing weights    PUMP    Echo 09/02 with normal sized LV with normal LV thickness and severely reduced  LV systolic function (EF 20-25%), Flattened septum consistent with RV  pressure/volume overload, Severe global hypokinesis of LV; Right Ventricle  moderate to severely dilated with severely reduced function, trace MR, severe  TR    - Metroprolol 75mg  XL qd    - Repeat inpatient TTE as outpatient to re-evaluate right sided dysfunction    AFTERLOAD    - Hydralazine 50mg  TID    CORONARIES    - Continue home ASA and Atorvastatin 80mg     Neurohormonal: Previously discontinued Spironolactone for hyperkalemia in the  setting of K-sparing diuretics    -  Can consider Spironolactone for GDMT if hyperkalemia stable        # Acute COPD Exacerbation    # Pseudomonas Pneumonia    PFTS (07/21/19) FEV1 24% with FEV1/FVC 40% GOLD IV. Pulm previously wanted  outpatient CT Chest, as patient is inpatient and being actively diuresed with  changes in mental status, will go ahead and pursue inpatient CT Chest.  SSA/SSB, RF per previous Pulm note Negative. Tmax 38.0, BCx drawn (NGTD), and  SBPs in 90s. Afebrile as of (9/9) without leukocytosis.    - Sputum Cx: Pansensitive Pseudomonas    - Start Ciprofloxacin (9/11-) given sensitive on culture    --- Check EKG QTC in AM tomorrow    --- S/p Cefepime 1g Q24H (9/8 - 9/11), Vancomycin (9/7-9/9), Ceftriaxone 1g  Q24H (9/7-9/8), Azithromycin 500mg  IV Q24H (9/2-3)    - Pulmonary c/s, recs appreciated    --- Plan for prolonged prednisone taper    --- do not  anticipate RHC for this patient given poor functional status and  poor candidacy for PHTN therapies    - Prednisone 40mg  qd 5 days (9/7-)    --- S/p Prednisone 40mg  (s/p 9/1-9/5)    --- Plan to decrease by 10 mg every 7 days until off    -- Duonebs Q4H    - oxygen goal > 88%        #Pulmonay Nodules    CT Chest Impression (9/7) noted new innumerable bilateral pulmonary nodules  are concerning for metastasis. CT of the abdomen and pelvis with IV contrast  is recommended for further evaluation with a small focal lucency in the  manubrium of the sternum could represent a lytic metastasis. Given possible  metastatis, will look for primary cancer site    - PSA WNL    - Definitive biopsy would likely be a VATS wedge -- no primary tumor on CT  A/P        #AKI    Cr 1.85 on admission, baseline Cr possibly ~1.2-1.3. AKI probable cardiorenal  in the setting of CHF exacerbation, 1.67 (9/5), will decrease magnitude of  diuresis as patient is approaching a euvolemic state. Creatinine (9/5) 1.83 ->  2.02 -> 1.99 (9/6) -> 2.55 (9/7) -> 1.92 (9/9). Etiology of AKI most likely  2/2 new urinary retention as found on RUQ Ultrasound (9/7) and improvement in  kidney function with placement of foley catheter (9/8).    - Failed trial of vodi 9/10    - Plan to f/u urology as outpatient for next trial of void    - Monitor SCr        #Anemia    #Polycythemia    # Iron Deficiency Anemia    Hgb 18.2 and Hct 59.9 possibly 2/2 COPD vs possible OSA. Can consider empiric  treatment of OSA with trial of CPAP if patient desaturates overnight. Iron  studies significant for Iron Deficiency anemia Iron 47, %Fe Saturation 12,  Ferritin 68, Transferrin 270. 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    - IV Venofer iso heart failure    - Consider outpatient workup (JAK2 testing and BM)    - Lower Extremity Ultrasound negative for DVT    - Outpatient sleep study        #Cholestatic Liver Enzymes -- Elevated  Alk Phos and GGT. Unknown if patient  has Hx of cirrhosis although with normal AST/ALT, less likely, but would  prefer inpatient imaging to assess. Eyes appear slightly  icteric (9/6) PM  without significant elevation of Bilirubin.    - RUQ Ultrasound (9/6) Impression noted Massive distention of the urinary  bladder with estimated volume of 1.5 L and Unremarkable appearance of the  liver        #Constipation    Pt has hx of constipation and reports constipation on day of admission for the  past several days    - Lactulose    - Senna BID    - cont home miralax and colace        #DM    On Metformin outpatient. Episode of hypoglycemia to 36 (9/3) AM. D/Ced Lantus  and 4u Lispro    - A1c 7.5    - SSI TIDWM    - hold home metformin        Chronic:    - Tremot: Patient has baseline upper extremity tremors with home psychiatric  medications    - Schizoaffective disorder: Continue home Carbamazepine 400 mg qam and 600 mg  qhs, Mirtazapine 15 mg qhs, Zyprexa 10 mg qhs, Seroquel 300 mg qhs    - HLD: Continue home atorvastatin        Full code (confirmed)    HH Diet    SQ heparin    Daughter: Hulda Humphrey: [phone]    Social: Pt lives in a group home.            **Electronically signed by Arnetha Gula, MD on 01/20/2020 20:20**

## 2020-01-10 NOTE — Progress Notes (Signed)
 **Subjective**    Subjective No acute events overnight. This AM, the patient is without  complaints. Reports continued improvement in respiratory status. No CP.  Continues to feel improved. .    **Subjective**    Subjective No acute events overnight. This AM, the patient is without  complaints. Reports continued improvement in respiratory status. No CP.  Continues to feel improved. .    **Subjective**    Subjective No acute events overnight. This AM, the patient is without  complaints. Reports continued improvement in respiratory status. No CP.  Continues to feel improved. [name]    **Exam**    Comment Temp 38, HR 90s, BP 90s-130s/70s-80s, 2L NC with intermittent BiPAP    Weight: 79.6 (9/6) -> 76.4 (9/7) -> 74.8 (9/9)        Gen: NAD, Lying in bed    HEENT: PERRL    CV: RRR, no murmurs, rubs, gallops    Lung: mild bilateral expiratory wheezing    Abd: Soft, Nontender, no rebound, no guarding    Ext: trace bilateral LE edema    Neuro: no focal deficits .      Vital Signs    01/18/2020 11:05              -  Temperature: 37.3 (35-37.8Cel)          -  Site: Oral          -  Heart Rate: 87 (60-90)          -  Site: Monitor          -  BP: 115/75 (90-140/60-90)          -  Site: Left Arm          -  Position: Sitting          -  Method: Automated          -  Respirations: 18 (12-20)          -  O2 Saturation (%): 92          -  O2 Delivery Method: Room Air          -  MEWS Vital Sign Score: 0      01/18/2020 09:29              -  O2 Amount: 2.0 LPM          -  Morse Fall Risk Total: 100      01/18/2020 04:26              -  How Obtained: Bed Scale          -  Weight: 74.8kg      01/17/2020 11:30              -  BP #2: 91/73 (90-140/60-90)          -  Method: Automated      CFS Vital Signs CS    01/18/2020 22:59              -  Shift Intake Total:          -  Shift Output Total: 0ml          -  Shift Balance:        **Exam**    Comment Temp 38, HR 90s, BP 90s-130s/70s-80s,  2L NC with intermittent BiPAP    Weight: 79.6 (9/6) -> 76.4 (9/7) -> 74.8 (9/9)  Gen: NAD, Lying in bed    HEENT: PERRL    CV: RRR, no murmurs, rubs, gallops    Lung: mild bilateral expiratory wheezing    Abd: Soft, Nontender, no rebound, no guarding    Ext: trace bilateral LE edema    Neuro: no focal deficits .      Vital Signs    01/18/2020 11:05              -  Temperature: 37.3 (35-37.8Cel)          -  Site: Oral          -  Heart Rate: 87 (60-90)          -  Site: Monitor          -  BP: 115/75 (90-140/60-90)          -  Site: Left Arm          -  Position: Sitting          -  Method: Automated          -  Respirations: 18 (12-20)          -  O2 Saturation (%): 92          -  O2 Delivery Method: Room Air          -  MEWS Vital Sign Score: 0      01/18/2020 09:29              -  O2 Amount: 2.0 LPM          -  Morse Fall Risk Total: 100      01/18/2020 04:26              -  How Obtained: Bed Scale          -  Weight: 74.8kg      01/17/2020 11:30              -  BP #2: 91/73 (90-140/60-90)          -  Method: Automated      CFS Vital Signs CS    01/18/2020 22:59              -  Shift Intake Total:          -  Shift Output Total: 0ml          -  Shift Balance:        **Exam**    Comment Temp 38, HR 90s, BP 90s-130s/70s-80s, 2L NC with intermittent BiPAP    Weight: 79.6 (9/6) -> 76.4 (9/7) -> 74.8 (9/9)        Gen: NAD, Lying in bed    HEENT: PERRL    CV: RRR, no murmurs, rubs, gallops    Lung: mild bilateral expiratory wheezing    Abd: Soft, Nontender, no rebound, no guarding    Ext: trace bilateral LE edema    Neuro: no focal deficits .      Vital Signs    01/18/2020 11:05              -  Temperature: 37.3 (35-37.8Cel)          -  Site: Oral          -  Heart Rate: 87 (60-90)          -  Site: Monitor          -  BP: 115/75 (  90-140/60-90)          -  Site: Left Arm          -  Position: Sitting          -  Method: Automated          -   Respirations: 18 (12-20)          -  O2 Saturation (%): 92          -  O2 Delivery Method: Room Air          -  MEWS Vital Sign Score: 0      01/18/2020 09:29              -  O2 Amount: 2.0 LPM          -  Morse Fall Risk Total: 100      01/18/2020 04:26              -  How Obtained: Bed Scale          -  Weight: 74.8kg      01/17/2020 11:30              -  BP #2: 91/73 (90-140/60-90)          -  Method: Automated      CFS Vital Signs CS    01/18/2020 22:59              -  Shift Intake Total:          -  Shift Output Total: 0ml          -  Shift Balance:        **Assessment and Plan**    Medication              -   **METOPROLOL (LOPRESSOR)** 25 MG Oral BID for 60 Days   Pending Date: 01/18/2020          -   **FUROSEMIDE (LASIX)** 40 MG Oral QDAY First Dose Now for 60 Days           -   **HYDRALAZINE (APRESOLINE)** 10 MG Oral TID for 57 Days, Clinician ZOX:WRUE SBP <90S. SEE ORDER #55.           -   **CEFEPIME (MAXIPIME)** 1 G Intravenous Q8H First Dose Now for 9 Doses, Clinician Dir:HAP/VAP/HCAP PROTOCOL. ATTACH TO 100 ML NS MED PLUS. INFUSE FIRST DOSE OVER 30 MINUTES AND ALL SUBSEQUENT DOSES OVER 3 HOURS.           -   **LACTULOSE (CEPHULAC)** 20 G Oral BID for 60 Days           -   **IPRATROPIUM/ALBUTEROL SULFATE (DUONEB)** 3 ML Inhalation Q4H for 60 Days           -   **PREDNISONE (DELTASONE)** 40 MG Oral QDAY First Dose Now for 60 Days           -   **GUAIFENESIN SYRUP** 200 MG Oral Q4HPRN COUGH for 60 Days           -   **REMOVE PATCH** 1 PATCH Miscellaneous QDAY for 60 Days, Clinician Dir:OLD NICOTINE PATCH           -   **PATCH INTACT QSHIFT (PATCH INTACT INSPECTION)** 1 PATCH Topical CHECK for 60 Days, Clinician AVW:[id number] (NICOTINE) PATCH EVERY SHIFT           -   **NICOTINE TRANSDERMAL (HABITROL TRANSDERMAL)** 21 MG  Topical QDAY First Dose Now for 60 Days           -   **CARBAMAZEPINE (TEGRETOL)** 400 MG Oral QAM for 60 Days           -    **ASPIRIN CHEWABLE** 81 MG Oral QDAY for 60 Days           -   ** ** ** **INSULIN LISPRO (HUMALOG) (HUMALOG)******** Sliding Scale Subcutaneous TIDWM for 60 Days      For Glucose 80 to 150 Give 0 Units    For Glucose 151 to 200 Give 2 Units    For Glucose 201 to 250 Give 3 Units    For Glucose 251 to 300 Give 4 Units    Notify Physician if: Call HO for glucose > 300          -   **DOCUSATE SODIUM (COLACE)** 100 MG Oral BID for 60 Days           -   **MIRTAZAPINE (REMERON)** 15 MG Oral QHS for 60 Days           -   **CARBAMAZEPINE (TEGRETOL)** 600 MG Oral QHS for 60 Days           -   **FORMOTEROL FUMARATE (PERFOROMIST)** 20 MCG Inhalation BID for 60 Days, Clinician OZH:[id number] FOR TRELEGY SEE ORDER #31           -   **HYDROCORTISONE 1% CREAM** 1 APP Topical BID for 60 Days, Clinician Dir:SEE EXTENDED INSTRUCTIONS INTERCHANGE FOR 2.5%           -   **SENNA (SENOKOT)** 8.6 MG Oral BID for 60 Days           -   **BRIMONIDINE 0.2% OPHTH (ALPHAGAN 0.2% OPHTH)** 1 DROP Both Eyes SOL BID for 60 Days, Clinician XBM:[id number] FOR 0.15% STRENGTH           -   **ATORVASTATIN (LIPITOR)** 20 MG Oral QHS for 60 Days           -   **BUDESONIDE INHALATION (PULMICORT RESPULES)** 0.25 MG Inhalation BID for 60 Days, Clinician VOZ:[id number] FOR TRELEGY SEE ORDER #31           -   **QUETIAPINE FUMARATE (SEROQUEL)** 400 MG Oral QHS for 60 Days           -   **DORZOLAMIDE 2% OPHTH (TRUSOPT 2% OPHTH)** 1 DROP Both Eyes SOL BID for 60 Days           -   **OLANZAPINE (ZYPREXA)** 10 MG Oral QHS for 60 Days           -   **GLUCAGON** 1 MG Intramuscular PRN BG < 60 AND NO IV ACCESS for 60 Days, Clinician Dir:MAY BE GIVEN IM OR SUB-Q           -   **DEXTROSE 50% SYRINGE (DEXTROSE 50%)** 12.5 G Intravenous PRN BG < 60 AND CAN NOT TAKE PO for 60 Days           -   **BISACODYL (DULCOLAX)** 10 MG Rectal QDAYPRN CONSTIPATION for 60 Days           -   **HEPARIN** 5000 UNITS Subcutaneous Q12H for 60 Days            -   **IPRATROPIUM/ALBUTEROL SULFATE (DUONEB)** 3 ML Inhalation Q4HPRN SOB/WHEEZING for 60 Days           -   **POLYETHYLENE GLYCOL (MIRALAX)** 17 G  Oral QDAYPRN CONSTIPATION for 60 Days           -   **ALBUTEROL INHALER (VENTOLIN HFA)** 2 PUFF Inhalation Q4HPRN SHORTNESS OF BREATH for 60 Days           -   **ACETAMINOPHEN (TYLENOL)** 650 MG Oral Q6HPRN PAIN for 60 Days           -   **AMMONIUM LACTATE 12% LOTION (LAC-HYDRIN 12%)** 1 APP Topical QDAYPRN RASH for 60 Days           -   **BENZONATATE** 100 MG Oral TIDPRN COUGH for 60 Days           Pulmonology Assessment and Plan Comment COPD Exacerbation    Acute on Chronic Hypercapnic and Hypoxic Respiratory Failure    - afebrile and HDS overnight; 92-94% on RA this AM    - exam with improved air movement bilaterally. Trace LE edema and no JVD    - labs with normalization of electrolytes. Creatinine 1.92 from 2.60  previously    - CT abdomen with evidence for primary    - we will plan for outpatient biopsy/PET of lung lesions with primary  pulmanologist to allow for resolution of acute illness    PLAN    - continue prednisone 40 mg daily; decrease by 10 mg every 7 days until off    - continue LABA/LAMA and ICS; PRN duonebs    - we will arrange outpatient pulm for further evaluation and  biopsy/management of lung lesions in 2 weeks    - do not anticipate RHC for this patient given poor functional status and  poor candidacy for PHTN therapies    - oxygen goal > 88%; wean oxygen as tolerated    - pulmonary will sign off        Thank you for this consult. Seen and staffed with Dr. Brion Aliment. Attending  attestation to follow.        Liliana Cline    Pulmonary/Critical Care Fellow PGY-4.    **Assessment and Plan**    Medication              -   **METOPROLOL (LOPRESSOR)** 25 MG Oral BID for 60 Days   Pending Date: 01/18/2020          -   **FUROSEMIDE (LASIX)** 40 MG Oral QDAY First Dose Now for 60 Days           -   **HYDRALAZINE (APRESOLINE)** 10 MG Oral  TID for 57 Days, Clinician NFA:OZHY SBP <90S. SEE ORDER #55.           -   **CEFEPIME (MAXIPIME)** 1 G Intravenous Q8H First Dose Now for 9 Doses, Clinician Dir:HAP/VAP/HCAP PROTOCOL. ATTACH TO 100 ML NS MED PLUS. INFUSE FIRST DOSE OVER 30 MINUTES AND ALL SUBSEQUENT DOSES OVER 3 HOURS.           -   **LACTULOSE (CEPHULAC)** 20 G Oral BID for 60 Days           -   **IPRATROPIUM/ALBUTEROL SULFATE (DUONEB)** 3 ML Inhalation Q4H for 60 Days           -   **PREDNISONE (DELTASONE)** 40 MG Oral QDAY First Dose Now for 60 Days           -   **GUAIFENESIN SYRUP** 200 MG Oral Q4HPRN COUGH for 60 Days           -   **REMOVE PATCH** 1 PATCH Miscellaneous QDAY for  60 Days, Clinician Dir:OLD NICOTINE PATCH           -   **PATCH INTACT QSHIFT (PATCH INTACT INSPECTION)** 1 PATCH Topical CHECK for 60 Days, Clinician AOZ:[id number] (NICOTINE) PATCH EVERY SHIFT           -   **NICOTINE TRANSDERMAL (HABITROL TRANSDERMAL)** 21 MG Topical QDAY First Dose Now for 60 Days           -   **CARBAMAZEPINE (TEGRETOL)** 400 MG Oral QAM for 60 Days           -   **ASPIRIN CHEWABLE** 81 MG Oral QDAY for 60 Days           -   ** ** ** **INSULIN LISPRO (HUMALOG) (HUMALOG)******** Sliding Scale Subcutaneous TIDWM for 60 Days      For Glucose 80 to 150 Give 0 Units    For Glucose 151 to 200 Give 2 Units    For Glucose 201 to 250 Give 3 Units    For Glucose 251 to 300 Give 4 Units    Notify Physician if: Call HO for glucose > 300          -   **DOCUSATE SODIUM (COLACE)** 100 MG Oral BID for 60 Days           -   **MIRTAZAPINE (REMERON)** 15 MG Oral QHS for 60 Days           -   **CARBAMAZEPINE (TEGRETOL)** 600 MG Oral QHS for 60 Days           -   **FORMOTEROL FUMARATE (PERFOROMIST)** 20 MCG Inhalation BID for 60 Days, Clinician ION:[id number] FOR TRELEGY SEE ORDER #31           -   **HYDROCORTISONE 1% CREAM** 1 APP Topical BID for 60 Days, Clinician Dir:SEE EXTENDED INSTRUCTIONS INTERCHANGE FOR 2.5%           -    **SENNA (SENOKOT)** 8.6 MG Oral BID for 60 Days           -   **BRIMONIDINE 0.2% OPHTH (ALPHAGAN 0.2% OPHTH)** 1 DROP Both Eyes SOL BID for 60 Days, Clinician MWN:[id number] FOR 0.15% STRENGTH           -   **ATORVASTATIN (LIPITOR)** 20 MG Oral QHS for 60 Days           -   **BUDESONIDE INHALATION (PULMICORT RESPULES)** 0.25 MG Inhalation BID for 60 Days, Clinician HKV:[id number] FOR TRELEGY SEE ORDER #31           -   **QUETIAPINE FUMARATE (SEROQUEL)** 400 MG Oral QHS for 60 Days           -   **DORZOLAMIDE 2% OPHTH (TRUSOPT 2% OPHTH)** 1 DROP Both Eyes SOL BID for 60 Days           -   **OLANZAPINE (ZYPREXA)** 10 MG Oral QHS for 60 Days           -   **GLUCAGON** 1 MG Intramuscular PRN BG < 60 AND NO IV ACCESS for 60 Days, Clinician Dir:MAY BE GIVEN IM OR SUB-Q           -   **DEXTROSE 50% SYRINGE (DEXTROSE 50%)** 12.5 G Intravenous PRN BG < 60 AND CAN NOT TAKE PO for 60 Days           -   **BISACODYL (DULCOLAX)** 10 MG Rectal QDAYPRN CONSTIPATION for 60 Days           -   **  HEPARIN** 5000 UNITS Subcutaneous Q12H for 60 Days           -   **IPRATROPIUM/ALBUTEROL SULFATE (DUONEB)** 3 ML Inhalation Q4HPRN SOB/WHEEZING for 60 Days           -   **POLYETHYLENE GLYCOL (MIRALAX)** 17 G Oral QDAYPRN CONSTIPATION for 60 Days           -   **ALBUTEROL INHALER (VENTOLIN HFA)** 2 PUFF Inhalation Q4HPRN SHORTNESS OF BREATH for 60 Days           -   **ACETAMINOPHEN (TYLENOL)** 650 MG Oral Q6HPRN PAIN for 60 Days           -   **AMMONIUM LACTATE 12% LOTION (LAC-HYDRIN 12%)** 1 APP Topical QDAYPRN RASH for 60 Days           -   **BENZONATATE** 100 MG Oral TIDPRN COUGH for 60 Days           Pulmonology Assessment and Plan Comment COPD Exacerbation    Acute on Chronic Hypercapnic and Hypoxic Respiratory Failure    - afebrile and HDS overnight; 92-94% on RA this AM    - exam with improved air movement bilaterally. Trace LE edema and no JVD    - labs with normalization of electrolytes. Creatinine  1.92 from 2.60  previously    - CT abdomen with evidence for primary    - we will plan for outpatient biopsy/PET of lung lesions with primary  pulmanologist to allow for resolution of acute illness    PLAN    - continue prednisone 40 mg daily; decrease by 10 mg every 7 days until off    - continue LABA/LAMA and ICS; PRN duonebs    - we will arrange outpatient pulm for further evaluation and  biopsy/management of lung lesions in 2 weeks    - do not anticipate RHC for this patient given poor functional status and  poor candidacy for PHTN therapies    - oxygen goal > 88%; wean oxygen as tolerated    - pulmonary will sign off        Thank you for this consult. Seen and staffed with Dr. Brion Aliment. Attending  attestation to follow.        Liliana Cline    Pulmonary/Critical Care Fellow PGY-4.    **Assessment and Plan**    Medication              -   **METOPROLOL (LOPRESSOR)** 25 MG Oral BID for 60 Days   Pending Date: 01/18/2020          -   **FUROSEMIDE (LASIX)** 40 MG Oral QDAY First Dose Now for 60 Days           -   **HYDRALAZINE (APRESOLINE)** 10 MG Oral TID for 57 Days, Clinician JYN:[id number] SBP <90S. SEE ORDER #55.           -   **CEFEPIME (MAXIPIME)** 1 G Intravenous Q8H First Dose Now for 9 Doses, Clinician Dir:HAP/VAP/HCAP PROTOCOL. ATTACH TO 100 ML NS MED PLUS. INFUSE FIRST DOSE OVER 30 MINUTES AND ALL SUBSEQUENT DOSES OVER 3 HOURS.           -   **LACTULOSE (CEPHULAC)** 20 G Oral BID for 60 Days           -   **IPRATROPIUM/ALBUTEROL SULFATE (DUONEB)** 3 ML Inhalation Q4H for 60 Days           -   **PREDNISONE (DELTASONE)** 40 MG Oral  QDAY First Dose Now for 60 Days           -   **GUAIFENESIN SYRUP** 200 MG Oral Q4HPRN COUGH for 60 Days           -   **REMOVE PATCH** 1 PATCH Miscellaneous QDAY for 60 Days, Clinician Dir:OLD NICOTINE PATCH           -   **PATCH INTACT QSHIFT (PATCH INTACT INSPECTION)** 1 PATCH Topical CHECK for 60 Days, Clinician ZOX:[id number] (NICOTINE) PATCH EVERY SHIFT            -   **NICOTINE TRANSDERMAL (HABITROL TRANSDERMAL)** 21 MG Topical QDAY First Dose Now for 60 Days           -   **CARBAMAZEPINE (TEGRETOL)** 400 MG Oral QAM for 60 Days           -   **ASPIRIN CHEWABLE** 81 MG Oral QDAY for 60 Days           -   ** ** ** **INSULIN LISPRO (HUMALOG) (HUMALOG)******** Sliding Scale Subcutaneous TIDWM for 60 Days      For Glucose 80 to 150 Give 0 Units    For Glucose 151 to 200 Give 2 Units    For Glucose 201 to 250 Give 3 Units    For Glucose 251 to 300 Give 4 Units    Notify Physician if: Call HO for glucose > 300          -   **DOCUSATE SODIUM (COLACE)** 100 MG Oral BID for 60 Days           -   **MIRTAZAPINE (REMERON)** 15 MG Oral QHS for 60 Days           -   **CARBAMAZEPINE (TEGRETOL)** 600 MG Oral QHS for 60 Days           -   **FORMOTEROL FUMARATE (PERFOROMIST)** 20 MCG Inhalation BID for 60 Days, Clinician UJW:[id number] FOR TRELEGY SEE ORDER #31           -   **HYDROCORTISONE 1% CREAM** 1 APP Topical BID for 60 Days, Clinician Dir:SEE EXTENDED INSTRUCTIONS INTERCHANGE FOR 2.5%           -   **SENNA (SENOKOT)** 8.6 MG Oral BID for 60 Days           -   **BRIMONIDINE 0.2% OPHTH (ALPHAGAN 0.2% OPHTH)** 1 DROP Both Eyes SOL BID for 60 Days, Clinician OZH:[id number] FOR 0.15% STRENGTH           -   **ATORVASTATIN (LIPITOR)** 20 MG Oral QHS for 60 Days           -   **BUDESONIDE INHALATION (PULMICORT RESPULES)** 0.25 MG Inhalation BID for 60 Days, Clinician XBM:[id number] FOR TRELEGY SEE ORDER #31           -   **QUETIAPINE FUMARATE (SEROQUEL)** 400 MG Oral QHS for 60 Days           -   **DORZOLAMIDE 2% OPHTH (TRUSOPT 2% OPHTH)** 1 DROP Both Eyes SOL BID for 60 Days           -   **OLANZAPINE (ZYPREXA)** 10 MG Oral QHS for 60 Days           -   **GLUCAGON** 1 MG Intramuscular PRN BG < 60 AND NO IV ACCESS for 60 Days, Clinician Dir:MAY BE GIVEN IM OR SUB-Q           -   **  DEXTROSE 50% SYRINGE (DEXTROSE 50%)** 12.5 G Intravenous PRN BG < 60 AND  CAN NOT TAKE PO for 60 Days           -   **BISACODYL (DULCOLAX)** 10 MG Rectal QDAYPRN CONSTIPATION for 60 Days           -   **HEPARIN** 5000 UNITS Subcutaneous Q12H for 60 Days           -   **IPRATROPIUM/ALBUTEROL SULFATE (DUONEB)** 3 ML Inhalation Q4HPRN SOB/WHEEZING for 60 Days           -   **POLYETHYLENE GLYCOL (MIRALAX)** 17 G Oral QDAYPRN CONSTIPATION for 60 Days           -   **ALBUTEROL INHALER (VENTOLIN HFA)** 2 PUFF Inhalation Q4HPRN SHORTNESS OF BREATH for 60 Days           -   **ACETAMINOPHEN (TYLENOL)** 650 MG Oral Q6HPRN PAIN for 60 Days           -   **AMMONIUM LACTATE 12% LOTION (LAC-HYDRIN 12%)** 1 APP Topical QDAYPRN RASH for 60 Days           -   **BENZONATATE** 100 MG Oral TIDPRN COUGH for 60 Days           Pulmonology Assessment and Plan Comment COPD Exacerbation    Acute on Chronic Hypercapnic and Hypoxic Respiratory Failure    - afebrile and HDS overnight; 92-94% on RA this AM    - exam with improved air movement bilaterally. Trace LE edema and no JVD    - labs with normalization of electrolytes. Creatinine 1.92 from 2.60  previously    - CT abdomen with evidence for primary    - we will plan for outpatient biopsy/PET of lung lesions with primary  pulmanologist to allow for resolution of acute illness    PLAN    - continue prednisone 40 mg daily; decrease by 10 mg every 7 days until off    - continue LABA/LAMA and ICS; PRN duonebs    - we will arrange outpatient pulm for further evaluation and  biopsy/management of lung lesions in 2 weeks    - do not anticipate RHC for this patient given poor functional status and  poor candidacy for PHTN therapies    - oxygen goal > 88%; wean oxygen as tolerated    - pulmonary will sign off        Thank you for this consult. Seen and staffed with Dr. Brion Aliment. Attending  attestation to follow.        Liliana Cline    Pulmonary/Critical Care Fellow PGY-4.            **Electronically signed by Rebeca Allegra, MD on 01/18/2020 17:53**         **Electronically signed by Rebeca Allegra, MD on 01/18/2020 17:53**        **Electronically signed by Rebeca Allegra, MD on 01/18/2020 17:53**

## 2020-01-10 NOTE — ED Provider Notes (Signed)
 .  .  Name: Arthur Young, Arthur Young  MRN: [id number]  Age: 54 yrs  Sex: Male  DOB: 06/29/1965  Arrival Date: 01/10/2020  Arrival Time: 15:51  Account#: [id number]  Bed Pending Adult  PCP: Berenice Primas  Chief Complaint: SOB, Edema, Constipation  .  Presentation:  09/01  15:53 Presenting complaint: EMS states: Pt c/o increasing SOB x18mo    dn8        and bilateral lower extremity edema. pt also states he has been        unable to go to the BR. pt 90% on RA, placed on 2L NC,        congested cough hx COPD,CHF no 02 at home.  15:53 Method Of Arrival: EMS: [facility]                              dn8  15:53 Acuity: Adult 2                                                 dn8  .  Historical:  - Allergies:  15:55 Depakote;                                                       dn8  15:55 Lithium Carbonate;                                              dn8  - PMHx:  15:55 COPD; Hypertension; CHF; Diabetes-NIDDM;                        dn8  .  - Social history: Smoking status: Patient uses tobacco    products, current every day smoker. Patient/guardian denies    using alcohol.  .  .  Screening:  15:59 SEPSIS SCREENING SIRS Criteria (> = 2) No. Safety screen:       dn8        Patient feels safe. Suicide (ED Safe) Screening: In the past        two weeks have you felt down, depressed or hopelessquestion No.        Exposure Risk/Travel Screening: COVID 19 Vaccinequestion Yes-patient        states they completed COVID vaccine recommendations over 2        weeks ago.  .  Vital Signs:  15:55 BP 133 / 84; Pulse 88; Resp 24; Temp 36.2; Pulse Ox 98% on 2    dn8        lpm NC; Weight 81.65 kg (R);  16:19 BP 121 / 87; Pulse 93; Resp 20; Pulse Ox 97% on 2 lpm NC;       kd18  17:57 BP 117 / 88; Pulse 89; Resp 24; Pulse Ox 95% ;                  kd18  20:17 BP 119 / 85; Pulse 84 Monitor; Resp 24 Spontaneous; Temp        mc44  36.5(O); Pulse Ox 95% on 2 lpm NC;  .  Glasgow Coma Score:  15:55 Eye Response: spontaneous(4). Verbal Response: oriented(5).      dn8        Motor Response: obeys commands(6). Total: 15.  16:19 Eye Response: spontaneous(4). Verbal Response: oriented(5).     kd18        Motor Response: obeys commands(6). Total: 15.  .  Name:Arthur Young, Arthur Young  [id number]  [id number]  Page 1 of 4  %%PAGE  .  Name: Arthur Young, Arthur Young  MRN: [id number]  Age: 53 yrs  Sex: Male  DOB: [birth year]  Arrival Date: 01/10/2020  Arrival Time: 15:51  Account#: [id number]  Bed Pending Adult  PCP: Berenice Primas  Chief Complaint: SOB, Edema, Constipation  .  20:19 Eye Response: spontaneous(4). Verbal Response: oriented(5).     mc44        Motor Response: obeys commands(6). Total: 15.  .  Triage Assessment:  15:59 General: Appears in no apparent distress, comfortable, Behavior dn8        is cooperative. Respiratory: Reports shortness of breath cough        that is.  .  Assessment:  16:19 Reassessment: Patient complains of constipation for the past    kd18        3-4 days. Patient had BM today but reports it was "Arthur" and the        patient had to strain. Patient denies N/V/D or abdominal pain.        Patient also reports fatigue and shortness of breath upon        ambulation. Patient has edema in bilateral lower extremities        along with red scaly rash bilaterally. Patient denies pain or        itching in lower exremities. General: Appears well developed,        Behavior is cooperative, Reports fatigue for >3 days, Denies        fever, chills. Pain: Denies pain. Neuro: Patient denies        Headache, Eye opening: Spontaneously Level on consciousness:        Sustained Attention Motor Response: Obeys Commands.        Cardiovascular: Bilateral lower extremity edema.        Cardiovascular: Reports fatigue, Chest pain is described as        vague. Respiratory: Airway is patent Respiratory effort is        even, Respiratory pattern is symmetrical, Breath sounds with        wheezes bilaterally. Reports shortness of breath on exertion        cough that is non-productive. GI: Abdomen is  distended, Bowel        sounds present X 4 quads. Abd is rigid X 4 quads. Reports        constipation, Denies cramping, diarrhea, nausea, pain. GU:.        Skin: Skin Arthur, red, scaling rash on bilateral lower        extremities.  19:03 Reassessment: Assumed care of pt. Pt resting in stretcher,      mc44        waiting inpt bed assignment.  19:13 Reassessment: admitting team at bedside.                        mc44  20:19 General: Appears in no apparent distress, Behavior is           mc44  appropriate for age, cooperative. Pain: Denies pain. Neuro: Eye        opening: Spontaneously Level on consciousness: Sustained        Attention Verbal Response: Orientation: Oriented x 3 Speech:        Clear. Respiratory: Airway is patent Respiratory pattern is        symmetrical, tachypnea O2 Sat continuous monitoring.  .  Observations:  15:51 Patient arrived in ED.                                          dn8  .  Name:Arthur Young, Arthur Young  [id number]  [id number]  Page 2 of 4  %%PAGE  .  Name: Arthur Young, Arthur Young  MRN: [id number]  Age: 47 yrs  Sex: Male  DOB: 1965/08/09  Arrival Date: 01/10/2020  Arrival Time: 15:51  Account#: [id number]  Bed Pending Adult  PCP: Berenice Primas  Chief Complaint: SOB, Edema, Constipation  .  15:55 Triage Completed.                                               dn8  16:01 Registration completed.                                         dw5  16:01 Patient Visited By: Fredric Dine                                dw5  16:14 Patient Visited By: Marga Melnick                             vj  18:36 Patient assigned to A11                                         kb30  20:15 Patient assigned to A11                                         lr15  .  Procedure:  16:19 Inserted peripheral IV: 18 gauge in left antecubital area. O2   kd18        via nasal cannula @ 2L/min.  17:56 Whole Blood Potassium Sent.                                     kd18  18:43 COVID-19 PCR (Symptomatic patients) Sent.                        kd18  .  Dispensed Medications:  16:54 Not Given (aersolized procedure. spoke to MD): DuoNeb 3 mL      tr11        Inhalation once  17:04 Drug: Albuterol HFA Inhaler 2 puffs Route: Inhalation;  kd18  17:51 Drug: Lasix - Furosemide 40 mg Route: IVP;                      kd18  .  [name]  Interventions:  16:19 Demo Sheet Scanned into Chart                                   dw  16:19 Armband on Placed in gown Call light in reach Bed in low        kd18        position.  16:26 EMS Sheet Scanned into Chart                                    dw  18:19 ECG/EKG Scanned into Chart                                      dw  18:57 Meds List Scanned into Chart                                    dw  19:09 Armband on Placed in gown Call light in reach Bed in low        mc44        position Side rail up X1. Report given to [place], Charity fundraiser. Verbal        reassurance given. Warm blanket given. Pillow given.  20:06 Patient Belongings Scanned into Chart                           jm78  20:40 Report given to [facility] 8 Charity fundraiser.                                     mc36  20:52 Outside Patient Records Scanned into Chart                      dw  .  Outcome:  18:34 Decision to Hospitalize by Provider.                            vj  20:20 Admitted to [place] 8 accompanied by tech, via stretcher, with    mc44        oxygen, with chart. Condition: stable. Discharge Assessment:        Patient awake, alert and oriented x 3. No cognitive and/or        functional deficits noted. Patient verbalized understanding of        disposition instructions. Chart Status Nursing note complete        and electronically signed.  .  Name:Arthur Young, Arthur Young  [id number]  [id number]  Page 3 of 4  %%PAGE  .  Name: Arthur Young, Arthur Young  MRN: [id number]  Age: 35 yrs  Sex: Male  DOB: Dec 20, 1965  Arrival Date: 01/10/2020  Arrival Time: 15:51  Account#: [id number]  Bed Pending Adult  PCP: Berenice Primas  Chief Complaint: SOB, Edema, Constipation  .  20:47 Patient left the ED.  lr15  .  Corrections: (The following items were deleted from the chart)  16:29 16:19 Cardiovascular: Reports fatigue, Chest pain is denied [id number]  16:29 16:19 Skin: Skin Arthur, red, scaling rash on bilateral lower      kd18        extremities kd18  .  Signatures:  [place] Charon, [place]                                  dw  Fredric Dine                            Reg  dw5  Forbes Cellar                        RN   mc44  Donnamarie Poag                           RN   [address]                          kb30  Marga Melnick                         MD   vj  Nicolosi, Domenic                       RN   dn8  Lutricia Horsfall                                  kd18  Gaylyn Lambert                           Sec  jm78  Rosaria Ferries                          RN   [phone]  .  .  .  .  .  .  .  .  .  .  .  .  .  .  .  .  .  .  .  .  .  .  .  .  .  Name:Arthur Young, Arthur Young  [id number]  [id number]  Page 4 of 4  .  %%END

## 2020-01-10 NOTE — H&P (Signed)
 **COVID-19 Positive/Negative**    COVID-19 Positive or Negative? COVID non-suspect (test negative or pending).    **COVID-19 Positive/Negative**    COVID-19 Positive or Negative? COVID non-suspect (test negative or pending).    **Chief Complaint / HPI**    Chief Complaint Shortness of Breath.    HPI Patient is a 54 y/o male with history of HFrEF (EF 30% 05/2019), COPD not  on home O2, HTN, DM, and, Schizoaffective disorder presenting from a group  home for shortness of breath. Patient states he has been experiencing dyspnea  on exertion, lower extremity swelling, constipation, and intermittent chest  pain for the past several weeks worsened with exertion. He endorses associated  cough with white and foamy sputum without fevers and chills. He notes that it  has been increasingly difficult to climb up the stairs to his bedroom on the  second floor at his group home where he has previously not had issues climbing  the stairs. He has noticed increased painful swelling of his lower extremities  with an increase in pigment without calor. He endorses constipation for the  past several days, but currently denies nausea, vomiting, diarrhea, headaches,  and vision changes. At times the patient is a poor historian and is not  familiar with his past medical history, medications, and allergies.        In the Emergency Department, on arrival the patient was saturating 98% on 2L  NC, BPs 130s/80s, rates in the 80s-90s, afebrile, and tachypneic. His weight  is 81kg with most recent dry weight of 75kg on previous discharge. He was  given 40mg  IV lasix and Albuterol inhaler in the ED with some alleviation of  his symptoms. .    **Chief Complaint / HPI**    Chief Complaint Shortness of Breath.    HPI Patient is a 54 y/o male with history of HFrEF (EF 30% 05/2019), COPD not  on home O2, HTN, DM, and, Schizoaffective disorder presenting from a group  home for shortness of breath. Patient states he has been experiencing dyspnea  on  exertion, lower extremity swelling, constipation, and intermittent chest  pain for the past several weeks worsened with exertion. He endorses associated  cough with white and foamy sputum without fevers and chills. He notes that it  has been increasingly difficult to climb up the stairs to his bedroom on the  second floor at his group home where he has previously not had issues climbing  the stairs. He has noticed increased painful swelling of his lower extremities  with an increase in pigment without calor. He endorses constipation for the  past several days, but currently denies nausea, vomiting, diarrhea, headaches,  and vision changes. At times the patient is a poor historian and is not  familiar with his past medical history, medications, and allergies.        In the Emergency Department, on arrival the patient was saturating 98% on 2L  NC, BPs 130s/80s, rates in the 80s-90s, afebrile, and tachypneic. His weight  is 81kg with most recent dry weight of 75kg on previous discharge. He was  given 40mg  IV lasix and Albuterol inhaler in the ED with some alleviation of  his symptoms. .    **Allergies**              -  Depakote          -  Lithium          Allergy List Was Reviewed Yes.    **Allergies**              -  Depakote          -  Lithium          Allergy List Was Reviewed Yes.    **Home Medications**    Home Medication Reconciliation/List Was Reviewed Yes.    Current Medication Orders Reviewed Yes.    **Home Medications**    Home Medication Reconciliation/List Was Reviewed Yes.    Current Medication Orders Reviewed Yes.    **History**    Significant Med Hx As Listed.    Smoking Status Current Every Day Smoker.    Alcohol Use Former Use.    Illicit Drug Use Denies.    Family History was/is Reviewed and is Non-Contributory to the Patient's  Illness.    **History**    Significant Med Hx As Listed.    Smoking Status Current Every Day Smoker.    Alcohol Use Former Use.    Illicit Drug Use Denies.    Family  History was/is Reviewed and is Non-Contributory to the Patient's  Illness.    **ROS**    Complete Review of Systems All other systems reviewed and negative except as  noted in the HPI.    **ROS**    Complete Review of Systems All other systems reviewed and negative except as  noted in the HPI.    **Exam**    Comment Gen: NAD    HEENT: MMM, PERRL, EOMI    CV: RRR, no murmurs, rubs, gallops    Lung: overall diminished; +Bilateral Diffuse Inspiratory and Expiratory  wheezing    Abd: Soft, Nontender, no rebound, no guarding +Distended, +BS    Ext: WWP +Bilateral lower extremity edema to the shins    Neuro: no focal neurologic deficits; AOx3.    I have reviewed and agree with vital signs as listed in the EMR Yes Time  01/10/2020 20:29.    **Exam**    Comment Gen: NAD    HEENT: MMM, PERRL, EOMI    CV: RRR, no murmurs, rubs, gallops    Lung: overall diminished; +Bilateral Diffuse Inspiratory and Expiratory  wheezing    Abd: Soft, Nontender, no rebound, no guarding +Distended, +BS    Ext: WWP +Bilateral lower extremity edema to the shins    Neuro: no focal neurologic deficits; AOx3.    I have reviewed and agree with vital signs as listed in the EMR Yes Time  01/10/2020 20:29.    **Labs**    Labs Reviewed for this Visit Yes.    **Labs**    Labs Reviewed for this Visit Yes.    **Radiology**    All current radiographs have been reviewed by myself as of 01/10/2020 20:29.    **Radiology**    All current radiographs have been reviewed by myself as of 01/10/2020 20:29.    **Assessment and Plan**    Comment Patient is a 54 y/o male with history of HFrEF (EF 30% 05/2019), COPD  not on home O2, HTN, DM, and, Schizoaffective disorder presenting from a group  home for shortness of breath.        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 History of previous admissions for CHF  exacerbation requiring IV Diuresis.  Previous dry weight of 75kg with most  recent weight 81kg in the ED.    --- PRELOAD: Fluid overloaded on exam with lower extremity edema and CXR  notable for increased pulmonary  vascular congestion with left pleural effusion    -- 40mg  IV Lasix BID    -- strict I&O    -- daily standing weights        --PUMP-- HFrEF (LVEF 30%)    -- Inpatient TTE        --- CORONARIES--    -- ASA and Atorvastatin 20mg         --- Neurohormonal ---    -- Can consider Spironolactone for GDMT        #Acute COPD Exacerbation - PFTS (07/21/19) FEV1 24% with FEV1/FVC 40% GOLD IV.    -- Prednisone 40mg  qd 5 days    -- Ciprofloxacin    -- SSA/SSB, RF, Anti-CCP serologies per previous Pulm note    -- Duonebs Q4HPRN        #AKI    Cr 1.85 on admission, baseline Cr possibly ~1.2-1.3, but unknown if new Cr  baseline is 1.85. AKI possibly due to cardiorenal in the setting of CHF  exacerbation.    - Diuresis as above            #Polycythemia    Hgb 18.2 and Hct 59.9 possibly 2/2 COPD vs possible OSA. Can consider empiric  treatment of OSA with trial of CPAP if patient desaturates overnight    - Consider outpatient workup (JAK2 testing and BM)    - EPO level    - outpatient sleep study            #Constipation    Pt has hx of constipation and reports constipation on day of admission for the  past several days    - Senna BID    - cont home miralax and colace        #DM    On Metformin outpatient.    - A1c Pending    - 10u Lantus and 4u Lispro    - SSI TIDWM    - hold home metformin        #Schizoaffective disorder    - Carbamazepine 400 mg qam and 600 mg qhs    - Mirtazapine 15 mg qhs    - Zyprexa 10 mg qhs    - Seroquel 300 mg qhs        #HTN    #HLD: Cont Home Atorvastatin        #Social: Pt lives in a group home        Full code (confirmed)    HH Diet    SQ heparin.    **Assessment and Plan**    Comment Patient is a 54 y/o male with history of HFrEF (EF 30% 05/2019), COPD  not on home O2, HTN, DM, and, Schizoaffective disorder presenting from a  group  home for shortness of breath.        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 History of previous admissions for CHF  exacerbation requiring IV Diuresis. Previous dry weight of 75kg with most  recent weight 81kg in the ED.    --- PRELOAD: Fluid overloaded on exam with lower extremity edema and CXR  notable for increased pulmonary vascular congestion with left pleural effusion    -- 40mg  IV Lasix BID    -- strict I&O    -- daily standing weights        --PUMP-- HFrEF (LVEF 30%)    --  Inpatient TTE        --- CORONARIES--    -- ASA and Atorvastatin 20mg         --- Neurohormonal ---    -- Can consider Spironolactone for GDMT        #Acute COPD Exacerbation - PFTS (07/21/19) FEV1 24% with FEV1/FVC 40% GOLD IV.    -- Prednisone 40mg  qd 5 days    -- Ciprofloxacin    -- SSA/SSB, RF, Anti-CCP serologies per previous Pulm note    -- Duonebs Q4HPRN        #AKI    Cr 1.85 on admission, baseline Cr possibly ~1.2-1.3, but unknown if new Cr  baseline is 1.85. AKI possibly due to cardiorenal in the setting of CHF  exacerbation.    - Diuresis as above            #Polycythemia    Hgb 18.2 and Hct 59.9 possibly 2/2 COPD vs possible OSA. Can consider empiric  treatment of OSA with trial of CPAP if patient desaturates overnight    - Consider outpatient workup (JAK2 testing and BM)    - EPO level    - outpatient sleep study            #Constipation    Pt has hx of constipation and reports constipation on day of admission for the  past several days    - Senna BID    - cont home miralax and colace        #DM    On Metformin outpatient.    - A1c Pending    - 10u Lantus and 4u Lispro    - SSI TIDWM    - hold home metformin        #Schizoaffective disorder    - Carbamazepine 400 mg qam and 600 mg qhs    - Mirtazapine 15 mg qhs    - Zyprexa 10 mg qhs    - Seroquel 300 mg qhs        #HTN    #HLD: Cont Home  Atorvastatin        #Social: Pt lives in a group home        Full code (confirmed)    HH Diet    SQ heparin.            **Electronically signed by Laurelyn Sickle, MD on 01/10/2020 22:12**        **Electronically signed by Laurelyn Sickle, MD on 01/10/2020 22:12**

## 2020-01-10 NOTE — Consults (Signed)
 **Note**    **Pulmonary Consult H &P**        CC: Hypercapnia, concern for pulmonary hypertension        HPI: [name] is a 54 y/o male with history of HFrEF (EF 30% 05/2019), COPD  not on home O2, HTN, DM, and, Schizoaffective disorder who presented on 5/1  from a group home for dyspnea on exertion, lower extremity swelling,  constipation, and intermittent chest pain for the past several weeks worsened  with exertion. Was also having productive cough. He was also noted to be  volume overloaded on exam, with weight significantly above dry weight. Started  on diuresis. Also started on prednisone, cipro, and nebs for presumed COPD  exacerbation. TTE was obtained which showed biventricular failure, severe  global hypokinesis, RV dilation, suspected pulmonary hypertension.        Patient's bicarb noted to be uptrending, so was given a dose of Diamox last  night. This morning, patient became altered and somnolent. ABG showed  7.32/80/63/40. Pulmonology consulted for assistance with further workup and  management of hypercapnia and concern for PH. Evaluated patient in the  afternoon, and he was significantly improved as compared to his earlier mental  status. Denied SOB, says he is feeling well.        ROS:  Comprehensive review of systems was performed and negative except as  otherwise stated in HPI.        PMH/Surg Hx: See above    SH: Lives in group home, active smoker    FMH: Unable to obtain        Home Meds: Reviewed, notable for ProAir, Lasix, benzonatate    Allergies: Depakote, lithium        Inpatient Meds: Reviewed, notable for budesonide, formoterol, ipratropium, PRN  albuterol. Diamox x1. Completed pred, azithro        Physical Exam:    Vitals: Reviewed as charted    General: NAD    HEENT: Moist oral mucosa, no lesions    CV: RRR. No murmurs/rubs/gallops.    Resp: Lungs CTA bilaterally. No increased WOB    GI: NBS. Soft, nontender, slightly distended    MSK/Ext: No joint deformities. 1+ peripheral  edema    Skin: No rashes or lesions    Neuro: Alert, answering questions        Labs: Reviewed, notable for Cr 1.99, CO2 38, Cl 92    Blood gas: 7.32/80/63/40        Imaging:    CXR 9/6 with pulm edema, no consolidation        PFTS (07/21/19) FEV1 24% with FEV1/FVC 40% GOLD IV.        **Assessment & Plan:**    [name] is a 54 y/o male with history of HFrEF (EF 30% 05/2019), COPD not  on home O2, HTN, DM, and, Schizoaffective disorder who presented on 5/1 from a  group home for dyspnea on exertion, lower extremity swelling, constipation,  and intermittent chest pain. Being treated for CHF exacerbation with  biventricular failure and LVEF 30% with diuresis. Also treated for COPD  exacerbation. Hospital course complicated by altered mental status on 9/6 with  respiratory acidosis on ABG. Pulmonology consulted for assistance with further  workup and management of hypercapnia and concern for PH.        Of note, patient did receive Diamox last night. Would not recommend use of  this agent in patients with very severe COPD as they may be unable to maintain  appropriate respiratory  compensation and develop acute respiratory acidosis.  Rather, would correct his elevated bicarb by holding diuretics temporarily and  aggressively repleting chloride. Can replete this as sodium chloride (would  avoid given volume overload) or as potassium chloride, by repleting K to goal  of 4.5-5 until chloride begins to normalize.        Agree with BiPAP as tolerated for now, although patient's mental status  already improving and he denies resp symptoms. Agree with current inhaler  regimen of LABA, LAMA, ICS, PRN albuterol. Patient already completed 5 days of  prednisone and seems improved at present.        Regarding concern for pulmonary hypertension, he does have multiple risk  factors including biventricular heart failure and very severe COPD. Would  repeat TTE once he is approaching euvolemia to get a more accurate estimation  of his  RVSP.        Problem List:    COPD exacerbation    Acute on chronic hypercapnic respiratory failure    Elevated bicarb        Recommendations:    - Would NOT re-administer Diamox for this patient    - Would hold diuresis for now and replete chloride to correct elevated bicarb    - Agree with current regimen of LABA/LAMA/ICS and PRN albuterol    - Agree with BiPAP as tolerated    - Would repeat TTE once euvolemic to reevaluate PH        Thank you for the consult. Pt seen and discussed with attending Dr. Gwynneth Albright,  addendum to follow. Plan discussed with primary team. Please page with  questions.        Ewing Schlein, Pulmonary/Critical Care Fellow    Pager [phone]        **Note**    **Pulmonary Consult H &P**        CC: Hypercapnia, concern for pulmonary hypertension        HPI: [name] is a 54 y/o male with history of HFrEF (EF 30% 05/2019), COPD  not on home O2, HTN, DM, and, Schizoaffective disorder who presented on 5/1  from a group home for dyspnea on exertion, lower extremity swelling,  constipation, and intermittent chest pain for the past several weeks worsened  with exertion. Was also having productive cough. He was also noted to be  volume overloaded on exam, with weight significantly above dry weight. Started  on diuresis. Also started on prednisone, cipro, and nebs for presumed COPD  exacerbation. TTE was obtained which showed biventricular failure, severe  global hypokinesis, RV dilation, suspected pulmonary hypertension.        Patient's bicarb noted to be uptrending, so was given a dose of Diamox last  night. This morning, patient became altered and somnolent. ABG showed  7.32/80/63/40. Pulmonology consulted for assistance with further workup and  management of hypercapnia and concern for PH. Evaluated patient in the  afternoon, and he was significantly improved as compared to his earlier mental  status. Denied SOB, says he is feeling well.        ROS:  Comprehensive review of systems was performed and  negative except as  otherwise stated in HPI.        PMH/Surg Hx: See above    SH: Lives in group home, active smoker    FMH: Unable to obtain        Home Meds: Reviewed, notable for ProAir, Lasix, benzonatate    Allergies: Depakote, lithium  Inpatient Meds: Reviewed, notable for budesonide, formoterol, ipratropium, PRN  albuterol. Diamox x1. Completed pred, azithro        Physical Exam:    Vitals: Reviewed as charted    General: NAD    HEENT: Moist oral mucosa, no lesions    CV: RRR. No murmurs/rubs/gallops.    Resp: Lungs CTA bilaterally. No increased WOB    GI: NBS. Soft, nontender, slightly distended    MSK/Ext: No joint deformities. 1+ peripheral edema    Skin: No rashes or lesions    Neuro: Alert, answering questions        Labs: Reviewed, notable for Cr 1.99, CO2 38, Cl 92    Blood gas: 7.32/80/63/40        Imaging:    CXR 9/6 with pulm edema, no consolidation        PFTS (07/21/19) FEV1 24% with FEV1/FVC 40% GOLD IV.        **Assessment & Plan:**    [name] is a 53 y/o male with history of HFrEF (EF 30% 05/2019), COPD not  on home O2, HTN, DM, and, Schizoaffective disorder who presented on 5/1 from a  group home for dyspnea on exertion, lower extremity swelling, constipation,  and intermittent chest pain. Being treated for CHF exacerbation with  biventricular failure and LVEF 30% with diuresis. Also treated for COPD  exacerbation. Hospital course complicated by altered mental status on 9/6 with  respiratory acidosis on ABG. Pulmonology consulted for assistance with further  workup and management of hypercapnia and concern for PH.        Of note, patient did receive Diamox last night. Would not recommend use of  this agent in patients with very severe COPD as they may be unable to maintain  appropriate respiratory compensation and develop acute respiratory acidosis.  Rather, would correct his elevated bicarb by holding diuretics temporarily and  aggressively repleting chloride. Can replete this as  sodium chloride (would  avoid given volume overload) or as potassium chloride, by repleting K to goal  of 4.5-5 until chloride begins to normalize.        Agree with BiPAP as tolerated for now, although patient's mental status  already improving and he denies resp symptoms. Agree with current inhaler  regimen of LABA, LAMA, ICS, PRN albuterol. Patient already completed 5 days of  prednisone and seems improved at present.        Regarding concern for pulmonary hypertension, he does have multiple risk  factors including biventricular heart failure and very severe COPD. Would  repeat TTE once he is approaching euvolemia to get a more accurate estimation  of his RVSP.        Problem List:    COPD exacerbation    Acute on chronic hypercapnic respiratory failure    Elevated bicarb        Recommendations:    - Would NOT re-administer Diamox for this patient    - Would hold diuresis for now and replete chloride to correct elevated bicarb    - Agree with current regimen of LABA/LAMA/ICS and PRN albuterol    - Agree with BiPAP as tolerated    - Would repeat TTE once euvolemic to reevaluate PH        Thank you for the consult. Pt seen and discussed with attending Dr. Gwynneth Albright,  addendum to follow. Plan discussed with primary team. Please page with  questions.        Ewing Schlein, Pulmonary/Critical Care Fellow    Pager #  [phone]        **Note**    **Pulmonary Consult H &P**        CC: Hypercapnia, concern for pulmonary hypertension        HPI: [name] is a 54 y/o male with history of HFrEF (EF 30% 05/2019), COPD  not on home O2, HTN, DM, and, Schizoaffective disorder who presented on 5/1  from a group home for dyspnea on exertion, lower extremity swelling,  constipation, and intermittent chest pain for the past several weeks worsened  with exertion. Was also having productive cough. He was also noted to be  volume overloaded on exam, with weight significantly above dry weight. Started  on diuresis. Also started on prednisone,  cipro, and nebs for presumed COPD  exacerbation. TTE was obtained which showed biventricular failure, severe  global hypokinesis, RV dilation, suspected pulmonary hypertension.        Patient's bicarb noted to be uptrending, so was given a dose of Diamox last  night. This morning, patient became altered and somnolent. ABG showed  7.32/80/63/40. Pulmonology consulted for assistance with further workup and  management of hypercapnia and concern for PH. Evaluated patient in the  afternoon, and he was significantly improved as compared to his earlier mental  status. Denied SOB, says he is feeling well.        ROS:  Comprehensive review of systems was performed and negative except as  otherwise stated in HPI.        PMH/Surg Hx: See above    SH: Lives in group home, active smoker    FMH: Unable to obtain        Home Meds: Reviewed, notable for ProAir, Lasix, benzonatate    Allergies: Depakote, lithium        Inpatient Meds: Reviewed, notable for budesonide, formoterol, ipratropium, PRN  albuterol. Diamox x1. Completed pred, azithro        Physical Exam:    Vitals: Reviewed as charted    General: NAD    HEENT: Moist oral mucosa, no lesions    CV: RRR. No murmurs/rubs/gallops.    Resp: Lungs CTA bilaterally. No increased WOB    GI: NBS. Soft, nontender, slightly distended    MSK/Ext: No joint deformities. 1+ peripheral edema    Skin: No rashes or lesions    Neuro: Alert, answering questions        Labs: Reviewed, notable for Cr 1.99, CO2 38, Cl 92    Blood gas: 7.32/80/63/40        Imaging:    CXR 9/6 with pulm edema, no consolidation        PFTS (07/21/19) FEV1 24% with FEV1/FVC 40% GOLD IV.        **Assessment & Plan:**    [name] is a 54 y/o male with history of HFrEF (EF 30% 05/2019), COPD not  on home O2, HTN, DM, and, Schizoaffective disorder who presented on 5/1 from a  group home for dyspnea on exertion, lower extremity swelling, constipation,  and intermittent chest pain. Being treated for CHF exacerbation  with  biventricular failure and LVEF 30% with diuresis. Also treated for COPD  exacerbation. Hospital course complicated by altered mental status on 9/6 with  respiratory acidosis on ABG. Pulmonology consulted for assistance with further  workup and management of hypercapnia and concern for PH.        Of note, patient did receive Diamox last night. Would not recommend use of  this agent in patients with very severe COPD as  they may be unable to maintain  appropriate respiratory compensation and develop acute respiratory acidosis.  Rather, would correct his elevated bicarb by holding diuretics temporarily and  aggressively repleting chloride. Can replete this as sodium chloride (would  avoid given volume overload) or as potassium chloride, by repleting K to goal  of 4.5-5 until chloride begins to normalize.        Agree with BiPAP as tolerated for now, although patient's mental status  already improving and he denies resp symptoms. Agree with current inhaler  regimen of LABA, LAMA, ICS, PRN albuterol. Patient already completed 5 days of  prednisone and seems improved at present.        Regarding concern for pulmonary hypertension, he does have multiple risk  factors including biventricular heart failure and very severe COPD. Would  repeat TTE once he is approaching euvolemia to get a more accurate estimation  of his RVSP.        Problem List:    COPD exacerbation    Acute on chronic hypercapnic respiratory failure    Elevated bicarb        Recommendations:    - Would NOT re-administer Diamox for this patient    - Would hold diuresis for now and replete chloride to correct elevated bicarb    - Agree with current regimen of LABA/LAMA/ICS and PRN albuterol    - Agree with BiPAP as tolerated    - Would repeat TTE once euvolemic to reevaluate PH        Thank you for the consult. Pt seen and discussed with attending Dr. Gwynneth Albright,  addendum to follow. Plan discussed with primary team. Please page with  questions.        Ewing Schlein, Pulmonary/Critical Care Fellow    Pager [phone]                **Electronically signed by Ewing Schlein, MD on 01/15/2020 19:22**        **Electronically signed by Ewing Schlein, MD on 01/15/2020 19:22**        **Electronically signed by Ewing Schlein, MD on 01/15/2020 19:22**

## 2020-01-10 NOTE — Progress Notes (Signed)
 **Note**    **Pharmacy - Medication History and Reconciliation**        I reviewed the available medical records, studied information provided by  [facility], and interviewed the patient and their caregiver to conduct a  medication history . I reconciled the patient's inpatient drug regimen with  their home medication list. I communicated significant findings directly with  a member of the admitting team and updated the Home Medications List in  [facility] as necessary.        _Impression and Recommendations_ : The patient lives in a group home and is  not a reliable historian. He is _fully COVID vaccinated_ [employer]) and  allergies listed to Depakote and lithium with unknown reactions. The patient's  medication are managed by the staff at his group home [facility]  [phone]) who faxed a med list.  Please note the following:              -  has been trialed on losartan in the past but was stopped for hyperkalemia           -  strong bowel regimen: BID senna and Colace, daily miralax, and PRN Bisacodyl suppositories - increased senna to _2 tabs_ BID on home med list           -  in 09/2019 his quetiapine was increased from 300mg  QHS to 400mg  QHS but the remainer of his medications have been unchanged for many, many months           Thank you for allowing me to participate in the care of this patient. Please  contact me or any member of the pharmacist staff if you have questions.        Ulla Gallo PharmD, RPh    Inpatient Clinical Pharmacist pg. [phone]        **Orders (Pharmacy)**    Last Updated: 01/10/2020 20:23  (In Progress)  By: Eula Fried, RPH    Active              -   **aspirin**  (Aspirin Low Dose) 81 mg tablet,delayed release (DR/EC)  Directions:  1 tablet oral daily  (Active)          -   **atorvastatin**  (Lipitor) 20 mg Tablet  Directions:  1 tablet oral daily at bedtime  (Active)          -   **brimonidine**  (Alphagan P) 0.15 % Drops  Directions:  1 drop ophthalmic twice a  day  (Active)          -   **carBAMazepine**  (Epitol) 200 mg Tablet  Directions:  2 tablet oral daily every morning  (Active)          -   **carBAMazepine**  (Epitol) 200 mg Tablet  Directions:  3 tablet oral daily every evening  (Active)          -   **chlorthalidone** 25 mg Tablet  Directions:  1 tablet oral daily  (Active)          -   **docusate sodium**  (Stool Softener) 100 mg Capsule  Directions:  1 capsule oral twice a day  (Active)          -   **dorzolamide** 2 % Drops  Directions:  1 drop ophthalmic, both eyes twice a day  (Active)          -   **fluticasone-umeclidin-vilanter**  (Trelegy Ellipta) 100 mcg-62.5 mcg-25 mcg/actuation blister with device  Directions:  2 puff by inhalation daily every morning  (Active)          -   **furosemide** 40 mg Tablet  Directions:  1 tablet oral daily  (Active)          -   **metFORMIN** 1,000 mg Tablet  Directions:  1 tablet oral twice a day  (Active)          -   **metoprolol succinate** 50 mg Tablet Extended Release 24 hr  Directions:  1.5 tablet oral daily  (Active)          -   **mirtazapine**  (Remeron) 15 mg Tablet  Directions:  1 tablet oral daily at bedtime  (Active)          -   **OLANZapine**  (ZyPREXA) 10 mg Tablet  Directions:  1 tablet oral daily at bedtime  (Active)          -   **polyethylene glycol 3350** 17 gram Powder in Packet  Directions:  1 each oral daily  (Active)          -   **QUEtiapine** 400 mg Tablet  Directions:  1 tablet oral daily at bedtime  (Active)          -   **sennosides** 8.6 mg Tablet  Directions:  2 tablet oral twice a day  (Active)      Active PRN              -   **albuterol sulfate**  (ProAir HFA) 90 mcg HFA Aerosol Inhaler  Directions:  2 puff by inhalation every four hours PRN shortness of breath  (Active)          -   **ammonium lactate**  (Lac-Hydrin Five) 5 % Lotion  Directions:  1 application topical daily PRN rash  (Active)          -   **bisacodyl** 10 mg Suppository  Directions:  1  suppository per rectum every seventy two hours PRN constipation  (Active)          -   **hydrocortisone** 2.5 % Cream  Directions:  1 application topical twice a day PRN rash  (Active)            Medication              -   **IRON SUCROSE COMPLEX (VENOFER)** 200 MG Intravenous QDAY for 2 Doses, Clinician Dir:ON DAYS 6 AND 7.   Pending Date: 01/16/2020          -   **HYDRALAZINE (APRESOLINE)** 10 MG Oral TID for 60 Days           -   **REMOVE PATCH** 1 PATCH Miscellaneous QDAY for 60 Days, Clinician Dir:OLD NICOTINE PATCH           -   **KCL SR (POTASSIUM CHLORIDE SR)** 20 MEQ Oral ONE TIME for 1 Doses           -   **FUROSEMIDE (LASIX)** 120 MG Intravenous ONE TIME for 1 Doses           -   **AZITHROMYCIN IV (ZITHROMAX IV)** 500 MG Intravenous Q24H First Dose Now for 2 Doses, Clinician Dir:X2 DOSES.           -   **IRON SUCROSE COMPLEX (VENOFER)** 200 MG Intravenous QDAY First Dose Now for 3 Doses, Clinician Dir:ON DAYS 1, 2 AND 3.           -   **PATCH INTACT QSHIFT (PATCH  INTACT INSPECTION)** 1 PATCH Topical CHECK for 60 Days, Clinician XNA:[id number] (NICOTINE) PATCH EVERY SHIFT           -   **NICOTINE TRANSDERMAL (HABITROL TRANSDERMAL)** 21 MG Topical QDAY First Dose Now for 60 Days           -   **ASPIRIN CHEWABLE** 81 MG Oral QDAY for 60 Days           -   **CARBAMAZEPINE (TEGRETOL)** 400 MG Oral QAM for 60 Days           -   **INSULIN LISPRO (HUMALOG) (HUMALOG)** 4 UNITS Subcutaneous TIDWM for 60 Days           -   ** ** ** **INSULIN LISPRO (HUMALOG) (HUMALOG)******** Sliding Scale Subcutaneous TIDWM for 60 Days      For Glucose 80 to 150 Give 0 Units    For Glucose 151 to 200 Give 2 Units    For Glucose 201 to 250 Give 3 Units    For Glucose 251 to 300 Give 4 Units    Notify Physician if: Call HO for glucose > 300          -   **IPRATROPIUM 0.02% INH (ATROVENT 0.02%)** 0.25 MG Inhalation Q6H for 60 Days, Clinician GUR:[id number] FOR TRELEGY           -   **OLANZAPINE  (ZYPREXA)** 10 MG Oral QHS for 60 Days           -   **QUETIAPINE FUMARATE (SEROQUEL)** 400 MG Oral QHS for 60 Days           -   **SENNA (SENOKOT)** 8.6 MG Oral BID for 60 Days           -   **MIRTAZAPINE (REMERON)** 15 MG Oral QHS for 60 Days           -   **BRIMONIDINE 0.2% OPHTH (ALPHAGAN 0.2% OPHTH)** 1 DROP Both Eyes SOL BID for 60 Days, Clinician BTD:[id number] FOR 0.15% STRENGTH           -   **HYDROCORTISONE 1% CREAM** 1 APP Topical BID for 60 Days, Clinician Dir:SEE EXTENDED INSTRUCTIONS INTERCHANGE FOR 2.5%           -   **ATORVASTATIN (LIPITOR)** 20 MG Oral QHS for 60 Days           -   **FORMOTEROL FUMARATE (PERFOROMIST)** 20 MCG Inhalation BID for 60 Days, Clinician GYI:[id number] FOR TRELEGY SEE ORDER #31           -   **BUDESONIDE INHALATION (PULMICORT RESPULES)** 0.25 MG Inhalation BID for 60 Days, Clinician KKX:[id number] FOR TRELEGY SEE ORDER #31           -   **CARBAMAZEPINE (TEGRETOL)** 600 MG Oral QHS for 60 Days           -   **DOCUSATE SODIUM (COLACE)** 100 MG Oral BID for 60 Days           -   **DORZOLAMIDE 2% OPHTH (TRUSOPT 2% OPHTH)** 1 DROP Both Eyes SOL BID for 60 Days           -   **GLUCAGON** 1 MG Intramuscular PRN BG < 60 AND NO IV ACCESS for 60 Days, Clinician Dir:MAY BE GIVEN IM OR SUB-Q           -   **DEXTROSE 50% SYRINGE (DEXTROSE 50%)** 12.5 G Intravenous PRN BG < 60 AND CAN NOT TAKE PO for 60 Days           -   **  BISACODYL (DULCOLAX)** 10 MG Rectal QDAYPRN CONSTIPATION for 60 Days           -   **HEPARIN** 5000 UNITS Subcutaneous Q12H for 60 Days           -   **IPRATROPIUM/ALBUTEROL SULFATE (DUONEB)** 3 ML Inhalation Q4HPRN SOB/WHEEZING for 60 Days           -   **BENZONATATE** 100 MG Oral TIDPRN COUGH for 60 Days           -   **POLYETHYLENE GLYCOL (MIRALAX)** 17 G Oral QDAYPRN CONSTIPATION for 60 Days           -   **ALBUTEROL INHALER (VENTOLIN HFA)** 2 PUFF Inhalation Q4HPRN SHORTNESS OF BREATH for 60 Days           -    **ACETAMINOPHEN (TYLENOL)** 650 MG Oral Q6HPRN PAIN for 60 Days           -   **AMMONIUM LACTATE 12% LOTION (LAC-HYDRIN 12%)** 1 APP Topical QDAYPRN RASH for 60 Days                 **Electronically signed by Eula Fried, RPH on 01/12/2020 13:54**

## 2020-01-11 LAB — HX CHEM-OTHER
HX % IRON SATURATION: 12 % — ABNORMAL LOW (ref 25–72)
HX ALBUMIN: 3.2 g/dL — ABNORMAL LOW (ref 3.4–4.8)
HX CALCIUM (CA): 8.5 mg/dL (ref 8.5–10.5)
HX CALCIUM (CA): 8.6 mg/dL (ref 8.5–10.5)
HX FERRITIN: 68 ng/mL (ref 22–277)
HX IRON: 47 ug/dL — ABNORMAL LOW (ref 49–181)
HX MAGNESIUM: 2.1 mg/dL (ref 1.6–2.6)
HX MAGNESIUM: 2.2 mg/dL (ref 1.6–2.6)
HX PHOSPHORUS: 6.4 mg/dL — ABNORMAL HIGH (ref 2.7–4.5)
HX PHOSPHORUS: 6.9 mg/dL — ABNORMAL HIGH (ref 2.7–4.5)
HX PROTEIN, TOTAL: 6.5 g/dL (ref 6.0–8.3)
HX TOTAL IRON BINDING CAPACITY: 378 ug/dL (ref 253–463)
HX TRANSFERRIN: 270 mg/dL (ref 181–331)

## 2020-01-11 LAB — HX HEM-ROUTINE
HX BASO #: 0 10*3/uL (ref 0.0–0.2)
HX BASO: 1 %
HX EOSIN #: 0 10*3/uL (ref 0.0–0.5)
HX EOSIN: 0 %
HX HCT: 57.1 % — ABNORMAL HIGH (ref 37.0–47.0)
HX HGB: 17.5 g/dL — ABNORMAL HIGH (ref 13.5–16.0)
HX IMMATURE GRANULOCYTE#: 0.1 10*3/uL (ref 0.0–0.1)
HX IMMATURE GRANULOCYTE: 1 %
HX LYMPH #: 0.7 10*3/uL — ABNORMAL LOW (ref 1.0–4.0)
HX LYMPH: 11 %
HX MCH: 32 pg (ref 26.0–34.0)
HX MCHC: 30.6 g/dL — ABNORMAL LOW (ref 32.0–36.0)
HX MCV: 104.4 fL — ABNORMAL HIGH (ref 80.0–98.0)
HX MONO #: 0.3 10*3/uL (ref 0.2–0.8)
HX MONO: 5 %
HX MPV: 10.6 fL (ref 9.1–11.7)
HX NEUT #: 5.3 10*3/uL (ref 1.5–7.5)
HX NRBC #: 0 10*3/uL
HX NUCLEATED RBC: 0 %
HX PLT: 164 10*3/uL (ref 150–400)
HX RBC BLOOD COUNT: 5.47 M/uL (ref 4.20–5.50)
HX RDW: 15.2 % — ABNORMAL HIGH (ref 11.5–14.5)
HX SEG NEUT: 83 %
HX WBC: 6.4 10*3/uL (ref 4.0–11.0)

## 2020-01-11 LAB — HX TOXICOLOGY-DRUG, SERUM
HX ACETAMINOPHEN: <3 ug/mL — ABNORMAL LOW (ref 10–20)
HX BARBITUATES QL, SERUM: NOT DETECTED
HX BENZODIAZEPINES QL, SERUM: NOT DETECTED
HX ETHANOL: <10 mg/dL
HX SALICYLATE: <5 mg/dL — ABNORMAL LOW (ref 15.0–29.9)
HX TCA: NOT DETECTED

## 2020-01-11 LAB — HX CHEM-LIPIDS
HX CHOL-HDL RATIO: 3.6
HX CHOLESTEROL: 160 mg/dL (ref 110–199)
HX HIGH DENSITY LIPOPROTEIN CHOL (HDL): 45 mg/dL (ref 35–75)
HX HOURS FAST: 8 h
HX LDL: 94 mg/dL (ref 0–129)
HX TRIGLYCERIDES: 106 mg/dL (ref 40–250)

## 2020-01-11 LAB — HX CHEM-PANELS
HX ANION GAP: 14 (ref 3–14)
HX ANION GAP: 10 (ref 3–14)
HX BLOOD UREA NITROGEN: 36 mg/dL — ABNORMAL HIGH (ref 6–24)
HX BLOOD UREA NITROGEN: 42 mg/dL — ABNORMAL HIGH (ref 6–24)
HX CHLORIDE (CL): 95 meq/L — ABNORMAL LOW (ref 98–110)
HX CHLORIDE (CL): 95 meq/L — ABNORMAL LOW (ref 98–110)
HX CO2: 28 meq/L (ref 20–30)
HX CO2: 34 meq/L — ABNORMAL HIGH (ref 20–30)
HX CREATININE (CR): 2.04 mg/dL — ABNORMAL HIGH (ref 0.57–1.30)
HX CREATININE (CR): 2.1 mg/dL — ABNORMAL HIGH (ref 0.57–1.30)
HX GFR, AFRICAN AMERICAN: 41 mL/min/{1.73_m2} — ABNORMAL LOW
HX GFR, AFRICAN AMERICAN: 40 mL/min/{1.73_m2} — ABNORMAL LOW
HX GFR, NON-AFRICAN AMERICAN: 35 mL/min/{1.73_m2} — ABNORMAL LOW
HX GFR, NON-AFRICAN AMERICAN: 34 mL/min/{1.73_m2} — ABNORMAL LOW
HX GLUCOSE: 142 mg/dL — ABNORMAL HIGH (ref 70–139)
HX GLUCOSE: 108 mg/dL (ref 70–139)
HX POTASSIUM (K): 4.9 meq/L (ref 3.6–5.1)
HX POTASSIUM (K): 4.6 meq/L (ref 3.6–5.1)
HX SODIUM (NA): 137 meq/L (ref 135–145)
HX SODIUM (NA): 139 meq/L (ref 135–145)

## 2020-01-11 LAB — HX BF-URINALYSIS
HX HYALINE CAST: 5.6 /LPF
HX KETONES: NEGATIVE mg/dL
HX LEUKOCYTE ES: NEGATIVE
HX NITRITE LEVEL: NEGATIVE
HX RED BLOOD CELLS: 6 /HPF — AB
HX SPECIFIC GRAVITY: 1.011
HX SQUAMOUS EPITHELIAL CELLS: NEGATIVE /HPF
HX U BACTERIA: NEGATIVE
HX U BILIRUBIN: NEGATIVE
HX U GLUCOSE: NEGATIVE mg/dL
HX U PH: 5
HX U UROBILINIG: 0.2 EU
HX WHITE BLOOD CELLS: <1 /HPF

## 2020-01-11 LAB — HX DIABETES
HX GLUCOSE: 142 mg/dL — ABNORMAL HIGH (ref 70–139)
HX GLUCOSE: 108 mg/dL (ref 70–139)
HX HEMOGLOBIN A1C: 7.5 % — ABNORMAL HIGH

## 2020-01-11 LAB — HX CHEM-METABOLIC
HX FOLATE, SERUM: 15.9 ng/mL (ref >=4.0)
HX HEMOGLOBIN A1C: 7.5 % — ABNORMAL HIGH
HX THYROID STIMULATING HORMONE (TSH): 1.43 u[IU]/mL (ref 0.35–4.94)

## 2020-01-11 LAB — HX CHOLESTEROL
HX CHOLESTEROL: 160 mg/dL (ref 110–199)
HX HIGH DENSITY LIPOPROTEIN CHOL (HDL): 45 mg/dL (ref 35–75)
HX LDL: 94 mg/dL (ref 0–129)
HX TRIGLYCERIDES: 106 mg/dL (ref 40–250)

## 2020-01-11 LAB — HX POINT OF CARE
HX GLUCOSE-POCT: 121 mg/dL (ref 70–139)
HX GLUCOSE-POCT: 103 mg/dL (ref 70–139)
HX GLUCOSE-POCT: 94 mg/dL (ref 70–139)
HX GLUCOSE-POCT: 79 mg/dL (ref 70–139)

## 2020-01-11 LAB — HX CHEM-LFT
HX ALANINE AMINOTRANSFERASE (ALT/SGPT): 17 IU/L (ref 0–54)
HX ALKALINE PHOSPHATASE (ALK): 295 IU/L — ABNORMAL HIGH (ref 40–130)
HX ASPARTATE AMINOTRANFERASE (AST/SGOT): 25 IU/L (ref 10–42)
HX BILIRUBIN, DIRECT: 0.4 mg/dL (ref 0.0–0.5)
HX BILIRUBIN, TOTAL: 0.7 mg/dL (ref 0.2–1.1)

## 2020-01-11 LAB — HX CHEM-ENZ-FRAC: HX TROPONIN I: 0.03 ng/mL (ref 0.00–0.03)

## 2020-01-11 LAB — HX CHEM-VITAMIN: HX VITAMIN B12: 668 pg/mL (ref 211–911)

## 2020-01-12 LAB — HX HEM-ROUTINE
HX BASO #: 0 10*3/uL (ref 0.0–0.2)
HX BASO: 1 %
HX EOSIN #: 0 10*3/uL (ref 0.0–0.5)
HX EOSIN: 1 %
HX HCT: 55.2 % — ABNORMAL HIGH (ref 37.0–47.0)
HX HGB: 16.9 g/dL — ABNORMAL HIGH (ref 13.5–16.0)
HX IMMATURE GRANULOCYTE#: 0.1 10*3/uL (ref 0.0–0.1)
HX IMMATURE GRANULOCYTE: 1 %
HX LYMPH #: 1.2 10*3/uL (ref 1.0–4.0)
HX LYMPH: 16 %
HX MCH: 32.1 pg (ref 26.0–34.0)
HX MCHC: 30.6 g/dL — ABNORMAL LOW (ref 32.0–36.0)
HX MCV: 104.9 fL — ABNORMAL HIGH (ref 80.0–98.0)
HX MONO #: 0.8 10*3/uL (ref 0.2–0.8)
HX MONO: 11 %
HX MPV: 10.2 fL (ref 9.1–11.7)
HX NEUT #: 5.5 10*3/uL (ref 1.5–7.5)
HX NRBC #: 0 10*3/uL
HX NUCLEATED RBC: 0 %
HX PLT: 167 10*3/uL (ref 150–400)
HX RBC BLOOD COUNT: 5.26 M/uL (ref 4.20–5.50)
HX RDW: 15.4 % — ABNORMAL HIGH (ref 11.5–14.5)
HX SEG NEUT: 71 %
HX WBC: 7.8 10*3/uL (ref 4.0–11.0)

## 2020-01-12 LAB — HX TOXICOLOGY-DRUG,URINE
HX AMPHETAMINE: NOT DETECTED
HX BARBITUATES: NOT DETECTED
HX BENZODIAZEPINE: NOT DETECTED
HX BUPRENORPHINE, URINE: NOT DETECTED
HX CANNABINOIDS: NOT DETECTED
HX COCAINE: NOT DETECTED
HX FENTANYL, URINE: NOT DETECTED
HX METHADONE: NOT DETECTED
HX OPIATES: NOT DETECTED
HX OXYCODONE: NOT DETECTED
HX U ETHANOL: NOT DETECTED

## 2020-01-12 LAB — HX CHEM-PANELS
HX ANION GAP: 15 — ABNORMAL HIGH (ref 3–14)
HX ANION GAP: 13 (ref 3–14)
HX BLOOD UREA NITROGEN: 41 mg/dL — ABNORMAL HIGH (ref 6–24)
HX BLOOD UREA NITROGEN: 36 mg/dL — ABNORMAL HIGH (ref 6–24)
HX CHLORIDE (CL): 93 meq/L — ABNORMAL LOW (ref 98–110)
HX CHLORIDE (CL): 92 meq/L — ABNORMAL LOW (ref 98–110)
HX CO2: 33 meq/L — ABNORMAL HIGH (ref 20–30)
HX CO2: 38 meq/L — ABNORMAL HIGH (ref 20–30)
HX CREATININE (CR): 2.04 mg/dL — ABNORMAL HIGH (ref 0.57–1.30)
HX CREATININE (CR): 1.86 mg/dL — ABNORMAL HIGH (ref 0.57–1.30)
HX GFR, AFRICAN AMERICAN: 41 mL/min/{1.73_m2} — ABNORMAL LOW
HX GFR, AFRICAN AMERICAN: 46 mL/min/{1.73_m2} — ABNORMAL LOW
HX GFR, NON-AFRICAN AMERICAN: 35 mL/min/{1.73_m2} — ABNORMAL LOW
HX GFR, NON-AFRICAN AMERICAN: 40 mL/min/{1.73_m2} — ABNORMAL LOW
HX GLUCOSE: 36 mg/dL — AB (ref 70–139)
HX GLUCOSE: 170 mg/dL — ABNORMAL HIGH (ref 70–139)
HX LACTATE: 1.9 meq/L (ref 0.5–2.2)
HX POTASSIUM (K): 3.5 meq/L — ABNORMAL LOW (ref 3.6–5.1)
HX POTASSIUM (K): 3.9 meq/L (ref 3.6–5.1)
HX SODIUM (NA): 141 meq/L (ref 135–145)
HX SODIUM (NA): 143 meq/L (ref 135–145)

## 2020-01-12 LAB — HX CHEM-OTHER
HX CALCIUM (CA): 8.8 mg/dL (ref 8.5–10.5)
HX CALCIUM (CA): 8.8 mg/dL (ref 8.5–10.5)
HX MAGNESIUM: 2.2 mg/dL (ref 1.6–2.6)
HX MAGNESIUM: 2 mg/dL (ref 1.6–2.6)
HX PHOSPHORUS: 6.1 mg/dL — ABNORMAL HIGH (ref 2.7–4.5)
HX PHOSPHORUS: 5.1 mg/dL — ABNORMAL HIGH (ref 2.7–4.5)

## 2020-01-12 LAB — HX POINT OF CARE
HX GLUCOSE-POCT: 36 mg/dL — AB (ref 70–139)
HX GLUCOSE-POCT: 82 mg/dL (ref 70–139)
HX GLUCOSE-POCT: 93 mg/dL (ref 70–139)
HX GLUCOSE-POCT: 143 mg/dL — ABNORMAL HIGH (ref 70–139)
HX GLUCOSE-POCT: 122 mg/dL (ref 70–139)
HX GLUCOSE-POCT: 155 mg/dL — ABNORMAL HIGH (ref 70–139)

## 2020-01-12 LAB — HX IMMUNOLOGY
HX EXT NCLR ANTG(ENA) SSA: <0.3 {ELISA'U}
HX EXT NCLR ANTG(ENA) SSB: <0.3 {ELISA'U}
HX RHEUMATOID FACTOR: <15 [IU]/mL

## 2020-01-12 LAB — HX DIABETES
HX GLUCOSE: 36 mg/dL — AB (ref 70–139)
HX GLUCOSE: 170 mg/dL — ABNORMAL HIGH (ref 70–139)

## 2020-01-13 LAB — HX CHEM-PANELS
HX ANION GAP: 12 (ref 3–14)
HX ANION GAP: 9 (ref 3–14)
HX BLOOD UREA NITROGEN: 32 mg/dL — ABNORMAL HIGH (ref 6–24)
HX BLOOD UREA NITROGEN: 33 mg/dL — ABNORMAL HIGH (ref 6–24)
HX CHLORIDE (CL): 92 meq/L — ABNORMAL LOW (ref 98–110)
HX CHLORIDE (CL): 89 meq/L — ABNORMAL LOW (ref 98–110)
HX CO2: 42 meq/L — AB (ref 20–30)
HX CO2: 43 meq/L — AB (ref 20–30)
HX CREATININE (CR): 1.56 mg/dL — ABNORMAL HIGH (ref 0.57–1.30)
HX CREATININE (CR): 1.54 mg/dL — ABNORMAL HIGH (ref 0.57–1.30)
HX GFR, AFRICAN AMERICAN: 57 mL/min/{1.73_m2} — ABNORMAL LOW
HX GFR, AFRICAN AMERICAN: 58 mL/min/{1.73_m2} — ABNORMAL LOW
HX GFR, NON-AFRICAN AMERICAN: 49 mL/min/{1.73_m2} — ABNORMAL LOW
HX GFR, NON-AFRICAN AMERICAN: 50 mL/min/{1.73_m2} — ABNORMAL LOW
HX GLUCOSE: 147 mg/dL — ABNORMAL HIGH (ref 70–139)
HX GLUCOSE: 88 mg/dL (ref 70–139)
HX POTASSIUM (K): 3.8 meq/L (ref 3.6–5.1)
HX POTASSIUM (K): 4.5 meq/L (ref 3.6–5.1)
HX SODIUM (NA): 146 meq/L — ABNORMAL HIGH (ref 135–145)
HX SODIUM (NA): 141 meq/L (ref 135–145)

## 2020-01-13 LAB — HX HEM-ROUTINE
HX BASO #: 0 10*3/uL (ref 0.0–0.2)
HX BASO: 1 %
HX EOSIN #: 0.1 10*3/uL (ref 0.0–0.5)
HX EOSIN: 1 %
HX HCT: 53.1 % — ABNORMAL HIGH (ref 37.0–47.0)
HX HGB: 16.4 g/dL — ABNORMAL HIGH (ref 13.5–16.0)
HX IMMATURE GRANULOCYTE#: 0 10*3/uL (ref 0.0–0.1)
HX IMMATURE GRANULOCYTE: 0 %
HX LYMPH #: 0.8 10*3/uL — ABNORMAL LOW (ref 1.0–4.0)
HX LYMPH: 11 %
HX MCH: 31.1 pg (ref 26.0–34.0)
HX MCHC: 30.9 g/dL — ABNORMAL LOW (ref 32.0–36.0)
HX MCV: 100.8 fL — ABNORMAL HIGH (ref 80.0–98.0)
HX MONO #: 1 10*3/uL — ABNORMAL HIGH (ref 0.2–0.8)
HX MONO: 13 %
HX MPV: 9.8 fL (ref 9.1–11.7)
HX NEUT #: 5.6 10*3/uL (ref 1.5–7.5)
HX NRBC #: 0 10*3/uL
HX NUCLEATED RBC: 0 %
HX PLT: 145 10*3/uL — ABNORMAL LOW (ref 150–400)
HX RBC BLOOD COUNT: 5.27 M/uL (ref 4.20–5.50)
HX RDW: 14.9 % — ABNORMAL HIGH (ref 11.5–14.5)
HX SEG NEUT: 74 %
HX WBC: 7.6 10*3/uL (ref 4.0–11.0)

## 2020-01-13 LAB — HX CHEM-LFT
HX ALANINE AMINOTRANSFERASE (ALT/SGPT): 15 IU/L (ref 0–54)
HX ALKALINE PHOSPHATASE (ALK): 264 IU/L — ABNORMAL HIGH (ref 40–130)
HX ASPARTATE AMINOTRANFERASE (AST/SGOT): 23 IU/L (ref 10–42)
HX BILIRUBIN, DIRECT: 0.6 mg/dL — ABNORMAL HIGH (ref 0.0–0.5)
HX BILIRUBIN, TOTAL: 0.9 mg/dL (ref 0.2–1.1)
HX LACTATE DEHYDROGENASE (LDH): 252 IU/L — ABNORMAL HIGH (ref 120–220)

## 2020-01-13 LAB — HX POINT OF CARE
HX GLUCOSE-POCT: 136 mg/dL (ref 70–139)
HX GLUCOSE-POCT: 258 mg/dL — ABNORMAL HIGH (ref 70–139)
HX GLUCOSE-POCT: 83 mg/dL (ref 70–139)
HX GLUCOSE-POCT: 151 mg/dL — ABNORMAL HIGH (ref 70–139)

## 2020-01-13 LAB — HX CHEM-OTHER
HX CALCIUM (CA): 8.9 mg/dL (ref 8.5–10.5)
HX CALCIUM (CA): 9.2 mg/dL (ref 8.5–10.5)
HX MAGNESIUM: 1.9 mg/dL (ref 1.6–2.6)
HX MAGNESIUM: 2.2 mg/dL (ref 1.6–2.6)
HX PHOSPHORUS: 3.4 mg/dL (ref 2.7–4.5)
HX PHOSPHORUS: 3.7 mg/dL (ref 2.7–4.5)

## 2020-01-13 LAB — HX DIABETES
HX GLUCOSE: 147 mg/dL — ABNORMAL HIGH (ref 70–139)
HX GLUCOSE: 88 mg/dL (ref 70–139)

## 2020-01-13 LAB — HX CHEM-VITAMIN: HX ERYTHROPOIETIN SO: 22.2 m[IU]/mL — ABNORMAL HIGH

## 2020-01-14 LAB — HX CHEM-OTHER
HX CALCIUM (CA): 9.2 mg/dL (ref 8.5–10.5)
HX CALCIUM (CA): 9.2 mg/dL (ref 8.5–10.5)
HX CALCIUM (CA): 9.4 mg/dL (ref 8.5–10.5)
HX MAGNESIUM: 2 mg/dL (ref 1.6–2.6)
HX MAGNESIUM: 2.2 mg/dL (ref 1.6–2.6)
HX MAGNESIUM: 2.2 mg/dL (ref 1.6–2.6)
HX PHOSPHORUS: 4.3 mg/dL (ref 2.7–4.5)
HX PHOSPHORUS: 5.2 mg/dL — ABNORMAL HIGH (ref 2.7–4.5)
HX PHOSPHORUS: 5 mg/dL — ABNORMAL HIGH (ref 2.7–4.5)

## 2020-01-14 LAB — HX CHEM-PANELS
HX ANION GAP: 13 (ref 3–14)
HX ANION GAP: 9 (ref 3–14)
HX ANION GAP: 12 (ref 3–14)
HX BLOOD UREA NITROGEN: 30 mg/dL — ABNORMAL HIGH (ref 6–24)
HX BLOOD UREA NITROGEN: 36 mg/dL — ABNORMAL HIGH (ref 6–24)
HX BLOOD UREA NITROGEN: 37 mg/dL — ABNORMAL HIGH (ref 6–24)
HX CHLORIDE (CL): 88 meq/L — ABNORMAL LOW (ref 98–110)
HX CHLORIDE (CL): 89 meq/L — ABNORMAL LOW (ref 98–110)
HX CHLORIDE (CL): 88 meq/L — ABNORMAL LOW (ref 98–110)
HX CO2: 41 meq/L — AB (ref 20–30)
HX CO2: 43 meq/L — AB (ref 20–30)
HX CO2: 42 meq/L — AB (ref 20–30)
HX CREATININE (CR): 1.67 mg/dL — ABNORMAL HIGH (ref 0.57–1.30)
HX CREATININE (CR): 1.83 mg/dL — ABNORMAL HIGH (ref 0.57–1.30)
HX CREATININE (CR): 2.02 mg/dL — ABNORMAL HIGH (ref 0.57–1.30)
HX GFR, AFRICAN AMERICAN: 52 mL/min/{1.73_m2} — ABNORMAL LOW
HX GFR, AFRICAN AMERICAN: 47 mL/min/{1.73_m2} — ABNORMAL LOW
HX GFR, AFRICAN AMERICAN: 41 mL/min/{1.73_m2} — ABNORMAL LOW
HX GFR, NON-AFRICAN AMERICAN: 45 mL/min/{1.73_m2} — ABNORMAL LOW
HX GFR, NON-AFRICAN AMERICAN: 40 mL/min/{1.73_m2} — ABNORMAL LOW
HX GFR, NON-AFRICAN AMERICAN: 36 mL/min/{1.73_m2} — ABNORMAL LOW
HX GLUCOSE: 149 mg/dL — ABNORMAL HIGH (ref 70–139)
HX GLUCOSE: 164 mg/dL — ABNORMAL HIGH (ref 70–139)
HX GLUCOSE: 277 mg/dL — ABNORMAL HIGH (ref 70–139)
HX POTASSIUM (K): 3.9 meq/L (ref 3.6–5.1)
HX POTASSIUM (K): 4.1 meq/L (ref 3.6–5.1)
HX POTASSIUM (K): 4.4 meq/L (ref 3.6–5.1)
HX SODIUM (NA): 142 meq/L (ref 135–145)
HX SODIUM (NA): 141 meq/L (ref 135–145)
HX SODIUM (NA): 142 meq/L (ref 135–145)

## 2020-01-14 LAB — HX HEM-ROUTINE
HX BASO #: 0.1 10*3/uL (ref 0.0–0.2)
HX BASO: 1 %
HX EOSIN #: 0.1 10*3/uL (ref 0.0–0.5)
HX EOSIN: 2 %
HX HCT: 56.4 % — ABNORMAL HIGH (ref 37.0–47.0)
HX HGB: 17.3 g/dL — ABNORMAL HIGH (ref 13.5–16.0)
HX IMMATURE GRANULOCYTE#: 0 10*3/uL (ref 0.0–0.1)
HX IMMATURE GRANULOCYTE: 1 %
HX LYMPH #: 1 10*3/uL (ref 1.0–4.0)
HX LYMPH: 11 %
HX MCH: 31.8 pg (ref 26.0–34.0)
HX MCHC: 30.7 g/dL — ABNORMAL LOW (ref 32.0–36.0)
HX MCV: 103.7 fL — ABNORMAL HIGH (ref 80.0–98.0)
HX MONO #: 1.1 10*3/uL — ABNORMAL HIGH (ref 0.2–0.8)
HX MONO: 13 %
HX MPV: 10.4 fL (ref 9.1–11.7)
HX NEUT #: 6.4 10*3/uL (ref 1.5–7.5)
HX NRBC #: 0 10*3/uL
HX NUCLEATED RBC: 0 %
HX PLT: 163 10*3/uL (ref 150–400)
HX RBC BLOOD COUNT: 5.44 M/uL (ref 4.20–5.50)
HX RDW: 15 % — ABNORMAL HIGH (ref 11.5–14.5)
HX SEG NEUT: 73 %
HX WBC: 8.7 10*3/uL (ref 4.0–11.0)

## 2020-01-14 LAB — HX POINT OF CARE
HX GLUCOSE-POCT: 140 mg/dL — ABNORMAL HIGH (ref 70–139)
HX GLUCOSE-POCT: 250 mg/dL — ABNORMAL HIGH (ref 70–139)
HX GLUCOSE-POCT: 107 mg/dL (ref 70–139)
HX GLUCOSE-POCT: 231 mg/dL — ABNORMAL HIGH (ref 70–139)

## 2020-01-14 LAB — HX DIABETES
HX GLUCOSE: 149 mg/dL — ABNORMAL HIGH (ref 70–139)
HX GLUCOSE: 164 mg/dL — ABNORMAL HIGH (ref 70–139)
HX GLUCOSE: 277 mg/dL — ABNORMAL HIGH (ref 70–139)

## 2020-01-14 LAB — HX CHEM-LFT: HX GAMMA GLUTAMYL TRANSFERASE (GGT): 781 IU/L — ABNORMAL HIGH (ref 0–70)

## 2020-01-14 LAB — HX IMMUNOLOGY: HX CYCLIC CITRULLINE PEPTIDE (CCP) IGG: <16 U

## 2020-01-15 LAB — HX CHEM-PANELS
HX ANION GAP: 13 (ref 3–14)
HX ANION GAP: 10 (ref 3–14)
HX BLOOD UREA NITROGEN: 39 mg/dL — ABNORMAL HIGH (ref 6–24)
HX BLOOD UREA NITROGEN: 44 mg/dL — ABNORMAL HIGH (ref 6–24)
HX CHLORIDE (CL): 92 meq/L — ABNORMAL LOW (ref 98–110)
HX CHLORIDE (CL): 91 meq/L — ABNORMAL LOW (ref 98–110)
HX CO2: 38 meq/L — ABNORMAL HIGH (ref 20–30)
HX CO2: 39 meq/L — ABNORMAL HIGH (ref 20–30)
HX CREATININE (CR): 1.99 mg/dL — ABNORMAL HIGH (ref 0.57–1.30)
HX CREATININE (CR): 2.36 mg/dL — ABNORMAL HIGH (ref 0.57–1.30)
HX GFR, AFRICAN AMERICAN, WHOLE BLOOD: 37 mL/min/{1.73_m2}
HX GFR, AFRICAN AMERICAN: 42 mL/min/{1.73_m2} — ABNORMAL LOW
HX GFR, AFRICAN AMERICAN: 34 mL/min/{1.73_m2} — ABNORMAL LOW
HX GFR, NON- AFRICAN AMERICAN, WHOLE BLOOD: 32 mL/min/{1.73_m2}
HX GFR, NON-AFRICAN AMERICAN: 36 mL/min/{1.73_m2} — ABNORMAL LOW
HX GFR, NON-AFRICAN AMERICAN: 30 mL/min/{1.73_m2} — ABNORMAL LOW
HX GLUCOSE: 114 mg/dL (ref 70–139)
HX GLUCOSE: 217 mg/dL — ABNORMAL HIGH (ref 70–139)
HX POTASSIUM (K): 4 meq/L (ref 3.6–5.1)
HX POTASSIUM (K): 4.2 meq/L (ref 3.6–5.1)
HX SODIUM (NA): 143 meq/L (ref 135–145)
HX SODIUM (NA): 140 meq/L (ref 135–145)
HX WHOLE BLOOD GLUCOSE: 167 mg/dL — ABNORMAL HIGH (ref 70–139)

## 2020-01-15 LAB — HX HEM-ROUTINE
HX BASO #: 0 10*3/uL (ref 0.0–0.2)
HX BASO: 1 %
HX EOSIN #: 0.1 10*3/uL (ref 0.0–0.5)
HX EOSIN: 1 %
HX HCT: 54.7 % — ABNORMAL HIGH (ref 37.0–47.0)
HX HGB: 16.4 g/dL — ABNORMAL HIGH (ref 13.5–16.0)
HX IMMATURE GRANULOCYTE#: 0 10*3/uL (ref 0.0–0.1)
HX IMMATURE GRANULOCYTE: 0 %
HX LYMPH #: 1.1 10*3/uL (ref 1.0–4.0)
HX LYMPH: 13 %
HX MCH: 31.4 pg (ref 26.0–34.0)
HX MCHC: 30 g/dL — ABNORMAL LOW (ref 32.0–36.0)
HX MCV: 104.8 fL — ABNORMAL HIGH (ref 80.0–98.0)
HX MONO #: 1.2 10*3/uL — ABNORMAL HIGH (ref 0.2–0.8)
HX MONO: 14 %
HX MPV: 10 fL (ref 9.1–11.7)
HX NEUT #: 5.9 10*3/uL (ref 1.5–7.5)
HX NRBC #: 0 10*3/uL
HX NUCLEATED RBC: 0 %
HX PLT: 159 10*3/uL (ref 150–400)
HX RBC BLOOD COUNT: 5.22 M/uL (ref 4.20–5.50)
HX RDW: 15.1 % — ABNORMAL HIGH (ref 11.5–14.5)
HX SEG NEUT: 71 %
HX WBC: 8.4 10*3/uL (ref 4.0–11.0)

## 2020-01-15 LAB — HX DIABETES
HX GLUCOSE: 114 mg/dL (ref 70–139)
HX GLUCOSE: 217 mg/dL — ABNORMAL HIGH (ref 70–139)

## 2020-01-15 LAB — HX CHEM-BLOODGAS
HX BASE EXCESS: 13.8 mmol/L
HX BASE EXCESS: 14.6 mmol/L
HX BICARBONATE: 40 meq/L — AB (ref 21–28)
HX BICARBONATE: 41 meq/L — AB (ref 21–28)
HX CALCULATED CO2: 35 meq/L — ABNORMAL HIGH (ref 17–32)
HX CALCULATED CO2: 35 meq/L — ABNORMAL HIGH (ref 17–32)
HX CARBOXYHEMOGLOBIN: 2.2 % (ref 0.0–3.0)
HX CARBOXYHEMOGLOBIN: 1.8 % (ref 0.0–3.0)
HX METHEMOGLOBIN: 0.5 % (ref 0.0–1.8)
HX METHEMOGLOBIN: 1 % (ref 0.0–1.8)
HX OXYGEN SATURATION, MEASURED (SVO2): 89.6 % — ABNORMAL LOW (ref 95.0–98.0)
HX OXYGEN SATURATION, MEASURED (SVO2): 94.2 % — ABNORMAL LOW (ref 95.0–98.0)
HX PCO2: 80 mmHg — ABNORMAL HIGH (ref 35–45)
HX PCO2: 76 mmHg — ABNORMAL HIGH (ref 35–45)
HX PH: 7.32 — ABNORMAL LOW (ref 7.35–7.45)
HX PH: 7.35 (ref 7.35–7.45)
HX PO2: 63 mmHg — ABNORMAL LOW (ref 85–105)
HX PO2: 80 mmHg — ABNORMAL LOW (ref 85–105)
HX WHOLE BLOOD CREATININE: 2.2 mg/dL — ABNORMAL HIGH (ref 0.4–1.3)

## 2020-01-15 LAB — HX CHEM-OTHER
HX CALCIUM (CA): 8.9 mg/dL (ref 8.5–10.5)
HX CALCIUM (CA): 9.1 mg/dL (ref 8.5–10.5)
HX MAGNESIUM: 2.4 mg/dL (ref 1.6–2.6)
HX MAGNESIUM: 2.7 mg/dL — ABNORMAL HIGH (ref 1.6–2.6)
HX PHOSPHORUS: 6 mg/dL — ABNORMAL HIGH (ref 2.7–4.5)
HX PHOSPHORUS: 5.6 mg/dL — ABNORMAL HIGH (ref 2.7–4.5)

## 2020-01-15 LAB — HX POINT OF CARE
HX GLUCOSE-POCT: 130 mg/dL (ref 70–139)
HX GLUCOSE-POCT: 142 mg/dL — ABNORMAL HIGH (ref 70–139)
HX GLUCOSE-POCT: 101 mg/dL (ref 70–139)

## 2020-01-16 LAB — HX CHEM-PANELS
HX ANION GAP: 10 (ref 3–14)
HX ANION GAP: 11 (ref 3–14)
HX BLOOD UREA NITROGEN: 49 mg/dL — ABNORMAL HIGH (ref 6–24)
HX BLOOD UREA NITROGEN: 55 mg/dL — ABNORMAL HIGH (ref 6–24)
HX CHLORIDE (CL): 97 meq/L — ABNORMAL LOW (ref 98–110)
HX CHLORIDE (CL): 97 meq/L — ABNORMAL LOW (ref 98–110)
HX CO2: 37 meq/L — ABNORMAL HIGH (ref 20–30)
HX CO2: 33 meq/L — ABNORMAL HIGH (ref 20–30)
HX CREATININE (CR): 2.55 mg/dL — ABNORMAL HIGH (ref 0.57–1.30)
HX CREATININE (CR): 2.6 mg/dL — ABNORMAL HIGH (ref 0.57–1.30)
HX GFR, AFRICAN AMERICAN: 31 mL/min/{1.73_m2} — ABNORMAL LOW
HX GFR, AFRICAN AMERICAN: 31 mL/min/{1.73_m2} — ABNORMAL LOW
HX GFR, NON-AFRICAN AMERICAN: 27 mL/min/{1.73_m2} — ABNORMAL LOW
HX GFR, NON-AFRICAN AMERICAN: 26 mL/min/{1.73_m2} — ABNORMAL LOW
HX GLUCOSE: 150 mg/dL — ABNORMAL HIGH (ref 70–139)
HX GLUCOSE: 178 mg/dL — ABNORMAL HIGH (ref 70–139)
HX POTASSIUM (K): 4.6 meq/L (ref 3.6–5.1)
HX POTASSIUM (K): 4.7 meq/L (ref 3.6–5.1)
HX SODIUM (NA): 144 meq/L (ref 135–145)
HX SODIUM (NA): 141 meq/L (ref 135–145)

## 2020-01-16 LAB — HX BF-URINALYSIS
HX HYALINE CAST: 0.4 /LPF
HX KETONES: NEGATIVE mg/dL
HX LEUKOCYTE ES: NEGATIVE
HX NITRITE LEVEL: NEGATIVE
HX RED BLOOD CELLS: 2 /HPF
HX SPECIFIC GRAVITY: 1.011
HX SQUAMOUS EPITHELIAL CELLS: NEGATIVE /HPF
HX U BACTERIA: NEGATIVE
HX U BILIRUBIN: NEGATIVE
HX U BLOOD: NEGATIVE
HX U GLUCOSE: NEGATIVE mg/dL
HX U PH: 8
HX U UROBILINIG: 0.2 EU
HX WHITE BLOOD CELLS: <1 /HPF

## 2020-01-16 LAB — HX POINT OF CARE
HX GLUCOSE-POCT: 140 mg/dL — ABNORMAL HIGH (ref 70–139)
HX GLUCOSE-POCT: 333 mg/dL — ABNORMAL HIGH (ref 70–139)
HX GLUCOSE-POCT: 103 mg/dL (ref 70–139)
HX GLUCOSE-POCT: 180 mg/dL — ABNORMAL HIGH (ref 70–139)

## 2020-01-16 LAB — HX HEM-ROUTINE
HX BASO #: 0.1 10*3/uL (ref 0.0–0.2)
HX BASO: 1 %
HX EOSIN #: 0.1 10*3/uL (ref 0.0–0.5)
HX EOSIN: 2 %
HX HCT: 58.2 % — ABNORMAL HIGH (ref 37.0–47.0)
HX HGB: 17.4 g/dL — ABNORMAL HIGH (ref 13.5–16.0)
HX IMMATURE GRANULOCYTE#: 0 10*3/uL (ref 0.0–0.1)
HX IMMATURE GRANULOCYTE: 0 %
HX LYMPH #: 0.8 10*3/uL — ABNORMAL LOW (ref 1.0–4.0)
HX LYMPH: 11 %
HX MCH: 31.6 pg (ref 26.0–34.0)
HX MCHC: 29.9 g/dL — ABNORMAL LOW (ref 32.0–36.0)
HX MCV: 105.8 fL — ABNORMAL HIGH (ref 80.0–98.0)
HX MONO #: 1 10*3/uL — ABNORMAL HIGH (ref 0.2–0.8)
HX MONO: 13 %
HX MPV: 10.1 fL (ref 9.1–11.7)
HX NEUT #: 5.4 10*3/uL (ref 1.5–7.5)
HX NRBC #: 0 10*3/uL
HX NUCLEATED RBC: 0 %
HX PLT: 164 10*3/uL (ref 150–400)
HX RBC BLOOD COUNT: 5.5 M/uL (ref 4.20–5.50)
HX RDW: 15.4 % — ABNORMAL HIGH (ref 11.5–14.5)
HX SEG NEUT: 73 %
HX WBC: 7.4 10*3/uL (ref 4.0–11.0)

## 2020-01-16 LAB — HX CHEM-LFT
HX ALANINE AMINOTRANSFERASE (ALT/SGPT): 16 IU/L (ref 0–54)
HX ALKALINE PHOSPHATASE (ALK): 237 IU/L — ABNORMAL HIGH (ref 40–130)
HX AMMONIA (NH3): 79 ug/dL — ABNORMAL HIGH (ref 16–60)
HX ASPARTATE AMINOTRANFERASE (AST/SGOT): 21 IU/L (ref 10–42)
HX BILIRUBIN, DIRECT: 0.6 mg/dL — ABNORMAL HIGH (ref 0.0–0.5)
HX BILIRUBIN, TOTAL: 1 mg/dL (ref 0.2–1.1)
HX GAMMA GLUTAMYL TRANSFERASE (GGT): 735 IU/L — ABNORMAL HIGH (ref 0–70)
HX LACTATE DEHYDROGENASE (LDH): 288 IU/L — ABNORMAL HIGH (ref 120–220)

## 2020-01-16 LAB — HX CHEM-OTHER
HX CALCIUM (CA): 9.2 mg/dL (ref 8.5–10.5)
HX CALCIUM (CA): 9 mg/dL (ref 8.5–10.5)
HX MAGNESIUM: 2.9 mg/dL — ABNORMAL HIGH (ref 1.6–2.6)
HX MAGNESIUM: 2.7 mg/dL — ABNORMAL HIGH (ref 1.6–2.6)
HX PHOSPHORUS: 5.7 mg/dL — ABNORMAL HIGH (ref 2.7–4.5)
HX PHOSPHORUS: 4.7 mg/dL — ABNORMAL HIGH (ref 2.7–4.5)

## 2020-01-16 LAB — HX BF-CHEM/URINE
HX CREATININE, RANDOM URINE: 53.35 mg/dL
HX SODIUM, RANDOM URINE: 49 meq/L
HX UREA NITROGEN, RANDOM URINE: 268 mg/dL

## 2020-01-16 LAB — HX DIABETES
HX GLUCOSE: 150 mg/dL — ABNORMAL HIGH (ref 70–139)
HX GLUCOSE: 178 mg/dL — ABNORMAL HIGH (ref 70–139)

## 2020-01-17 LAB — HX HEM-ROUTINE
HX BASO #: 0 10*3/uL (ref 0.0–0.2)
HX BASO: 1 %
HX EOSIN #: 0.1 10*3/uL (ref 0.0–0.5)
HX EOSIN: 1 %
HX HCT: 53.2 % — ABNORMAL HIGH (ref 37.0–47.0)
HX HGB: 16 g/dL (ref 13.5–16.0)
HX IMMATURE GRANULOCYTE#: 0 10*3/uL (ref 0.0–0.1)
HX IMMATURE GRANULOCYTE: 1 %
HX LYMPH #: 0.7 10*3/uL — ABNORMAL LOW (ref 1.0–4.0)
HX LYMPH: 9 %
HX MCH: 31.6 pg (ref 26.0–34.0)
HX MCHC: 30.1 g/dL — ABNORMAL LOW (ref 32.0–36.0)
HX MCV: 105.1 fL — ABNORMAL HIGH (ref 80.0–98.0)
HX MONO #: 0.9 10*3/uL — ABNORMAL HIGH (ref 0.2–0.8)
HX MONO: 12 %
HX MPV: 10.5 fL (ref 9.1–11.7)
HX NEUT #: 6.1 10*3/uL (ref 1.5–7.5)
HX NRBC #: 0 10*3/uL
HX NUCLEATED RBC: 0 %
HX PLT: 174 10*3/uL (ref 150–400)
HX RBC BLOOD COUNT: 5.06 M/uL (ref 4.20–5.50)
HX RDW: 15.3 % — ABNORMAL HIGH (ref 11.5–14.5)
HX SEG NEUT: 77 %
HX WBC: 7.9 10*3/uL (ref 4.0–11.0)

## 2020-01-17 LAB — HX POINT OF CARE
HX GLUCOSE-POCT: 150 mg/dL — ABNORMAL HIGH (ref 70–139)
HX GLUCOSE-POCT: 215 mg/dL — ABNORMAL HIGH (ref 70–139)
HX GLUCOSE-POCT: 185 mg/dL — ABNORMAL HIGH (ref 70–139)
HX GLUCOSE-POCT: 182 mg/dL — ABNORMAL HIGH (ref 70–139)

## 2020-01-17 LAB — HX CHEM-PANELS
HX ANION GAP: 9 (ref 3–14)
HX BLOOD UREA NITROGEN: 51 mg/dL — ABNORMAL HIGH (ref 6–24)
HX CHLORIDE (CL): 100 meq/L (ref 98–110)
HX CO2: 35 meq/L — ABNORMAL HIGH (ref 20–30)
HX CREATININE (CR): 2.44 mg/dL — ABNORMAL HIGH (ref 0.57–1.30)
HX GFR, AFRICAN AMERICAN: 33 mL/min/{1.73_m2} — ABNORMAL LOW
HX GFR, NON-AFRICAN AMERICAN: 28 mL/min/{1.73_m2} — ABNORMAL LOW
HX GLUCOSE: 138 mg/dL (ref 70–139)
HX POTASSIUM (K): 4 meq/L (ref 3.6–5.1)
HX SODIUM (NA): 144 meq/L (ref 135–145)

## 2020-01-17 LAB — HX TOXICOLOGY-TDM
HX CARBAMAZEPINE: 5.3 ug/mL (ref 4.0–12.0)
HX CARBAMAZEPINE: 5.5 ug/mL (ref 4.0–12.0)
HX VANCOMYCIN PRE: 18.5 ug/mL (ref 10.0–20.0)

## 2020-01-17 LAB — HX CHEM-OTHER
HX CALCIUM (CA): 8.9 mg/dL (ref 8.5–10.5)
HX MAGNESIUM: 2.8 mg/dL — ABNORMAL HIGH (ref 1.6–2.6)
HX PHOSPHORUS: 5.5 mg/dL — ABNORMAL HIGH (ref 2.7–4.5)

## 2020-01-17 LAB — HX MICRO

## 2020-01-17 LAB — HX DIABETES: HX GLUCOSE: 138 mg/dL (ref 70–139)

## 2020-01-18 LAB — HX CHEM-PANELS
HX ANION GAP: 10 (ref 3–14)
HX BLOOD UREA NITROGEN: 42 mg/dL — ABNORMAL HIGH (ref 6–24)
HX CHLORIDE (CL): 106 meq/L (ref 98–110)
HX CO2: 30 meq/L (ref 20–30)
HX CREATININE (CR): 1.92 mg/dL — ABNORMAL HIGH (ref 0.57–1.30)
HX GFR, AFRICAN AMERICAN: 44 mL/min/{1.73_m2} — ABNORMAL LOW
HX GFR, NON-AFRICAN AMERICAN: 38 mL/min/{1.73_m2} — ABNORMAL LOW
HX GLUCOSE: 167 mg/dL — ABNORMAL HIGH (ref 70–139)
HX POTASSIUM (K): 4.2 meq/L (ref 3.6–5.1)
HX SODIUM (NA): 146 meq/L — ABNORMAL HIGH (ref 135–145)

## 2020-01-18 LAB — HX POINT OF CARE
HX GLUCOSE-POCT: 148 mg/dL — ABNORMAL HIGH (ref 70–139)
HX GLUCOSE-POCT: 233 mg/dL — ABNORMAL HIGH (ref 70–139)
HX GLUCOSE-POCT: 376 mg/dL — ABNORMAL HIGH (ref 70–139)
HX GLUCOSE-POCT: 174 mg/dL — ABNORMAL HIGH (ref 70–139)

## 2020-01-18 LAB — HX HEM-ROUTINE
HX BASO #: 0.1 10*3/uL (ref 0.0–0.2)
HX BASO: 1 %
HX EOSIN #: 0.2 10*3/uL (ref 0.0–0.5)
HX EOSIN: 3 %
HX HCT: 53 % — ABNORMAL HIGH (ref 37.0–47.0)
HX HGB: 15.7 g/dL (ref 13.5–16.0)
HX IMMATURE GRANULOCYTE#: 0 10*3/uL (ref 0.0–0.1)
HX IMMATURE GRANULOCYTE: 1 %
HX LYMPH #: 0.6 10*3/uL — ABNORMAL LOW (ref 1.0–4.0)
HX LYMPH: 10 %
HX MCH: 31.4 pg (ref 26.0–34.0)
HX MCHC: 29.6 g/dL — ABNORMAL LOW (ref 32.0–36.0)
HX MCV: 106 fL — ABNORMAL HIGH (ref 80.0–98.0)
HX MONO #: 0.9 10*3/uL — ABNORMAL HIGH (ref 0.2–0.8)
HX MONO: 14 %
HX MPV: 10.3 fL (ref 9.1–11.7)
HX NEUT #: 4.7 10*3/uL (ref 1.5–7.5)
HX NRBC #: 0 10*3/uL
HX NUCLEATED RBC: 0 %
HX PLT: 150 10*3/uL (ref 150–400)
HX RBC BLOOD COUNT: 5 M/uL (ref 4.20–5.50)
HX RDW: 15.3 % — ABNORMAL HIGH (ref 11.5–14.5)
HX SEG NEUT: 72 %
HX WBC: 6.5 10*3/uL (ref 4.0–11.0)

## 2020-01-18 LAB — HX TOXICOLOGY-TDM: HX VANCOMYCIN (RANDOM): 19.7 ug/mL

## 2020-01-18 LAB — HX CHEM-OTHER
HX CALCIUM (CA): 9.4 mg/dL (ref 8.5–10.5)
HX MAGNESIUM: 2.3 mg/dL (ref 1.6–2.6)
HX PHOSPHORUS: 4.4 mg/dL (ref 2.7–4.5)

## 2020-01-18 LAB — HX DIABETES: HX GLUCOSE: 167 mg/dL — ABNORMAL HIGH (ref 70–139)

## 2020-01-18 LAB — HX CHEM-METABOLIC: HX PSA DIAGNOSTIC: 2.49 ng/mL (ref 0.00–4.00)

## 2020-01-18 LAB — HX PSA: HX PSA DIAGNOSTIC: 2.49 ng/mL (ref 0.00–4.00)

## 2020-01-19 LAB — HX CHEM-OTHER
HX CALCIUM (CA): 9 mg/dL (ref 8.5–10.5)
HX MAGNESIUM: 2.2 mg/dL (ref 1.6–2.6)
HX PHOSPHORUS: 5.2 mg/dL — ABNORMAL HIGH (ref 2.7–4.5)

## 2020-01-19 LAB — HX CHEM-PANELS
HX ANION GAP: 8 (ref 3–14)
HX BLOOD UREA NITROGEN: 36 mg/dL — ABNORMAL HIGH (ref 6–24)
HX CHLORIDE (CL): 109 meq/L (ref 98–110)
HX CO2: 30 meq/L (ref 20–30)
HX CREATININE (CR): 1.63 mg/dL — ABNORMAL HIGH (ref 0.57–1.30)
HX GFR, AFRICAN AMERICAN: 54 mL/min/{1.73_m2} — ABNORMAL LOW
HX GFR, NON-AFRICAN AMERICAN: 46 mL/min/{1.73_m2} — ABNORMAL LOW
HX GLUCOSE: 147 mg/dL — ABNORMAL HIGH (ref 70–139)
HX POTASSIUM (K): 4.2 meq/L (ref 3.6–5.1)
HX SODIUM (NA): 147 meq/L — ABNORMAL HIGH (ref 135–145)

## 2020-01-19 LAB — HX HEM-ROUTINE
HX BASO #: 0.1 10*3/uL (ref 0.0–0.2)
HX BASO: 1 %
HX EOSIN #: 0.3 10*3/uL (ref 0.0–0.5)
HX EOSIN: 4 %
HX HCT: 51.8 % — ABNORMAL HIGH (ref 37.0–47.0)
HX HGB: 15.8 g/dL (ref 13.5–16.0)
HX IMMATURE GRANULOCYTE#: 0.1 10*3/uL (ref 0.0–0.1)
HX IMMATURE GRANULOCYTE: 1 %
HX LYMPH #: 0.8 10*3/uL — ABNORMAL LOW (ref 1.0–4.0)
HX LYMPH: 12 %
HX MCH: 32 pg (ref 26.0–34.0)
HX MCHC: 30.5 g/dL — ABNORMAL LOW (ref 32.0–36.0)
HX MCV: 105.1 fL — ABNORMAL HIGH (ref 80.0–98.0)
HX MONO #: 1 10*3/uL — ABNORMAL HIGH (ref 0.2–0.8)
HX MONO: 15 %
HX MPV: 10.5 fL (ref 9.1–11.7)
HX NEUT #: 4.4 10*3/uL (ref 1.5–7.5)
HX NRBC #: 0 10*3/uL
HX NUCLEATED RBC: 0 %
HX PLT: 187 10*3/uL (ref 150–400)
HX RBC BLOOD COUNT: 4.93 M/uL (ref 4.20–5.50)
HX RDW: 15.1 % — ABNORMAL HIGH (ref 11.5–14.5)
HX SEG NEUT: 67 %
HX WBC: 6.6 10*3/uL (ref 4.0–11.0)

## 2020-01-19 LAB — HX MICRO

## 2020-01-19 LAB — HX TOXICOLOGY-TDM: HX VANCOMYCIN PRE: 11.2 ug/mL (ref 10.0–20.0)

## 2020-01-19 LAB — HX POINT OF CARE
HX GLUCOSE-POCT: 154 mg/dL — ABNORMAL HIGH (ref 70–139)
HX GLUCOSE-POCT: 223 mg/dL — ABNORMAL HIGH (ref 70–139)
HX GLUCOSE-POCT: 207 mg/dL — ABNORMAL HIGH (ref 70–139)
HX GLUCOSE-POCT: 106 mg/dL (ref 70–139)

## 2020-01-19 LAB — HX DIABETES: HX GLUCOSE: 147 mg/dL — ABNORMAL HIGH (ref 70–139)

## 2020-01-20 LAB — HX HEM-ROUTINE
HX BASO #: 0.1 10*3/uL (ref 0.0–0.2)
HX BASO: 1 %
HX EOSIN #: 0.2 10*3/uL (ref 0.0–0.5)
HX EOSIN: 3 %
HX HCT: 53.8 % — ABNORMAL HIGH (ref 37.0–47.0)
HX HGB: 16.3 g/dL — ABNORMAL HIGH (ref 13.5–16.0)
HX IMMATURE GRANULOCYTE#: 0.1 10*3/uL (ref 0.0–0.1)
HX IMMATURE GRANULOCYTE: 1 %
HX LYMPH #: 0.9 10*3/uL — ABNORMAL LOW (ref 1.0–4.0)
HX LYMPH: 13 %
HX MCH: 31.4 pg (ref 26.0–34.0)
HX MCHC: 30.3 g/dL — ABNORMAL LOW (ref 32.0–36.0)
HX MCV: 103.7 fL — ABNORMAL HIGH (ref 80.0–98.0)
HX MONO #: 1 10*3/uL — ABNORMAL HIGH (ref 0.2–0.8)
HX MONO: 13 %
HX MPV: 10.5 fL (ref 9.1–11.7)
HX NEUT #: 5.2 10*3/uL (ref 1.5–7.5)
HX NRBC #: 0 10*3/uL
HX NUCLEATED RBC: 0 %
HX PLT: 185 10*3/uL (ref 150–400)
HX RBC BLOOD COUNT: 5.19 M/uL (ref 4.20–5.50)
HX RDW: 14.7 % — ABNORMAL HIGH (ref 11.5–14.5)
HX SEG NEUT: 69 %
HX WBC: 7.6 10*3/uL (ref 4.0–11.0)

## 2020-01-20 LAB — HX CHEM-OTHER
HX CALCIUM (CA): 9 mg/dL (ref 8.5–10.5)
HX MAGNESIUM: 2 mg/dL (ref 1.6–2.6)
HX PHOSPHORUS: 3.7 mg/dL (ref 2.7–4.5)

## 2020-01-20 LAB — HX CHEM-PANELS
HX ANION GAP: 9 (ref 3–14)
HX BLOOD UREA NITROGEN: 30 mg/dL — ABNORMAL HIGH (ref 6–24)
HX CHLORIDE (CL): 107 meq/L (ref 98–110)
HX CO2: 24 meq/L (ref 20–30)
HX CREATININE (CR): 1.44 mg/dL — ABNORMAL HIGH (ref 0.57–1.30)
HX GFR, AFRICAN AMERICAN: 62 mL/min/{1.73_m2}
HX GFR, NON-AFRICAN AMERICAN: 54 mL/min/{1.73_m2} — ABNORMAL LOW
HX GLUCOSE: 163 mg/dL — ABNORMAL HIGH (ref 70–139)
HX POTASSIUM (K): 4.4 meq/L (ref 3.6–5.1)
HX SODIUM (NA): 140 meq/L (ref 135–145)

## 2020-01-20 LAB — HX POINT OF CARE
HX GLUCOSE-POCT: 171 mg/dL — ABNORMAL HIGH (ref 70–139)
HX GLUCOSE-POCT: 136 mg/dL (ref 70–139)
HX GLUCOSE-POCT: 189 mg/dL — ABNORMAL HIGH (ref 70–139)
HX GLUCOSE-POCT: 129 mg/dL (ref 70–139)

## 2020-01-20 LAB — HX DIABETES: HX GLUCOSE: 163 mg/dL — ABNORMAL HIGH (ref 70–139)

## 2020-01-21 LAB — HX DIABETES: HX GLUCOSE: 138 mg/dL (ref 70–139)

## 2020-01-21 LAB — HX CHEM-PANELS
HX ANION GAP: 8 (ref 3–14)
HX BLOOD UREA NITROGEN: 29 mg/dL — ABNORMAL HIGH (ref 6–24)
HX CHLORIDE (CL): 105 meq/L (ref 98–110)
HX CO2: 25 meq/L (ref 20–30)
HX CREATININE (CR): 1.29 mg/dL (ref 0.57–1.30)
HX GFR, AFRICAN AMERICAN: 71 mL/min/{1.73_m2}
HX GFR, NON-AFRICAN AMERICAN: 62 mL/min/{1.73_m2}
HX GLUCOSE: 138 mg/dL (ref 70–139)
HX POTASSIUM (K): 4.4 meq/L (ref 3.6–5.1)
HX SODIUM (NA): 138 meq/L (ref 135–145)

## 2020-01-21 LAB — HX POINT OF CARE
HX GLUCOSE-POCT: 149 mg/dL — ABNORMAL HIGH (ref 70–139)
HX GLUCOSE-POCT: 358 mg/dL — ABNORMAL HIGH (ref 70–139)
HX GLUCOSE-POCT: 70 mg/dL (ref 70–139)
HX GLUCOSE-POCT: 183 mg/dL — ABNORMAL HIGH (ref 70–139)

## 2020-01-21 LAB — HX IMMUNOLOGY
HX QUANTIFERON-MITOGEN-NIL: 5.95 [IU]/mL
HX QUANTIFERON-NIL: 0.01 [IU]/mL
HX QUANTIFERON-TB PLUS, 4T: NEGATIVE
HX QUANTIFERON-TB1-NIL: 0.01 [IU]/mL
HX QUANTIFERON-TB2-NIL: 0 [IU]/mL

## 2020-01-21 LAB — HX CHEM-OTHER
HX CALCIUM (CA): 8.8 mg/dL (ref 8.5–10.5)
HX MAGNESIUM: 1.8 mg/dL (ref 1.6–2.6)
HX PHOSPHORUS: 4.3 mg/dL (ref 2.7–4.5)

## 2020-01-22 LAB — HX CHEM-OTHER
HX CALCIUM (CA): 9 mg/dL (ref 8.5–10.5)
HX MAGNESIUM: 1.9 mg/dL (ref 1.6–2.6)
HX PHOSPHORUS: 4.2 mg/dL (ref 2.7–4.5)

## 2020-01-22 LAB — HX CHEM-PANELS
HX ANION GAP: 9 (ref 3–14)
HX BLOOD UREA NITROGEN: 32 mg/dL — ABNORMAL HIGH (ref 6–24)
HX CHLORIDE (CL): 105 meq/L (ref 98–110)
HX CO2: 26 meq/L (ref 20–30)
HX CREATININE (CR): 1.42 mg/dL — ABNORMAL HIGH (ref 0.57–1.30)
HX GFR, AFRICAN AMERICAN: 64 mL/min/{1.73_m2}
HX GFR, NON-AFRICAN AMERICAN: 55 mL/min/{1.73_m2} — ABNORMAL LOW
HX GLUCOSE: 212 mg/dL — ABNORMAL HIGH (ref 70–139)
HX POTASSIUM (K): 4.5 meq/L (ref 3.6–5.1)
HX SODIUM (NA): 140 meq/L (ref 135–145)

## 2020-01-22 LAB — HX HEM-ROUTINE
HX BASO #: 0.1 10*3/uL (ref 0.0–0.2)
HX BASO: 2 %
HX EOSIN #: 0.2 10*3/uL (ref 0.0–0.5)
HX EOSIN: 3 %
HX HCT: 52.3 % — ABNORMAL HIGH (ref 37.0–47.0)
HX HGB: 16.2 g/dL — ABNORMAL HIGH (ref 13.5–16.0)
HX IMMATURE GRANULOCYTE#: 0.1 10*3/uL (ref 0.0–0.1)
HX IMMATURE GRANULOCYTE: 2 %
HX LYMPH #: 1.1 10*3/uL (ref 1.0–4.0)
HX LYMPH: 15 %
HX MCH: 31.4 pg (ref 26.0–34.0)
HX MCHC: 31 g/dL — ABNORMAL LOW (ref 32.0–36.0)
HX MCV: 101.4 fL — ABNORMAL HIGH (ref 80.0–98.0)
HX MONO #: 1 10*3/uL — ABNORMAL HIGH (ref 0.2–0.8)
HX MONO: 13 %
HX MPV: 10.6 fL (ref 9.1–11.7)
HX NEUT #: 4.9 10*3/uL (ref 1.5–7.5)
HX NRBC #: 0 10*3/uL
HX NUCLEATED RBC: 0 %
HX PLT: 228 10*3/uL (ref 150–400)
HX RBC BLOOD COUNT: 5.16 M/uL (ref 4.20–5.50)
HX RDW: 14.3 % (ref 11.5–14.5)
HX SEG NEUT: 66 %
HX WBC: 7.4 10*3/uL (ref 4.0–11.0)

## 2020-01-22 LAB — HX DIABETES: HX GLUCOSE: 212 mg/dL — ABNORMAL HIGH (ref 70–139)

## 2020-01-22 LAB — HX POINT OF CARE
HX GLUCOSE-POCT: 128 mg/dL (ref 70–139)
HX GLUCOSE-POCT: 127 mg/dL (ref 70–139)
HX GLUCOSE-POCT: 107 mg/dL (ref 70–139)
HX GLUCOSE-POCT: 128 mg/dL (ref 70–139)

## 2020-01-22 LAB — HX MICRO

## 2020-01-23 ENCOUNTER — Ambulatory Visit

## 2020-01-23 LAB — HX HEM-ROUTINE
HX BASO #: 0.1 10*3/uL (ref 0.0–0.2)
HX BASO: 1 %
HX EOSIN #: 0.2 10*3/uL (ref 0.0–0.5)
HX EOSIN: 3 %
HX HCT: 50.3 % — ABNORMAL HIGH (ref 37.0–47.0)
HX HGB: 15.6 g/dL (ref 13.5–16.0)
HX IMMATURE GRANULOCYTE#: 0.2 10*3/uL — ABNORMAL HIGH (ref 0.0–0.1)
HX IMMATURE GRANULOCYTE: 2 %
HX LYMPH #: 1.3 10*3/uL (ref 1.0–4.0)
HX LYMPH: 17 %
HX MCH: 31.1 pg (ref 26.0–34.0)
HX MCHC: 31 g/dL — ABNORMAL LOW (ref 32.0–36.0)
HX MCV: 100.4 fL — ABNORMAL HIGH (ref 80.0–98.0)
HX MONO #: 0.9 10*3/uL — ABNORMAL HIGH (ref 0.2–0.8)
HX MONO: 12 %
HX MPV: 10.4 fL (ref 9.1–11.7)
HX NEUT #: 5 10*3/uL (ref 1.5–7.5)
HX NRBC #: 0 10*3/uL
HX NUCLEATED RBC: 0 %
HX PLT: 222 10*3/uL (ref 150–400)
HX RBC BLOOD COUNT: 5.01 M/uL (ref 4.20–5.50)
HX RDW: 14.2 % (ref 11.5–14.5)
HX SEG NEUT: 65 %
HX WBC: 7.7 10*3/uL (ref 4.0–11.0)

## 2020-01-23 LAB — HX DIABETES: HX GLUCOSE: 173 mg/dL — ABNORMAL HIGH (ref 70–139)

## 2020-01-23 LAB — HX MICRO-RESP VIRAL PANEL: HX COVID-19 (SARS-COV-2) ADMIT SCREEN: NEGATIVE

## 2020-01-23 LAB — HX CHEM-PANELS
HX ANION GAP: 10 (ref 3–14)
HX BLOOD UREA NITROGEN: 32 mg/dL — ABNORMAL HIGH (ref 6–24)
HX CHLORIDE (CL): 105 meq/L (ref 98–110)
HX CO2: 25 meq/L (ref 20–30)
HX CREATININE (CR): 1.34 mg/dL — ABNORMAL HIGH (ref 0.57–1.30)
HX GFR, AFRICAN AMERICAN: 68 mL/min/{1.73_m2}
HX GFR, NON-AFRICAN AMERICAN: 59 mL/min/{1.73_m2} — ABNORMAL LOW
HX GLUCOSE: 173 mg/dL — ABNORMAL HIGH (ref 70–139)
HX POTASSIUM (K): 3.9 meq/L (ref 3.6–5.1)
HX SODIUM (NA): 140 meq/L (ref 135–145)

## 2020-01-23 LAB — HX CHEM-OTHER
HX CALCIUM (CA): 8.9 mg/dL (ref 8.5–10.5)
HX MAGNESIUM: 1.8 mg/dL (ref 1.6–2.6)
HX PHOSPHORUS: 3.9 mg/dL (ref 2.7–4.5)

## 2020-01-23 LAB — HX POINT OF CARE
HX GLUCOSE-POCT: 115 mg/dL (ref 70–139)
HX GLUCOSE-POCT: 232 mg/dL — ABNORMAL HIGH (ref 70–139)
HX GLUCOSE-POCT: 99 mg/dL (ref 70–139)

## 2020-01-23 NOTE — Progress Notes (Signed)
* * *      Arthur Young, Arthur Young **DOB:** [birth year] (54 yo M) **Acc No.** [id number] **DOS:**  01/23/2020    ---       Arthur Young**    ------    39 Y old Male, DOB: 1965/09/19    [address], [place], [place] [address]    Home: [phone]    Provider: Resource, Appointment        * * *    Telephone Encounter    ---    Answered by  Jessie Foot Date: 01/23/2020       Time: 04:45 PM    Caller  [facility] - internal medicine    ------            Reason  fu voiding trial/catheter removal            Message                     Hello,            In patient being discharged tomorrow, needs FU for voiding trial and catheter removal. Pager is [phone] or cell # [phone].            thanks,                Action Taken                     [facility]  01/23/2020 4:47:25 PM >      Morris Petty,Briana  01/24/2020 8:44:03 AM > Where can I book this pt?      Lettsome,Paulette  01/24/2020 10:20:28 AM > Hello, [facility] called with an update. The number for the rehab pt is being discharged to is [phone].      Brazaitis,Rachel  01/24/2020 10:22:36 AM > Additional note: [facility] says the rehab he is being sent to has a phone number of [phone]. Thank you      FROINT DESK:      I called rehab . book patient for wed 09/22/@ 9am for voiding trial .      Manigat,Sophia  01/25/2020 3:50:12 PM > Pt appt was schedule as resquested..                    * * *                ---          * * *         Provider: Resource, Appointment 01/23/2020    ---    Note generated by e[employer] EMR/PM Software ([email or web address])

## 2020-01-24 LAB — HX CHEM-PANELS
HX ANION GAP: 11 (ref 3–14)
HX BLOOD UREA NITROGEN: 32 mg/dL — ABNORMAL HIGH (ref 6–24)
HX CHLORIDE (CL): 103 meq/L (ref 98–110)
HX CO2: 24 meq/L (ref 20–30)
HX CREATININE (CR): 1.36 mg/dL — ABNORMAL HIGH (ref 0.57–1.30)
HX GFR, AFRICAN AMERICAN: 67 mL/min/{1.73_m2}
HX GFR, NON-AFRICAN AMERICAN: 58 mL/min/{1.73_m2} — ABNORMAL LOW
HX GLUCOSE: 146 mg/dL — ABNORMAL HIGH (ref 70–139)
HX POTASSIUM (K): 4 meq/L (ref 3.6–5.1)
HX SODIUM (NA): 138 meq/L (ref 135–145)

## 2020-01-24 LAB — HX HEM-ROUTINE
HX BASO #: 0.1 10*3/uL (ref 0.0–0.2)
HX BASO: 1 %
HX EOSIN #: 0.2 10*3/uL (ref 0.0–0.5)
HX EOSIN: 2 %
HX HCT: 49.7 % — ABNORMAL HIGH (ref 37.0–47.0)
HX HGB: 15.5 g/dL (ref 13.5–16.0)
HX IMMATURE GRANULOCYTE#: 0.2 10*3/uL — ABNORMAL HIGH (ref 0.0–0.1)
HX IMMATURE GRANULOCYTE: 2 %
HX LYMPH #: 1.4 10*3/uL (ref 1.0–4.0)
HX LYMPH: 16 %
HX MCH: 31.1 pg (ref 26.0–34.0)
HX MCHC: 31.2 g/dL — ABNORMAL LOW (ref 32.0–36.0)
HX MCV: 99.8 fL — ABNORMAL HIGH (ref 80.0–98.0)
HX MONO #: 1 10*3/uL — ABNORMAL HIGH (ref 0.2–0.8)
HX MONO: 11 %
HX MPV: 10 fL (ref 9.1–11.7)
HX NEUT #: 5.9 10*3/uL (ref 1.5–7.5)
HX NRBC #: 0 10*3/uL
HX NUCLEATED RBC: 0 %
HX PLT: 240 10*3/uL (ref 150–400)
HX RBC BLOOD COUNT: 4.98 M/uL (ref 4.20–5.50)
HX RDW: 14.2 % (ref 11.5–14.5)
HX SEG NEUT: 68 %
HX WBC: 8.8 10*3/uL (ref 4.0–11.0)

## 2020-01-24 LAB — HX DIABETES: HX GLUCOSE: 146 mg/dL — ABNORMAL HIGH (ref 70–139)

## 2020-01-24 LAB — HX CHEM-OTHER
HX CALCIUM (CA): 8.9 mg/dL (ref 8.5–10.5)
HX MAGNESIUM: 1.9 mg/dL (ref 1.6–2.6)
HX PHOSPHORUS: 4.7 mg/dL — ABNORMAL HIGH (ref 2.7–4.5)

## 2020-03-05 ENCOUNTER — Ambulatory Visit

## 2020-03-05 NOTE — Progress Notes (Signed)
* * *      Arthur, Young **DOB:** [birth year] (54 yo M) **Acc No.** [id number] **DOS:**  03/05/2020    ---        Arthur Young**    ------    77 Y old Male, DOB: 24-Jul-1965    [address] [place], [place] [address]    Home: [phone]    Provider: Resource, Appointment        * * *    Telephone Encounter    ---    Answered by    Jenness Corner    Date: 03/05/2020        Time: 02:29 PM    Caller    pt rehab    ------            Reason    FU retention            Message                      Hello, pt is currently in a Rehab that [facility] discharged him to on 01/24/20. They have unsuccessfully tried a voiding trial several times, but pt is still experiencing retention. Their Dr is recommending an in person FU with Urology.       Best number: [phone]  ext [phone] please ask for Irving Burton - on 5th floor      Thank you                 Action Taken                      Solimini,Brianna  03/05/2020 2:32:36 PM >      Nascimento,Carol  03/08/2020 10:43:55 AM > Spoke with Olegario Messier and she stated she will speak with DR. Zhuang and we will have an answer back no later than Monday. Teodoro Kil and informed her we will give her a answer by then.      Nascimento,Carol  03/11/2020 2:57:30 PM > Pt was scheduled for CY with Dr. Clemmie Krill on 03/21/20 @1pm . Olegario Messier called emily and confirmed date and time of appt                    * * *                ---          * * *          Provider: Resource, Appointment 03/05/2020    ---    Note generated by eClinicalWorks EMR/PM Software ([email or web address])

## 2020-03-21 ENCOUNTER — Ambulatory Visit: Admit: 2020-03-21 | Payer: Medicare Other

## 2020-03-21 ENCOUNTER — Ambulatory Visit: Admitting: Surgery

## 2020-03-21 NOTE — Progress Notes (Signed)
* * [name], [name] **DOB:** 1965/12/16 (54 yo M) **Acc No.** [id number] **DOS:**  03/21/2020    ---        Arthur Young**    ------    16 Y old Male, DOB: 1966/03/25, External MRN: [id number]    Account Number: [id number]    [address] [id number]    Home: [phone]    Insurance: M80 CMNWLTH CARE/ONE CARE    PCP: Berenice Primas, NP Referring: Berenice Primas, NP    Appointment Facility: [facility]        * * *    03/21/2020    Progress Notes: Augustine Radar **CHN#:** [id number]    ------    ---        **Reason for Appointment**    ---      1\. N/P for Urinary retention and Cystoscopy    ---    2\. Urinary retention    ---      **History of Present Illness**    ---    _Adult Urology_ :    This is a 54 year old male who is here at the request of Berenice Primas, NP for my  opinion regarding urinary retention.    [name] retention_ :    Mr. Lagrand has a history of urinary retention developed during his admission  to [facility] in 01/2020 for acute decompensated heart failure. Urinary retention was  diagnosed on 01/17/2020 and required placement of a foley catheter. He failed  a trial of void on 9/10 and many others in his nursing facility. He was  started on tamsulosin with concerns for BPH.    [name]    He came today for cystoscopy and another voiding trial to evaluate.    .    Patient denies gross hematuria, fever/chills, abdominal/flank pain,  urgency/frequency, weak urine stream, dysuria, appetite changes, new bony  pain, unintentional weight loss or fatigue.    [name] (Reviewed by Dr. Clemmie Krill :    .    [name] rate:    03/21/2020:    Q_Max: 1.0 mL/sec.    Q_Mean: 0.7 mL/sec.    Voided volume: 50 mL.    [name]    PVR #1:    03/21/2020: 552 mL.    03/21/2020 (repeat): >437 mL.    03/21/2020 (repeat #2): >288 mL.    Flossie Dibble Falls and Injury Prevention_ :    HPI    Have you experienced a fall in the past year? _No_    Is the patient using assistive devices such as a cane or walker?  _Yes.  Followed per hospital policy Uses a walker_    Do you need assistance with ambulation while at our facility? _No_    Interventions _assisted to exam room, provided patient with yellow socks ,  ensured environment free from obstacles and obstructions_    _Risk Assessment_ :    Is Patient a Fall Risk?    : _Yes, fall risk procedures implemented per hospital policy_      **Current Medications**    ---    Taking      * Ammonium Lactate 5 % Lotion 1 application Externally Once a day as needed for itching     ---    * Aspirin 81 MG Tablet Chewable 1 tablet Orally Once a day     ---    * Atorvastatin Calcium 20 MG Tablet 1 tablet Orally  Once a day     ---    * Bisacodyl , Notes: 10 mg suppository every 72 hrs     ---    * Brimonidine Tartrate 0.15 % Solution 1 drop to both eyes Ophthalmic Twice a day, Notes: 3x daily     ---    * carBAMazepine 200 MG Tablet 3 tablets Orally Once a day at bedtime     ---    * carBAMazepine 200 mg Tablet 2 tablets Orally in the AM     ---    * Docusate Sodium 100 MG Capsule 1 capsule Orally Twice a day     ---    * Dorzolamide HCl 2 % Solution 1 drop to both eyes Ophthalmic Twice a day     ---    * Fluticasone-Umeclidin-Vilant , Notes: 100-62.5-25 mcg/inh 2 puff in am     ---    * Furosemide 20 MG Tablet 2 tablets Orally Once a day, Notes: 40 mg once daily     ---    * Heparin Sodium (Porcine) , Notes: 5,000 unit/ml inject subcutaneously every 12 hrs     ---    * HUMALOG INJ 100/ML     ---    * hydrALAZINE HCl , Notes: 50 mg 4x daily     ---    * hydrocortisone cream , Notes: 2.5% every 8 hrs as needed     ---    * Lactulose , Notes: 20 gm/30 ml 2x daily     ---    * Mirtazapine 15 MG Tablet 1 tablet at bedtime Orally Once a day     ---    * Narcan , Notes: 4 mg/0.1 ml as needed     ---    * OLANZapine 10 MG Tablet 1 tablet Orally Once a day     ---    * Polyethylene Glycol 3350 17 GM Packet 1 as needed for constipation Orally Once a day     ---    * Prednisone , Notes: 10 mg 3 tabs  once daily     ---    * QUEtiapine Fumarate 300 MG Tablet 1 tablet at bedtime Orally Once a day, Notes: 400 mg bedtime     ---    * Sennosides 8.6 MG Tablet 1 tablet at bedtime as needed Orally Once a day, Notes: 2 tabs 2x daily     ---    * Tamsulosin HCl , Notes: 0.4 mg at bedtime     ---    Not-Taking/PRN    * Acetaminophen 500 MG Tablet 1 tablet as needed Orally every 6 hrs     ---    * Albuterol Sulfate 108 (90 Base) MCG/ACT Aerosol Powder Breath Activated 2 puff as needed Inhalation every 4 hrs     ---    * Losartan Potassium 50 MG Tablet 1 tablet Orally Once a day     ---    * metFORMIN HCl 1000 MG Tablet 1 tablet Orally Twice a day     ---    * Metoprolol Succinate ER 50 MG Tablet Extended Release 24 Hour 1.5 tablets (75 mg) Orally Once a day     ---    * Tessalon Perles 100 MG Capsule 1 capsule as needed Orally Three times a day     ---    * Trelegy Ellipta 200-62.5-25 MCG/INH Aerosol Powder Breath Activated 1 puff Inhalation Once a day     ---  Medication List reviewed and reconciled with the patient    ---      **Past Medical History**    ---      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        ---    COPD.        ---    T2DM.        ---    HTN.        ---    HLD.        ---    Schizoaffective disorder.        ---    Insomnia.        ---    Cataracts.        ---    Urinary retention.        ---      **Surgical History**    ---      No Surgical History documented.    ---      **Family History**    ---      Mother: unknown    ---    Father: unknown    ---    1 son(s) , 1 daughter(s) .    ---    No family history of heart or lung disease.    ---      **Social History**    ---    Tobacco    history: _Currently smoking Smokes 1 ppd_    Alcohol    _Denies_    Abuse/Neglect    Do you feel unsafe in your relationships? _No_    Have you ever been hit, kicked, punched or otherwise hurt by someone in the  past year? _No_   Smokes 1 PPD since age 34, 37 pack yr history of smoking    No alcohol    No drug use     Lives in a group home    From [place], moved to [place] about 30y back.    ---      **Allergies**    ---      Depakote    ---    Lithium    ---      **Hospitalization/Major Diagnostic Procedure**    ---      ADHF, COPD 12/31-05/19/2019    ---    ADHF, COPD 1/28-06/13/2019    ---      **Review of Systems**    ---    _Urology ROS_ :    Constitutional:  No fevers, chills, weight loss or general weakness  . Head:  No headache or dizziness  . Eyes:  No loss of vision or double vision  . Ears:  No hearing loss, tinnitus, otalgia, otorrhea  . Nose:  No nasal discharge  .  Throat:  No hoarseness or difficulty swallowing  . Integumentary:  No rash or  skin lesions. No changes in hair or nail character  . Lungs:  No shortness of  breath.  No new, frequent cough  . Cardiovascular:  No chest pain or  palpitations  . Gastrointestinal:  No abdominal pain, nausea, vomiting or  change in bowel movements  . Genitourinary:  No dysuria  or  urethral  discharge  . Musculoskeletal:  No joint/muscle pain or swelling  . Normal gait  and mobility  . Neurological:  No seizures, light headedness, memory loss or  numbness  . Psychiatry:  No mood or behavioral changes. No anxiety or  depression  . Endocrine:  No hirsutism, excessive hair loss/alopecia.  No  polyuria or polydipsia  . Hematologic/Lymphatic:  No easy bruising, bleeding  or enlarged lymph nodes  . Allergic/Immunologic:  No environmental or food  allergies  .          **Vital Signs**    ---    Pain scale **0** , Ht-in 62, Wt-lbs **165** , BMI  **30.18** , BP **120/81** ,  HR **101** , Temp **97.9** , BSA **1.81** , Ht-cm 157.48, Wt-kg **74.84** , Wt  Change -6 lb.      **Physical Examination**    ---    _Urology PE_ :    General Appearance:  Well-developed, well nourished, alert and cooperative,  NAD, normal secondary sexual characteristics  .    HEENT:  Normo-cephalic, atraumatic, PERRLA, EOMI, anicteric, vision grossly  intact, no nasal discharge. Sinuses, non-tender.  Oropharhynx no exudate or  lesions  .    Skin:  Normal color, texture, turgor, with no lesions, eruptions, rashes,  bruises or petechiae  .    Neck:  Supple, no masses. Normal ROM. Trachea is in midline. No thyromegaly  .    Chest  Normal AP diameter. No rib tenderness  .    Lungs:  Normal respirations, symmetric excursion with no accessory muscle use  .    Back:  no CAVT Vertebral column aligned no scoliosis or kyphosis. No masses or  tenderness  .    Cardiovascular:  RRR. No murmur, pulses are intact  .    Abdomen:  Soft, benign, non-tender, non-distended, no palpable masses, no  hepato-splenomegaly, no hernias. Bladder is not palpable  .    Extremities:  No clubbing, cyanosis or pitting edema  .    Musculo-Skeletal:  No muscle wasting, joint swelling or tenderness  .    Lymphatic:  No cervical, axillary or inguinal adenopathy  .    Neurologic:  Oriented 3x with no focal deficits  .    _GU Male_ :    Genitalia:  Normally developed male genitalia  .    Phallus:  No lesions or chordee  .    Glans:  No lesions or inflammation  .    Meatus:  No discharge  .    Scrotum:  No mass or tenderness. No hernias, hydroceles or varicoceles, no  Lymphadenopathy  .    Spermatic Cords:  No masses, induration or tenderness  .    Epididymes:  No masses, tenderness or induration  .    Testes:  Normal size and consistency. No masses, asymmetry, tenderness or  atrophy  .    Rectal:  Normal sphincter tone. No perineal, anal or rectal lesions  .    Prostate:  2+ enlarged, smooth, non-tender and symmetrical  .    Back:  No sacral [place] or dimples. No CVAT or SPT. Bladder is not palpable  .          **Assessments**    ---    1\. Urinary retention - R33.9 (Primary)    ---    2\. Indwelling Foley catheter present - Z97.8    ---    3\. Enlarged prostate - N40.0    ---      **Treatment**    ---      **1\. Urinary retention**    Notes: Pt eventually emptied all of bladder after many attempts. I have  concerns of let him go without a catheter  but he admant to go without.    I instruted him to go to ER if he is  not able to empty.    I encouraged him return next day for PVR and possible CIC learning in case he  goes into retention again.    Given he failed so many voiding trials and his enlarged intravesical prostate.  I think he is a good candidate for surgery treatment.    .    To treat his medical refractory urinary retention, the options would be  intermittent clean catheterization (CIC), indwelling catheter (urethra vs  percutaneous suprapubic tube). Regarding the surgical managements, the options  would be:    Minimally Invasive Therapies    - Transurethral needle ablation (TUNA)    - Transurethral microwave thermotherapy (TUMT)        Surgical Therapies    - Open/Robotic simple prostatectomy    - Transurethral resection of the prostate (TURP)    - Transurethral vaporization of the prostate (TUVP)    - Photoselective vaporization of the prostate (PVP)    - Transurethral holmium laser ablation of the prostate (HoLAP)    - Transurethral holmium laser enucleation of the prostate (HoLEP)    - Holmium laser resection of the prostate (HoLRP)    - Transurethral incision of the prostate (TUIP)    - Urolift, Aquablation was discussed with pt.        I had a long talk with patient regarding his bladder outlet obstruction.  Indications for transurethral surgery were discussed with the patient: severe  lower urinary tract symptoms poorly controlled with medications, recurrent  urinary tract infections, and urinary retention requiring catheterization,  bladder stones, obstructive uropathy or patient's personal preference.        Patient opted to TURP to treat his bladder outlet obstruction. I discussed  with him about the benefit and the risks of the procedure. The risks include  but are not limited to bleeding, infection, meatal stenosis, bladder or  sphincter injuries which may cause urinary incontinence or prolonged  catheterization, there could be a small  chance of bladder neck contracture  which need dilation or surgery intervention. He may experience some urge  urinary incontinence or urinary urgency/frequency which usually needs several  months to slowly resolve or need additional medications. The surgery may not  completely resolve his urinary retention in rare situation or re-obstruction  occurs in the future. He will have retrograde ejaculation without interfering  his orgasm. The risk of ED is less than 1%. The peri-operative risks also  include DVT, pneumonia, cardiac or respiratory failure.    ---        **2\. Indwelling Foley catheter present**    Notes: as above.        **3\. Enlarged prostate**    Notes: as above.        **4\. Others**    Notes: Pt changed his mind and would postpone his TURP.      **Procedures**    ---    .    Cystoscopy (male):    .    Diagnosis:    Enlarged prostate    Erythematous lesions consistent with effects of the long indwelling catheter    .    The patient was verbally informed of the cystoscopy procedure and a verbal  consent was obtained.    .    Prior to beginning the procedure, patient identity was verified, as well as  the procedure to be performed and the site. All equipment required was ready  and available. The patient was positioned appropriately.    [name]  The patients genitalia was prepared with Betadine and 10 cc's of 2% viscous  lidocaine was injected into the urethra and a penis clamp was applied. After 5  minutes the patient was placed in the supine position and the lubricated  flexible Wolf cystoscope was introduced gently through the urethra and into  the bladder. The anterior urethra appeared normal. The prostatic urethra  appeared obstructing with trilobar enlargement. The bladder wall appeared  normal with no tumors, stones or suspicious areas but some mucosal changes  consistent with catheter placement noted. Both ureteral orifice(s) were  visualized with clear efflux bilaterally. The retroflex of the scope  was  performed. There was protruding lateral lobes in bladder. Median lobe was  unremarkable. No suspicious lesions were identified at bladder neck. There was  no bleeding noted during this procedure.    .    The procedure was tolerated moderately well. A single dose of Cipro 500 mg was  administered orally.    .    Patient's bladder was filled with NS until he has strong urge to urinate. He  was instructed to void into uroflometer.    .    Q_Max: 1.0 mL/sec.    Q_Mean: 0.7 mL/sec.    Voided volume: 50 mL.    .    PVR: patient's bladder was scanned with a Verathon U/S scanner at least 3  times with volume 552 mL.    [name]    PVR #2: patient's bladder was scanned with a Verathon U/S scanner at least 3  times with volume >437 mL.    [name]    PVR #3: patient's bladder was scanned with a Verathon U/S scanner at least 3  times with volume >288 mL.    Pt try the before he left the clinic and empty all of urine. He declined  replacement of catheter any more. He is agreeable to learn CIC.    .      **Preventive Medicine**    ---      Counseling:    Smoking .    BMI Management .    ---      **Procedure Codes**    ---      52000 Cystoscopy    ---    10272 Uroflowmetry, complex    ---    53664 PVR    ---        ---      **Follow Up**    ---    F/U next day for PVR and CIC learning    **Images**          ---    ** 0001.jpg  **            ** 0003.jpg  **            ** 0005.jpg  **            ** 0002.jpg  **            Electronically signed by Augustine Radar , MD on 03/22/2020 at 07:03 AM EST    Sign off status: Completed        * * *        [facility]    [address]    [place], [place] [address]    Tel: [phone]    Fax: [phone]              * * *  Progress Note: Augustine Radar 03/21/2020    ---    Note generated by eClinicalWorks EMR/PM Software ([email or web address])

## 2020-03-21 NOTE — Progress Notes (Signed)
 .  Progress Notes  .  Patient: Arthur Young  Provider: Augustine Radar    .  DOB: April 28, 1966 Age: 54 Y Sex: Male  .  PCP: Berenice Primas  NP  Date: 03/21/2020  .  --------------------------------------------------------------------------------  .  REASON FOR APPOINTMENT  .  1. N/P for Urinary retention and Cystoscopy  .  2. Urinary retention  .  HISTORY OF PRESENT ILLNESS  .  Adult Urology:   This is a 53 year old male who is here at the request of Berenice Primas, NP for my opinion regarding urinary retention..  .  Urinary retention:   Mr. Valenza has a history of urinary retention developed during  his admission to [facility] in 01/2020 for acute decompensated heart  failure. Urinary retention was diagnosed on 01/17/2020 and  required placement of a foley catheter. He failed a trial of void  on 9/10 and many others in his nursing facility. He was started  on tamsulosin with concerns for BPH.Marland KitchenHe came today for cystoscopy  and another voiding trial to evaluate..Patient denies gross  hematuria, fever/chills, abdominal/flank pain, urgency/frequency,  weak urine stream, dysuria, appetite changes, new bony pain,  unintentional weight loss or fatigue..  .  Imaging (Reviewed by Dr. Clemmie Krill):   .Flow rate:03/21/2020:Q_Max: 1.0 mL/sec. Q_Mean: 0.7  mL/sec.Voided volume: 50 mL.Marland KitchenPVR #1:03/21/2020: 552 mL.03/21/2020  (repeat): >437 mL.03/21/2020 (repeat #2): >288 mL..  .  Ambulatory Falls and Injury Prevention:  HPI  .  [name]  Have you experienced a fall in the past year?No  Is the patient using assistive devices such as a cane or  walker?Yes. Followed per hospital policy Uses a walker  Do you need assistance with ambulation while at our facility?No  Interventionsassisted to exam room, provided patient with yellow  socks , ensured environment free from obstacles and obstructions  .  Risk Assessment:  Is Patient a Fall Risk?  .  .  :Yes, fall risk procedures implemented per hospital policy  .  CURRENT MEDICATIONS  .  Taking Ammonium Lactate 5 % Lotion  1 application Externally Once  a day as needed for itching  Taking Aspirin 81 MG Tablet Chewable 1 tablet Orally Once a day  Taking Atorvastatin Calcium 20 MG Tablet 1 tablet Orally Once a  day  Taking Bisacodyl , Notes: 10 mg suppository every 72 hrs  Taking Brimonidine Tartrate 0.15 % Solution 1 drop to both eyes  Ophthalmic Twice a day, Notes: 3x daily  Taking carBAMazepine 200 MG Tablet 3 tablets Orally Once a day at  bedtime  Taking carBAMazepine 200 mg Tablet 2 tablets Orally in the AM  Taking Docusate Sodium 100 MG Capsule 1 capsule Orally Twice a  day  Taking Dorzolamide HCl 2 % Solution 1 drop to both eyes  Ophthalmic Twice a day  Taking Fluticasone-Umeclidin-Vilant , Notes: 100-62.5-25 mcg/inh  2 puff in am  Taking Furosemide 20 MG Tablet 2 tablets Orally Once a day,  Notes: 40 mg once daily  Taking Heparin Sodium (Porcine) , Notes: 5,000 unit/ml inject  subcutaneously every 12 hrs  Taking HUMALOG INJ 100/ML  Taking hydrALAZINE HCl , Notes: 50 mg 4x daily  Taking hydrocortisone cream , Notes: 2.5% every 8 hrs as needed  Taking Lactulose , Notes: 20 gm/30 ml 2x daily  Taking Mirtazapine 15 MG Tablet 1 tablet at bedtime Orally Once a  day  Taking Narcan , Notes: 4 mg/0.1 ml as needed  Taking OLANZapine 10 MG Tablet 1 tablet Orally Once a day  Taking  Polyethylene Glycol 3350 17 GM Packet 1 as needed for  constipation Orally Once a day  Taking Prednisone , Notes: 10 mg 3 tabs once daily  Taking QUEtiapine Fumarate 300 MG Tablet 1 tablet at bedtime  Orally Once a day, Notes: 400 mg bedtime  Taking Sennosides 8.6 MG Tablet 1 tablet at bedtime as needed  Orally Once a day, Notes: 2 tabs 2x daily  Taking Tamsulosin HCl , Notes: 0.4 mg at bedtime  Not-Taking/PRN Acetaminophen 500 MG Tablet 1 tablet as needed  Orally every 6 hrs  Not-Taking/PRN Albuterol Sulfate 108 (90 Base) MCG/ACT Aerosol  Powder Breath Activated 2 puff as needed Inhalation every 4 hrs  Not-Taking/PRN Losartan Potassium 50 MG Tablet 1 tablet  Orally  Once a day  Not-Taking/PRN metFORMIN HCl 1000 MG Tablet 1 tablet Orally Twice  a day  Not-Taking/PRN Metoprolol Succinate ER 50 MG Tablet Extended  Release 24 Hour 1.5 tablets (75 mg) Orally Once a day  Not-Taking/PRN Tessalon Perles 100 MG Capsule 1 capsule as needed  Orally Three times a day  Not-Taking/PRN Trelegy Ellipta 200-62.5-25 MCG/INH Aerosol Powder  Breath Activated 1 puff Inhalation Once a day  Medication List reviewed and reconciled with the patient  .  PAST MEDICAL HISTORY  .  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  ALLERGIES  .  Depakote  Lithium  .  SURGICAL HISTORY  .  No Surgical History documented.  [name]  FAMILY HISTORY  .  Mother: unknown  Father: unknown  1 son(s) , 1 daughter(s) .  No family history of heart or lung disease.  .  SOCIAL HISTORY  .  .  Tobacco  history:Currently smoking Smokes 1 ppd  .  [name]  Alcohol  Denies  .  [name]  Abuse/Neglect  Do you feel unsafe in your relationships?No  Have you ever been hit, kicked, punched or otherwise hurt by  someone in the past year? No  .  Smokes 1 PPD since age 2, 37 pack yr history of smokingNo  alcohol No drug use Lives in a group home From [place], moved to  [place] about 30y back.  .  HOSPITALIZATION/MAJOR DIAGNOSTIC PROCEDURE  .  ADHF, COPD 12/31-05/19/2019  ADHF, COPD 1/28-06/13/2019  .  REVIEW OF SYSTEMS  .  Urology ROS:  .  Constitutional:    No fevers, chills, weight loss or general  weakness . Head:    No headache or dizziness . Eyes:    No loss  of vision or double vision . Ears:    No hearing loss, tinnitus,  otalgia, otorrhea . Nose:    No nasal discharge . Throat:    No  hoarseness or difficulty swallowing . Integumentary:    No rash  or skin lesions. No changes in hair or nail character . Lungs:     No shortness of breath. No new, frequent cough . Cardiovascular:     No chest pain or palpitations . Gastrointestinal:    No  abdominal pain, nausea,  vomiting or change in bowel movements .  Genitourinary:    No dysuria or urethral discharge .  Musculoskeletal:    No joint/muscle pain or swelling. Normal gait  and mobility . Neurological:    No seizures, light headedness,  memory loss or numbness . Psychiatry:    No mood or behavioral  changes. No anxiety or depression . Endocrine:  No hirsutism,  excessive hair loss/alopecia. No polyuria or polydipsia .  Hematologic/Lymphatic:    No easy bruising, bleeding or enlarged  lymph nodes . Allergic/Immunologic:    No environmental or food  allergies .  [name]  VITAL SIGNS  .  Pain scale 0, Ht-in 62, Wt-lbs 165, BMI 30.18, BP 120/81, HR 101,  Temp 97.9, BSA 1.81, Ht-cm 157.48, Wt-kg 74.84, Wt Change -6 lb.  .  PHYSICAL EXAMINATION  .  Urology PE:  General Appearance:  Well-developed, well nourished, alert and  cooperative, NAD, normal secondary sexual characteristics.  HEENT:  Normo-cephalic, atraumatic, PERRLA, EOMI, anicteric,  vision grossly intact, no nasal discharge. Sinuses, non-tender.  Oropharhynx no exudate or lesions.  Skin:  Normal color, texture, turgor, with no lesions, eruptions,  rashes, bruises or petechiae.  Neck:  Supple, no masses. Normal ROM. Trachea is in midline. No  thyromegaly.  Chest  Normal AP diameter. No rib tenderness.  Lungs:  Normal respirations, symmetric excursion with no  accessory muscle use.  Back:  no CAVT Vertebral column aligned no scoliosis or kyphosis.  No masses or tenderness.  Cardiovascular:  RRR. No murmur, pulses are intact.  Abdomen:  Soft, benign, non-tender, non-distended, no palpable  masses, no hepato-splenomegaly, no hernias. Bladder is not  palpable.  Extremities:  No clubbing, cyanosis or pitting edema.  Musculo-Skeletal:  No muscle wasting, joint swelling or  tenderness.  Lymphatic:  No cervical, axillary or inguinal adenopathy.  Neurologic:  Oriented 3x with no focal deficits.  GU Male:  Genitalia:  Normally developed male genitalia.  Phallus:  No lesions or  chordee.  Glans:  No lesions or inflammation.  Meatus:   No discharge.  Scrotum:  No mass or tenderness. No hernias, hydroceles or  varicoceles, no Lymphadenopathy.  Spermatic Cords:  No masses, induration or tenderness.  Epididymes:  No masses, tenderness or induration.  Testes:  Normal size and consistency. No masses, asymmetry,  tenderness or atrophy.  Rectal:  Normal sphincter tone. No perineal, anal or rectal  lesions.  Prostate:  2+ enlarged, smooth, non-tender and symmetrical.  Back:  No sacral Chaparral or dimples. No CVAT or SPT. Bladder is not  palpable.  .  ASSESSMENTS  .  Urinary retention - R33.9 (Primary)  .  Indwelling Foley catheter present - Z97.8  .  Enlarged prostate - N40.0  .  TREATMENT  .  Urinary retention  Notes: Pt eventually emptied all of bladder after many attempts.  I have concerns of let him go without a catheter but he admant to  go without.  I instruted him to go to ER if he is not able to empty.  I encouraged him return next day for PVR and possible CIC  learning in case he goes into retention again.  Given he failed so many voiding trials and his enlarged  intravesical prostate. I think he is a good candidate for surgery  treatment.  .  To treat his medical refractory urinary retention, the options  would be intermittent clean catheterization (CIC), indwelling  catheter (urethra vs percutaneous suprapubic tube). Regarding the  surgical managements, the options would be:  Minimally Invasive Therapies  - Transurethral needle ablation (TUNA)  - Transurethral microwave thermotherapy (TUMT)  .  Surgical Therapies  - Open/Robotic simple prostatectomy  - Transurethral resection of the prostate (TURP)  - Transurethral vaporization of the prostate (TUVP)  - Photoselective vaporization of the prostate (PVP)  - Transurethral holmium laser ablation of the prostate (HoLAP)  - Transurethral holmium  laser enucleation of the prostate (HoLEP)  - Holmium laser resection of the prostate (HoLRP)  -  Transurethral incision of the prostate (TUIP)  - Urolift, Aquablation was discussed with pt.  .  I had a long talk with patient regarding his bladder outlet  obstruction. Indications for transurethral surgery were discussed  with the patient: severe lower urinary tract symptoms poorly  controlled with medications, recurrent urinary tract infections,  and urinary retention requiring catheterization, bladder stones,  obstructive uropathy or patient's personal preference.  .  Patient opted to TURP to treat his bladder outlet obstruction. I  discussed with him about the benefit and the risks of the  procedure. The risks include but are not limited to bleeding,  infection, meatal stenosis, bladder or sphincter injuries which  may cause urinary incontinence or prolonged catheterization,  there could be a small chance of bladder neck contracture which  need dilation or surgery intervention. He may experience some  urge urinary incontinence or urinary urgency/frequency which  usually needs several months to slowly resolve or need additional  medications. The surgery may not completely resolve his urinary  retention in rare situation or re-obstruction occurs in the  future. He will have retrograde ejaculation without interfering  his orgasm. The risk of ED is less than 1%. The peri-operative  risks also include DVT, pneumonia, cardiac or respiratory  failure.  .  .  Indwelling Foley catheter present  Notes: as above.  .  .  Enlarged prostate  Notes: as above.  .  .  Others  Notes: Pt changed his mind and would postpone his TURP.  [name]  PROCEDURES  .  Marland KitchenCystoscopy (male):.Diagnosis:Enlarged  prostateErythematous lesions consistent with effects of the long  indwelling catheter .The patient was verbally informed of the  cystoscopy procedure and a verbal consent was obtained. . Prior  to beginning the procedure, patient identity was verified, as  well as the procedure to be performed and the site. All equipment  required was ready  and available. The patient was positioned  appropriately..The patients genitalia was prepared with Betadine  and 10 cc's of 2% viscous lidocaine was injected into the urethra  and a penis clamp was applied. After 5 minutes the patient was  placed in the supine position and the lubricated flexible Wolf  cystoscope was introduced gently through the urethra and into the  bladder. The anterior urethra appeared normal. The prostatic  urethra appeared obstructing with trilobar enlargement. The  bladder wall appeared normal with no tumors, stones or suspicious  areas but some mucosal changes consistent with catheter placement  noted. Both ureteral orifice(s) were visualized with clear efflux  bilaterally. The retroflex of the scope was performed. There was  protruding lateral lobes in bladder. Median lobe was  unremarkable. No suspicious lesions were identified at bladder  neck. There was no bleeding noted during this procedure..The  procedure was tolerated moderately well. A single dose of Cipro  500 mg was administered orally..Patient's bladder was filled with  NS until he has strong urge to urinate. He was instructed to void  into uroflometer..Q_Max: 1.0 mL/sec. Q_Mean: 0.7 mL/sec.Voided  volume: 50 mL..PVR: patient's bladder was scanned with a Verathon  U/S scanner at least 3 times with volume 552 mL.Marland KitchenPVR #2:  patient's bladder was scanned with a Verathon U/S scanner at  least 3 times with volume >437 mL.Marland KitchenPVR #3: patient's bladder was  scanned with a Verathon U/S scanner at least 3 times with volume  >288 mL.Pt try the before  he left the clinic and empty all of  urine. He declined replacement of catheter any more. He is  agreeable to learn CIC..  .  PREVENTIVE MEDICINE  .  Counseling:  .  Smoking   .  BMI Management   .  [name]  PROCEDURE CODES  .  52000 Cystoscopy  .  91478 Uroflowmetry, complex  .  29562 PVR  .  FOLLOW UP  .  F/U next day for PVR and CIC learning  .  Electronically signed by Augustine Radar , MD  on  03/22/2020 at 07:03 AM EST  .  Document electronically signed by Augustine Radar    .

## 2020-03-22 ENCOUNTER — Emergency Department: Admit: 2020-03-22 | Discharge: 2020-03-23 | Disposition: A | Discharge status: Home / Self Care | Payer: Medicare Other

## 2020-03-22 ENCOUNTER — Emergency Department
Admit: 2020-03-22 | Disposition: A | Discharge status: Home / Self Care | Source: Ambulatory Visit | Attending: Emergency Medicine | Admitting: Emergency Medicine

## 2020-03-22 ENCOUNTER — Ambulatory Visit: Admitting: Emergency Medicine

## 2020-03-22 NOTE — ED Provider Notes (Signed)
.  .  Name: Arthur Young, Arthur Young  MRN: [id number]  Age: 54 yrs  Sex: Male  DOB: 13-Sep-1965  Arrival Date: 03/22/2020  Arrival Time: 23:52  Account#: [id number]  Bed A10  PCP: Berenice Primas  Chief Complaint: Urinary Retention  .  Presentation:  11/12  23:53 Presenting complaint: EMS states: Urinary Retention.            [id number]  23:53 Method Of Arrival: EMS: [facility]  23:53 Acuity: Adult 3                                                 jf16  11/13  00:03 Transfer From: Other [facility].                     jf16  .  Historical:  - Allergies:  11/12  23:53 Depakote;                                                       JG85  23:53 Lithium Carbonate;                                              UD43  - PMHx:  23:53 Diabetes-NIDDM; Hypertension; heart failures; COPD;             jf16        Schizophrenia; dysarthria;  .  .  .  Vital Signs:  11/13  00:06 BP 102 / 69; Pulse 90; Resp 20; Temp 35.8; Pulse Ox 92% on R/A; jf16  .  Glasgow Coma Score:  00:34 Eye Response: spontaneous(4). Verbal Response: oriented(5).     hb4        Motor Response: obeys commands(6). Total: 15.  .  Triage Assessment:  00:04 General: Appears.                                               TC05  00:06 Neuro: Eye opening: Spontaneously Level on consciousness:       jf16        Limited Attention. Cardiovascular: No deficits noted.        Respiratory: No deficits noted. GU: Parent/caregiver report the        patient having inability to void.  .  Assessment:  00:34 General: Appears in no apparent distress, well nourished, well  hb4        groomed, Behavior is appropriate for age, cooperative. Pain:        Complains of pain in abdomen and pelvis. Neuro: No deficits        noted. EENT: No deficits noted. Cardiovascular: No deficits        noted. Respiratory: Airway is patent Trachea midline  Respiratory effort is even, unlabored, Respiratory pattern is        regular, symmetrical. GI: No deficits noted. GU: Last void  was  .  Name:Arthur Young, Arthur Young  [id number]  [id number]  Page 1 of 3  %%PAGE  .  Name: Arthur Young, Arthur Young  MRN: [id number]  Age: 29 yrs  Sex: Male  DOB: November 12, 1965  Arrival Date: 03/22/2020  Arrival Time: 23:52  Account#: [id number]  Bed A10  PCP: Berenice Primas  Chief Complaint: Urinary Retention  .        March 22, 2020. at 23:00. States that despite urinating at        2300 he does not feel as though he completely emptied his        bladder. Bladder scan was performed and showed over 700 ml of        urine. Skin: No deficits noted. Musculoskeletal: No deficits        noted.  03:04 Reassessment: Awaiting ambulance for discharge.                 hb4  .  Observations:  11/12  23:52 Patient arrived in ED.                                          jf16  23:53 Triage Completed.                                               OI37  23:57 Patient Visited By: Milly Jakob                              sh31  11/13  00:53 Registration completed.                                         bs  00:53 Patient Visited By: Elba Barman                             bs  .  Procedure:  00:33 Urinalysis w/Reflex to Culture Sent.                            hb4  00:33 BUN (Blood Urea Nitrogen) Sent.                                 hb4  00:34 CBC/Diff (With Plt) Sent.                                       hb4  00:34 CR (Creatinine) Sent.                                           hb4  00:34 GLU (Glucose) Sent.  hb4  00:34 LYTES (Na, K, Cl, Co2) Sent.                                    hb4  00:36 Foley catheter Indications for placement: Acute urinary         hb4        retention, Maintenance: Is the catheter properly securedquestion Yes        Has the closed drainage system been interrupted for bladder        irrigation and/or any other reasonquestion No Is the drainage tubing        and bag below the level of the bladderquestion Yes Foley cath inserted        14 Fr. Balloon inflated. To gravity drainage.  Urine specimen        collected. returned clear yellow urine. Patient tolerated well.  .  Dispensed Medications:  01:30 Drug: Cefpodoxime (Vantin) 200 mg Route: PO;                    hb4  .  .  Interventions:  00:13 Outside Patient Records Scanned into Chart                      rk1  00:43 POC Test Accucheck POC is 131 RN notified of POC results.       af23  00:45 EMS Sheet Scanned into Chart                                    rk1  01:57 Demo Sheet Scanned into Chart                                   rk1  .  Outcome:  .  Name:Arthur Young, Arthur Young  [id number]  [id number]  Page 2 of 3  %%PAGE  .  Name: Arthur Young, Arthur Young  MRN: [id number]  Age: 48 yrs  Sex: Male  DOB: 09/29/1965  Arrival Date: 03/22/2020  Arrival Time: 23:52  Account#: [id number]  Bed A10  PCP: Berenice Primas  Chief Complaint: Urinary Retention  .  01:32 Discharge ordered by MD.                                        sh31  07:23 Discharged to nursing home. Report called to [facility].         hb4        Condition: stable Condition: improved. Discharge instructions        given to EMS, Instructed on: discharge instructions, follow up        and referral plans. Demonstrated understanding of instructions.        Discharge Assessment: Patient awake, alert and oriented x 3. No        cognitive and/or functional deficits noted. Patient verbalized        understanding of disposition instructions. Chart Status Nursing        note complete and electronically signed.  07:24 Patient left the ED.  hb4  .  Signatures:  Soohoo, Bernice                         Reg  bs  Leo Grosser                           Sec  rk1  Lenora Boys                           RN   jf16  Milly Jakob                          PA-C sh31  Kendal Hymen                         RN   hb4  Doreen Salvage                           CCT  TG28  .  .  .  .  .  .  .  .  .  .  .  .  .  .  .  .  .  .  .  .  .  .  .  .  .  Name:Arthur Young,  Arthur Young  [id number]  [id number]  Page 3 of 3  .  %%END

## 2020-03-22 NOTE — ED Provider Notes (Signed)
 Arthur Young  Name: Arthur Young, Arthur Young  MRN: [id number]  Age: 54 yrs  Sex: Male  DOB: 08-Jun-1965  Arrival Date: 03/22/2020  Arrival Time: 23:52  Account#: [id number]  .  Working Diagnosis:  - Urinary retention,  UTI/ Urinary tract infection, sit  e not specified  PCP: Berenice Primas  .  HPI:  11/14  60:46 54 year old male with history of HFrEF (30%), COPD not on home  sh31        O2, HTN, DM, schizoaffective disorder presenting from his group        home via EMS for evaluation of urinary retention. The patient        states he's been having issues with urinary retention, had a        Foley catheter placed and saw [facility] in clinic        yesterday. The patient states he passed a voiding trauma and        had the Foley catheter removed. However, today he has had        minimal urine output and feels that he is retaining urine        again. He reports mild discomfort at the suprapubic region. No        other palpable pain or flank pain. No fevers or chills. No        nausea or vomiting. He denies any dysuria or hematuria..  .  Historical:  - Allergies: Depakote; Lithium Carbonate;  - PMHx: Diabetes-NIDDM; Hypertension; heart failures; COPD;    Schizophrenia; dysarthria;  .  .  ROS:  03:12 Constitutional: Negative for chills, fever.                     sh31  03:12 Cardiovascular: Negative for chest pain, edema.  03:12 Respiratory: Negative for shortness of breath.  03:12 Abdomen/GI: Negative for abdominal pain, nausea, vomiting.  03:12 Back: Negative for costovertebral angle tenderness.  03:12 GU: Positive for difficulty urinating, Negative for urinary        frequency, hematuria, flank pain, foul smelling urine.  03:12 Allergy/Immunology: Positive for allergies.  .  Vital Signs:  00:06 BP 102 / 69; Pulse 90; Resp 20; Temp 35.8; Pulse Ox 92% on R/A; jf16  .  Glasgow Coma Score:  00:34 Eye Response: spontaneous(4). Verbal Response: oriented(5).     hb4        Motor Response: obeys commands(6). Total: 15.  .  Exam:  03:12  Constitutional:  Adult male laying supine in bed. He appears    sh31        comfortable. No acute distress Head/Face:  Normocephalic,        atraumatic. Eyes:  Conjunctiva and sclera are non-icteric and        not injected.  ENT:  Mask in place Neck:  Trachea midline. Full  .  Name:Arthur Young  [id number]  [id number]  Page 1 of 5  %%PAGE  .  Name: Arthur Young  MRN: [id number]  Age: 39 yrs  Sex: Male  DOB: 23-Nov-1965  Arrival Date: 03/22/2020  Arrival Time: 23:52  Account#: [id number]  .  Working Diagnosis:  - Urinary retention,  UTI/ Urinary tract infection, sit  e not specified  PCP: Berenice Primas  .        ROM Respiratory:  Lungs have equal breath sounds bilaterally,        clear to auscultation.  No rales, rhonchi or wheezes noted.  No  increased work of breathing, no retractions or nasal flaring.        Cardiovascular:  Regular rate and rhythm with a normal S1 and        S2.  No gallops, murmurs, or rubs.   Abdomen/GI:  Soft,        nondistended.  No tenderness throughout all four quadrants. No        rebound tenderness, rigidity, or guarding.  Back:   No        costovertebral tenderness.  Full range of motion. Skin:  Warm,        dry with normal turgor.  MS/ Extremity:    Full, normal range        of motion. Neuro:  Awake and alert, GCS 15. Moving all four        extremities, grossly intact  .  MDM:  03:15 ED course: 54 year old male with history of HFrEF (30%), COPD   sh31        not on home O2, HTN, DM, schizoaffective disorder presenting        from his group home via EMS for evaluation of urinary retention        which began today. He had previous issues with urinary        retention and had his Foley catheter removed in urology clinic        yesterday. Physical exam with abdomen soft and nontender. No        CVA tenderness. Labs with no leukocytosis. Creatinine 1.49,        near baseline. Bladder scan with >700cc urine in the bladder.        Foley catheter was placed by nursing with adequate  drainage of        urine. UA was sent, with many bacteria, 13 WBCs, trace        leukocyte esterase, negative nitrites. Considering recent        retention decision was made to cover for possible UTI with a        course of cefpodoxime, initial dose given in the ED. He is        stable for discharge back to his group home, report was given        to group home staff. He was advised to follow up with urology        next week. Return precautions were discussed.Arthur Young  03:15 Data reviewed: lab test result(s), nurses notes, old medical    sh31        records, vital signs. Counseling: I had a detailed discussion        with the patient and/or guardian regarding: the historical        points, exam findings, and any diagnostic results supporting        the discharge diagnosis, need for followup, to return to the        emergency department if symptoms worsen or persist or if there        are any questions or concerns that arise at home, Specialist        follow up. My portion of the ED chart is complete /        electronically signed: Shayan Hashmi PA-C.  .  11/13  00:09 Order name: BUN (Blood Urea Nitrogen); Complete Time: 01:11     sh31  11/13  .  Name:Sabater, Willford  [id number]  [id number]  Page 2 of 5  %%PAGE  .  Name: [name],  Young  MRN: [id number]  Age: 41 yrs  Sex: Male  DOB: Nov 02, 1965  Arrival Date: 03/22/2020  Arrival Time: 23:52  Account#: [id number]  .  Working Diagnosis:  - Urinary retention,  UTI/ Urinary tract infection, sit  e not specified  PCP: Berenice Primas  .  00:09 Order name: CBC/Diff (With Plt); Complete Time: 00:53           sh31  11/13  00:09 Order name: CR (Creatinine); Complete Time: 01:11               sh31  11/13  00:09 Order name: GLU (Glucose); Complete Time: 01:11                 sh31  11/13  00:09 Order name: LYTES (Na, K, Cl, Co2); Complete Time: 01:11        sh31  11/13  00:09 Order name: Urinalysis w/Reflex to Culture; Complete Time: 00:53sh31  11/13  00:09 Order name: Foley to gravity: if  retaining urine; Complete      sh31        Time: 00:33  11/13  00:12 Order name: AccuCheck(POC); Complete Time: 00:42                sh31  11/13  00:44 Order name: Glucose, Point of Care; Complete Time: 00:53        dispa  t  11/13  00:51 Order name: Urine Microscopic; Complete Time: 00:53             dispa  t  11/13  01:01 Order name: GFR, AA; Complete Time: 01:11                       dispa  t  11/13  01:01 Order name: GFR, NAA; Complete Time: 01:11                      dispa  t  .  Dispensed Medications:  01:30 Drug: Cefpodoxime (Vantin) 200 mg Route: PO;                    hb4  .  .  Attending Notes:  00:17 Attestation: Assessment and care plan reviewed with             jcp1/  st20        resident/midlevel provider. See their note for details.        Physician Assistant's history reviewed, patient interviewed and        examined. I have reviewed Nurses Notes.  00:18 Attending HPI: HPI: 54 y/o male pt w PMHx NIDDM, HTN, CHF,      jcp1/  st20        COPD, Schizophrenia presents via EMS for evaluation of urinary        retention. Pt reports having urinary retention in the past for        which he was seen here. He states he last urinated one hour        ago, but that it was just dribbling. He now reports lower        abdominal pain. Otherwise denies fevers, other acute complaints.  00:18 Attending ROS Abdomen/GI: Positive for abdominal pain, GU:      jcp1/  st20        Positive for difficulty urinating, All other systems are        negative.  00:18 Attending Exam: My personal exam reveals Constitutional:  jcp1/  st20  .  Name:Asbill, Constantine  [id number]  [id number]  Page 3 of 5  %%PAGE  .  Name: Ricke, Kimoto  MRN: [id number]  Age: 17 yrs  Sex: Male  DOB: 1965/09/07  Arrival Date: 03/22/2020  Arrival Time: 23:52  Account#: [id number]  .  Working Diagnosis:  - Urinary retention,  UTI/ Urinary tract infection, sit  e not specified  PCP: Berenice Primas  .        Well-nourished, well-developed, appears stated age  Eyes: No        conjunctival injection, symmetrical eyelids Neck: Symmetric,        trachea midline Respiratory: Unlabored respiratory effort        Musculoskeletal: normocephalic, atraumatic, extremities without        deformity GI: soft, nondistended, mildly tender in the        suprapubic area. Skin: Warm, dry, no rashes or lesion Psych:        Awake, alert and oriented. Appropriate mood and effect.  01:41 Lab/Ancillary show: Labs were reviewed and interpreted by me:   jcp1/  st20        Sugar 144, BUN 33, Creatinine 1.49, Hgb 17.2, HCT 53.0, U        glucose 1+, U protein 3+, U leukocyte est trace, U WBC 14, U        bacteria many.  01:41 ED Course: Pt presents via EMS for evaluation of urinary        jcp1/  st20        retention. On my exam pt was mildly tender in the suprapubic        region. Initial bladder scan showed 761 mL of urine in the        bladder. UA consistent with UTI. Pt was given Cefpodoxime and a        Foley Catheter was successfully placed with resolution of        abdominal discomfort. Pt will be discharged in stable condition.  01:41 My Working Impression: 1. UTI 2. Urinary Retention. Scribe      jcp1/  st20        Scribe Chart Complete I Adria Devon, personally scribed the        services as above for Dr. Lorine Bears on 03/22/2020.  Arthur Young  Disposition:  03:15 My portion of the ED chart is complete / electronically signed: sh31        Shayan Hashmi, PA-C.  Arthur Young  Disposition Summary:  03/23/20 01:32  Discharge Ordered        Location: Other                                                 sh31        Problem: new                                                    sh31        Symptoms: have improved  sh31        Condition: Stable                                               sh31        Diagnosis          - Urinary retention                                           sh31          - UTI/ Urinary tract infection, site not specified            sh31        Followup:                                                        sh31          - With: [facility]          - When: Next week          - Reason: Continuance of care        Discharge Instructions:          - Discharge Summary Sheet                                     sh31          - BLADDER INFECTION, Male (Adult)                             sh31  .  Name:Kurek, Khaleb  [id number]  [id number]  Page 4 of 5  %%PAGE  .  Name: Giavanni, Odonovan  MRN: [id number]  Age: 1 yrs  Sex: Male  DOB: March 19, 1966  Arrival Date: 03/22/2020  Arrival Time: 23:52  Account#: [id number]  .  Working Diagnosis:  - Urinary retention,  UTI/ Urinary tract infection, sit  e not specified  PCP: Edyth Gunnels, Amy  .          - URINARY RETENTION, Male                                     sh31        Forms:          - Medication Reconciliation Form                              sh31          - Fax Summary                                                 sh31        Prescriptions:          -  cefpodoxime 200 mg Oral Tablet              - take 1 tablet by ORAL route every 12 hours for 10 days  sh31        with food; 20 tablet; Refills: 0, Product Selection Permitted  Signatures:  Dispatcher, [employer]                          dispa  Soohoo, [place]                         Reg  bs  Lenora Boys                           RN   jf16  Milly Jakob                          PA-C sh31  Kendal Hymen                         RN   hb4  Cordelia Pen, Scribe                  [phone]  .  Corrections: (The following items were deleted from the chart)  00:32 00:09 Other Nurse Order ordered. sh31                           sh31  03:62 36:39 54 year old male with history of HFrEF (30%), COPD not on sh31        home O2, HTN, DM, schizoaffective disorder presenting from his        group home via EMS for evaluation of urinary retention. The        patient states he's been having issues with urinary retention,        had a Foley catheter placed and saw an outside urologist         yesterday. The patient states he passed a voiding trauma and        had the Foley catheter removed. However, today he has had        minimal urine output and feels that he is retaining urine        again. He reports mild discomfort at the suprapubic region. No        other palpable pain or flank pain. No fevers or chills. No        nausea or vomiting. He denies any dysuria or hematuria.. sh31  03:17 03:15 ED course: 54 year old male with history of HFrEF (30%),  sh31        COPD not on home O2, HTN, DM, schizoaffective disorder        presenting from his group home via EMS for evaluation of        urinary retention which began today. He had previous issues        with urinary retention and had his Foley catheter removed in        urology clinic yesterday. Physical exam with abdomen soft and        nontender. No CVA tenderness. Labs with. sh31  .  Document is preliminary until electronically or manually signed by the atte  nding physician  .  .  .  .  .  Name:Clayson,  Jestin  [id number]  [id number]  Page 5 of 5  .  %%END

## 2020-03-23 LAB — HX HEM-ROUTINE
HX BASO #: 0.1 10*3/uL (ref 0.0–0.2)
HX BASO: 1 %
HX EOSIN #: 0.2 10*3/uL (ref 0.0–0.5)
HX EOSIN: 2 %
HX HCT: 53 % — ABNORMAL HIGH (ref 37.0–47.0)
HX HGB: 17.2 g/dL — ABNORMAL HIGH (ref 13.5–16.0)
HX IMMATURE GRANULOCYTE#: 0.1 10*3/uL (ref 0.0–0.1)
HX IMMATURE GRANULOCYTE: 1 %
HX LYMPH #: 1.6 10*3/uL (ref 1.0–4.0)
HX LYMPH: 18 %
HX MCH: 31.6 pg (ref 26.0–34.0)
HX MCHC: 32.5 g/dL (ref 32.0–36.0)
HX MCV: 97.2 fL (ref 80.0–98.0)
HX MONO #: 0.8 10*3/uL (ref 0.2–0.8)
HX MONO: 9 %
HX MPV: 9.4 fL (ref 9.1–11.7)
HX NEUT #: 6.3 10*3/uL (ref 1.5–7.5)
HX NRBC #: 0 10*3/uL
HX NUCLEATED RBC: 0 %
HX PLT: 177 10*3/uL (ref 150–400)
HX RBC BLOOD COUNT: 5.45 M/uL (ref 4.20–5.50)
HX RDW: 14 % (ref 11.5–14.5)
HX SEG NEUT: 70 %
HX WBC: 9 10*3/uL (ref 4.0–11.0)

## 2020-03-23 LAB — HX BF-URINALYSIS
HX HYALINE CAST: 1.1 /LPF
HX KETONES: NEGATIVE mg/dL
HX NITRITE LEVEL: NEGATIVE
HX RED BLOOD CELLS: 1 /HPF
HX SPECIFIC GRAVITY: 1.009
HX SQUAMOUS EPITHELIAL CELLS: NEGATIVE /HPF
HX U BILIRUBIN: NEGATIVE
HX U BLOOD: NEGATIVE
HX U PH: 6.5
HX U UROBILINIG: 0.2 EU
HX WHITE BLOOD CELLS: 13 /HPF — AB

## 2020-03-23 LAB — HX CHEM-PANELS
HX ANION GAP: 10 (ref 3–14)
HX BLOOD UREA NITROGEN: 33 mg/dL — ABNORMAL HIGH (ref 6–24)
HX CHLORIDE (CL): 100 meq/L (ref 98–110)
HX CO2: 30 meq/L (ref 20–30)
HX CREATININE (CR): 1.49 mg/dL — ABNORMAL HIGH (ref 0.57–1.30)
HX GFR, AFRICAN AMERICAN: 60 mL/min/{1.73_m2}
HX GFR, NON-AFRICAN AMERICAN: 52 mL/min/{1.73_m2} — ABNORMAL LOW
HX GLUCOSE: 144 mg/dL — ABNORMAL HIGH (ref 70–139)
HX POTASSIUM (K): 4.4 meq/L (ref 3.6–5.1)
HX SODIUM (NA): 140 meq/L (ref 135–145)

## 2020-03-23 LAB — HX DIABETES: HX GLUCOSE: 144 mg/dL — ABNORMAL HIGH (ref 70–139)

## 2020-03-23 LAB — HX POINT OF CARE: HX GLUCOSE-POCT: 131 mg/dL (ref 70–139)

## 2020-03-30 LAB — UNMAPPED LAB RESULTS: Creatinine, urine, random (INT/EXT): 70.8 mg/dL

## 2020-04-08 ENCOUNTER — Ambulatory Visit

## 2020-04-10 ENCOUNTER — Other Ambulatory Visit: Payer: Self-pay

## 2020-04-10 ENCOUNTER — Ambulatory Visit (INDEPENDENT_AMBULATORY_CARE_PROVIDER_SITE_OTHER): Payer: Managed Care, Other (non HMO) | Admitting: Registered Nurse

## 2020-04-10 ENCOUNTER — Encounter: Payer: Self-pay | Admitting: Registered Nurse

## 2020-04-10 VITALS — BP 144/92 | HR 76 | Temp 98.7°F | Resp 18 | Ht 66.0 in | Wt 199.6 lb

## 2020-04-10 DIAGNOSIS — Z0000 Encounter for general adult medical examination without abnormal findings: Secondary | ICD-10-CM | POA: Diagnosis not present

## 2020-04-10 DIAGNOSIS — Z1159 Encounter for screening for other viral diseases: Secondary | ICD-10-CM | POA: Diagnosis not present

## 2020-04-10 DIAGNOSIS — Z1211 Encounter for screening for malignant neoplasm of colon: Secondary | ICD-10-CM

## 2020-04-10 DIAGNOSIS — Z8349 Family history of other endocrine, nutritional and metabolic diseases: Secondary | ICD-10-CM

## 2020-04-10 NOTE — Patient Instructions (Signed)
° ° ° °  If you have lab work done today you will be contacted with your lab results within the next 2 weeks.  If you have not heard from us then please contact us. The fastest way to get your results is to register for My Chart. ° ° °IF you received an x-ray today, you will receive an invoice from [facility]. Please contact [facility] at [phone] with questions or concerns regarding your invoice.  ° °IF you received labwork today, you will receive an invoice from [facility]. Please contact [facility] at [phone] with questions or concerns regarding your invoice.  ° °Our billing staff will not be able to assist you with questions regarding bills from these companies. ° °You will be contacted with the lab results as soon as they are available. The fastest way to get your results is to activate your My Chart account. Instructions are located on the last page of this paperwork. If you have not heard from us regarding the results in 2 weeks, please contact this office. °  ° ° ° °

## 2020-04-12 LAB — COMPREHENSIVE METABOLIC PANEL
ALT: 33 IU/L (ref 0–44)
AST: 26 IU/L (ref 0–40)
Albumin/Globulin Ratio: 1.5 (ref 1.2–2.2)
Albumin: 4.7 g/dL (ref 3.8–4.9)
Alkaline Phosphatase: 65 IU/L (ref 44–121)
BUN/Creatinine Ratio: 17 (ref 9–20)
BUN: 13 mg/dL (ref 6–24)
Bilirubin Total: 0.5 mg/dL (ref 0.0–1.2)
CO2: 24 mmol/L (ref 20–29)
Calcium: 9.7 mg/dL (ref 8.7–10.2)
Chloride: 100 mmol/L (ref 96–106)
Creatinine, Ser: 0.75 mg/dL — ABNORMAL LOW (ref 0.76–1.27)
GFR calc Af Amer: 119 mL/min/{1.73_m2} (ref >59)
GFR calc non Af Amer: 103 mL/min/{1.73_m2} (ref >59)
Globulin, Total: 3.1 g/dL (ref 1.5–4.5)
Glucose: 98 mg/dL (ref 65–99)
Potassium: 4.2 mmol/L (ref 3.5–5.2)
Sodium: 138 mmol/L (ref 134–144)
Total Protein: 7.8 g/dL (ref 6.0–8.5)

## 2020-04-12 LAB — LIPID PANEL
Chol/HDL Ratio: 5.2 ratio — ABNORMAL HIGH (ref 0.0–5.0)
Cholesterol, Total: 237 mg/dL — ABNORMAL HIGH (ref 100–199)
HDL: 46 mg/dL (ref >39)
LDL Chol Calc (NIH): 168 mg/dL — ABNORMAL HIGH (ref 0–99)
Triglycerides: 129 mg/dL (ref 0–149)
VLDL Cholesterol Cal: 23 mg/dL (ref 5–40)

## 2020-04-12 LAB — CBC WITH DIFFERENTIAL/PLATELET
Basophils Absolute: 0.1 10*3/uL (ref 0.0–0.2)
Basos: 1 %
EOS (ABSOLUTE): 0.2 10*3/uL (ref 0.0–0.4)
Eos: 3 %
Hematocrit: 47.3 % (ref 37.5–51.0)
Hemoglobin: 15.3 g/dL (ref 13.0–17.7)
Immature Grans (Abs): 0 10*3/uL (ref 0.0–0.1)
Immature Granulocytes: 0 %
Lymphocytes Absolute: 1.6 10*3/uL (ref 0.7–3.1)
Lymphs: 27 %
MCH: 29.6 pg (ref 26.6–33.0)
MCHC: 32.3 g/dL (ref 31.5–35.7)
MCV: 92 fL (ref 79–97)
Monocytes Absolute: 0.4 10*3/uL (ref 0.1–0.9)
Monocytes: 7 %
Neutrophils Absolute: 3.7 10*3/uL (ref 1.4–7.0)
Neutrophils: 62 %
Platelets: 248 10*3/uL (ref 150–450)
RBC: 5.17 x10E6/uL (ref 4.14–5.80)
RDW: 11.7 % (ref 11.6–15.4)
WBC: 6 10*3/uL (ref 3.4–10.8)

## 2020-04-12 LAB — VITAMIN B12: Vitamin B-12: 786 pg/mL (ref 232–1245)

## 2020-04-12 LAB — TSH: TSH: 1.03 u[IU]/mL (ref 0.450–4.500)

## 2020-04-12 LAB — HBCIGM: Hep B C IgM: NEGATIVE

## 2020-04-12 LAB — HEMOGLOBIN A1C
Est. average glucose Bld gHb Est-mCnc: 117 mg/dL
Hgb A1c MFr Bld: 5.7 % — ABNORMAL HIGH (ref 4.8–5.6)

## 2020-04-12 LAB — VITAMIN D 25 HYDROXY (VIT D DEFICIENCY, FRACTURES): Vit D, 25-Hydroxy: 16.5 ng/mL — ABNORMAL LOW (ref 30.0–100.0)

## 2020-04-12 LAB — HEPATITIS B CORE AB W/REFLEX: Hep B Core Total Ab: POSITIVE — AB

## 2020-04-12 LAB — HEPATITIS C ANTIBODY: Hep C Virus Ab: <0.1 s/co ratio (ref 0.0–0.9)

## 2020-04-22 ENCOUNTER — Encounter: Payer: Self-pay | Admitting: Registered Nurse

## 2020-04-22 NOTE — Progress Notes (Signed)
Established Patient Office Visit  Subjective:  Patient ID: Thomas Santiago, male    DOB: 1966/04/10  Age: 54 y.o. MRN: [id number]  CC:  Chief Complaint  Patient presents with  . Transitions Of Care    Patient states he is here for TOC. Per patient he also would like to get an CPE.    HPI Thomas Santiago presents for CPE and labs  No acute concerns or complaints  Requesting Vit D testing dt fam hx  Due for colonoscopy  Past Medical History:  Diagnosis Date  . Allergy     Past Surgical History:  Procedure Laterality Date  . ESOPHAGOGASTRODUODENOSCOPY (EGD) WITH PROPOFOL Left 09/12/2016   Procedure: ESOPHAGOGASTRODUODENOSCOPY (EGD) WITH PROPOFOL;  Surgeon: Dorena Cookey, MD;  Location: [facility] ENDOSCOPY;  Service: Endoscopy;  Laterality: Left;  . HERNIA REPAIR     L side 2005    History reviewed. No pertinent family history.  Social History   Socioeconomic History  . Marital status: Married    Spouse name: Not on file  . Number of children: 2  . Years of education: Not on file  . Highest education level: Not on file  Occupational History  . Occupation: Event organiser  Tobacco Use  . Smoking status: Never Smoker  . Smokeless tobacco: Never Used  Vaping Use  . Vaping Use: Never used  Substance and Sexual Activity  . Alcohol use: Yes    Comment: weekends 1-2 drinks  . Drug use: No  . Sexual activity: Yes    Partners: Female  Other Topics Concern  . Not on file  Social History Narrative   Married with 2 children.  From [place] 1998   Social Determinants of Health   Financial Resource Strain: Not on [employer] Insecurity: Not on file  Transportation Needs: Not on file  Physical Activity: Not on file  Stress: Not on file  Social Connections: Not on file  Intimate Partner Violence: Not on file    Outpatient Medications Prior to Visit  Medication Sig Dispense Refill  . fexofenadine (ALLEGRA) 30 MG tablet Take 30 mg by mouth 2 (two) times daily.  (Patient not taking: Reported on 04/10/2020)    . fluticasone (FLONASE) 50 MCG/ACT nasal spray Place 2 sprays into both nostrils daily. (Patient not taking: Reported on 04/10/2020) 16 g 2   No facility-administered medications prior to visit.    No Known Allergies  ROS Review of Systems  Constitutional: Negative.   HENT: Negative.   Eyes: Negative.   Respiratory: Negative.   Cardiovascular: Negative.   Gastrointestinal: Negative.   Genitourinary: Negative.   Musculoskeletal: Negative.   Skin: Negative.   Neurological: Negative.   Psychiatric/Behavioral: Negative.   All other systems reviewed and are negative.     Objective:    Physical Exam Constitutional:      General: He is not in acute distress.    Appearance: Normal appearance. He is normal weight. He is not ill-appearing, toxic-appearing or diaphoretic.  Cardiovascular:     Rate and Rhythm: Normal rate and regular rhythm.     Heart sounds: Normal heart sounds. No murmur heard. No friction rub. No gallop.   Pulmonary:     Effort: Pulmonary effort is normal. No respiratory distress.     Breath sounds: Normal breath sounds. No stridor. No wheezing, rhonchi or rales.  Chest:     Chest wall: No tenderness.  Neurological:     General: No focal deficit present.  Mental Status: He is alert and oriented to person, place, and time. Mental status is at baseline.  Psychiatric:        Mood and Affect: Mood normal.        Behavior: Behavior normal.        Thought Content: Thought content normal.        Judgment: Judgment normal.     BP (!) 144/92   Pulse 76   Temp 98.7 F (37.1 C) (Temporal)   Resp 18   Ht 5\' 6"  (1.676 m)   Wt 199 lb 9.6 oz (90.5 kg)   SpO2 100%   BMI 32.22 kg/m  Wt Readings from Last 3 Encounters:  04/10/20 199 lb 9.6 oz (90.5 kg)  08/04/17 195 lb 12.8 oz (88.8 kg)  09/14/16 200 lb 9.9 oz (91 kg)     Health Maintenance Due  Topic Date Due  . COLONOSCOPY  Never done  . COVID-19 Vaccine  (3 - Booster for Pfizer series) 02/22/2020    There are no preventive care reminders to display for this patient.  Lab Results  Component Value Date   TSH 1.030 04/10/2020   Lab Results  Component Value Date   WBC 6.0 04/10/2020   HGB 15.3 04/10/2020   HCT 47.3 04/10/2020   MCV 92 04/10/2020   PLT 248 04/10/2020   Lab Results  Component Value Date   NA 138 04/10/2020   K 4.2 04/10/2020   CO2 24 04/10/2020   GLUCOSE 98 04/10/2020   BUN 13 04/10/2020   CREATININE 0.75 (L) 04/10/2020   BILITOT 0.5 04/10/2020   ALKPHOS 65 04/10/2020   AST 26 04/10/2020   ALT 33 04/10/2020   PROT 7.8 04/10/2020   ALBUMIN 4.7 04/10/2020   CALCIUM 9.7 04/10/2020   ANIONGAP 5 09/14/2016   Lab Results  Component Value Date   CHOL 237 (H) 04/10/2020   Lab Results  Component Value Date   HDL 46 04/10/2020   Lab Results  Component Value Date   LDLCALC 168 (H) 04/10/2020   Lab Results  Component Value Date   TRIG 129 04/10/2020   Lab Results  Component Value Date   CHOLHDL 5.2 (H) 04/10/2020   Lab Results  Component Value Date   HGBA1C 5.7 (H) 04/10/2020      Assessment & Plan:   Problem List Items Addressed This Visit   None   Visit Diagnoses    Screening for colon cancer    -  Primary   Relevant Orders   Ambulatory referral to Gastroenterology   Routine general medical examination at a health care facility       Relevant Orders   Lipid panel (Completed)   TSH (Completed)   Comprehensive metabolic panel (Completed)   CBC with Differential (Completed)   Hemoglobin A1c (Completed)   Family history of vitamin D deficiency       Relevant Orders   Vitamin D, 25-hydroxy (Completed)   Vitamin B12 (Completed)   Encounter for screening for other viral diseases       Relevant Orders   Hep B Core Ab W/Reflex (Completed)   Hepatitis C Antibody (Completed)      No orders of the defined types were placed in this encounter.   Follow-up: No follow-ups on file.    PLAN  Labs collected  Exam unremarkable  Refer for colonoscopy  Patient encouraged to call clinic with any questions, comments, or concerns.  14/05/2019, NP

## 2020-05-20 LAB — LIPID PROFILE (EXT)
Cholesterol (EXT): 158 mg/dL (ref 0–200)
HDL Cholesterol (EXT): 49 mg/dL (ref 40–60)
LDL Cholesterol (EXT): 78 mg/dL (ref 0.0–99.0)
Triglycerides (EXT): 186 mg/dL — ABNORMAL HIGH (ref 15–150)

## 2020-05-21 DIAGNOSIS — N1831 Chronic kidney disease, stage 3a: Secondary | ICD-10-CM

## 2020-05-23 ENCOUNTER — Telehealth: Payer: Self-pay | Admitting: *Deleted

## 2020-05-23 NOTE — Telephone Encounter (Signed)
Thomas Santiago is needing Rich to go over his labs and let him know if he wants him to go on any medication.    Please print out labs once reviewed and mail to patient.  His wife [phone] the same.   If medication is needed for either please call Daughter Lucille Passy) [phone]  Thank You So much

## 2020-05-24 NOTE — Telephone Encounter (Signed)
Please assist with the lab notes.

## 2020-05-30 NOTE — Telephone Encounter (Signed)
Left message for patient to call back  

## 2020-07-01 ENCOUNTER — Encounter: Payer: Self-pay | Admitting: Registered Nurse

## 2020-07-01 DIAGNOSIS — R768 Other specified abnormal immunological findings in serum: Secondary | ICD-10-CM

## 2020-07-04 ENCOUNTER — Observation Stay
Admission: EM | Admit: 2020-07-04 | Discharge: 2020-07-16 | Disposition: A | Discharge status: Home / Self Care | Payer: Medicare Other | Source: Intra-hospital

## 2020-07-04 ENCOUNTER — Emergency Department
Admit: 2020-07-04 | Disposition: A | Discharge status: Other Acute Inpatient Hospital | Source: Ambulatory Visit | Attending: Emergency Medicine | Admitting: Emergency Medicine

## 2020-07-04 ENCOUNTER — Inpatient Hospital Stay
Admit: 2020-07-04 | Disposition: A | Discharge status: Home / Self Care | Source: Emergency Department | Attending: Pulmonary Disease | Admitting: Pulmonary Disease

## 2020-07-04 LAB — HX BF-URINALYSIS
HX KETONES: NEGATIVE mg/dL
HX LEUKOCYTE ES: NEGATIVE
HX NITRITE LEVEL: NEGATIVE
HX RED BLOOD CELLS: 11 /HPF
HX SQUAMOUS EPITHELIAL CELLS: NEGATIVE /HPF
HX U BACTERIA: NEGATIVE
HX U BILIRUBIN: NEGATIVE
HX U BLOOD: NEGATIVE
HX U GLUCOSE: NEGATIVE mg/dL
HX WHITE BLOOD CELLS: 11 /HPF

## 2020-07-04 LAB — HX CHEM-BLOODGAS: HX FIO2: 100100

## 2020-07-04 LAB — HX HEM-ROUTINE

## 2020-07-04 LAB — HX CHEM-LFT

## 2020-07-04 LAB — HX CHEM-PANELS: HX ANION GAP: 1111 (ref 3–14)

## 2020-07-04 LAB — HX DIABETES

## 2020-07-04 LAB — HX COAGULATION

## 2020-07-04 LAB — HX MICRO-RESP VIRAL PANEL: HX COVID-19 (SARS-COV-2) RAPID: NEGATIVE

## 2020-07-04 LAB — HX POINT OF CARE

## 2020-07-04 LAB — HX CHEM-ENZ-FRAC

## 2020-07-04 LAB — HX TRANSFUSION

## 2020-07-04 NOTE — Progress Notes (Signed)
 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  Chest CT revealed: consolidative opacity with surrounding  patchy groundglass opacities involving the entire left lower lobe).  Hospitalization complicated by toxic-metabolic encephalopathy (ddx: sepsis vs.  uremia vs. missed psychiatric medications) and tenuous cardiopulmonary status.        The patient is being followed for ongoing assessment of oropharyngeal swallow  mechanism to determine risk of aspiration and potential to resume a PO diet.  Previous evaluation revealed an impaired oropharyngeal swallow mechanism  negatively impacted by altered mental status/fluctuating level of alertness  with recommendation to remain NPO except for ice chips, sips of water and  essential medication with puree as tolerated.  Per discussion with NSG,  patient more lethargic/alerted today compared to previous encounter.  Upon  entry, patient lying in bed breathing comfortably on supplemental oxygen via  HFNC aroused by verbal cues.  Patient noted to be more lethargic requiring  verbal cues to maintain adequate level of alertness with decreased  intelligibility.  Conservative PO trials administered by SLP followed by  medication administration by NSG.        **Exam:**    _Current Status:_    Communicative: verbal, paucity of speech; decreased intelligibility,  nonsensical content    Cognitive: impaired at baseline, able to follow simple commands to  participate, repetition provided as needed    Behavioral: initially lethargic aroused by verbal cues,  pleasantly  confused/resolving delirium, fluctuating level of alertness requiring verbal  cues, weak/deconditioned, passive participation    Hearing: responsive to voice    Nutrition: NPO except ice chips, sips of water and essential medication with  puree    Respiratory: SpO2 92% FiO2 40% RR mid 20s, no observed distress        _Oral Motor Examination:_    Dentition: poor quality; reduced quantity    Head and neck: symmetrical orofacial structures; HFNC in place    Lingual function: protrusion midline    Labial function: symmetrical at rest and activation    Oral mucosa: dry appearing    Palate: not evaluated    Voice: low intensity, clear sounding    Cough to command: not elicited        _Clinical Swallow Evaluation_ :    Swallowing was evaluated with the following consistencies: thin liquids,  puree, medication administration        ORAL PHASE:    Labial seal: adequate for utensil stripping and straw seal given direct  placement, no anterior loss of bolus    Mastication: not evaluated    Intraoral residue: none observed        PHARYNGEAL PHASE:    Pharyngeal initiation: present, subjectively delayed    Laryngeal elevation: reduced to palpation    Repeated Swallows: 1-2x/swallows per bolus    Post-prandial cough or throat clearing: 1x coughing episode with thin liquids  via straw sip, none observed on remainder    Post-prandial vocal quality: clear, unchanged to phonation following coughing  episode        **Impression** :  The patient continues to present with an impaired  oropharyngeal swallow mechanism negatively impacted by altered mental  status/fluctuating level of alertness.  Oropharyngeal swallow mechanism c/b  aforementioned deficits listed above.  Given patient's clinical presentation  and ongoing evidence of oropharyngeal dysphagia, the patient remains at a  heightened risk for aspiration and subsequent medical complications; resuming  a PO diet remains contraindicated at this time.  Consider  re-establishing  alternate means of nutrition, hydration and medication at the discretion of  primary team.  It is reasonable to allow ice chips, sips of water and  essential medication with puree as tolerated while maintaining aspiration  precautions to provide oral comfort, strengthen pharyngeal musculature and  prevent additional non-use atrophy.  Will continue to follow for ongoing  assessment of oropharyngeal swallow mechanism to determine ability to safely  resume a PO diet.        **Recommendations** :    1\. NPO except ice chips, sips of water and essential medication with puree as  tolerated              -  Upright 90 degrees           -  Only offer when awake/alert and engaging           -  Ensure stable respiratory status prior to PO intake           -  Withhold given overt s/s of aspiration or decline in mental/respiratory status       2\. Consider re-establishing alternate means of nutrition, hydration and  medication at the discretion of primary team    3\. Oral care with moisture 2-3x/shift    4\. Will follow 3-5x/week for ongoing assessment of oropharyngeal swallow  mechanism        Case discussed with patient and NSG; primary team paged with recommendations.        Please page with questions or concerns.        Orlinda Blalock, MS, CCC-SLP    Department of Speech-Language Pathology    Pager: [phone] or [facility] Text                **Electronically signed by Orlinda Blalock, CCC-SLP on 07/09/2020 12:37**

## 2020-07-04 NOTE — Progress Notes (Signed)
 Supervisory Note For Resident I performed a history and physical examination  of the patient and discussed the management with the resident. I reviewed the  resident's note and agree with the documented findings and plan of care. Yes,    Additional Notes    LATE entry: below represents my 07/16/20 morning assessment, prior to DC        Feels very well, ready to go home        AF, 84, 104/76, 94% on RA    NAD, Chest with distant but clear BS, Cor RRR, no Edema        Cr 2.02 (slightly down), yesterday K 5.2 > 6.1 (PML) >>> now 4.3 this am, Mg  2.6        asa, lipitor    budesonide, duoneb, prednisone 30    metoprolol 12.5 2x    hep subq    lantus    ISS    carbamazepine, remeron, olanzipine, seroquel    flomax        Assessment    Acute on chronic hypox and hypercapneic resp failure 2/2 COPD exacerbation    Acute on chronic HFrEF    HYperkalemia        Plan    PULM: improving. continues on COPD regimen with ICS/SABA/SAMA. Prednisone  taper currently at 40, plan to drop by 10 every 2-3 days    CV: Acute on chronic heart failure with reduced EFclinically euvolumic - on  oral diuretic regimen at present. Uptitrated BB today to 25 2x, restart home  hydral in place of ACEi 2/2 high K    RENAL: AKI improving, K improved - has h/o intolerance of ACEi and  Spironolactone 2/2 high K - we stopped ACEi and restarted Hydralazine (home  med).    Psych: Home remeron/zyprexa/seroquel, last QTC 477.    ENDO: appreciate glycemia svc recommendations for DC regimen        Stable to DC.            **Electronically signed by Wanita Chamberlain, MD on 07/16/2020 18:04**

## 2020-07-04 NOTE — Progress Notes (Signed)
 **Subjective**    Subjective Overnight:    - Blood pressures 145-160/55-65 (MAP 80-90s) overnight, so uptitrated AM  hydralazine to 75mg  tid given hx HFrEF    - Stable on nasal cannula all night with repeat AM gas 7.38/60/81/34    - Persistent hypernatremia, so increased FWF. SLP will re-evaluate today -  can consider D5 if unable to pass SLP eval        Interval:    - This morning patient appears more alert, awake, reports feeling hungry    - Denies any chest pain or shortness of breath, no abdominal pain.    **Subjective**    Subjective Overnight:    - Blood pressures 145-160/55-65 (MAP 80-90s) overnight, so uptitrated AM  hydralazine to 75mg  tid given hx HFrEF    - Stable on nasal cannula all night with repeat AM gas 7.38/60/81/34    - Persistent hypernatremia, so increased FWF. SLP will re-evaluate today -  can consider D5 if unable to pass SLP eval        Interval:    - This morning patient appears more alert, awake, reports feeling hungry    - Denies any chest pain or shortness of breath, no abdominal pain.    **Vital Signs**    Vital Signs    07/12/2020 03:39              -  Weight: 64.3kg      07/11/2020 20:56              -  O2 Amount: 6.0 LPM          -  Morse Fall Risk Total: 15      07/11/2020 11:01              -  How Obtained: Bed Scale      CFS Vital Signs CS    07/12/2020 06:59              -  Shift Intake Total: 1444.73ml          -  Shift Output Total:          -  Shift Balance: 479.92ml      07/12/2020 06:00              -  Pain: 0          -  Riker: 4 - Calm, awakens easily,follows commands          -  Heart Rate: 104          -  Cardiac Rhythm: NSR - Normal Sinus Rhythm          -  BP Systolic: 126          -  BP Diastolic: 81          -  MAP: 94          -  BP Site: Right Arm          -  BP Position: Lying          -  BP Source: Arterial          -  Respirations: 28          -  SPO2 %: 94 (89-100)          -  O2 Amount: 2.0 LPM          -  Mode:  Nasal Cannula          -  Call Alvester Morin  Within Reach: Yes          -  High Fall Risk Interventions: Yes          -  Repositioned Side: Left          -  HOB Elevation: 30          -  Enteral Feeding: Nutren 1.0          -  Enteral Feeding Rate: 60cc/hr          -  IV Infusion 1: Normal Saline (KVO/Meds)          -  IV Dose 1: 5 ml/hr          -  ml/hour 1: 5 ml/hr          -  IV Infusion 2: Dexmedetomidine 450mcg/100ml          -  IV Dose 2: off          -  ml/hour 2: off      07/12/2020 04:00              -  Temp Calc: 36.90          -  Temp Site: Oral          -  Oral Hygiene Q4: Yes      07/11/2020 22:00              -  Comment: PRN hydral given for BP >180      07/11/2020 19:00              -  Foley care per protocol: Yes      07/11/2020 14:22              -  Comment: TF started, advance by 20 q6. Goal 70      07/11/2020 08:00              -  Medication Name 1: Acetazolamide       07/11/2020 06:00              -  FiO2: 35          -  Set Rate: 14          -  TV: 470          -  PS: 10          -  Peep: 5      07/11/2020 04:00              -  BP Systolic-2: 137          -  BP Diastolic-2: 52          -  MAP-2: 75          -  Site-2: Right Arm          -  Position-2: Lying          -  Source-2: Arterial        **Vital Signs**    Vital Signs    07/12/2020 03:39              -  Weight: 64.3kg      07/11/2020 20:56              -  O2 Amount: 6.0 LPM          -  Morse Fall Risk Total: 15      07/11/2020 11:01              -  How Obtained: Bed Scale      CFS Vital Signs CS    07/12/2020 06:59              -  Shift Intake Total: 1444.28ml          -  Shift Output Total:          -  Shift Balance: 479.13ml      07/12/2020 06:00              -  Pain: 0          -  Riker: 4 - Calm, awakens easily,follows commands          -  Heart Rate: 104          -  Cardiac Rhythm: NSR - Normal Sinus Rhythm          -  BP Systolic: 126          -  BP  Diastolic: 81          -  MAP: 94          -  BP Site: Right Arm          -  BP Position: Lying          -  BP Source: Arterial          -  Respirations: 28          -  SPO2 %: 94 (89-100)          -  O2 Amount: 2.0 LPM          -  Mode: Nasal Cannula          -  Call Bell Within Reach: Yes          -  High Fall Risk Interventions: Yes          -  Repositioned Side: Left          -  HOB Elevation: 30          -  Enteral Feeding: Nutren 1.0          -  Enteral Feeding Rate: 60cc/hr          -  IV Infusion 1: Normal Saline (KVO/Meds)          -  IV Dose 1: 5 ml/hr          -  ml/hour 1: 5 ml/hr          -  IV Infusion 2: Dexmedetomidine 48mcg/100ml          -  IV Dose 2: off          -  ml/hour 2: off      07/12/2020 04:00              -  Temp Calc: 36.90          -  Temp Site: Oral          -  Oral Hygiene Q4: Yes      07/11/2020 22:00              -  Comment: PRN hydral given for BP >180      07/11/2020 19:00              -  Foley care per protocol: Yes      07/11/2020 14:22              -  Comment: TF started,  advance by 20 q6. Goal 70      07/11/2020 08:00              -  Medication Name 1: Acetazolamide       07/11/2020 06:00              -  FiO2: 35          -  Set Rate: 14          -  TV: 470          -  PS: 10          -  Peep: 5      07/11/2020 04:00              -  BP Systolic-2: 137          -  BP Diastolic-2: 52          -  MAP-2: 75          -  Site-2: Right Arm          -  Position-2: Lying          -  Source-2: Arterial        **General**    Comment General: On nasal cannula, no visible distress, alert / interactive    CV: RRR with no m/r/g    Pulm: Clear lungs bilaterally, no audible rhonchi / wheezing / rales    GI: soft, NT/NT    Extremities: No pitting edema in lower extremity edema    Skin: no rashes.    **General**    Comment General: On nasal cannula, no visible distress, alert / interactive    CV: RRR with no m/r/g     Pulm: Clear lungs bilaterally, no audible rhonchi / wheezing / rales    GI: soft, NT/NT    Extremities: No pitting edema in lower extremity edema    Skin: no rashes.    **Assessment and Plan**    Medication              -   **HYDRALAZINE (APRESOLINE)** 75 MG Oral TID for 59 Days   Pending Date: 07/12/2020          -   **OLANZAPINE (ZYPREXA)** 10 MG Oral QHS for 60 Days           -   **QUETIAPINE FUMARATE (SEROQUEL)** 300 MG Oral QHS for 60 Days           -   **MIRTAZAPINE (REMERON)** 15 MG Oral QHS for 60 Days           -   **METHYLPREDNISOLONE (SOLU-MEDROL)** 60 MG Intravenous Q12H for 59 Days, Clinician Dir:IV PUSH OVER 3 -5 MINUTES           -   **CARBAMAZEPINE (TEGRETOL)** 600 MG Orogastric Tube QPM for 60 Days           -   **GUAIFENESIN SYRUP** 200 MG Oral Q4H First Dose Now           -   **CARBAMAZEPINE (TEGRETOL)** 400 MG Orogastric Tube QAM for 60 Days           -   **DOCUSATE SODIUM** 100 MG Nasogastric BID for 60 Days           -   **DEXMEDETOMIDINE 400 MCG/100 ML (PRECEDEX PREMIX)** CONTINUOUS Intravenous INFUSION for 60 Days, Clinician SAY:[id number] TO RIKER SAS 3-4 START AT 0.2 MCG/KG/HR. MAY INCREASE BY 0.1 MCG/KG/HR Q15MIN TO  A MAX OF 1.5 MCG/KG/HR.           -   **HYDRALAZINE (APRESOLINE)** 10 MG Intravenous Q6HPRN SBP > 180 mmHg for 60 Days, Clinician Dir:IV PUSH FOR ADULTS PER IV GUIDELINE           -   ** ** ** **INSULIN REGULAR HUMAN (HUMULIN R)******** Sliding Scale Subcutaneous Q6H for 60 Days      For Glucose 80 to 150 Give 0 Units    For Glucose 151 to 200 Give 2 Units    For Glucose 201 to 250 Give 3 Units    For Glucose 251 to 300 Give 4 Units    Notify Physician if: Call HO for glucose > 300          -   **BISACODYL (DULCOLAX)** 10 MG Rectal QDAYPRN Constipation for 60 Days, Clinician Dir:NOT RELIEVED BY PO           -   **SODIUM CL 0.9% FOR INH (STOCK) (SODIUM CHLORIDE NEB(STOCK))** 3 ML Inhalation Q4H for 60 Days           -   **SENNA (SENOKOT)** 17.2 MG  Oral QDAY for 60 Days           -   **ACETAMINOPHEN (TYLENOL)** 650 MG Oral Q6HPRN Pain, Fever, Clinician Dir:NOT TO EXCEED 4 G APAP PER DAY           -   **PANTOPRAZOLE (PROTONIX)** 40 MG Intravenous QDAY First Dose Now for 60 Days           -   **ATORVASTATIN (LIPITOR)** 80 MG Oral QHS for 60 Days           -   **TAMSULOSIN (FLOMAX)** 0.4 MG Oral QHS for 60 Days           -   **ASPIRIN CHEWABLE** 81 MG Oral QDAY for 60 Days           -   **DORZOLAMIDE 2% OPHTH (TRUSOPT 2% OPHTH)** 1 DROP Both Eyes SOL BID for 60 Days           -   **BRIMONIDINE 0.2% OPHTH (ALPHAGAN 0.2% OPHTH)** 1 DROP Both Eyes SOL TID for 60 Days           -   **IPRATROPIUM/ALBUTEROL SULFATE (DUONEB)** 3 ML Inhalation Q6H for 60 Days           -   **LACTULOSE (CEPHULAC)** 10 G Oral QHSPRN Constipation for 60 Days           -   **HEPARIN** 5000 UNITS Subcutaneous Q8H First Dose Now for 60 Days           -   **BISACODYL E.C. (BISACODYL)** 10 MG Oral QDAYPRN Constipation for 60 Days           -   **BUDESONIDE INHALATION (PULMICORT RESPULES)** 1 MG Inhalation BID First Dose Now for 60 Days           Comment 55 yo male with history of HFrEF (BiV dysfunction, EF 20-25% 05/2019),  COPD not on home O2, pulmonary nodules, HTN, DM, and Schizoaffective disorder  who presented for shortness of breath, found to be febrile and to have hypoxic  and hypercapnic respiratory failure concerning for primary pneumonia,  component of decompensated heart failure / COPD exacerbation.        --NEURO--    #Toxic Metabolic Encephelopathy - Improving    #Schizoaffective disorder    Reportedly baseline memory issues /  confusion. Concern during course for  gradually worsening mental status in setting of increasing hypercarbia,  managed previously w/ BiPAP etc per below. Also d/t concern for aspiration  made NPO and several home maintenance meds were hold. Now remains  intermittently delirious though improving. Intermittent agitation  previously  Precedex, was resumed overnight 3/2 - 3/3. Overal suspect primary driver of  ICU delirium.    -Cont home Carbamazepine 400mg  qAM / 600mg  qHS    -Cont home Mirtazapine 15mg  qHS    -Cont home Zyprexa 10mg  qHS and will give one time dose now     -Cont home Seroquel 300mg  qHS     - s/p precedex        --PULM/ID--    #Acute hypoxic and hypercapneic respiratory failure    #Pneumonia    #Acute exacerbation of COPD (not on home O2)    #Pulmonary Nodules    H/o COPD (last PFTS (07/21/19) FEV1 24% with FEV1/FVC 40% GOLD IV), prior  admission 01/2020 for pseudomonas pneumonia. Presented with 3 days of SOB,  fevers, and difficulty walking due / DOE. Found febrile, with leukocytosis,  and CT revealing consolidative opacity with surrounding patchy groundglass  opacities involving the entire left lower lobe concerning for pneumonia.  Likely overall acute hypoxic respiratory failure multifactorial in nature,  primary pneumonia (WBC, fever, imaging) with component of CHF exacerbation  (edematous, wt up) / COPD component (wheezing on exam, mild respiratory  acidosis). Admitted to MICU d/t high flow requirement. Started on Cefepime  (prior PSA) and Azithromycin 2/24 - 2/27) and then placed on Meropenam for 7  day course Diuresed w/ initial poor response. Also started on Prednisone for  COPD component (2/24 - 2/26) and restarted IV solumederol due to worsening  status. . Urine legionella / strep negative. Initial improvement, then with  periods of worsening hypercapnia and somnolence. Managed with up-titration of  BiPAP settings and increased steroid dose. Tolerating 2LNC this morning, ABG  overnight significantly improved 7.4/60/81/34    ---Micro:    -MRSA swab negative    -BCx 2/25 NGTD    -Urine strep / legionella negative     -Sputum unable to produce    Tx:    -Titrate supplemental oxygen as tolerated for goal SpO2 88-92%; goal stay off NIPPV if able, if rescue needed had been up to 22/7    -Continue Solumedrol 60mg   (started 3/2- ), wean q6h --> q12h-->q24hr -->likely can be changed to PO pred 3/5 and continue until 3/6     -S/p Meropenem for planned 7 day course to complete today 3/3 (had been transitioned from Cefepime d/t mental status)    -Continue standing Duonebs, Budesonide, mucolytics    -Chest PT q4hr and Saline nebs q4hr    -Follow-up chest CT in 6-8 weeks for pulmonary nodules         --CV--    #Acute on chronic systolic heart failure (LVEF 20-25% 01/2020)    #BiV dysfunction (Severely Reduced RV function)    #Type II NSTEMI 2/2 Demand Ischemia    #HLD    Known h/o systolic HF - last TTE (01/2020) w/ LVEF 20-25%, global LV  hypokinesis, RV dilation with severely reduced systolic function, 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 Initial picture of likely volume overload leading to  acute hypoxic respiratory failure. EKG without ischemic findings, trop peaked  0.12 and down-trended. BNP 737 (down from 1085 01/2020 admit), weight up 84kg  (  dry wt 75kg). Previous admits for CHF, unclear follow up (planned to f/u w/  Renne Crigler). At that time discharged on Toprol, last filled Lopressor 25mg   BID 04/2020, unclear current BB regimen ongoing vs d/c'd. Previously on  Aldactone, d/c'd d/t hyperkalemia. Planned to consider ACE-I / SGLT-2  outpatient. Diuresed in ED w/ IV Lasix 120mg  / 200mg  with poor reported  output, and subsequent hypotension. Received Albumin x1 with some improvement.  TTE 2/25 noted known findings of BiV dysfunction (EF 20-25%), RV  moderately/severely dilated and severely reduced RV, RA dilation, elevated RA  pressure, RV overload mild MR, severe TR, trace AR, moderate PR. Aggressively  diuresed with Lasix infusion, good volume output, improved respiratory status  and edema. Have since held and OUP remains brisk without intervention.    - START GDMT    --- START metoprolol 6.25 Q6    --- START captopril 6.25 TID    -Hold on further  diuresis, UOP adequate without intervention; goal even to net negative -->? consider starting PO lasix on 3/5    -Increased Hydralazine to 75mg  TID from 50 mg TID     -Would benefit from RHC +/- this admit to further evaluate ? PAH    -Consider inpatient HF consult for GDMT initation and optimization as patient did not follow up outpatient    -Continue home ASA and Atorvastatin         --RENAL--    #AKI    #Metabolic Alkaolsis    #Hyperkalemia - Resolved    #Hypernatremia    Worsening kidney function from baseline Cr 1.4. Peak Cr of 4.14 likely  secondary to cardiorenal syndrome. Cr slowly improving. Aggressively diuresed  (>9L negative) with Lasix / Diuril with improvement in respiratory status /  edema, also development of likely contraction metabolic alkalosis.  Intermittent Diamox dosing. Now with development of worsening hypernatremia.  Brisk UOP despite holding further diuresis likely represents post-ATN  autodiuresis.    -Hold on further diuresis today, goal even to net negative -->consider starting PO lasix on the floor     -Cont FWF 600 Q4 --> recheck at 1 pm, consider D5W        --GI--    #Nutrition    #Constipation    Mental status worsening in setting of hypercarbia / delirium, made NPO after  SLP eval. Small-bore DHT placed 3/2, confirmed on imaging.    -DHT in place    -Nutrition consult, start TF per team recs    -SLP team to follow for readiness    -Continue Colace; PRN Dulcolax if needed    - d/c PPI        --ENDO--    #T2DM    Last A1C 7.5 (01/2020)    -Holding home Metformin    -SSI q6h        --HEME--    H/H stable, no active issues.    -Daily CBC        Full code (confirmed)    TF    SQH    Daughter: Hulda Humphrey: [phone]    Social: Pt lives in a group home.    **Assessment and Plan**    Medication              -   **HYDRALAZINE (APRESOLINE)** 75 MG Oral TID for 59 Days   Pending Date: 07/12/2020          -   **OLANZAPINE (ZYPREXA)** 10 MG Oral QHS for 60 Days           -   **  QUETIAPINE  FUMARATE (SEROQUEL)** 300 MG Oral QHS for 60 Days           -   **MIRTAZAPINE (REMERON)** 15 MG Oral QHS for 60 Days           -   **METHYLPREDNISOLONE (SOLU-MEDROL)** 60 MG Intravenous Q12H for 59 Days, Clinician Dir:IV PUSH OVER 3 -5 MINUTES           -   **CARBAMAZEPINE (TEGRETOL)** 600 MG Orogastric Tube QPM for 60 Days           -   **GUAIFENESIN SYRUP** 200 MG Oral Q4H First Dose Now           -   **CARBAMAZEPINE (TEGRETOL)** 400 MG Orogastric Tube QAM for 60 Days           -   **DOCUSATE SODIUM** 100 MG Nasogastric BID for 60 Days           -   **DEXMEDETOMIDINE 400 MCG/100 ML (PRECEDEX PREMIX)** CONTINUOUS Intravenous INFUSION for 60 Days, Clinician GEX:[id number] TO RIKER SAS 3-4 START AT 0.2 MCG/KG/HR. MAY INCREASE BY 0.1 MCG/KG/HR Q15MIN TO A MAX OF 1.5 MCG/KG/HR.           -   **HYDRALAZINE (APRESOLINE)** 10 MG Intravenous Q6HPRN SBP > 180 mmHg for 60 Days, Clinician Dir:IV PUSH FOR ADULTS PER IV GUIDELINE           -   ** ** ** **INSULIN REGULAR HUMAN (HUMULIN R)******** Sliding Scale Subcutaneous Q6H for 60 Days      For Glucose 80 to 150 Give 0 Units    For Glucose 151 to 200 Give 2 Units    For Glucose 201 to 250 Give 3 Units    For Glucose 251 to 300 Give 4 Units    Notify Physician if: Call HO for glucose > 300          -   **BISACODYL (DULCOLAX)** 10 MG Rectal QDAYPRN Constipation for 60 Days, Clinician Dir:NOT RELIEVED BY PO           -   **SODIUM CL 0.9% FOR INH (STOCK) (SODIUM CHLORIDE NEB(STOCK))** 3 ML Inhalation Q4H for 60 Days           -   **SENNA (SENOKOT)** 17.2 MG Oral QDAY for 60 Days           -   **ACETAMINOPHEN (TYLENOL)** 650 MG Oral Q6HPRN Pain, Fever, Clinician Dir:NOT TO EXCEED 4 G APAP PER DAY           -   **PANTOPRAZOLE (PROTONIX)** 40 MG Intravenous QDAY First Dose Now for 60 Days           -   **ATORVASTATIN (LIPITOR)** 80 MG Oral QHS for 60 Days           -   **TAMSULOSIN (FLOMAX)** 0.4 MG Oral QHS for 60 Days           -   **ASPIRIN  CHEWABLE** 81 MG Oral QDAY for 60 Days           -   **DORZOLAMIDE 2% OPHTH (TRUSOPT 2% OPHTH)** 1 DROP Both Eyes SOL BID for 60 Days           -   **BRIMONIDINE 0.2% OPHTH (ALPHAGAN 0.2% OPHTH)** 1 DROP Both Eyes SOL TID for 60 Days           -   **IPRATROPIUM/ALBUTEROL SULFATE (DUONEB)** 3 ML Inhalation Q6H for 60 Days           -   **  LACTULOSE (CEPHULAC)** 10 G Oral QHSPRN Constipation for 60 Days           -   **HEPARIN** 5000 UNITS Subcutaneous Q8H First Dose Now for 60 Days           -   **BISACODYL E.C. (BISACODYL)** 10 MG Oral QDAYPRN Constipation for 60 Days           -   **BUDESONIDE INHALATION (PULMICORT RESPULES)** 1 MG Inhalation BID First Dose Now for 60 Days           Comment 55 yo male with history of HFrEF (BiV dysfunction, EF 20-25% 05/2019),  COPD not on home O2, pulmonary nodules, HTN, DM, and Schizoaffective disorder  who presented for shortness of breath, found to be febrile and to have hypoxic  and hypercapnic respiratory failure concerning for primary pneumonia,  component of decompensated heart failure / COPD exacerbation.        --NEURO--    #Toxic Metabolic Encephelopathy - Improving    #Schizoaffective disorder    Reportedly baseline memory issues / confusion. Concern during course for  gradually worsening mental status in setting of increasing hypercarbia,  managed previously w/ BiPAP etc per below. Also d/t concern for aspiration  made NPO and several home maintenance meds were hold. Now remains  intermittently delirious though improving. Intermittent agitation previously  Precedex, was resumed overnight 3/2 - 3/3. Overal suspect primary driver of  ICU delirium.    -Cont home Carbamazepine 400mg  qAM / 600mg  qHS    -Cont home Mirtazapine 15mg  qHS    -Cont home Zyprexa 10mg  qHS and will give one time dose now     -Cont home Seroquel 300mg  qHS     - s/p precedex        --PULM/ID--    #Acute hypoxic and hypercapneic respiratory failure    #Pneumonia    #Acute exacerbation of  COPD (not on home O2)    #Pulmonary Nodules    H/o COPD (last PFTS (07/21/19) FEV1 24% with FEV1/FVC 40% GOLD IV), prior  admission 01/2020 for pseudomonas pneumonia. Presented with 3 days of SOB,  fevers, and difficulty walking due / DOE. Found febrile, with leukocytosis,  and CT revealing consolidative opacity with surrounding patchy groundglass  opacities involving the entire left lower lobe concerning for pneumonia.  Likely overall acute hypoxic respiratory failure multifactorial in nature,  primary pneumonia (WBC, fever, imaging) with component of CHF exacerbation  (edematous, wt up) / COPD component (wheezing on exam, mild respiratory  acidosis). Admitted to MICU d/t high flow requirement. Started on Cefepime  (prior PSA) and Azithromycin 2/24 - 2/27) and then placed on Meropenam for 7  day course Diuresed w/ initial poor response. Also started on Prednisone for  COPD component (2/24 - 2/26) and restarted IV solumederol due to worsening  status. . Urine legionella / strep negative. Initial improvement, then with  periods of worsening hypercapnia and somnolence. Managed with up-titration of  BiPAP settings and increased steroid dose. Tolerating 2LNC this morning, ABG  overnight significantly improved 7.4/60/81/34    ---Micro:    -MRSA swab negative    -BCx 2/25 NGTD    -Urine strep / legionella negative     -Sputum unable to produce    Tx:    -Titrate supplemental oxygen as tolerated for goal SpO2 88-92%; goal stay off NIPPV if able, if rescue needed had been up to 22/7    -Continue Solumedrol 60mg  (started 3/2- ), wean q6h --> q12h-->q24hr -->likely can be  changed to PO pred 3/5 and continue until 3/6     -S/p Meropenem for planned 7 day course to complete today 3/3 (had been transitioned from Cefepime d/t mental status)    -Continue standing Duonebs, Budesonide, mucolytics    -Chest PT q4hr and Saline nebs q4hr    -Follow-up chest CT in 6-8 weeks for pulmonary nodules         --CV--    #Acute on chronic  systolic heart failure (LVEF 20-25% 01/2020)    #BiV dysfunction (Severely Reduced RV function)    #Type II NSTEMI 2/2 Demand Ischemia    #HLD    Known h/o systolic HF - last TTE (01/2020) w/ LVEF 20-25%, global LV  hypokinesis, RV dilation with severely reduced systolic function, 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 Initial picture of likely volume overload leading to  acute hypoxic respiratory failure. EKG without ischemic findings, trop peaked  0.12 and down-trended. BNP 737 (down from 1085 01/2020 admit), weight up 84kg  (dry wt 75kg). Previous admits for CHF, unclear follow up (planned to f/u w/  Renne Crigler). At that time discharged on Toprol, last filled Lopressor 25mg   BID 04/2020, unclear current BB regimen ongoing vs d/c'd. Previously on  Aldactone, d/c'd d/t hyperkalemia. Planned to consider ACE-I / SGLT-2  outpatient. Diuresed in ED w/ IV Lasix 120mg  / 200mg  with poor reported  output, and subsequent hypotension. Received Albumin x1 with some improvement.  TTE 2/25 noted known findings of BiV dysfunction (EF 20-25%), RV  moderately/severely dilated and severely reduced RV, RA dilation, elevated RA  pressure, RV overload mild MR, severe TR, trace AR, moderate PR. Aggressively  diuresed with Lasix infusion, good volume output, improved respiratory status  and edema. Have since held and OUP remains brisk without intervention.    - START GDMT    --- START metoprolol 6.25 Q6    --- START captopril 6.25 TID    -Hold on further diuresis, UOP adequate without intervention; goal even to net negative -->? consider starting PO lasix on 3/5    -Increased Hydralazine to 75mg  TID from 50 mg TID     -Would benefit from RHC +/- this admit to further evaluate ? PAH    -Consider inpatient HF consult for GDMT initation and optimization as patient did not follow up outpatient    -Continue home ASA and Atorvastatin         --RENAL--    #AKI     #Metabolic Alkaolsis    #Hyperkalemia - Resolved    #Hypernatremia    Worsening kidney function from baseline Cr 1.4. Peak Cr of 4.14 likely  secondary to cardiorenal syndrome. Cr slowly improving. Aggressively diuresed  (>9L negative) with Lasix / Diuril with improvement in respiratory status /  edema, also development of likely contraction metabolic alkalosis.  Intermittent Diamox dosing. Now with development of worsening hypernatremia.  Brisk UOP despite holding further diuresis likely represents post-ATN  autodiuresis.    -Hold on further diuresis today, goal even to net negative -->consider starting PO lasix on the floor     -Cont FWF 600 Q4 --> recheck at 1 pm, consider D5W        --GI--    #Nutrition    #Constipation    Mental status worsening in setting of hypercarbia / delirium, made NPO after  SLP eval. Small-bore DHT placed 3/2, confirmed on imaging.    -DHT in place    -  Nutrition consult, start TF per team recs    -SLP team to follow for readiness    -Continue Colace; PRN Dulcolax if needed    - d/c PPI        --ENDO--    #T2DM    Last A1C 7.5 (01/2020)    -Holding home Metformin    -SSI q6h        --HEME--    H/H stable, no active issues.    -Daily CBC        Full code (confirmed)    TF    [facility]    Daughter: Hulda Humphrey: [phone]    Social: Pt lives in a group home.            **Electronically signed by Renea Ee, MD on 07/12/2020 10:28**        **Electronically signed by Renea Ee, MD on 07/12/2020 10:28**

## 2020-07-04 NOTE — Progress Notes (Signed)
 **Subjective**    Subjective Overnight:    - 10 PM ABG: 7.33/76/97/39    - Was able to tolerate bipap 14/6 for majority of the night with AM ABG  worsened 7.26/86/109/38. Driving pressure increased to 20/8 with plans to  repeat VBG in 30 min        Interval:    - Appears still somnolent this morning, will wake up and follow commands but  becomes sleepy/somnolent 30 seconds later    - BIPAP increased to 22/7    - VBG this AM 7.25/93/116/39.    **Subjective**    Subjective Overnight:    - 10 PM ABG: 7.33/76/97/39    - Was able to tolerate bipap 14/6 for majority of the night with AM ABG  worsened 7.26/86/109/38. Driving pressure increased to 20/8 with plans to  repeat VBG in 30 min        Interval:    - Appears still somnolent this morning, will wake up and follow commands but  becomes sleepy/somnolent 30 seconds later    - BIPAP increased to 22/7    - VBG this AM 7.25/93/116/39.    **Vital Signs**    Vital Signs    07/09/2020 21:00              -  O2 Amount: 40%          -  Morse Fall Risk Total: 50      CFS Vital Signs CS    07/10/2020 06:59              -  Shift Intake Total:          -  Shift Output Total:          -  Shift Balance: -      07/10/2020 05:00              -  Heart Rate: 96          -  Cardiac Rhythm: NSR - Normal Sinus Rhythm          -  BP Systolic: 127          -  BP Diastolic: 74          -  MAP: 90          -  BP Site: Left Arm          -  BP Position: Lying          -  BP Source: NIBP          -  Respirations: 14          -  SPO2 %: 96 (89-100)          -  Mode: CPAP/BIPAP,Hi Flow          -  FiO2: 45%          -  Set Rate: 16          -  IV Infusion 1: Normal Saline (KVO/Meds)          -  IV Dose 1: 5 ml/hr          -  ml/hour 1: 5 ml/hr          -  Medication Name 1: Acetazolamide       07/10/2020 04:00              -  Temp Calc: 37.00          -  Temp Site: Axillary          -  Call Bell Within Reach: Yes      07/10/2020  00:00              -  High Fall Risk Interventions: Yes          -  Oral Hygiene Q4: Yes          -  Foley care per protocol: Yes      07/09/2020 23:00              -  Pain: 0          -  Riker: 3 - Arouses to verbal stimuli / gentle shaking      07/09/2020 18:00              -  EtCO2: 92          -  HOB Elevation: 30      07/09/2020 17:00              -  Repositioned Side: Left      07/09/2020 15:00              -  Spont TV: [id number]          -  PS: 14          -  Peep: 6      07/09/2020 14:00              -  Medication Dose 1: 500mg           -  Medication Freq 1: One Time Only      07/09/2020 08:00              -  Posterior Tibial: Right - Palpable,Left - Palpable          -  Dorsalis Pedis: Right - Palpable,Left - Palpable          -  Radial: Right - Palpable,Left - Palpable      07/09/2020 06:10              -  Comment: P&V on bed while getting nebs      07/09/2020 01:27              -  Respiratory Comments: pt acutely desatted, RN and RT in room, HFNC placed on 100% briefly, RT increased to 50L        **Vital Signs**    Vital Signs    07/09/2020 21:00              -  O2 Amount: 40%          -  Morse Fall Risk Total: 50      CFS Vital Signs CS    07/10/2020 06:59              -  Shift Intake Total:          -  Shift Output Total:          -  Shift Balance: -      07/10/2020 05:00              -  Heart Rate: 96          -  Cardiac Rhythm: NSR - Normal Sinus Rhythm          -  BP Systolic: 127          -  BP Diastolic: 74          -  MAP: 90          -  BP Site: Left Arm          -  BP Position: Lying          -  BP Source: NIBP          -  Respirations: 14          -  SPO2 %: 96 (89-100)          -  Mode: CPAP/BIPAP,Hi Flow          -  FiO2: 45%          -  Set Rate: 16          -  IV Infusion 1: Normal Saline (KVO/Meds)          -  IV Dose 1: 5 ml/hr          -  ml/hour 1: 5 ml/hr          -  Medication Name 1:  Acetazolamide       07/10/2020 04:00              -  Temp Calc: 37.00          -  Temp Site: Axillary          -  Call Bell Within Reach: Yes      07/10/2020 00:00              -  High Fall Risk Interventions: Yes          -  Oral Hygiene Q4: Yes          -  Foley care per protocol: Yes      07/09/2020 23:00              -  Pain: 0          -  Riker: 3 - Arouses to verbal stimuli / gentle shaking      07/09/2020 18:00              -  EtCO2: 92          -  HOB Elevation: 30      07/09/2020 17:00              -  Repositioned Side: Left      07/09/2020 15:00              -  Spont TV: [id number]          -  PS: 14          -  Peep: 6      07/09/2020 14:00              -  Medication Dose 1: 500mg           -  Medication Freq 1: One Time Only      07/09/2020 08:00              -  Posterior Tibial: Right - Palpable,Left - Palpable          -  Dorsalis Pedis: Right - Palpable,Left - Palpable          -  Radial: Right - Palpable,Left - Palpable      07/09/2020 06:10              -  Comment: P&V on bed while getting nebs      07/09/2020 01:27              -  Respiratory Comments: pt acutely desatted, RN and RT in room, HFNC placed on 100% briefly, RT increased to 50L        **General**    Comment General: On BIPAP; appears more somnolent but will awaken to voice    CV: RRR with no m/r/g    Pulm: Rhonchi anteriorally b/l, no wheezes    GI: soft, NT/NT    Extremities: No pitting edema in lower extremity edema, wwp, more wrinkles  than before    Skin: no rashes.    **General**    Comment General: On BIPAP; appears more somnolent but will awaken to voice    CV: RRR with no m/r/g    Pulm: Rhonchi anteriorally b/l, no wheezes    GI: soft, NT/NT    Extremities: No pitting edema in lower extremity edema, wwp, more wrinkles  than before    Skin: no rashes.    **Assessment and Plan**    Medication              -   **BISACODYL (DULCOLAX)** 10 MG Rectal QDAYPRN Constipation for 60 Days, Clinician  Dir:NOT RELIEVED BY PO           -   **DEXMEDETOMIDINE 400 MCG/100 ML (PRECEDEX PREMIX)** CONTINUOUS Intravenous INFUSION for 60 Days, Clinician OZD:[id number] TO RIKER SAS 3-4 START AT 0.2 MCG/KG/HR. MAY INCREASE BY 0.1 MCG/KG/HR Q15MIN TO A MAX OF 1.5 MCG/KG/HR.           -   **MEROPENEM (MERREM)** 500 MG Intravenous Q12H First Dose Now for 60 Days, Clinician VQQ:[id number] TO 100 ML NS MED PLUS. INFUSE FIRST DOSE OVER 30 MINUTES AND ALL SUBSEQUENT DOSES OVER 3 HOURS.           -   **SODIUM CL 0.9% FOR INH (STOCK) (SODIUM CHLORIDE NEB(STOCK))** 3 ML Inhalation Q4H for 60 Days           -   **SENNA (SENOKOT)** 17.2 MG Oral QDAY for 60 Days           -   **POLYETHYLENE GLYCOL (MIRALAX)** 17 G Oral QDAY First Dose Now for 60 Days           -   **OLANZAPINE (ZYPREXA)** 10 MG Oral QHS STAT and then Routine for 60 Days, Clinician Dir:MAX DAILY DOSE: 20 MG/DAY           -   **ACETAMINOPHEN (TYLENOL)** 650 MG Oral Q6HPRN Pain, Fever, Clinician Dir:NOT TO EXCEED 4 G APAP PER DAY           -   **PANTOPRAZOLE (PROTONIX)** 40 MG Intravenous QDAY First Dose Now for 60 Days           -   **ATORVASTATIN (LIPITOR)** 80 MG Oral QHS for 60 Days           -   **TAMSULOSIN (FLOMAX)** 0.4 MG Oral QHS for 60 Days           -   **BRIMONIDINE 0.2% OPHTH (ALPHAGAN 0.2% OPHTH)** 1 DROP Both Eyes SOL TID for 60 Days           -   **DOCUSATE SODIUM (COLACE)** 100 MG Oral BID for 60 Days           -   **DORZOLAMIDE 2% OPHTH (TRUSOPT 2% OPHTH)** 1 DROP Both Eyes SOL BID for 60 Days           -   **CARBAMAZEPINE (TEGRETOL)** 400 MG Oral QAM for 60 Days           -   **  ASPIRIN CHEWABLE** 81 MG Oral QDAY for 60 Days           -   ** ** ** **INSULIN LISPRO (HUMALOG) (HUMALOG)******** Sliding Scale Subcutaneous TIDWM for 60 Days      For Glucose 80 to 150 Give 0 Units    For Glucose 151 to 200 Give 2 Units    For Glucose 201 to 250 Give 3 Units    For Glucose 251 to 300 Give 4 Units    Notify Physician if: Call HO for  glucose > 300          -   **IPRATROPIUM/ALBUTEROL SULFATE (DUONEB)** 3 ML Inhalation Q6H for 60 Days           -   **GUAIFENESIN SYRUP** 200 MG Oral Q4H First Dose Now for 60 Days           -   **MIRTAZAPINE (REMERON)** 15 MG Oral QHS First Dose Now for 60 Days           -   **QUETIAPINE FUMARATE (SEROQUEL)** 400 MG Oral QHS First Dose Now for 60 Days           -   **CARBAMAZEPINE (TEGRETOL)** 600 MG Oral QPM First Dose Now for 60 Days           -   **LACTULOSE (CEPHULAC)** 10 G Oral QHSPRN Constipation for 60 Days           -   **HEPARIN** 5000 UNITS Subcutaneous Q8H First Dose Now for 60 Days           -   **BISACODYL E.C. (BISACODYL)** 10 MG Oral QDAYPRN Constipation for 60 Days           -   **BUDESONIDE INHALATION (PULMICORT RESPULES)** 1 MG Inhalation BID First Dose Now for 60 Days           Comment 55 yo male with history of HFrEF (BiV dysfunction, EF 20-25% 05/2019),  COPD not on home O2, pulmonary nodules, HTN, DM, and Schizoaffective disorder  who presented for shortness of breath, found to be febrile and to have hypoxic  and hypercapnic respiratory failure concerning for primary pneumonia,  component of decompensated heart failure / COPD exacerbation.        --NEURO--    # Toxic Metabolis Encephelopathy    Patient with worsening mental status 2/26 unclear if far from baseline. 3/1  found to have worsening CO2 to 70-80s; on BIPAP without improvement in CO2 but  will increase driving pressures. Ddx hypercapnia 2/2 COPD exacerbation vs  HFrEF exacerbation versus uremia versus missed po psychiatric medications. Has  also been noted to have elevated ammonia levels in past. Currently able to  protect airway    - Will continue to monitor mental status to determine if needs intubation for  airway protection    - s/p precedex        #Schizoaffective disorder    Reportedly baseline memory issues / confusion. On 2/26 concern for aspiration  made NPO. Is more somnolent on 3/2 there fore will hold  schizoaffective  medications    On 3/2 hold the following:    --HOLD home Carbamazepine 400 mg qAM / 600mg  qHS    --HOLD home Mirtazapine 15 mg qHS    --HOLD home Zyprexa 10 mg qHS    --HOLD home Seroquel 300 mg qHS        --PULM/ID--    #Acute hypoxic and hypercapneic respiratory failure    #  Pneumonia    #Acute exacerbation of COPD (not on home O2)    H/o COPD (last PFTS (07/21/19) FEV1 24% with FEV1/FVC 40% GOLD IV), prior  admission 01/2020 for pseudomonas pneumonia. Presented with 3 days of SOB,  fevers, and difficulty walking due / DOE. Found febrile, with leukocytosis,  and CT revealing consolidative opacity with surrounding patchy groundglass  opacities involving the entire left lower lobe concerning for pneumonia.  Likely overall acute hypoxic respiratory failure multifactorial in nature,  primary pneumonia (WBC, fever, imaging) with component of CHF exacerbation  (edematous, wt up) / COPD component (wheezing on exam, mild respiratory  acidosis). Admitted to MICU d/t high flow requirement. Started on abx per  below. Diuresed w/ initial poor response. Urine legionella / strep negative.  In the MICU became more somnolent 3/1-3/2 with worsening hypcapenia; trialing  higher driving pressures on BIPAP    Dx:    - Blood cultures 2/24 NGTD    - Unable to produce sputum for sputum cx    - VBG this 3/2 7.25/93/116/39    Tx:    - CONT BiPAP, currrent settings 22/7; plan for serial ABGs    ---- Plan for Arterial Line placement on 3/2    ---- Q4 ABGS    ---- NPO; currently able to protect airway    - START Solumederol 60 mg Q6 IV (3/2-)    - Cefepime (2/24-2/26) transitioned to Meropenem (2/27-) given altered mental  status    --- Plan for 7 day course to complete 3/3    --- S/p ceftazidime (2/27)    - Vanc d/c 2/27 given MRSA swab negative    - Continue standing Duonebs, Budesonide, mucolytics    - Chest PT q4hr and Saline nebs q4hr    - S/p Azithromycin 500mg  daily (2/24-2/27), Prednisone 40mg /60mg  (2/24-2/26)  for  COPD exacerbation        --CV--    #Acute on chronic systolic heart failure (LVEF 20-25% 01/2020)    #BiV dysfunction (Severely Reduced RV function)    #Type II NSTEMI 2/2 Demand Ischemia    #HLD    Known h/o systolic HF - last TTE (01/2020) w/ LVEF 20-25%, global LV  hypokinesis, RV dilation with severely reduced systolic function, 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 Initial picture of likely volume overload leading to  acute hypoxic respiratory failure. EKG without ischemic findings, trop 0.12.  BNP 737 (down from 1085 01/2020 admit), weight up 84kg (dry wt 75kg). Previous  admits for CHF, unclear follow up (planned to f/u w/ Renne Crigler). At that  time discharged on Toprol, last filled Lopressor 25mg  BID 04/2020, unclear  current BB regimen ongoing vs d/c'd. Previously on Aldactone, d/c'd d/t  hyperkalemia. Planned to consider ACE-I / SGLT-2 outpatient. Diuresed in ED w/  IV Lasix 120mg  / 200mg  with poor reported output, and subsequent hypotension.  Received Albumin x1.    - TTE (2/25) w BiV dysfunction (EF 20-25%); RV moderately/severely dilated  and severely reduced RV; RA dilation, elevated RA pressure, RV overload mild  MR, severe TR, trace AR, moderate PR    - Trop 0.12 -->0.08    Tx:    - HOLD Diuretics as -8L overnight 2/28 and -1.8 overnight 3/1    - s/p lasix gtt at 40    -Holding home Hydralazine 50mg  TID given hypotension    -Would benefit from RHC +/- this admit to further evaluate ? PAH    -  Consider inpatient HF consult for GDMT initation and optimization as patient did not follow up outpatient    -Continue home ASA and Atorvastatin         --RENAL--    #AKI    #Metabolic Alkaolsis    #Hyperkalemia - Resolved    Patient with worsening kidney function from baseline 1.4 with peak of 4.14  probable prerenal from cardiorenal. Urine output improved after starting lasix  gtt and now off diuretics with large UOP likley after post  ATN diuresis.  Metabolic alkaolisis likey in the setting of contraction alkaolsis (negative  9L) and some compensation for respiratory acidosis    - Renal C/s    - s/p Diamox 500 mg IV X1    - HOLD further    - s/p lasix gtt at 40 with diuril bid        --GI--    #Nutrition    - Currently NPO; plan for DHT placemet on 3/2    --- Previously uanble to sustain enteral access with acute delirium, as above    --- SLP eval; current rec'd NPO    - Nutrition c/s for TF once enteral access        #Constipation    - Give dulcolax suppository today if unable to get enteral access for oral  meds        --ENDO--    #T2DM - Last A1C 7.5 01/2020    -Holding home metformin    -Regular insulin Q6        --HEME--    H/H stable, no active issues.        RESOLVED    # Anion Gap Metabolic Acidosis    AG 17 today without concomitant acidosis (CO2 26). No recent hypotension  suggestive of lactic acidosis and infection well controlled without recent  fevers or worsening secretions. Lactate wnl and BHB elevated to 1.40  consistent with starvation ketosis in the setting of poor enteral access and  no PO intake due to AMS.        Full code (confirmed)    NPO (Plan for DHT placement on 3/2)    SQ Heparin, PPI    Daughter: Hulda Humphrey: [phone]    Social: Pt lives in a group home.    **Assessment and Plan**    Medication              -   **BISACODYL (DULCOLAX)** 10 MG Rectal QDAYPRN Constipation for 60 Days, Clinician Dir:NOT RELIEVED BY PO           -   **DEXMEDETOMIDINE 400 MCG/100 ML (PRECEDEX PREMIX)** CONTINUOUS Intravenous INFUSION for 60 Days, Clinician UJW:[id number] TO RIKER SAS 3-4 START AT 0.2 MCG/KG/HR. MAY INCREASE BY 0.1 MCG/KG/HR Q15MIN TO A MAX OF 1.5 MCG/KG/HR.           -   **MEROPENEM (MERREM)** 500 MG Intravenous Q12H First Dose Now for 60 Days, Clinician GNF:AOZHYQ TO 100 ML NS MED PLUS. INFUSE FIRST DOSE OVER 30 MINUTES AND ALL SUBSEQUENT DOSES OVER 3 HOURS.           -   **SODIUM CL 0.9% FOR INH (STOCK) (SODIUM  CHLORIDE NEB(STOCK))** 3 ML Inhalation Q4H for 60 Days           -   **SENNA (SENOKOT)** 17.2 MG Oral QDAY for 60 Days           -   **POLYETHYLENE GLYCOL (MIRALAX)** 17 G Oral QDAY First Dose Now for 60 Days           -   **  OLANZAPINE (ZYPREXA)** 10 MG Oral QHS STAT and then Routine for 60 Days, Clinician Dir:MAX DAILY DOSE: 20 MG/DAY           -   **ACETAMINOPHEN (TYLENOL)** 650 MG Oral Q6HPRN Pain, Fever, Clinician Dir:NOT TO EXCEED 4 G APAP PER DAY           -   **PANTOPRAZOLE (PROTONIX)** 40 MG Intravenous QDAY First Dose Now for 60 Days           -   **ATORVASTATIN (LIPITOR)** 80 MG Oral QHS for 60 Days           -   **TAMSULOSIN (FLOMAX)** 0.4 MG Oral QHS for 60 Days           -   **BRIMONIDINE 0.2% OPHTH (ALPHAGAN 0.2% OPHTH)** 1 DROP Both Eyes SOL TID for 60 Days           -   **DOCUSATE SODIUM (COLACE)** 100 MG Oral BID for 60 Days           -   **DORZOLAMIDE 2% OPHTH (TRUSOPT 2% OPHTH)** 1 DROP Both Eyes SOL BID for 60 Days           -   **CARBAMAZEPINE (TEGRETOL)** 400 MG Oral QAM for 60 Days           -   **ASPIRIN CHEWABLE** 81 MG Oral QDAY for 60 Days           -   ** ** ** **INSULIN LISPRO (HUMALOG) (HUMALOG)******** Sliding Scale Subcutaneous TIDWM for 60 Days      For Glucose 80 to 150 Give 0 Units    For Glucose 151 to 200 Give 2 Units    For Glucose 201 to 250 Give 3 Units    For Glucose 251 to 300 Give 4 Units    Notify Physician if: Call HO for glucose > 300          -   **IPRATROPIUM/ALBUTEROL SULFATE (DUONEB)** 3 ML Inhalation Q6H for 60 Days           -   **GUAIFENESIN SYRUP** 200 MG Oral Q4H First Dose Now for 60 Days           -   **MIRTAZAPINE (REMERON)** 15 MG Oral QHS First Dose Now for 60 Days           -   **QUETIAPINE FUMARATE (SEROQUEL)** 400 MG Oral QHS First Dose Now for 60 Days           -   **CARBAMAZEPINE (TEGRETOL)** 600 MG Oral QPM First Dose Now for 60 Days           -   **LACTULOSE (CEPHULAC)** 10 G Oral QHSPRN Constipation for 60 Days            -   **HEPARIN** 5000 UNITS Subcutaneous Q8H First Dose Now for 60 Days           -   **BISACODYL E.C. (BISACODYL)** 10 MG Oral QDAYPRN Constipation for 60 Days           -   **BUDESONIDE INHALATION (PULMICORT RESPULES)** 1 MG Inhalation BID First Dose Now for 60 Days           Comment 55 yo male with history of HFrEF (BiV dysfunction, EF 20-25% 05/2019),  COPD not on home O2, pulmonary nodules, HTN, DM, and Schizoaffective disorder  who presented for shortness of breath, found to be febrile and to have hypoxic  and hypercapnic respiratory failure concerning for primary pneumonia,  component of decompensated heart failure / COPD exacerbation.        --NEURO--    # Toxic Metabolis Encephelopathy    Patient with worsening mental status 2/26 unclear if far from baseline. 3/1  found to have worsening CO2 to 70-80s; on BIPAP without improvement in CO2 but  will increase driving pressures. Ddx hypercapnia 2/2 COPD exacerbation vs  HFrEF exacerbation versus uremia versus missed po psychiatric medications. Has  also been noted to have elevated ammonia levels in past. Currently able to  protect airway    - Will continue to monitor mental status to determine if needs intubation for  airway protection    - s/p precedex        #Schizoaffective disorder    Reportedly baseline memory issues / confusion. On 2/26 concern for aspiration  made NPO. Is more somnolent on 3/2 there fore will hold schizoaffective  medications    On 3/2 hold the following:    --HOLD home Carbamazepine 400 mg qAM / 600mg  qHS    --HOLD home Mirtazapine 15 mg qHS    --HOLD home Zyprexa 10 mg qHS    --HOLD home Seroquel 300 mg qHS        --PULM/ID--    #Acute hypoxic and hypercapneic respiratory failure    #Pneumonia    #Acute exacerbation of COPD (not on home O2)    H/o COPD (last PFTS (07/21/19) FEV1 24% with FEV1/FVC 40% GOLD IV), prior  admission 01/2020 for pseudomonas pneumonia. Presented with 3 days of SOB,  fevers, and difficulty walking  due / DOE. Found febrile, with leukocytosis,  and CT revealing consolidative opacity with surrounding patchy groundglass  opacities involving the entire left lower lobe concerning for pneumonia.  Likely overall acute hypoxic respiratory failure multifactorial in nature,  primary pneumonia (WBC, fever, imaging) with component of CHF exacerbation  (edematous, wt up) / COPD component (wheezing on exam, mild respiratory  acidosis). Admitted to MICU d/t high flow requirement. Started on abx per  below. Diuresed w/ initial poor response. Urine legionella / strep negative.  In the MICU became more somnolent 3/1-3/2 with worsening hypcapenia; trialing  higher driving pressures on BIPAP    Dx:    - Blood cultures 2/24 NGTD    - Unable to produce sputum for sputum cx    - VBG this 3/2 7.25/93/116/39    Tx:    - CONT BiPAP, currrent settings 22/7; plan for serial ABGs    ---- Plan for Arterial Line placement on 3/2    ---- Q4 ABGS    ---- NPO; currently able to protect airway    - START Solumederol 60 mg Q6 IV (3/2-)    - Cefepime (2/24-2/26) transitioned to Meropenem (2/27-) given altered mental  status    --- Plan for 7 day course to complete 3/3    --- S/p ceftazidime (2/27)    - Vanc d/c 2/27 given MRSA swab negative    - Continue standing Duonebs, Budesonide, mucolytics    - Chest PT q4hr and Saline nebs q4hr    - S/p Azithromycin 500mg  daily (2/24-2/27), Prednisone 40mg /60mg  (2/24-2/26)  for COPD exacerbation        --CV--    #Acute on chronic systolic heart failure (LVEF 20-25% 01/2020)    #BiV dysfunction (Severely Reduced RV function)    #Type II NSTEMI 2/2 Demand Ischemia    #HLD    Known h/o systolic HF - last TTE (  01/2020) w/ LVEF 20-25%, global LV  hypokinesis, RV dilation with severely reduced systolic function, 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 Initial picture of likely volume overload leading to  acute hypoxic respiratory failure. EKG  without ischemic findings, trop 0.12.  BNP 737 (down from 1085 01/2020 admit), weight up 84kg (dry wt 75kg). Previous  admits for CHF, unclear follow up (planned to f/u w/ Renne Crigler). At that  time discharged on Toprol, last filled Lopressor 25mg  BID 04/2020, unclear  current BB regimen ongoing vs d/c'd. Previously on Aldactone, d/c'd d/t  hyperkalemia. Planned to consider ACE-I / SGLT-2 outpatient. Diuresed in ED w/  IV Lasix 120mg  / 200mg  with poor reported output, and subsequent hypotension.  Received Albumin x1.    - TTE (2/25) w BiV dysfunction (EF 20-25%); RV moderately/severely dilated  and severely reduced RV; RA dilation, elevated RA pressure, RV overload mild  MR, severe TR, trace AR, moderate PR    - Trop 0.12 -->0.08    Tx:    - HOLD Diuretics as -8L overnight 2/28 and -1.8 overnight 3/1    - s/p lasix gtt at 40    -Holding home Hydralazine 50mg  TID given hypotension    -Would benefit from RHC +/- this admit to further evaluate ? PAH    -Consider inpatient HF consult for GDMT initation and optimization as patient did not follow up outpatient    -Continue home ASA and Atorvastatin         --RENAL--    #AKI    #Metabolic Alkaolsis    #Hyperkalemia - Resolved    Patient with worsening kidney function from baseline 1.4 with peak of 4.14  probable prerenal from cardiorenal. Urine output improved after starting lasix  gtt and now off diuretics with large UOP likley after post ATN diuresis.  Metabolic alkaolisis likey in the setting of contraction alkaolsis (negative  9L) and some compensation for respiratory acidosis    - Renal C/s    - s/p Diamox 500 mg IV X1    - HOLD further    - s/p lasix gtt at 40 with diuril bid        --GI--    #Nutrition    - Currently NPO; plan for DHT placemet on 3/2    --- Previously uanble to sustain enteral access with acute delirium, as above    --- SLP eval; current rec'd NPO    - Nutrition c/s for TF once enteral access        #Constipation    - Give dulcolax suppository  today if unable to get enteral access for oral  meds        --ENDO--    #T2DM - Last A1C 7.5 01/2020    -Holding home metformin    -Regular insulin Q6        --HEME--    H/H stable, no active issues.        RESOLVED    # Anion Gap Metabolic Acidosis    AG 17 today without concomitant acidosis (CO2 26). No recent hypotension  suggestive of lactic acidosis and infection well controlled without recent  fevers or worsening secretions. Lactate wnl and BHB elevated to 1.40  consistent with starvation ketosis in the setting of poor enteral access and  no PO intake due to AMS.        Full code (confirmed)    NPO (Plan for DHT placement on 3/2)  SQ Heparin, PPI    Daughter: Hulda Humphrey: [phone]    Social: Pt lives in a group home.            **Electronically signed by Renea Ee, MD on 07/10/2020 09:28**        **Electronically signed by Renea Ee, MD on 07/10/2020 09:28**

## 2020-07-04 NOTE — Progress Notes (Signed)
 **Subjective**    Subjective Overnight:    - NGT placed by ENT    - Initially only intermittently able to follow commands starting the night    --- Cefepime was transitioned to ceftazidime    --- Urine output poor on lasix drip of 20 and was given another bolus of  lasix 160mg  IV at 1 AM with lasix drip increased to 30        Interval:    - Able to recoognize why he was here and more conversive this morning    - Denies chest pain and abdominal pain; reports breathing is improved.    **Subjective**    Subjective Overnight:    - NGT placed by ENT    - Initially only intermittently able to follow commands starting the night    --- Cefepime was transitioned to ceftazidime    --- Urine output poor on lasix drip of 20 and was given another bolus of  lasix 160mg  IV at 1 AM with lasix drip increased to 30        Interval:    - Able to recoognize why he was here and more conversive this morning    - Denies chest pain and abdominal pain; reports breathing is improved.    **ROS**    Complete Review of Systems All other systems reviewed and negative except as  noted in the HPI.    **ROS**    Complete Review of Systems All other systems reviewed and negative except as  noted in the HPI.    **Vital Signs**    Vital Signs    07/07/2020 22:00              -  O2 Amount: 50%          -  Morse Fall Risk Total: 20      07/07/2020 18:47              -  Heart Rate: 95H (60-90)          -  BP: 117/66 (90-140/60-90)      CFS Vital Signs CS    07/08/2020 06:59              -  Shift Intake Total:          -  Shift Output Total:          -  Shift Balance: -      07/08/2020 06:00              -  Pain: 0          -  Riker: 4 - Calm, awakens easily,follows commands          -  Temp Calc: 37.80          -  Heart Rate: 86          -  Cardiac Rhythm: NSR - Normal Sinus Rhythm          -  BP Systolic: 105          -  BP Diastolic: 68          -  MAP: 79          -  BP Site: Left Arm          -   BP Position: Lying          -  BP Source: NIBP          -  Respirations: 18          -  SPO2 %: 94 (89-100)          -  Mode: Hi Flow          -  FiO2: 40%, 50L          -  IV Infusion 1: KVO - NS          -  IV Dose 1: 5 ml/hr          -  ml/hour 1: 5 ml/hr          -  IV Infusion 2: furosemide          -  IV Dose 2: 40mg /hr          -  ml/hour 2: 20 ml/hr          -  IV Infusion 3: Dexmedetomidine 469mcg/100ml          -  IV Dose 3: 0.6 mcg/kg/hr          -  ml/hour 3: 12.6 ml/hr      07/08/2020 02:42              -  Call Bell Within Reach: Yes          -  High Fall Risk Interventions: Yes          -  HOB Elevation: 30      07/08/2020 02:00              -  EtCO2: 94      07/08/2020 00:00              -  Repositioned Side: Back          -  Oral Hygiene Q4: Yes          -  Foley care per protocol: Yes      07/07/2020 20:00              -  Temp Site: Axillary        **Vital Signs**    Vital Signs    07/07/2020 22:00              -  O2 Amount: 50%          -  Morse Fall Risk Total: 20      07/07/2020 18:47              -  Heart Rate: 95H (60-90)          -  BP: 117/66 (90-140/60-90)      CFS Vital Signs CS    07/08/2020 06:59              -  Shift Intake Total:          -  Shift Output Total:          -  Shift Balance: -      07/08/2020 06:00              -  Pain: 0          -  Riker: 4 - Calm, awakens easily,follows commands          -  Temp Calc: 37.80          -  Heart Rate: 86          -  Cardiac Rhythm: NSR - Normal Sinus Rhythm          -  BP Systolic: 105          -  BP Diastolic: 68          -  MAP: 79          -  BP Site: Left Arm          -  BP Position: Lying          -  BP Source: NIBP          -  Respirations: 18          -  SPO2 %: 94 (89-100)          -  Mode: Hi Flow          -  FiO2: 40%, 50L          -  IV Infusion 1: KVO - NS          -  IV Dose 1: 5 ml/hr          -  ml/hour 1: 5 ml/hr           -  IV Infusion 2: furosemide          -  IV Dose 2: 40mg /hr          -  ml/hour 2: 20 ml/hr          -  IV Infusion 3: Dexmedetomidine 479mcg/100ml          -  IV Dose 3: 0.6 mcg/kg/hr          -  ml/hour 3: 12.6 ml/hr      07/08/2020 02:42              -  Call Bell Within Reach: Yes          -  High Fall Risk Interventions: Yes          -  HOB Elevation: 30      07/08/2020 02:00              -  EtCO2: 94      07/08/2020 00:00              -  Repositioned Side: Back          -  Oral Hygiene Q4: Yes          -  Foley care per protocol: Yes      07/07/2020 20:00              -  Temp Site: Axillary        **General**    Comment T afebrile (Tmax 37.8) HR 80-105 (NSR) R 17-30 BP 90-115/55-70 (MAP  70-85) Sat 92-96 on HFNC 50L, FiO2 40%        I/O: +1.3L / -4.8 L / -3.5L        General: Awake and alert, able to answer some questions    HEENT: JVD to earlobe at 30 degrees    CVR: RRR with no murmurs, rubs and gallops    Pulm: Rhonchi anteriorally b/l, no wheezes    GI: soft, NT/NT, distended and tympanitic    Extremities: 2+ b/l lower extremity edema, wwp    Skin: no rashes.    **General**    Comment T afebrile (Tmax 37.8) HR 80-105 (NSR) R 17-30 BP 90-115/55-70 (MAP  70-85) Sat 92-96 on HFNC 50L, FiO2 40%        I/O: +1.3L / -4.8 L / -3.5L        General: Awake and alert, able to answer some questions    HEENT: JVD  to earlobe at 30 degrees    CVR: RRR with no murmurs, rubs and gallops    Pulm: Rhonchi anteriorally b/l, no wheezes    GI: soft, NT/NT, distended and tympanitic    Extremities: 2+ b/l lower extremity edema, wwp    Skin: no rashes.    **Assessment and Plan**    Medication              -   **BISACODYL (DULCOLAX)** 10 MG Rectal QDAYPRN Constipation for 60 Days, Clinician Dir:NOT RELIEVED BY PO           -   **DEXMEDETOMIDINE 400 MCG/100 ML (PRECEDEX PREMIX)** CONTINUOUS Intravenous INFUSION for 60 Days, Clinician NFA:OZHYQMV TO RIKER SAS 3-4 START AT 0.2 MCG/KG/HR. MAY  INCREASE BY 0.1 MCG/KG/HR Q15MIN TO A MAX OF 1.5 MCG/KG/HR.           -   **FUROSEMIDE (LASIX)** 200 MG Intravenous INFUSION STAT and then Routine, Clinician HQI:ONGE = 40 MG/HR. CONC = 2 MG/ML. MESSAGE PHARMACY 2 HOURS BEFORE NEXT BAG IS DUE. PROTECT FROM LIGHT.           -   **MEROPENEM (MERREM)** 500 MG Intravenous Q12H First Dose Now for 60 Days, Clinician XBM:WUXLKG TO 100 ML NS MED PLUS. INFUSE FIRST DOSE OVER 30 MINUTES AND ALL SUBSEQUENT DOSES OVER 3 HOURS.           -   **SODIUM CL 0.9% FOR INH (STOCK) (SODIUM CHLORIDE NEB(STOCK))** 3 ML Inhalation Q4H for 60 Days           -   **SENNA (SENOKOT)** 17.2 MG Oral QDAY for 60 Days           -   **POLYETHYLENE GLYCOL (MIRALAX)** 17 G Oral QDAY First Dose Now for 60 Days           -   **OLANZAPINE (ZYPREXA)** 10 MG Oral QHS STAT and then Routine for 60 Days, Clinician Dir:MAX DAILY DOSE: 20 MG/DAY           -   **ACETAMINOPHEN (TYLENOL)** 650 MG Oral Q6HPRN Pain, Fever, Clinician Dir:NOT TO EXCEED 4 G APAP PER DAY           -   **PANTOPRAZOLE (PROTONIX)** 40 MG Intravenous QDAY First Dose Now for 60 Days           -   **ATORVASTATIN (LIPITOR)** 80 MG Oral QHS for 60 Days           -   **TAMSULOSIN (FLOMAX)** 0.4 MG Oral QHS for 60 Days           -   **CARBAMAZEPINE (TEGRETOL)** 400 MG Oral QAM for 60 Days           -   **ASPIRIN CHEWABLE** 81 MG Oral QDAY for 60 Days           -   **DOCUSATE SODIUM (COLACE)** 100 MG Oral BID for 60 Days           -   **BRIMONIDINE 0.2% OPHTH (ALPHAGAN 0.2% OPHTH)** 1 DROP Both Eyes SOL TID for 60 Days           -   **DORZOLAMIDE 2% OPHTH (TRUSOPT 2% OPHTH)** 1 DROP Both Eyes SOL BID for 60 Days           -   ** ** ** **INSULIN LISPRO (HUMALOG) (HUMALOG)******** Sliding Scale Subcutaneous TIDWM for 60 Days      For Glucose 80 to 150 Give 0 Units  For Glucose 151 to 200 Give 2 Units    For Glucose 201 to 250 Give 3 Units    For Glucose 251 to 300 Give 4 Units    Notify Physician if: Call HO for  glucose > 300          -   **IPRATROPIUM/ALBUTEROL SULFATE (DUONEB)** 3 ML Inhalation Q6H for 60 Days           -   **GUAIFENESIN SYRUP** 200 MG Oral Q4H First Dose Now for 60 Days           -   **MIRTAZAPINE (REMERON)** 15 MG Oral QHS First Dose Now for 60 Days           -   **QUETIAPINE FUMARATE (SEROQUEL)** 400 MG Oral QHS First Dose Now for 60 Days           -   **CARBAMAZEPINE (TEGRETOL)** 600 MG Oral QPM First Dose Now for 60 Days           -   **LACTULOSE (CEPHULAC)** 10 G Oral QHSPRN Constipation for 60 Days           -   **HEPARIN** 5000 UNITS Subcutaneous Q8H First Dose Now for 60 Days           -   **BISACODYL E.C. (BISACODYL)** 10 MG Oral QDAYPRN Constipation for 60 Days           -   **BUDESONIDE INHALATION (PULMICORT RESPULES)** 1 MG Inhalation BID First Dose Now for 60 Days           Comment 55 yo male with history of HFrEF (BiV dysfunction, EF 20-25% 05/2019),  COPD not on home O2, pulmonary nodules, HTN, DM, and Schizoaffective disorder  who presented for shortness of breath, found to be febrile and to have hypoxic  respiratory failure concerning for primary pneumonia, component of  decompensated heart failure / COPD exacerbation.        --NEURO--    # Toxic Metabolis Encephelopathy    Patient with worsening mental status 2/26 with slight improvement today. ABG  with pCO2 60, largely unchanged. Ddx HFrEF exacerbation versus sepsis versus  uremia versus missed po psychiatric medications. Has also been noted to have  elevated ammonia levels in past. Currently able to protect airway    - Continue precedex (started 2/27 for aggitation)    --- Wean, as able, as mental status improves    - Will continue to monitor mental status to determine if needs intubation for  airway protection        #Schizoaffective disorder    Alert, partially oriented, reports baseline memory issues / confusion. On 2/26  concern for aspiration made NPO. Has been unable to receive psych meds since  2/27 given  patient removed dobhoff and meds unable to be converted to IV    -Cont home Carbamazepine 400 mg qAM / 600mg  qHS    -Cont home Mirtazapine 15 mg qHS    -Cont home Zyprexa 10 mg qHS    -Cont home Seroquel 300 mg qHS    --- Will have SLP re-evaluate with plan to replace dobhoff for med  administration if does not pass SLP        --PULM/ID--    #Acute hypoxic respiratory failure    #Pneumonia    #Acute exacerbation of COPD (not on home O2)    H/o COPD (last PFTS (07/21/19) FEV1 24% with FEV1/FVC 40% GOLD IV), prior  admission 01/2020 for pseudomonas  pneumonia. Presented with 3 days of SOB,  fevers, and difficulty walking due / DOE. Found febrile, with leukocytosis,  and CT revealing consolidative opacity with surrounding patchy groundglass  opacities involving the entire left lower lobe concerning for pneumonia.  Likely overall acute hypoxic respiratory failure multifactorial in nature,  primary pneumonia (WBC, fever, imaging) with component of CHF exacerbation  (edematous, wt up) / COPD component (wheezing on exam, mild respiratory  acidosis). Admitted to MICU d/t high flow requirement. Started on abx per  below. Diuresed w/ initial poor response. Urine legionella / strep negative.  Given lack of wheezing on exam today, concern higher for CHF and pneumonia as  contributing to acute hypoxic resp failure        Dx:    - Blood cultures 2/24 NGTD    - Unable to produce sputum for sputum cx    Tx:    - Cefepime (2/24-2/26) transitioned to Meropenem (2/27-) given altered mental  status    --- Plan for 7 day course to complete 3/3    --- S/p ceftazidime (2/27)    - Vanc d/c 2/27 given MRSA swab negative    - Continue standing Duonebs, Budesonide, mucolytics    - Chest PT q4hr and Saline nebs q4hr    - S/p Azithromycin 500mg  daily (2/24-2/27), Prednisone 40mg /60mg  (2/24-2/26)  for COPD exacerbation    - Continue NPO given tenuous respiratory status    - Continue HFNC, wean FiO2 as tolerated for goal SpO2>92% (currently at  40%  50L in AM)        --CV--    #Acute on chronic systolic heart failure (LVEF 20-25% 01/2020)    #BiV dysfunction (Severely Reduced RV function)    #Type II NSTEMI 2/2 Demand Ischemia    #HLD    Known h/o systolic HF - last TTE (01/2020) w/ LVEF 20-25%, global LV  hypokinesis, RV dilation with severely reduced systolic function, 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 Initial picture of likely volume overload leading to  acute hypoxic respiratory failure. EKG without ischemic findings, trop 0.12.  BNP 737 (down from 1085 01/2020 admit), weight up 84kg (dry wt 75kg). Previous  admits for CHF, unclear follow up (planned to f/u w/ Renne Crigler). At that  time discharged on Toprol, last filled Lopressor 25mg  BID 04/2020, unclear  current BB regimen ongoing vs d/c'd. Previously on Aldactone, d/c'd d/t  hyperkalemia. Planned to consider ACE-I / SGLT-2 outpatient. Diuresed in ED w/  IV Lasix 120mg  / 200mg  with poor reported output, and subsequent hypotension.  Received Albumin x1.    - TTE (2/25) w BiV dysfunction (EF 20-25%); RV moderately/severely dilated  and severely reduced RV; RA dilation, elevated RA pressure, RV overload mild  MR, severe TR, trace AR, moderate PR    - Trop 0.12 -->0.08    Tx:    -Continue lasix gtt at 40    --- Will continue to monitor urine output and decrease if output >200cc/hr  given severely reduced RV function    -Holding home Hydralazine 50mg  TID given hypotension    -Would benefit from RHC +/- this admit to further evaluate ? PAH    -Consider inpatient HF consult for GDMT initation and optimization as patient did not follow up outpatient    -Continue home ASA and Atorvastatin         --RENAL--    #AKI    #Hyperkalemia - Resolved  Patient with worsening kidney function from baseline 1.4 with peak of 4.14  probable prerenal from cardiorenal. Urine output improved after starting lasix  gtt    - Renal C/s    - Continue  lasix gtt at 40 with diuril bid today, as above        # Anion Gap Metabolic Acidosis    AG 17 today without concomitant acidosis (CO2 26). No recent hypotension  suggestive of lactic acidosis and infection well controlled without recent  fevers or worsening secretions. Had not been able to maintain enteral access  for feeds and mental status precluding oral intake, so probable starvation  ketosis    - Check lactate    - Check beta-hydroxybutarate        --GI--    #Nutrition    - Currently NPO    --- Unable to sustain enteral access with acute delirium, as above    --- SLP re-evaluation today    - Nutriton c/s 2/27 after enteral access for TF        #Constipation    - Give dulcolax suppository today if unable to get enteral access for oral  meds        --ENDO--    #T2DM - Last A1C 7.5 01/2020    -Holding home metformin    -ISS        --HEME--    H/H stable, no active issues.        Full code (confirmed)    NPO (Will have SLP eval with plan to start oral feeds to tube feeds today)    SQ Heparin, PPI    Daughter: Arthur Young: [phone]    Social: Pt lives in a group home.    **Assessment and Plan**    Medication              -   **BISACODYL (DULCOLAX)** 10 MG Rectal QDAYPRN Constipation for 60 Days, Clinician Dir:NOT RELIEVED BY PO           -   **DEXMEDETOMIDINE 400 MCG/100 ML (PRECEDEX PREMIX)** CONTINUOUS Intravenous INFUSION for 60 Days, Clinician NWG:NFAOZHY TO RIKER SAS 3-4 START AT 0.2 MCG/KG/HR. MAY INCREASE BY 0.1 MCG/KG/HR Q15MIN TO A MAX OF 1.5 MCG/KG/HR.           -   **FUROSEMIDE (LASIX)** 200 MG Intravenous INFUSION STAT and then Routine, Clinician QMV:HQIO = 40 MG/HR. CONC = 2 MG/ML. MESSAGE PHARMACY 2 HOURS BEFORE NEXT BAG IS DUE. PROTECT FROM LIGHT.           -   **MEROPENEM (MERREM)** 500 MG Intravenous Q12H First Dose Now for 60 Days, Clinician NGE:XBMWUX TO 100 ML NS MED PLUS. INFUSE FIRST DOSE OVER 30 MINUTES AND ALL SUBSEQUENT DOSES OVER 3 HOURS.           -   **SODIUM CL 0.9% FOR INH  (STOCK) (SODIUM CHLORIDE NEB(STOCK))** 3 ML Inhalation Q4H for 60 Days           -   **SENNA (SENOKOT)** 17.2 MG Oral QDAY for 60 Days           -   **POLYETHYLENE GLYCOL (MIRALAX)** 17 G Oral QDAY First Dose Now for 60 Days           -   **OLANZAPINE (ZYPREXA)** 10 MG Oral QHS STAT and then Routine for 60 Days, Clinician Dir:MAX DAILY DOSE: 20 MG/DAY           -   **ACETAMINOPHEN (TYLENOL)** 650 MG Oral  Q6HPRN Pain, Fever, Clinician Dir:NOT TO EXCEED 4 G APAP PER DAY           -   **PANTOPRAZOLE (PROTONIX)** 40 MG Intravenous QDAY First Dose Now for 60 Days           -   **ATORVASTATIN (LIPITOR)** 80 MG Oral QHS for 60 Days           -   **TAMSULOSIN (FLOMAX)** 0.4 MG Oral QHS for 60 Days           -   **CARBAMAZEPINE (TEGRETOL)** 400 MG Oral QAM for 60 Days           -   **ASPIRIN CHEWABLE** 81 MG Oral QDAY for 60 Days           -   **DOCUSATE SODIUM (COLACE)** 100 MG Oral BID for 60 Days           -   **BRIMONIDINE 0.2% OPHTH (ALPHAGAN 0.2% OPHTH)** 1 DROP Both Eyes SOL TID for 60 Days           -   **DORZOLAMIDE 2% OPHTH (TRUSOPT 2% OPHTH)** 1 DROP Both Eyes SOL BID for 60 Days           -   ** ** ** **INSULIN LISPRO (HUMALOG) (HUMALOG)******** Sliding Scale Subcutaneous TIDWM for 60 Days      For Glucose 80 to 150 Give 0 Units    For Glucose 151 to 200 Give 2 Units    For Glucose 201 to 250 Give 3 Units    For Glucose 251 to 300 Give 4 Units    Notify Physician if: Call HO for glucose > 300          -   **IPRATROPIUM/ALBUTEROL SULFATE (DUONEB)** 3 ML Inhalation Q6H for 60 Days           -   **GUAIFENESIN SYRUP** 200 MG Oral Q4H First Dose Now for 60 Days           -   **MIRTAZAPINE (REMERON)** 15 MG Oral QHS First Dose Now for 60 Days           -   **QUETIAPINE FUMARATE (SEROQUEL)** 400 MG Oral QHS First Dose Now for 60 Days           -   **CARBAMAZEPINE (TEGRETOL)** 600 MG Oral QPM First Dose Now for 60 Days           -   **LACTULOSE (CEPHULAC)** 10 G Oral QHSPRN  Constipation for 60 Days           -   **HEPARIN** 5000 UNITS Subcutaneous Q8H First Dose Now for 60 Days           -   **BISACODYL E.C. (BISACODYL)** 10 MG Oral QDAYPRN Constipation for 60 Days           -   **BUDESONIDE INHALATION (PULMICORT RESPULES)** 1 MG Inhalation BID First Dose Now for 60 Days           Comment 55 yo male with history of HFrEF (BiV dysfunction, EF 20-25% 05/2019),  COPD not on home O2, pulmonary nodules, HTN, DM, and Schizoaffective disorder  who presented for shortness of breath, found to be febrile and to have hypoxic  respiratory failure concerning for primary pneumonia, component of  decompensated heart failure / COPD exacerbation.        --NEURO--    # Toxic [place] Encephelopathy    Patient with worsening mental  status 2/26 with slight improvement today. ABG  with pCO2 60, largely unchanged. Ddx HFrEF exacerbation versus sepsis versus  uremia versus missed po psychiatric medications. Has also been noted to have  elevated ammonia levels in past. Currently able to protect airway    - Continue precedex (started 2/27 for aggitation)    --- Wean, as able, as mental status improves    - Will continue to monitor mental status to determine if needs intubation for  airway protection        #Schizoaffective disorder    Alert, partially oriented, reports baseline memory issues / confusion. On 2/26  concern for aspiration made NPO. Has been unable to receive psych meds since  2/27 given patient removed dobhoff and meds unable to be converted to IV    -Cont home Carbamazepine 400 mg qAM / 600mg  qHS    -Cont home Mirtazapine 15 mg qHS    -Cont home Zyprexa 10 mg qHS    -Cont home Seroquel 300 mg qHS    --- Will have SLP re-evaluate with plan to replace dobhoff for med  administration if does not pass SLP        --PULM/ID--    #Acute hypoxic respiratory failure    #Pneumonia    #Acute exacerbation of COPD (not on home O2)    H/o COPD (last PFTS (07/21/19) FEV1 24% with FEV1/FVC 40% GOLD IV),  prior  admission 01/2020 for pseudomonas pneumonia. Presented with 3 days of SOB,  fevers, and difficulty walking due / DOE. Found febrile, with leukocytosis,  and CT revealing consolidative opacity with surrounding patchy groundglass  opacities involving the entire left lower lobe concerning for pneumonia.  Likely overall acute hypoxic respiratory failure multifactorial in nature,  primary pneumonia (WBC, fever, imaging) with component of CHF exacerbation  (edematous, wt up) / COPD component (wheezing on exam, mild respiratory  acidosis). Admitted to MICU d/t high flow requirement. Started on abx per  below. Diuresed w/ initial poor response. Urine legionella / strep negative.  Given lack of wheezing on exam today, concern higher for CHF and pneumonia as  contributing to acute hypoxic resp failure        Dx:    - Blood cultures 2/24 NGTD    - Unable to produce sputum for sputum cx    Tx:    - Cefepime (2/24-2/26) transitioned to Meropenem (2/27-) given altered mental  status    --- Plan for 7 day course to complete 3/3    --- S/p ceftazidime (2/27)    - Vanc d/c 2/27 given MRSA swab negative    - Continue standing Duonebs, Budesonide, mucolytics    - Chest PT q4hr and Saline nebs q4hr    - S/p Azithromycin 500mg  daily (2/24-2/27), Prednisone 40mg /60mg  (2/24-2/26)  for COPD exacerbation    - Continue NPO given tenuous respiratory status    - Continue HFNC, wean FiO2 as tolerated for goal SpO2>92% (currently at 40%  50L in AM)        --CV--    #Acute on chronic systolic heart failure (LVEF 20-25% 01/2020)    #BiV dysfunction (Severely Reduced RV function)    #Type II NSTEMI 2/2 Demand Ischemia    #HLD    Known h/o systolic HF - last TTE (01/2020) w/ LVEF 20-25%, global LV  hypokinesis, RV dilation with severely reduced systolic function, 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 Initial picture of likely volume  overload leading to  acute hypoxic  respiratory failure. EKG without ischemic findings, trop 0.12.  BNP 737 (down from 1085 01/2020 admit), weight up 84kg (dry wt 75kg). Previous  admits for CHF, unclear follow up (planned to f/u w/ Renne Crigler). At that  time discharged on Toprol, last filled Lopressor 25mg  BID 04/2020, unclear  current BB regimen ongoing vs d/c'd. Previously on Aldactone, d/c'd d/t  hyperkalemia. Planned to consider ACE-I / SGLT-2 outpatient. Diuresed in ED w/  IV Lasix 120mg  / 200mg  with poor reported output, and subsequent hypotension.  Received Albumin x1.    - TTE (2/25) w BiV dysfunction (EF 20-25%); RV moderately/severely dilated  and severely reduced RV; RA dilation, elevated RA pressure, RV overload mild  MR, severe TR, trace AR, moderate PR    - Trop 0.12 -->0.08    Tx:    -Continue lasix gtt at 40    --- Will continue to monitor urine output and decrease if output >200cc/hr  given severely reduced RV function    -Holding home Hydralazine 50mg  TID given hypotension    -Would benefit from RHC +/- this admit to further evaluate ? PAH    -Consider inpatient HF consult for GDMT initation and optimization as patient did not follow up outpatient    -Continue home ASA and Atorvastatin         --RENAL--    #AKI    #Hyperkalemia - Resolved    Patient with worsening kidney function from baseline 1.4 with peak of 4.14  probable prerenal from cardiorenal. Urine output improved after starting lasix  gtt    - Renal C/s    - Continue lasix gtt at 40 with diuril bid today, as above        # Anion Gap Metabolic Acidosis    AG 17 today without concomitant acidosis (CO2 26). No recent hypotension  suggestive of lactic acidosis and infection well controlled without recent  fevers or worsening secretions. Had not been able to maintain enteral access  for feeds and mental status precluding oral intake, so probable starvation  ketosis    - Check lactate    - Check beta-hydroxybutarate        --GI--    #Nutrition    - Currently NPO    --- Unable  to sustain enteral access with acute delirium, as above    --- SLP re-evaluation today    - Nutriton c/s 2/27 after enteral access for TF        #Constipation    - Give dulcolax suppository today if unable to get enteral access for oral  meds        --ENDO--    #T2DM - Last A1C 7.5 01/2020    -Holding home metformin    -ISS        --HEME--    H/H stable, no active issues.        Full code (confirmed)    NPO (Will have SLP eval with plan to start oral feeds to tube feeds today)    SQ Heparin, PPI    Daughter: Arthur Young: [phone]    Social: Pt lives in a group home.            **Electronically signed by Arnetha Gula, MD on 07/08/2020 08:37**        **Electronically signed by Arnetha Gula, MD on 07/08/2020 08:37**

## 2020-07-04 NOTE — Progress Notes (Signed)
 Supervisory Note For Resident I performed a history and physical examination  of the patient and discussed the management with the resident. I reviewed the  resident's note and agree with the documented findings and plan of care. Yes,    Additional Notes    No acute events. No SOB.        Seen sitting in chair, cheerful and appropriate.        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 continues on COPD regimen with ICS/SABA/SAMA. Prednisone  taper currently at 40, plan to drop by 10 every 2-3 days    CV: clinically euvolumic - on oral diuretic regimen at present. On ACEI and BB  - will uptitrate to optimize HR (~60-80) and afterload (BP ~90-100). Stopped  hydral in favor of ACEI    Acute on chronic heart failure with reduced EF, goal even,resume home lasix  and follow I/O. titrating ACE-I up, and will start decreasing hydral. Cont  metop.    RENAL: AKI improving, NA normalized. Hyperkalemia - unclear etiology and not  ECG changes. WIll dose with kayexelate, and monitor. May need to reconsider  ACE if K is intolerant (pt with h/o high K - etiology unclear, but 'intolerant  of spironolactone' related to this side effect).    Psych: Home remeron/zyprexa/seroquel, last QTC 477.    FEN: passed SLP today - will remove DT, and adv diet    Possible DC tomorrow.        Remainder per resident        .            **Electronically signed by Wanita Chamberlain, MD on 07/15/2020 21:45**

## 2020-07-04 NOTE — Progress Notes (Signed)
 Supervisory Note For Fellow I performed a history and physical examination of  the patient and discussed the management with the fellows. I reviewed the  fellow's note and agree with the documented findings and plan of care. Yes.            **Electronically signed by Margret Chance, MD on 07/08/2020 20:37**

## 2020-07-04 NOTE — Progress Notes (Signed)
 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  Chest CT revealed: consolidative opacity with surrounding  patchy groundglass opacities involving the entire left lower lobe).  Hospitalization complicated by toxic-metabolic encephalopathy (ddx: sepsis vs.  uremia vs. missed psychiatric medications) and tenuous cardiopulmonary status.        The patient is being followed for ongoing assessment of the oropharyngeal  swallow mechanism to determine the risk of aspiration and potential to resume  a PO diet. Previous evaluations yielding an impairment oropharyngeal swallow  mechanism negative impacted by altered mental status and waxing/waning levels  of alertness warranting NPO status except for ice chips, sips of water and  essential medications with puree as tolerated.  Per discussion with RN, pt has  been tolerating PO meds, ice chips and conservative trials of water via tsp  well. Upon entry, patient lying in bed breathing comfortably on supplemental  oxygen via NC, awake and requesting water. HOB elevated to 90* for PO trials  administered by SLP. RN present throughout evaluation.            **Exam:**    _Current Status:_    Communicative: verbal, paucity of speech; decreased intelligibility,  nonsensical content    Cognitive: impaired at baseline, follows simple commands with models and  repetition    Behavioral: confused, awake but lethargic, passive participation    Hearing: responsive to voice    Nutrition: NPO except ice chips, sips of water and essential medication  with  puree    Respiratory: breathing comfortably on 6L NC        _Oral Motor Examination:_    Dentition: poor quality; reduced quantity    Head and neck: symmetrical orofacial structures; NC and NG tube in place    Lingual function: protrusion midline    Labial function: symmetrical at rest and activation    Oral mucosa: dry appearing    Palate: not evaluated    Voice: low intensity, clear sounding    Cough to command: weak        _Clinical Swallow Evaluation_ :    Swallowing was evaluated with the following consistencies:  ice chip, thin  liquids via tsp, cup and straw, puree solids        ORAL PHASE:    Labial seal: adequate for utensil stripping and straw seal given direct  placement, no anterior loss of bolus    Mastication: vertical with ice chips    Intraoral residue: none observed        PHARYNGEAL PHASE:    Pharyngeal initiation: present, subjectively delayed    Laryngeal elevation: reduced to palpation    Repeated Swallows: 1-2x/swallows per bolus    Post-prandial cough or throat clearing: immediate prolonged and congested  cough following straw and cup sip    Post-prandial vocal quality: clear, comparable to baseline        **Impression** : The patient continues to present with an impaired  oropharyngeal swallow mechanism negatively impacted by altered mental status  and fluctuating level of alertness warranting continued  NPO status with  allowance of ice chips, sips of water and medications administered in puree  with strict aspiration precautions maintained. Though he has made some gains  within altered mental status and cognition, given clinical presentation high  risk for aspiration and subsequent medical complications prevail. An oral diet  remains contraindicated at this time. It is reasonable to  continue to allow  ice chips, sips of water and essential medication with puree as tolerated  while maintaining aspiration precautions to provide oral comfort, strengthen  pharyngeal musculature and  prevent additional non-use atrophy. SLP will  continue to follow for ongoing assessment and to determine readiness to resume  a PO diet.        **Recommendations** :    1\. NPO except ice chips, sips of water and essential medication with puree as  tolerated              -  Upright 90 degrees           -  Only offer when awake/alert and engaging           -  Ensure stable respiratory status prior to PO intake           -  Withhold given overt s/s of aspiration or decline in mental/respiratory status       2\. Consider re-establishing alternate means of nutrition, hydration and  medication at the discretion of primary team    3\. Oral care with moisture 2-3x/shift    4\. Will follow 3-5x/week for ongoing assessment of oropharyngeal swallow  mechanism        Case discussed with patient and NSG. Primary team contacted via [employer]        Please page with questions or concerns.        Dulce Sellar MS/CCC-SLP    Department of Speech-Language Pathology    Pager: [phone] or [facility] Text                **Electronically signed by Clair Gulling, CCC-SLP on 07/11/2020 12:57**

## 2020-07-04 NOTE — Progress Notes (Signed)
 **Subjective**    Subjective Overnight:    No acute events.        This morning:    Feels well, no complaints. Still no bowel movement yet. Breathing is stable.    **Subjective**    Subjective Overnight:    No acute events.        This morning:    Feels well, no complaints. Still no bowel movement yet. Breathing is stable.    **Exam**    Comment Vitals per So[facility].        Gen: in NAD, comfortable in chair at bedside    HEENT: DHT in place, nasal cannula    Cardio: RRR, no MRGs    Lungs: normal WOB, bilateral crackles at the bases L>R    Abd: ND, NT    Ext: WWP, no pitting edema    Skin: darkening of bilateral shins ?venous stasis.      Vital Signs    07/14/2020 13:35              -  Morse Fall Risk Total: 50      07/14/2020 13:07              -  Temperature: 36.4 (35-37.8Cel)          -  Site: Oral          -  Heart Rate: 84 (60-90)          -  Site: Monitor          -  BP: 92/61 (90-140/60-90)          -  Site: Right Arm          -  Position: Lying          -  Method: Automated          -  Respirations: 18 (12-20)          -  O2 Saturation (%): 96          -  O2 Delivery Method: Nasal Cannula          -  MEWS Vital Sign Score: 0      07/14/2020 06:21              -  Weight: 67.9kg      07/14/2020 04:52              -  O2 Amount: 2.0 LPM      07/13/2020 15:34              -  Mean Arterial Pressure1: 91 (60)      CFS Vital Signs CS    07/14/2020 14:59              -  Shift Intake Total: 0ml          -  Shift Output Total:          -  Shift Balance: -      07/13/2020 13:00              -  Pain: 0          -  Riker: 4 - Calm, awakens easily,follows commands          -  Heart Rate: 81          -  Cardiac Rhythm: NSR - Normal Sinus Rhythm          -  BP Systolic: 115          -  BP Diastolic: 77          -  MAP: 89          -  BP Site: Right Arm          -  BP Position: Sitting          -  BP Source: NIBP          -  Respirations: 21          -   SPO2 %: 93 (89-100)          -  O2 Amount: 0.5 LPM          -  Mode: Nasal Cannula          -  Call Bell Within Reach: Yes          -  High Fall Risk Interventions: Yes          -  Repositioned Side: In Chair          -  Enteral Feeding: Nutren 1.0          -  Enteral Feeding Rate: 70cc/hr          -  IV Infusion 1: Normal Saline (KVO/Meds)          -  IV Dose 1: HOLD          -  ml/hour 1: HOLD      07/13/2020 12:00              -  Temp Calc: 36.80          -  Temp Site: Oral      07/13/2020 06:00              -  HOB Elevation: 30          I have reviewed and agree with vital signs as listed in the EMR Yes Time  07/14/2020 14:34.    **Exam**    Comment Vitals per So[facility].        Gen: in NAD, comfortable in chair at bedside    HEENT: DHT in place, nasal cannula    Cardio: RRR, no MRGs    Lungs: normal WOB, bilateral crackles at the bases L>R    Abd: ND, NT    Ext: WWP, no pitting edema    Skin: darkening of bilateral shins ?venous stasis.      Vital Signs    07/14/2020 13:35              -  Morse Fall Risk Total: 50      07/14/2020 13:07              -  Temperature: 36.4 (35-37.8Cel)          -  Site: Oral          -  Heart Rate: 84 (60-90)          -  Site: Monitor          -  BP: 92/61 (90-140/60-90)          -  Site: Right Arm          -  Position: Lying          -  Method: Automated          -  Respirations: 18 (12-20)          -  O2 Saturation (%): 96          -  O2 Delivery Method: Nasal Cannula          -  MEWS Vital Sign Score: 0      07/14/2020 06:21              -  Weight: 67.9kg      07/14/2020 04:52              -  O2 Amount: 2.0 LPM      07/13/2020 15:34              -  Mean Arterial Pressure1: 91 (60)      CFS Vital Signs CS    07/14/2020 14:59              -  Shift Intake Total: 0ml          -  Shift Output Total:          -  Shift Balance: -      07/13/2020 13:00              -  Pain: 0          -  Riker: 4 - Calm, awakens  easily,follows commands          -  Heart Rate: 81          -  Cardiac Rhythm: NSR - Normal Sinus Rhythm          -  BP Systolic: 115          -  BP Diastolic: 77          -  MAP: 89          -  BP Site: Right Arm          -  BP Position: Sitting          -  BP Source: NIBP          -  Respirations: 21          -  SPO2 %: 93 (89-100)          -  O2 Amount: 0.5 LPM          -  Mode: Nasal Cannula          -  Call Bell Within Reach: Yes          -  High Fall Risk Interventions: Yes          -  Repositioned Side: In Chair          -  Enteral Feeding: Nutren 1.0          -  Enteral Feeding Rate: 70cc/hr          -  IV Infusion 1: Normal Saline (KVO/Meds)          -  IV Dose 1: HOLD          -  ml/hour 1: HOLD      07/13/2020 12:00              -  Temp Calc: 36.80          -  Temp Site: Oral      07/13/2020 06:00              -  HOB Elevation: 30          I have reviewed and agree with vital signs as listed in the EMR Yes Time  07/14/2020 14:34.    **Assessment and Plan**    Medication              -   **  HYDRALAZINE (APRESOLINE)** 25 MG Oral TID for 57 Days           -   **BISACODYL (DULCOLAX)** 10 MG Rectal ONE TIME for 1 Doses           -   **FUROSEMIDE (LASIX)** 40 MG Oral QDAY First Dose Now for 60 Days           -   **PREDNISONE (DELTASONE)** 40 MG Oral QDAY First Dose Now for 60 Days           -   **INSULIN GLARGINE(LANTUS) (INSULIN LANTUS)** 4 UNITS Subcutaneous QHS for 60 Days           -   **POLYETHYLENE GLYCOL (MIRALAX)** 17 G Oral BID for 60 Days           -   **METOPROLOL (LOPRESSOR)** 6.25 MG Oral QID for 60 Days           -   **CAPTOPRIL (CAPOTEN)** 6.25 MG Oral TID First Dose Now for 60 Days           -   **OLANZAPINE (ZYPREXA)** 10 MG Oral QHS for 60 Days           -   **QUETIAPINE FUMARATE (SEROQUEL)** 300 MG Oral QHS for 60 Days           -   **MIRTAZAPINE (REMERON)** 15 MG Oral QHS for 60 Days           -   **CARBAMAZEPINE (TEGRETOL)**  600 MG Orogastric Tube QPM for 60 Days           -   **GUAIFENESIN SYRUP** 200 MG Oral Q4H First Dose Now           -   **CARBAMAZEPINE (TEGRETOL)** 400 MG Orogastric Tube QAM for 60 Days           -   **DOCUSATE SODIUM** 100 MG Nasogastric BID for 60 Days           -   **HYDRALAZINE (APRESOLINE)** 10 MG Intravenous Q6HPRN SBP > 180 mmHg for 60 Days, Clinician Dir:IV PUSH FOR ADULTS PER IV GUIDELINE           -   ** ** ** **INSULIN REGULAR HUMAN (HUMULIN R)******** Sliding Scale Subcutaneous Q6H for 60 Days      For Glucose 80 to 150 Give 0 Units    For Glucose 151 to 200 Give 2 Units    For Glucose 201 to 250 Give 3 Units    For Glucose 251 to 300 Give 4 Units    Notify Physician if: Call HO for glucose > 300          -   **BISACODYL (DULCOLAX)** 10 MG Rectal QDAYPRN Constipation for 60 Days, Clinician Dir:NOT RELIEVED BY PO           -   **SODIUM CL 0.9% FOR INH (STOCK) (SODIUM CHLORIDE NEB(STOCK))** 3 ML Inhalation Q4H for 60 Days           -   **SENNA (SENOKOT)** 17.2 MG Oral QDAY for 60 Days           -   **ACETAMINOPHEN (TYLENOL)** 650 MG Oral Q6HPRN Pain, Fever, Clinician Dir:NOT TO EXCEED 4 G APAP PER DAY           -   **TAMSULOSIN (FLOMAX)** 0.4 MG Oral QHS for 60 Days           -   **ATORVASTATIN (LIPITOR)** 80  MG Oral QHS for 60 Days           -   **BRIMONIDINE 0.2% OPHTH (ALPHAGAN 0.2% OPHTH)** 1 DROP Both Eyes SOL TID for 60 Days           -   **DORZOLAMIDE 2% OPHTH (TRUSOPT 2% OPHTH)** 1 DROP Both Eyes SOL BID for 60 Days           -   **ASPIRIN CHEWABLE** 81 MG Oral QDAY for 60 Days           -   **IPRATROPIUM/ALBUTEROL SULFATE (DUONEB)** 3 ML Inhalation Q6H for 60 Days           -   **LACTULOSE (CEPHULAC)** 10 G Oral QHSPRN Constipation for 60 Days           -   **HEPARIN** 5000 UNITS Subcutaneous Q8H First Dose Now for 60 Days           -   **BISACODYL E.C. (BISACODYL)** 10 MG Oral QDAYPRN Constipation for 60 Days           -   **BUDESONIDE INHALATION (PULMICORT  RESPULES)** 1 MG Inhalation BID First Dose Now for 60 Days           Comment 41M w/ pmhx significant for HFrEF (BiV dysfunction with EF 20-25% and  severely reduced RV function 05/2019), COPD (not on home O2 and current tobacco  use), pulmonary nodules, HTN, Type II DM and Schizoaffective disorder, who  presented with shortness of breath and fevers 2/24 and was found to have  hypoxic and hypercapnic respiratory failure concerning for pneumonia,  decompensated heart failure, and COPD exacerbation. Transfered out of MICU  3/4.            #Acute hypoxic and hypercapneic respiratory failure    #Pneumonia    #Acute exacerbation of COPD (not on home O2)    #Pulmonary Nodules    H/o COPD (last PFTS (07/21/19) FEV1 24% with FEV1/FVC 40% GOLD IV), prior  admission 01/2020 for Pseudomonas pneumonia. Presented with 3 days of SOB,  fevers, and difficulty walking 2/2 DOE. Found to be febrile, with  leukocytosis, and CT revealing consolidative opacity with surrounding patchy  groundglass opacities involving the entire left lower lobe concerning for pna.  Likely overall acute hypoxic respiratory failure multifactorial in nature,  primary pneumonia (WBC, fever, imaging) with component of CHF exacerbation  (edematous, wt gain) and COPD exacerbation (wheezing on exam, mild respiratory  acidosis). Admitted to MICU d/t high flow requirement. Started on cefepime  (prior PSA) and azithromycin 2/24 - 2/27) and then placed on Meropenam for 7d  course. Sputum unable to be produce for cx. Diuresed w/ initial poor response.  Also started on Prednisone for COPD component (2/24 - 2/26) and restarted IV  solumederol due to worsening status once prednisone stopped. Urine  legionella/strep negative. Initial improvement, then with periods of worsening  hypercapnia and somnolence. Managed with up-titration of BiPAP settings and  increased steroid dose. Weaned to NC 3/3.    - Titrate supplemental oxygen as tolerated for goal SpO2 88-92%; goal stay  off  NIPPV if able, if rescue needed had been up to 22/7.    - Continue Solumedrol 60mg  (started 3/2- ), wean q6h --> q12h-->q24hr -->PO  pred 40mg  starting 3/5 with plan to taper.    - Continue standing Duonebs, Budesonide, mucolytics.    - Chest PT q4hr and Saline nebs q4hr.    - Follow-up chest CT in 6-8 weeks  for pulmonary nodules.        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 Initial picture of likely volume overload leading to  acute hypoxic respiratory failure. EKG without ischemic findings, trop peaked  0.12 and down-trended. BNP 737 (down from 1085 01/2020 admit), weight up 84kg  (dry wt 75kg). Previous admits for CHF, unclear follow up (planned to f/u w/  Renne Crigler). At that time discharged on Toprol, last filled Lopressor 25mg   BID 04/2020, unclear current BB regimen ongoing vs d/c'd. Previously on  Aldactone, d/c'd d/t hyperkalemia. Planned to consider ACE-I/SGLT-2  outpatient.    TTE 2/25 noted known findings of BiV dysfunction (EF 20-25%), RV  moderately/severely dilated and severely reduced RV, RA dilation, elevated RA  pressure, RV overload mild MR, severe TR, trace AR, moderate PR. Aggressively  diuresed with Lasix infusion, good volume output, improved respiratory status  and edema. Have since held and UOP remains brisk without intervention.    - GDMT:    --- C/w metoprolol 6.25mg  q6h.    --- C/w captopril 6.25mg  tid.    - Hold on further diuresis, UOP adequate without intervention; goal even to  net negative; will give Lasix 40mg  PO this morning.    - Decrease hydralazine 25mg  tid to allow for uptitration of GDMT    - Would benefit  from RHC +/- this admit to further evaluate ? PAH.    - Consider inpatient HF consult for GDMT optimization as patient did not  follow up outpatient.    - Continue home ASA and Atorvastatin.        #AKI    #Metabolic Alkaolsis    #Hyperkalemia - Resolved    #Hypernatremia    Worsening kidney function from baseline Cr 1.4. Peak Cr of 4.14 likely  secondary to cardiorenal syndrome. Cr slowly improving. Aggressively diuresed  (>9L negative) with Lasix / Diuril with improvement in respiratory status /  edema, also development of likely contraction metabolic alkalosis.  Intermittent Diamox dosing. Now with development of worsening hypernatremia.  Brisk UOP despite holding further diuresis likely represents post-ATN  autodiuresis.    - Lasix 40mg  PO today, goal even to net negative; diuresis per above.    - Decrease FWF 300 q12h. If worsening will need to consider D5W.        #Toxic Metabolic Encephelopathy - Improving    #Schizoaffective disorder    Reportedly baseline memory issues/confusion. Concern during course for  gradually worsening mental status in setting of increasing hypercarbia,  managed previously w/ BiPAP etc. Also d/t concern for aspiration made NPO and  several home maintenance meds were hold. Required Precedex in ICU. Now remains  intermittently delirious though improving. Likely some ICU delirium as well.    - Cont home Carbamazepine 400mg  qAM / 600mg  qHS.    - Cont home Mirtazapine 15mg  qHS.    - Cont home Zyprexa 10mg  qHS.    - Cont home Seroquel 300mg  qHS.    - EKG for QTc.        #Nutrition    #Constipation    Mental status worsening in setting  of hypercarbia / delirium, made NPO after  SLP eval. Small-bore DHT placed 3/2.    - Nutrition consult, start TF per recs.    - SLP team to follow for readiness.    - Continue Colace; PRN Dulcolax if needed.        #T2DM    Last A1C 7.5 (01/2020)    - Holding home Metformin.    - SSI q6h.    - Start Lantus 4u qhs tonight given persistent hyperglycemia,  especially on  steroids.            FULL CODE    NPO + TF via DHT    SQH  PPI stopped    Daughter: Hulda Humphrey: [phone]    Social: Pt lives in a group home.    **Assessment and Plan**    Medication              -   **HYDRALAZINE (APRESOLINE)** 25 MG Oral TID for 57 Days           -   **BISACODYL (DULCOLAX)** 10 MG Rectal ONE TIME for 1 Doses           -   **FUROSEMIDE (LASIX)** 40 MG Oral QDAY First Dose Now for 60 Days           -   **PREDNISONE (DELTASONE)** 40 MG Oral QDAY First Dose Now for 60 Days           -   **INSULIN GLARGINE(LANTUS) (INSULIN LANTUS)** 4 UNITS Subcutaneous QHS for 60 Days           -   **POLYETHYLENE GLYCOL (MIRALAX)** 17 G Oral BID for 60 Days           -   **METOPROLOL (LOPRESSOR)** 6.25 MG Oral QID for 60 Days           -   **CAPTOPRIL (CAPOTEN)** 6.25 MG Oral TID First Dose Now for 60 Days           -   **OLANZAPINE (ZYPREXA)** 10 MG Oral QHS for 60 Days           -   **QUETIAPINE FUMARATE (SEROQUEL)** 300 MG Oral QHS for 60 Days           -   **MIRTAZAPINE (REMERON)** 15 MG Oral QHS for 60 Days           -   **CARBAMAZEPINE (TEGRETOL)** 600 MG Orogastric Tube QPM for 60 Days           -   **GUAIFENESIN SYRUP** 200 MG Oral Q4H First Dose Now           -   **CARBAMAZEPINE (TEGRETOL)** 400 MG Orogastric Tube QAM for 60 Days           -   **DOCUSATE SODIUM** 100 MG Nasogastric BID for 60 Days           -   **HYDRALAZINE (APRESOLINE)** 10 MG Intravenous Q6HPRN SBP > 180 mmHg for 60 Days, Clinician Dir:IV PUSH FOR ADULTS PER IV GUIDELINE           -   ** ** ** **INSULIN REGULAR HUMAN (HUMULIN R)******** Sliding Scale Subcutaneous Q6H for 60 Days      For Glucose 80 to 150 Give 0 Units    For Glucose 151 to 200 Give 2 Units    For Glucose 201 to 250 Give 3 Units    For Glucose 251 to 300 Give 4  Units    Notify Physician if: Call HO for glucose > 300          -   **BISACODYL (DULCOLAX)** 10 MG Rectal QDAYPRN Constipation for 60 Days, Clinician Dir:NOT  RELIEVED BY PO           -   **SODIUM CL 0.9% FOR INH (STOCK) (SODIUM CHLORIDE NEB(STOCK))** 3 ML Inhalation Q4H for 60 Days           -   **SENNA (SENOKOT)** 17.2 MG Oral QDAY for 60 Days           -   **ACETAMINOPHEN (TYLENOL)** 650 MG Oral Q6HPRN Pain, Fever, Clinician Dir:NOT TO EXCEED 4 G APAP PER DAY           -   **TAMSULOSIN (FLOMAX)** 0.4 MG Oral QHS for 60 Days           -   **ATORVASTATIN (LIPITOR)** 80 MG Oral QHS for 60 Days           -   **BRIMONIDINE 0.2% OPHTH (ALPHAGAN 0.2% OPHTH)** 1 DROP Both Eyes SOL TID for 60 Days           -   **DORZOLAMIDE 2% OPHTH (TRUSOPT 2% OPHTH)** 1 DROP Both Eyes SOL BID for 60 Days           -   **ASPIRIN CHEWABLE** 81 MG Oral QDAY for 60 Days           -   **IPRATROPIUM/ALBUTEROL SULFATE (DUONEB)** 3 ML Inhalation Q6H for 60 Days           -   **LACTULOSE (CEPHULAC)** 10 G Oral QHSPRN Constipation for 60 Days           -   **HEPARIN** 5000 UNITS Subcutaneous Q8H First Dose Now for 60 Days           -   **BISACODYL E.C. (BISACODYL)** 10 MG Oral QDAYPRN Constipation for 60 Days           -   **BUDESONIDE INHALATION (PULMICORT RESPULES)** 1 MG Inhalation BID First Dose Now for 60 Days           Comment 81M w/ pmhx significant for HFrEF (BiV dysfunction with EF 20-25% and  severely reduced RV function 05/2019), COPD (not on home O2 and current tobacco  use), pulmonary nodules, HTN, Type II DM and Schizoaffective disorder, who  presented with shortness of breath and fevers 2/24 and was found to have  hypoxic and hypercapnic respiratory failure concerning for pneumonia,  decompensated heart failure, and COPD exacerbation. Transfered out of MICU  3/4.            #Acute hypoxic and hypercapneic respiratory failure    #Pneumonia    #Acute exacerbation of COPD (not on home O2)    #Pulmonary Nodules    H/o COPD (last PFTS (07/21/19) FEV1 24% with FEV1/FVC 40% GOLD IV), prior  admission 01/2020 for Pseudomonas pneumonia. Presented with 3 days of  SOB,  fevers, and difficulty walking 2/2 DOE. Found to be febrile, with  leukocytosis, and CT revealing consolidative opacity with surrounding patchy  groundglass opacities involving the entire left lower lobe concerning for pna.  Likely overall acute hypoxic respiratory failure multifactorial in nature,  primary pneumonia (WBC, fever, imaging) with component of CHF exacerbation  (edematous, wt gain) and COPD exacerbation (wheezing on exam, mild respiratory  acidosis). Admitted to MICU d/t high flow requirement. Started on cefepime  (prior PSA) and azithromycin  2/24 - 2/27) and then placed on Meropenam for 7d  course. Sputum unable to be produce for cx. Diuresed w/ initial poor response.  Also started on Prednisone for COPD component (2/24 - 2/26) and restarted IV  solumederol due to worsening status once prednisone stopped. Urine  legionella/strep negative. Initial improvement, then with periods of worsening  hypercapnia and somnolence. Managed with up-titration of BiPAP settings and  increased steroid dose. Weaned to NC 3/3.    - Titrate supplemental oxygen as tolerated for goal SpO2 88-92%; goal stay  off NIPPV if able, if rescue needed had been up to 22/7.    - Continue Solumedrol 60mg  (started 3/2- ), wean q6h --> q12h-->q24hr -->PO  pred 40mg  starting 3/5 with plan to taper.    - Continue standing Duonebs, Budesonide, mucolytics.    - Chest PT q4hr and Saline nebs q4hr.    - Follow-up chest CT in 6-8 weeks for pulmonary nodules.        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 Initial picture of likely volume overload leading to  acute hypoxic  respiratory failure. EKG without ischemic findings, trop peaked  0.12 and down-trended. BNP 737 (down from 1085 01/2020 admit), weight up 84kg  (dry wt 75kg). Previous admits for CHF, unclear follow up (planned to f/u w/  Renne Crigler). At that time discharged on Toprol, last filled Lopressor 25mg   BID 04/2020, unclear current BB regimen ongoing vs d/c'd. Previously on  Aldactone, d/c'd d/t hyperkalemia. Planned to consider ACE-I/SGLT-2  outpatient.    TTE 2/25 noted known findings of BiV dysfunction (EF 20-25%), RV  moderately/severely dilated and severely reduced RV, RA dilation, elevated RA  pressure, RV overload mild MR, severe TR, trace AR, moderate PR. Aggressively  diuresed with Lasix infusion, good volume output, improved respiratory status  and edema. Have since held and UOP remains brisk without intervention.    - GDMT:    --- C/w metoprolol 6.25mg  q6h.    --- C/w captopril 6.25mg  tid.    - Hold on further diuresis, UOP adequate without intervention; goal even to  net negative; will give Lasix 40mg  PO this morning.    - Decrease hydralazine 25mg  tid to allow for uptitration of GDMT    - Would benefit from R[facility] +/- this admit to further evaluate ? PAH.    - Consider inpatient HF consult for GDMT optimization as patient did not  follow up outpatient.    - Continue home ASA and Atorvastatin.        #AKI    #Metabolic Alkaolsis    #Hyperkalemia - Resolved    #Hypernatremia    Worsening kidney function from baseline Cr 1.4. Peak Cr of 4.14 likely  secondary to cardiorenal syndrome. Cr slowly improving. Aggressively diuresed  (>9L negative) with Lasix / Diuril with improvement in respiratory status /  edema, also development of likely contraction metabolic alkalosis.  Intermittent Diamox dosing. Now with development of worsening hypernatremia.  Brisk UOP despite holding further diuresis likely represents post-ATN  autodiuresis.    - Lasix 40mg  PO today, goal even to net negative; diuresis  per above.    -  Decrease FWF 300 q12h. If worsening will need to consider D5W.        #Toxic Metabolic Encephelopathy - Improving    #Schizoaffective disorder    Reportedly baseline memory issues/confusion. Concern during course for  gradually worsening mental status in setting of increasing hypercarbia,  managed previously w/ BiPAP etc. Also d/t concern for aspiration made NPO and  several home maintenance meds were hold. Required Precedex in ICU. Now remains  intermittently delirious though improving. Likely some ICU delirium as well.    - Cont home Carbamazepine 400mg  qAM / 600mg  qHS.    - Cont home Mirtazapine 15mg  qHS.    - Cont home Zyprexa 10mg  qHS.    - Cont home Seroquel 300mg  qHS.    - EKG for QTc.        #Nutrition    #Constipation    Mental status worsening in setting of hypercarbia / delirium, made NPO after  SLP eval. Small-bore DHT placed 3/2.    - Nutrition consult, start TF per recs.    - SLP team to follow for readiness.    - Continue Colace; PRN Dulcolax if needed.        #T2DM    Last A1C 7.5 (01/2020)    - Holding home Metformin.    - SSI q6h.    - Start Lantus 4u qhs tonight given persistent hyperglycemia, especially on  steroids.            FULL CODE    NPO + TF via DHT    SQH  PPI stopped    Daughter: Hulda Humphrey: [phone]    Social: Pt lives in a group home.            **Electronically signed by Arnetha Gula, MD on 07/14/2020 14:36**        **Electronically signed by Arnetha Gula, MD on 07/14/2020 14:36**

## 2020-07-04 NOTE — Progress Notes (Signed)
 **Subjective**    Reason for Referral AKI.    Subjective Cr stable.    Autodiuresing. SpO2 95% on 2L NC. .    **Subjective**    Reason for Referral AKI.    Subjective Cr stable.    Autodiuresing. SpO2 95% on 2L NC. .    **Exam**    Vital Signs    07/11/2020 11:01              -  How Obtained: Bed Scale          -  Weight: 66.2kg      07/11/2020 09:54              -  O2 Amount: 6.0 LPM          -  Morse Fall Risk Total: 50      CFS Vital Signs CS    07/11/2020 17:00              -  Pain: 0          -  Riker: 4 - Calm, awakens easily,follows commands          -  Heart Rate: 103          -  Cardiac Rhythm: ST - Sinus Tachycardia          -  BP Systolic: 155          -  BP Diastolic: 62          -  MAP: 91          -  BP Site: Right Arm          -  BP Position: Lying          -  BP Source: Arterial          -  Respirations: 25          -  SPO2 %: 93 (89-100)          -  O2 Amount: 2.0 LPM          -  Mode: Nasal Cannula          -  High Fall Risk Interventions: Yes          -  HOB Elevation: 30          -  Enteral Feeding: Nutren 1.0          -  Enteral Feeding Rate: 20cc/hr          -  IV Infusion 1: Normal Saline (KVO/Meds)          -  IV Dose 1: 5 ml/hr          -  ml/hour 1: 5 ml/hr          -  IV Infusion 2: Dexmedetomidine 460mcg/100ml          -  IV Dose 2: off          -  ml/hour 2: off      07/11/2020 16:00              -  Oral Hygiene Q4: Yes          -  Foley care per protocol: Yes      07/11/2020 15:00              -  Temp Calc: 37.00          -  Temp Site: Oral      07/11/2020  14:59              -  Shift Intake Total: 1125.30ml          -  Shift Output Total:          -  Shift Balance: 610.45ml      07/11/2020 14:22              -  Comment: TF started, advance by 20 q6. Goal 70      07/11/2020 08:00              -  Medication Name 1: Acetazolamide       07/11/2020 06:00              -  FiO2: 35          -  Set Rate: 14           -  TV: 470          -  PS: 10          -  Peep: 5      07/11/2020 04:00              -  BP Systolic-2: 137          -  BP Diastolic-2: 52          -  MAP-2: 75          -  Site-2: Right Arm          -  Position-2: Lying          -  Source-2: Arterial      07/10/2020 20:00              -  BP Systolic 2: 142          -  BP Diastolic 2: 56          -  BP Site 2: 81          -  BP Position 2: Lying          -  BP Source 2: Arterial          -  Repositioned Side: Back      07/10/2020 17:59              -  Spontaneous Rate: 22          -  Spont TV: 680      07/10/2020 09:38              -  Call Bell Within Reach: Yes          I have reviewed and agree with vital signs as listed in the EMR Yes Time  07/11/2020 17:52.    Comment General: On nasal cannula, no visible distress, alert / interactive    CV: RRR with no m/r/g    Pulm: Clear lungs bilaterally, no audible rhonchi / wheezing / rales    GI: soft, NT/NT    Extremities: No pitting edema in lower extremity edema    Skin: no rashes.    **Exam**    Vital Signs    07/11/2020 11:01              -  How Obtained: Bed Scale          -  Weight: 66.2kg      07/11/2020 09:54              -  O2 Amount: 6.0  LPM          -  Morse Fall Risk Total: 50      CFS Vital Signs CS    07/11/2020 17:00              -  Pain: 0          -  Riker: 4 - Calm, awakens easily,follows commands          -  Heart Rate: 103          -  Cardiac Rhythm: ST - Sinus Tachycardia          -  BP Systolic: 155          -  BP Diastolic: 62          -  MAP: 91          -  BP Site: Right Arm          -  BP Position: Lying          -  BP Source: Arterial          -  Respirations: 25          -  SPO2 %: 93 (89-100)          -  O2 Amount: 2.0 LPM          -  Mode: Nasal Cannula          -  High Fall Risk Interventions: Yes          -  HOB Elevation: 30          -  Enteral Feeding: Nutren 1.0          -  Enteral Feeding Rate: 20cc/hr           -  IV Infusion 1: Normal Saline (KVO/Meds)          -  IV Dose 1: 5 ml/hr          -  ml/hour 1: 5 ml/hr          -  IV Infusion 2: Dexmedetomidine 466mcg/100ml          -  IV Dose 2: off          -  ml/hour 2: off      07/11/2020 16:00              -  Oral Hygiene Q4: Yes          -  Foley care per protocol: Yes      07/11/2020 15:00              -  Temp Calc: 37.00          -  Temp Site: Oral      07/11/2020 14:59              -  Shift Intake Total: 1125.70ml          -  Shift Output Total:          -  Shift Balance: 610.22ml      07/11/2020 14:22              -  Comment: TF started, advance by 20 q6. Goal 70      07/11/2020 08:00              -  Medication Name 1: Acetazolamide       07/11/2020 06:00              -  FiO2: 35          -  Set Rate: 14          -  TV: 470          -  PS: 10          -  Peep: 5      07/11/2020 04:00              -  BP Systolic-2: 137          -  BP Diastolic-2: 52          -  MAP-2: 75          -  Site-2: Right Arm          -  Position-2: Lying          -  Source-2: Arterial      07/10/2020 20:00              -  BP Systolic 2: 142          -  BP Diastolic 2: 56          -  BP Site 2: 81          -  BP Position 2: Lying          -  BP Source 2: Arterial          -  Repositioned Side: Back      07/10/2020 17:59              -  Spontaneous Rate: 22          -  Spont TV: 680      07/10/2020 09:38              -  Call Bell Within Reach: Yes          I have reviewed and agree with vital signs as listed in the EMR Yes Time  07/11/2020 17:52.    Comment General: On nasal cannula, no visible distress, alert / interactive    CV: RRR with no m/r/g    Pulm: Clear lungs bilaterally, no audible rhonchi / wheezing / rales    GI: soft, NT/NT    Extremities: No pitting edema in lower extremity edema    Skin: no rashes.    **Assessment and Plan**    Medication              -   **OLANZAPINE (ZYPREXA)** 10 MG Oral QHS for 60  Days   Pending Date: 07/11/2020          -   **QUETIAPINE FUMARATE (SEROQUEL)** 300 MG Oral QHS for 60 Days   Pending Date: 07/11/2020          -   **MIRTAZAPINE (REMERON)** 15 MG Oral QHS for 60 Days   Pending Date: 07/11/2020          -   **METHYLPREDNISOLONE (SOLU-MEDROL)** 60 MG Intravenous Q12H for 59 Days, Clinician Dir:IV PUSH OVER 3 -5 MINUTES   Pending Date: 07/11/2020          -   **CARBAMAZEPINE (TEGRETOL)** 600 MG Orogastric Tube QPM for 60 Days   Pending Date: 07/11/2020          -   **GUAIFENESIN SYRUP** 200 MG Oral Q4H First Dose Now           -   **DOCUSATE SODIUM** 100 MG Nasogastric BID for 60 Days           -   **  CARBAMAZEPINE (TEGRETOL)** 400 MG Orogastric Tube QAM for 60 Days           -   **DEXMEDETOMIDINE 400 MCG/100 ML (PRECEDEX PREMIX)** CONTINUOUS Intravenous INFUSION for 60 Days, Clinician [id number] TO RIKER SAS 3-4 START AT 0.2 MCG/KG/HR. MAY INCREASE BY 0.1 MCG/KG/HR Q15MIN TO A MAX OF 1.5 MCG/KG/HR.           -   **HYDRALAZINE (APRESOLINE)** 10 MG Intravenous Q6HPRN SBP > 180 mmHg for 60 Days, Clinician Dir:IV PUSH FOR ADULTS PER IV GUIDELINE           -   **HYDRALAZINE (APRESOLINE)** 50 MG Oral TID First Dose Now for 60 Days           -   ** ** ** **INSULIN REGULAR HUMAN (HUMULIN R)******** Sliding Scale Subcutaneous Q6H for 60 Days      For Glucose 80 to 150 Give 0 Units    For Glucose 151 to 200 Give 2 Units    For Glucose 201 to 250 Give 3 Units    For Glucose 251 to 300 Give 4 Units    Notify Physician if: Call HO for glucose > 300          -   **BISACODYL (DULCOLAX)** 10 MG Rectal QDAYPRN Constipation for 60 Days, Clinician Dir:NOT RELIEVED BY PO           -   **SODIUM CL 0.9% FOR INH (STOCK) (SODIUM CHLORIDE NEB(STOCK))** 3 ML Inhalation Q4H for 60 Days           -   **SENNA (SENOKOT)** 17.2 MG Oral QDAY for 60 Days           -   **ACETAMINOPHEN (TYLENOL)** 650 MG Oral Q6HPRN Pain, Fever, Clinician Dir:NOT TO EXCEED 4 G APAP PER DAY           -    **PANTOPRAZOLE (PROTONIX)** 40 MG Intravenous QDAY First Dose Now for 60 Days           -   **ATORVASTATIN (LIPITOR)** 80 MG Oral QHS for 60 Days           -   **TAMSULOSIN (FLOMAX)** 0.4 MG Oral QHS for 60 Days           -   **DORZOLAMIDE 2% OPHTH (TRUSOPT 2% OPHTH)** 1 DROP Both Eyes SOL BID for 60 Days           -   **ASPIRIN CHEWABLE** 81 MG Oral QDAY for 60 Days           -   **BRIMONIDINE 0.2% OPHTH (ALPHAGAN 0.2% OPHTH)** 1 DROP Both Eyes SOL TID for 60 Days           -   **IPRATROPIUM/ALBUTEROL SULFATE (DUONEB)** 3 ML Inhalation Q6H for 60 Days           -   **LACTULOSE (CEPHULAC)** 10 G Oral QHSPRN Constipation for 60 Days           -   **HEPARIN** 5000 UNITS Subcutaneous Q8H First Dose Now for 60 Days           -   **BISACODYL E.C. 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    - now with post ATN diuresis -        - Cr stable, UOP remains high on its own. Diamox on for alkalosis, can stop  once bicarb <30        #) Hypernatremia    From water diuresis    Has 3.1 Free water deficit    Please start FWF and D5W as tolerated to achieve this .    **Assessment and Plan**    Medication              -   **OLANZAPINE (ZYPREXA)** 10 MG Oral QHS for 60 Days   Pending Date: 07/11/2020          -   **QUETIAPINE FUMARATE  (SEROQUEL)** 300 MG Oral QHS for 60 Days   Pending Date: 07/11/2020          -   **MIRTAZAPINE (REMERON)** 15 MG Oral QHS for 60 Days   Pending Date: 07/11/2020          -   **METHYLPREDNISOLONE (SOLU-MEDROL)** 60 MG Intravenous Q12H for 59 Days, Clinician Dir:IV PUSH OVER 3 -5 MINUTES   Pending Date: 07/11/2020          -   **CARBAMAZEPINE (TEGRETOL)** 600 MG Orogastric Tube QPM for 60 Days   Pending Date: 07/11/2020          -   **GUAIFENESIN SYRUP** 200 MG Oral Q4H First Dose Now           -   **DOCUSATE SODIUM** 100 MG Nasogastric BID for 60 Days           -   **CARBAMAZEPINE (TEGRETOL)** 400 MG Orogastric Tube QAM for 60 Days           -   **DEXMEDETOMIDINE 400 MCG/100 ML (PRECEDEX PREMIX)** CONTINUOUS Intravenous INFUSION for 60 Days, Clinician WUJ:[id number] TO RIKER SAS 3-4 START AT 0.2 MCG/KG/HR. MAY INCREASE BY 0.1 MCG/KG/HR Q15MIN TO A MAX OF 1.5 MCG/KG/HR.           -   **HYDRALAZINE (APRESOLINE)** 10 MG Intravenous Q6HPRN SBP > 180 mmHg for 60 Days, Clinician Dir:IV PUSH FOR ADULTS PER IV GUIDELINE           -   **HYDRALAZINE (APRESOLINE)** 50 MG Oral TID First Dose Now for 60 Days           -   ** ** ** **INSULIN REGULAR HUMAN (HUMULIN R)******** Sliding Scale Subcutaneous Q6H for 60 Days      For Glucose 80 to 150 Give 0 Units    For Glucose 151 to 200 Give 2 Units    For Glucose 201 to 250 Give 3 Units    For Glucose 251 to 300 Give 4 Units    Notify Physician if: Call HO for glucose > 300          -   **BISACODYL (DULCOLAX)** 10  MG Rectal QDAYPRN Constipation for 60 Days, Clinician Dir:NOT RELIEVED BY PO           -   **SODIUM CL 0.9% FOR INH (STOCK) (SODIUM CHLORIDE NEB(STOCK))** 3 ML Inhalation Q4H for 60 Days           -   **SENNA (SENOKOT)** 17.2 MG Oral QDAY for 60 Days           -   **ACETAMINOPHEN (TYLENOL)** 650 MG Oral Q6HPRN Pain, Fever, Clinician Dir:NOT TO EXCEED 4 G APAP PER DAY           -   **PANTOPRAZOLE (PROTONIX)** 40 MG Intravenous QDAY First Dose Now  for 60 Days           -   **ATORVASTATIN (LIPITOR)** 80 MG Oral QHS for 60 Days           -   **TAMSULOSIN (FLOMAX)** 0.4 MG Oral QHS for 60 Days           -   **DORZOLAMIDE 2% OPHTH (TRUSOPT 2% OPHTH)** 1 DROP Both Eyes SOL BID for 60 Days           -   **ASPIRIN CHEWABLE** 81 MG Oral QDAY for 60 Days           -   **BRIMONIDINE 0.2% OPHTH (ALPHAGAN 0.2% OPHTH)** 1 DROP Both Eyes SOL TID for 60 Days           -   **IPRATROPIUM/ALBUTEROL SULFATE (DUONEB)** 3 ML Inhalation Q6H for 60 Days           -   **LACTULOSE (CEPHULAC)** 10 G Oral QHSPRN Constipation for 60 Days           -   **HEPARIN** 5000 UNITS Subcutaneous Q8H First Dose Now for 60 Days           -   **BISACODYL E.C. 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    - now with post ATN diuresis -        - Cr stable, UOP remains high on its own. Diamox on for alkalosis, can stop  once bicarb <30        #) Hypernatremia    From water diuresis    Has 3.1 Free water deficit    Please start FWF and D5W as tolerated to achieve this .            **Electronically signed by Dorris Fetch, MD on 07/11/2020 17:58**        **Electronically signed by Dorris Fetch, MD on 07/11/2020 17:58**

## 2020-07-04 NOTE — ED Provider Notes (Signed)
 [name]  Name: Arthur Young, Arthur Young  MRN: [id number]  Age: 55 yrs  Sex: Male  DOB: 05-Jun-1965  Arthur Young Date: 07/04/2020  Arrival Time: 16:18  Account#: [id number]  .  Working Diagnosis: Shortness of breath  PCP: Berenice Primas  .  HPI:  02/24  16:53 This 55 yrs old Asian Male presents to ER via EMS with          np9        complaints of Shortness Of Breath.  16:53 55 yo male with past medical history significant for HFrEF (EF  np9        30% 05/2019), severe COPD (not on home O2), Schizoaffective        disorder, HTN and Type II DM that presents by EMS from group        home for dyspnea, fever. Per EMS report dyspneic for several        days, today appeared to be SOB at rest leading group home staff        to call 911. On EMS arrival O2 sat on RA 80%, inspiratory and        expiratory wheezing. Pt received 2 duonebs during transport.        Arrives on 6L NC. Febrile to 103 on arrival. Pt reports he        feels much better after the duonebs administered during        transport. He reports several days of bilateral LE edema. He is        COVID vaccinated. .  .  Historical:  - Allergies: Depakote; Lithium Carbonate;  - PMHx: Hypertension; CHF; COPD; Cataract; Diabetes-NIDDM;    Glaucoma;  - The history from nurses notes was reviewed: and I agree with    what is documented.  .  .  ROS:  19:14 Unable to obtain Review of Systems due to patient distress.     np9  19:15 Constitutional: Positive for fever.                             np9  19:15 ENT: Negative for sore throat.  19:15 Cardiovascular: Negative for chest pain.  19:15 Respiratory: Positive for cough, shortness of breath.  19:15 MS/extremity: Positive for leg swelling.  .  Vital Signs:  16:21 BP 127 / 70 (auto/reg); Pulse 126; Resp 30; Temp 40.2(O); Pulse kh29        Ox 96% on 6 lpm NC;  16:40 Pulse 124; Resp 39; Temp 39.4; Pulse Ox 91% on 6 lpm NC;        vd7  16:46 BP 112 / 66;                                                    vd7  17:34 Pulse 115 Monitor; Pulse Ox 98% on NC;                           vd7  18:18 BP 122 / 71; Pulse 117; Resp 29; Pulse Ox 98% on NC;            vd7  18:41 Pulse 113 Monitor; Resp 28; Temp 37.4(O); Pulse Ox 99% on NC;   vd7  19:14 BP 106 / 64; Pulse 109;  Resp 25; Pulse Ox 100% on NC;           vd7  20:27 BP 92 / 63; Pulse 102; Resp 22;                                 ec22  .  Name:Arthur Young, Arthur Young  [id number]  [id number]  Page 1 of 7  %%PAGE  .  Name: Arthur Young, Arthur Young  MRN: [id number]  Age: 30 yrs  Sex: Male  DOB: [birth year]  Arrival Date: 07/04/2020  Arrival Time: 16:18  Account#: [id number]  .  Working Diagnosis: Shortness of breath  PCP: Edyth Gunnels, Amy  .  22:18 BP 94 / 64; Pulse 96; Resp 20; Pulse Ox 100% on 15%             ec22        Non-rebreather mask;  17:34 Sinus tachycardia                                               vd7  18:41 Sinus tachycardia                                               vd7  17:34 High flow                                                       vd7  18:18 high flo                                                        vd7  18:41 High Flo                                                        vd7  19:14 high flo 40L/min 100% fio2                                      vd7  20:27 Pt on 100% on 40% hi flow.                                      ec22  .  Glasgow Coma Score:  16:21 Eye Response: spontaneous(4). Verbal Response: oriented(5).     kh29        Motor Response: obeys commands(6). Total: 15.  16:40 Eye Response: spontaneous(4). Verbal Response: oriented(5).     vd7        Motor Response: obeys commands(6). Total: 15.  16:41 Eye Response: spontaneous(4). Verbal Response: oriented(5).  vd7        Motor Response: obeys commands(6). Total: 15.  .  Exam:  18:45 Constitutional:  This is a well developed, well nourished       np9        patient who is awake, alert, and in moderate respiratory        distress, appears anxious and uncomfortable Head/Face:        Normocephalic, atraumatic. Eyes:  Pupils equal round and        reactive to  light, extra-ocular motions intact.   ENT:  Mucous        membranes dry  19:06 Neck:   Supple, full range of motion  Respiratory:  Moderate    np9        respiratory distress. Worse with any movement. Tachypneic to        30's, O2 sats on 6L NC88%. Placed on high flow. Course breath        sounds diffusely Cardiovascular:  Tachycardic, regular rhythm        Abdomen/GI:  Soft, non-tender Back:  No spinal tenderness.  No        costovertebral tenderness.  Full range of motion. Skin:  Hot to        touch MS/ Extremity:  Pulses equal,  Neurovascular intact.        Full, normal range of motion. 2+ pitting edema to bilateral        shins Neuro:  Awake and alert, GCS 15, oriented to person,        place, time, and situation.  Cranial nerves II-XII grossly        intact.  Motor strength 5/5 in all extremities.  Sensory        grossly intact.  Gait not tested Psych:  Awake, alert, with        orientation to person, place and time.  Cooperative, affect        somewhat irritable  .  MDM:  16:46 ECG: I interpreted the patient's EKG. Shows sinus tachycardia   jms  .  Name:Arthur Young, Arthur Young  [id number]  [id number]  Page 2 of 7  %%PAGE  .  Name: Arthur Young, Arthur Young  MRN: [id number]  Age: 29 yrs  Sex: Male  DOB: 05/04/1966  Arrival Date: 07/04/2020  Arrival Time: 16:18  Account#: [id number]  .  Working Diagnosis: Shortness of breath  PCP: Edyth Gunnels, Amy  .        rate 124 bpm. The axis is normal. The axis is normal. There is        late transition across the precordial leads. No ischemic        changes are noted.  18:10 ED course: Patient was signed out to me at 18:00 hours awaiting ds27        return of labs BNP as well as sepsis work up. Respiratory will        return at 6:15 for reevaluation of reduction of high flow        settings. .  18:12 Differential diagnosis: CHF, COPD exacerbation, pneumonia,      jms        COVID 19. Data reviewed: nurses notes, radiologic studies, I        visualized the patient's chest radiograph. There is         cardiomegaly that seems improved from the patient's last chest        rate of graft. There is obscuration of the left hemidiaphragm.  No clear pulmonary infiltrates or pleural effusions are seen..  18:39 ED course: CXR FINDINGS/IMPRESSION: Cardiomediastinal           np9        silhouette is enlarged, stable. Hazy opacities in the bilateral        mid lung zones, which may reflect pulmonary edema, underlying        infection cannot be entirely excluded. Mediastinal lipomatosis,        obscuring the left costophrenic angle, trace pleural effusion        is not entirely excluded. No pneumothorax. No acute bony        findings. Chronic fracture of lateral fifth and sixth right        ribs. Visualized abdomen is within normal limits. DICTATED:        Denyce Robert ,MD SUPERVISED BY CR OVERNIGHT , IN ACCORDANCE        WITH DEPARTMENT POLICY, TEACHING PHYSICIANS REVIEW ALL IMAGES,        AND EDIT REPORTS AS REQUIRED. A REPORT IS NOT FINAL UNTIL        APPROVED BY A STAFF RADIOLOGIST. [name]  19:39 ED course: Labs: leukocytosis to 17, otherwise chem 7 at        np9        baseline, lactate 1.5. COVID negative. CXR as above. Pt treated        with 500cc boluls of NS given his CHF history. He received        broad spectrum abx. He is placed on high flow NC. Plan to keep        on initial high flow settings 40L/100% for 1 hr then attempt to        down titrate. Pt is signed out to PA Stowik at 6pm shift        change. Data reviewed: lab test result(s), nurses notes, old        medical records, vital signs. Transfer of Care to: Irine Seal. Response to treatment: Improved. My portion of the ED        chart is complete / electronically signed: Tera Helper,        PA-C.  22:12 ED course: Patient noted to have elevated WBC, SOB was          ds27        persistent. Patient is being admitted to MICU being switched        from Bipap to Hi flow. Patient expressed understanding and        agreement with this plan.  . My portion of the ED chart is        complete / electronically signed: Irine Seal PA-C.  [name]  Name:Arthur Young, Arthur Young  [id number]  [id number]  Page 3 of 7  %%PAGE  .  Name: Arthur Young, Arthur Young  MRN: [id number]  Age: 28 yrs  Sex: Male  DOB: 14-Apr-1966  Arrival Date: 07/04/2020  Arrival Time: 16:18  Account#: [id number]  .  Working Diagnosis: Shortness of breath  PCP: Berenice Primas  .  02/24  16:24 Order name: ALT/SPGT (Alanine Aminotransferase); Complete Time: abc        20:12  02/24  16:24 Order name: AST/SGOT (Aspartate Aminotranferase); Complete      abc        Time: 20:12  02/24  16:24 Order name: Alk Phos (Alkaline Phosphatase); Complete Time:     abc  20:12  02/24  16:24 Order name: BUN (Blood Urea Nitrogen); Complete Time: 20:12     abc  02/24  16:24 Order name: Bilirubin, Direct; Complete Time: 20:12             abc  02/24  16:24 Order name: Bilirubin, Total; Complete Time: 20:12              abc  02/24  16:24 Order name: Blood Culture 1 of 2; Complete Time: 20:12          abc  02/24  16:24 Order name: Blood Culture 2 of 2                                abc  02/24  16:24 Order name: CBC/Diff (With Plt); Complete Time: 20:12           abc  02/24  16:24 Order name: CR (Creatinine); Complete Time: 20:12               abc  02/24  16:24 Order name: GLU (Glucose); Complete Time: 20:12                 abc  02/24  16:24 Order name: LYTES (Na, K, Cl, Co2); Complete Time: 20:12        abc  02/24  16:24 Order name: Lactate; Complete Time: 20:12                       abc  02/24  16:24 Order name: Lipase; Complete Time: 20:12                        abc  02/24  16:24 Order name: PT (Prothrombin Time With INR); Complete Time: 20:12abc  02/24  16:24 Order name: PTT; Complete Time: 20:12                           abc  02/24  16:24 Order name: Troponin I; Complete Time: 20:12                    abc  02/24  16:24 Order name: UA w/Refl Cult; Complete Time: 20:12                abc  02/24  16:24 Order name: CXR (PA/Lat); Complete  Time: 20:12                  abc  02/24  .  Name:Arthur Young, Arthur Young  [id number]  [id number]  Page 4 of 7  %%PAGE  .  Name: Arthur Young, Arthur Young  MRN: [id number]  Age: 73 yrs  Sex: Male  DOB: [birth year]  Arrival Date: 07/04/2020  Arrival Time: 16:18  Account#: [id number]  .  Working Diagnosis: Shortness of breath  PCP: Berenice Primas  .  16:26 Order name: COVID-19 PCR (Symptomatic patients)                 np9  02/24  17:23 Order name: Glucose, Point of Care; Complete Time: 20:12        dispa  t  02/24  17:24 Order name: COVID-19 (SARS-CoV-2) PCR RAPID; Complete Time:     dispa  t        20:12  02/24  17:39 Order name: RBC Morph; Complete Time: 20:12  dispa  t  02/24  17:44 Order name: GFR, AA; Complete Time: 20:12                       dispa  t  02/24  17:44 Order name: 2021 eGFRcr; Complete Time: 20:12                   dispa  t  02/24  17:44 Order name: GFR, NAA; Complete Time: 20:12                      dispa  t  02/24  17:48 Order name: Blood Bank Hold; Complete Time: 20:12               dispa  t  02/24  18:39 Order name: ABG - Blood Gas; Complete Time: 20:12               vd7  02/24  18:58 Order name: BNP ; Complete Time: 20:12                          dispa  t  02/24  19:00 Order name: Urine Microscopic; Complete Time: 20:12             dispa  t  02/24  16:24 Order name: Adult EKG (order using folder); Complete Time: 16:25abc  02/24  16:24 Order name: AccuCheck(POC); Complete Time: 17:27                abc  02/24  16:24 Order name: EKG (order using folder); Complete Time: 16:46      abc  02/24  16:42 Order name: High flow nasal cannula; Complete Time: 17:27       np9  02/24  17:12 Order name: ADD BNP-BRAIN NATRIURETIC PROTEIN; Complete Time:   np9        17:18  02/24  18:15 Order name: Ct Chest                                            ds27  02/24  20:39 Order name: BiPAP; Complete Time: 21:07                         cc33  .  Dispensed Medications:  16:45 Drug: Tylenol 975 mg Route: PO;                                  vd7  17:49 Drug: Vancomycin pt wt >/= 95 kg - vancomycin 1500 mg Route: IV;vd7  19:53 Follow up: Response: No Adverse Reaction; IV Status: Completed  ec22  .  Name:Arthur Young, Arthur Young  [id number]  [id number]  Page 5 of 7  %%PAGE  .  Name: Arthur Young, Arthur Young  MRN: [id number]  Age: 32 yrs  Sex: Male  DOB: 1966-01-11  Arrival Date: 07/04/2020  Arrival Time: 16:18  Account#: [id number]  .  Working Diagnosis: Shortness of breath  PCP: Edyth Gunnels, Amy  .        infusion  19:53 Drug: Cefepime 2 grams Route: IV;                               ec22  20:26 Drug: Lasix - Furosemide 120 mg Route: IVPB; Infused Over: 60   ec22        mins;  .  .  Radiology Orders:  Order Name: CXR (PA/Lat); Last Status: Reviewed; Time: 07/04/20    16:24; By: Maren Beach; For: abc; Order Method: Electronic; Notes:    Bed Name: A5  Order Name: Ct Chest; Last Status: Returned; Time: 07/04/20    18:15; By: [email or web address]; For: ds27; Order Method: Electronic; Notes:    Bed Name: A5  Attending Notes:  18:10 Attestation: Assessment and care plan reviewed with             jms        resident/midlevel provider. See their note for details.        Physician Assistant's history reviewed, patient interviewed and        examined. Attending HPI: HPI: The patient arrives via EMS. EMS        with the patient called because of shortness of breath. They        found the patient to be tachypneic and have a high fever. The        patient reports shortness of breath, and malaise. No real        cough. Prehospital, EMS gave the patient nebulized        bronchodilators and they report his wheezing improved. The        patient was no chest pain, nausea, vomiting. Attending ROS As        documented by Physician Assistant ROS and HPI. Attending Exam:        My personal exam reveals The patient is a well-developed, large        Asian male. He is alert and conversant. Conjunctival are clear.        Respirations are a bit labored and tachypnea in the low 30s.        There are scattered  rhonchi. The patient's mentation is        slightly slow but he is conversant and cooperative. He moves        all 4 extremities. Speech is fluent. Cranial nerves are intact.        I have reviewed Nurses Notes. ED Course: The patient was        treated with IV fluids, and the biotics, and high flow oxygen.        His COVID 19 testing is negative. \\. My Working Impression:        Pneumonia, fever or shortness of breath. Attending chart        complete and electronically signed: J.M.Jeannett Senior MD [phone].  [name]  Disposition Summary:  07/04/20 21:43  Hospitalization Ordered        Hospitalization Status: Inpatient Admission                     ds27        Provider: Deliah Goody                                        ds27  .  Name:Arthur Young, Arthur Young  [id number]  [id number]  Page 6 of 7  %%PAGE  .  Name: Arthur Young, Arthur Young  MRN: [id number]  Age: 10 yrs  Sex: Male  DOB: 15-May-1965  Arrival Date: 07/04/2020  Arrival Time: 16:18  Account#: [id number]  .  Working Diagnosis: Shortness of breath  PCP: Berenice Primas  .        Condition: Stable                                               ds27        Problem: new                                                    ds27        Symptoms: are unchanged                                         ds27        Bed/Room Type: Regular                                          ds27        Location: Adult Floor(07/04/20 22:02)                           lr15        Room Assignment: [facility] A(07/04/20 22:02)                         lr15        Diagnosis          - Shortness of breath                                         ds27        Forms:          - Handoff Communication Form                                  ds27          - Fax Summary                                                 ds27  Critical Care Time:  18:10 Critical care time: Bedside Care: 20 minutes, Consultation: 15  jms        minutes. Total time: 35 minutes  .  Signatures:  Barbette Merino                          MD   jms  Dispatcher, [employer]                           dispa  [place], [place]  dw  Konrad Penta                     MD   abc  Fredric Dine                            Reg  dw5  Donnamarie Poag                           RN   lr15  Tera Helper                    PA-C np9  Wendee Copp, RRT                      RRT  Aron Baba  Merlene Laughter                            RN   kh29  [place]                         RN   vd7  Irine Seal                           PA-C ds27  Rogue Bussing                         RN   ec22  .  Corrections: (The following items were deleted from the chart)  19:39 16:53 55 yo male with past medical history significant for      np9        HFrEF (EF 30% 05/2019), severe COPD (not on home O2),        Schizoaffective disorder, HTN and Type II DM that presents by        EMS from group home for dyspnea, fever. [name] np9  21:47 21:43 ds27                                                      lr15  22:02 21:43 Critical Care ds27                                        lr15  22:02 21:47 "PENDING BED" - ICU lr15                                  lr15  .  Document is preliminary until electronically or manually signed by the atte  nding physician  .  .  .  .  Name:[place]

## 2020-07-04 NOTE — Progress Notes (Signed)
 **Subjective**    Subjective Overnight:    - Agitated during the course of the night. His medications for  schizoaffective disorder (seroquel, zyprexa, carbamazepine) were previously  held in the setting of his somnolence. He was started on precedex and his  carbamazepine was reordered given new enteral access. ABG continued to show  improvement in hypercarbia with pCO2 50s    - AM labs with worsening hypernatremia to 151. FWF uptitrated accordingly for  3.9L FWD        Interval:    - Delirious, no visible respiratory distress on nasal cannula, alert /  oriented to self only, agreeable and interactive, calm, on Precedex 0.6.    **Subjective**    Subjective Overnight:    - Agitated during the course of the night. His medications for  schizoaffective disorder (seroquel, zyprexa, carbamazepine) were previously  held in the setting of his somnolence. He was started on precedex and his  carbamazepine was reordered given new enteral access. ABG continued to show  improvement in hypercarbia with pCO2 50s    - AM labs with worsening hypernatremia to 151. FWF uptitrated accordingly for  3.9L FWD        Interval:    - Delirious, no visible respiratory distress on nasal cannula, alert /  oriented to self only, agreeable and interactive, calm, on Precedex 0.6.    **Vital Signs**    Vital Signs    07/10/2020 20:00              -  O2 Amount: 35%          -  Morse Fall Risk Total: 50      CFS Vital Signs CS    07/11/2020 06:59              -  Shift Intake Total:          -  Shift Output Total:          -  Shift Balance: -      07/11/2020 06:00              -  Riker: 4 - Calm, awakens easily,follows commands          -  Heart Rate: 85          -  BP Systolic: 146          -  BP Diastolic: 68          -  MAP: 85          -  BP Site: Left Arm          -  BP Position: Lying          -  BP Source: Arterial          -  Respirations: 22          -  SPO2 %: 96 (89-100)          -  Mode:  CPAP/BIPAP          -  FiO2: 35          -  Set Rate: 14          -  TV: 470          -  PS: 10          -  Peep: 5          -  IV Infusion 1: Normal Saline (KVO/Meds)          -  IV Dose 1: 5 ml/hr          -  ml/hour 1: 5 ml/hr          -  IV Infusion 2: Dexmedetomidine 450mcg/100ml          -  IV Dose 2: 0.6 mcg          -  ml/hour 2: 12.6          -  Medication Name 1: Acetazolamide       07/11/2020 05:00              -  Pain: 0      07/11/2020 04:00              -  Temp Calc: 36.80          -  Temp Site: Axillary          -  Cardiac Rhythm: NSR - Normal Sinus Rhythm          -  BP Systolic-2: 137          -  BP Diastolic-2: 52          -  MAP-2: 75          -  Site-2: Right Arm          -  Position-2: Lying          -  Source-2: Arterial      07/10/2020 20:00              -  BP Systolic 2: 142          -  BP Diastolic 2: 56          -  BP Site 2: 81          -  BP Position 2: Lying          -  BP Source 2: Arterial          -  Repositioned Side: Back          -  HOB Elevation: 30          -  Oral Hygiene Q4: Yes          -  Foley care per protocol: Yes      07/10/2020 17:59              -  Spontaneous Rate: 22          -  Spont TV: 680      07/10/2020 09:38              -  Call Alvester Morin Within Reach: Yes          -  High Fall Risk Interventions: Yes          I have reviewed and agree with vital signs as listed in the EMR Yes Time  07/11/2020 06:52.    **Vital Signs**    Vital Signs    07/10/2020 20:00              -  O2 Amount: 35%          -  Morse Fall Risk Total: 50      CFS Vital Signs CS    07/11/2020 06:59              -  Shift Intake Total:          -  Shift Output Total:          -  Shift Balance: -      07/11/2020 06:00              -  Riker: 4 - Calm, awakens easily,follows commands          -  Heart Rate: 85          -  BP Systolic: 146          -  BP Diastolic: 68          -  MAP: 85          -  BP  Site: Left Arm          -  BP Position: Lying          -  BP Source: Arterial          -  Respirations: 22          -  SPO2 %: 96 (89-100)          -  Mode: CPAP/BIPAP          -  FiO2: 35          -  Set Rate: 14          -  TV: 470          -  PS: 10          -  Peep: 5          -  IV Infusion 1: Normal Saline (KVO/Meds)          -  IV Dose 1: 5 ml/hr          -  ml/hour 1: 5 ml/hr          -  IV Infusion 2: Dexmedetomidine 486mcg/100ml          -  IV Dose 2: 0.6 mcg          -  ml/hour 2: 12.6          -  Medication Name 1: Acetazolamide       07/11/2020 05:00              -  Pain: 0      07/11/2020 04:00              -  Temp Calc: 36.80          -  Temp Site: Axillary          -  Cardiac Rhythm: NSR - Normal Sinus Rhythm          -  BP Systolic-2: 137          -  BP Diastolic-2: 52          -  MAP-2: 75          -  Site-2: Right Arm          -  Position-2: Lying          -  Source-2: Arterial      07/10/2020 20:00              -  BP Systolic 2: 142          -  BP Diastolic 2: 56          -  BP Site 2: 81          -  BP Position 2: Lying          -  BP Source 2: Arterial          -  Repositioned Side: Back          -  HOB Elevation: 30          -  Oral Hygiene Q4: Yes          -  Foley care per protocol: Yes      07/10/2020 17:59              -  Spontaneous Rate: 22          -  Spont TV: 680      07/10/2020 09:38              -  Call Alvester Morin Within Reach: Yes          -  High Fall Risk Interventions: Yes          I have reviewed and agree with vital signs as listed in the EMR Yes Time  07/11/2020 06:52.    **Labs**    All current labs have been reviewed by myself as of 07/11/2020 06:53.    **Labs**    All current labs have been reviewed by myself as of 07/11/2020 06:53.    **Radiology**    All current radiographs have been reviewed by myself as of 07/11/2020 06:53.    **Radiology**    All current radiographs have been reviewed by myself as of  07/11/2020 06:53.    **General**    Comment General: On nasal cannula, no visible distress, alert / interactive,  oriented to self only, profoundly delirious    CV: RRR with no m/r/g    Pulm: Clear lungs bilaterally, no audible rhonchi / wheezing / rales    GI: soft, NT/NT    Extremities: No pitting edema in lower extremity edema, wwp, more wrinkles  than before    Skin: no rashes.    **General**    Comment General: On nasal cannula, no visible distress, alert / interactive,  oriented to self only, profoundly delirious    CV: RRR with no m/r/g    Pulm: Clear lungs bilaterally, no audible rhonchi / wheezing / rales    GI: soft, NT/NT    Extremities: No pitting edema in lower extremity edema, wwp, more wrinkles  than before    Skin: no rashes.    **Assessment and Plan**    Medication              -   **CARBAMAZEPINE (TEGRETOL)** 600 MG Orogastric Tube QPM for 60 Days   Pending Date: 07/11/2020          -   **CARBAMAZEPINE (TEGRETOL)** 400 MG Orogastric Tube QAM for 60 Days   Pending Date: 07/11/2020          -   **DEXMEDETOMIDINE 400 MCG/100 ML (PRECEDEX PREMIX)** CONTINUOUS Intravenous INFUSION for 60 Days, Clinician ATF:[id number] TO RIKER SAS 3-4 START AT 0.2 MCG/KG/HR. MAY INCREASE BY 0.1 MCG/KG/HR Q15MIN TO A MAX OF 1.5 MCG/KG/HR.           -   **HYDRALAZINE (APRESOLINE)** 10 MG Intravenous Q6HPRN SBP > 180 mmHg for 60 Days, Clinician Dir:IV PUSH FOR ADULTS PER IV GUIDELINE           -   **HYDRALAZINE (APRESOLINE)** 50 MG Oral TID First Dose Now for 60 Days           -   ** ** ** **INSULIN REGULAR HUMAN (HUMULIN R)******** Sliding Scale Subcutaneous Q6H for 60 Days      For Glucose 80 to 150 Give 0 Units  For Glucose 151 to 200 Give 2 Units    For Glucose 201 to 250 Give 3 Units    For Glucose 251 to 300 Give 4 Units    Notify Physician if: Call HO for glucose > 300          -   **METHYLPREDNISOLONE (SOLU-MEDROL)** 60 MG Intravenous Q6H First Dose Now for 60 Days, Clinician Dir:IV PUSH OVER 3 -5  MINUTES           -   **BISACODYL (DULCOLAX)** 10 MG Rectal QDAYPRN Constipation for 60 Days, Clinician Dir:NOT RELIEVED BY PO           -   **MEROPENEM (MERREM)** 500 MG Intravenous Q12H First Dose Now for 60 Days, Clinician VHQ:IONGEX TO 100 ML NS MED PLUS. INFUSE FIRST DOSE OVER 30 MINUTES AND ALL SUBSEQUENT DOSES OVER 3 HOURS.           -   **SODIUM CL 0.9% FOR INH (STOCK) (SODIUM CHLORIDE NEB(STOCK))** 3 ML Inhalation Q4H for 60 Days           -   **SENNA (SENOKOT)** 17.2 MG Oral QDAY for 60 Days           -   **ACETAMINOPHEN (TYLENOL)** 650 MG Oral Q6HPRN Pain, Fever, Clinician Dir:NOT TO EXCEED 4 G APAP PER DAY           -   **PANTOPRAZOLE (PROTONIX)** 40 MG Intravenous QDAY First Dose Now for 60 Days           -   **TAMSULOSIN (FLOMAX)** 0.4 MG Oral QHS for 60 Days           -   **ATORVASTATIN (LIPITOR)** 80 MG Oral QHS for 60 Days           -   **DORZOLAMIDE 2% OPHTH (TRUSOPT 2% OPHTH)** 1 DROP Both Eyes SOL BID for 60 Days           -   **BRIMONIDINE 0.2% OPHTH (ALPHAGAN 0.2% OPHTH)** 1 DROP Both Eyes SOL TID for 60 Days           -   **DOCUSATE SODIUM (COLACE)** 100 MG Oral BID for 60 Days           -   **ASPIRIN CHEWABLE** 81 MG Oral QDAY for 60 Days           -   **IPRATROPIUM/ALBUTEROL SULFATE (DUONEB)** 3 ML Inhalation Q6H for 60 Days           -   **GUAIFENESIN SYRUP** 200 MG Oral Q4H First Dose Now for 60 Days           -   **LACTULOSE (CEPHULAC)** 10 G Oral QHSPRN Constipation for 60 Days           -   **HEPARIN** 5000 UNITS Subcutaneous Q8H First Dose Now for 60 Days           -   **BISACODYL E.C. (BISACODYL)** 10 MG Oral QDAYPRN Constipation for 60 Days           -   **BUDESONIDE INHALATION (PULMICORT RESPULES)** 1 MG Inhalation BID First Dose Now for 60 Days           Comment 55 yo male with history of HFrEF (BiV dysfunction, EF 20-25% 05/2019),  COPD not on home O2, pulmonary nodules, HTN, DM, and Schizoaffective disorder  who presented for shortness of  breath, found to be febrile and to have hypoxic  and hypercapnic  respiratory failure concerning for primary pneumonia,  component of decompensated heart failure / COPD exacerbation.        --NEURO--    #Toxic Metabolic Encephelopathy    #Schizoaffective disorder    Reportedly baseline memory issues / confusion. Concern during course for  gradually worsening mental status in setting of increasing hypercarbia,  managed w/ BiPAP etc per below. Also d/t concern for aspiration made NPO and  several home maintenance meds on hold likely contributing. Now remains  delirious despite vastly improved respiratory status. Intermittent agitation  requiring Precedex, was resumed overnight 3/2 - 3/3. Overal suspect primary  driver of ICU delirium.        -Titrate Precedex, goal off today with resumption of home meds; consider Clonidine if another agent needed     -Resume home Carbamazepine 400mg  qAM / 600mg  qHS    -Resume home Mirtazapine 15mg  qHS    -Resume home Zyprexa 10mg  qHS and will give one time dose now     -Resume home Seroquel 300mg  qHS         --PULM/ID--    #Acute hypoxic and hypercapneic respiratory failure    #Pneumonia    #Acute exacerbation of COPD (not on home O2)    H/o COPD (last PFTS (07/21/19) FEV1 24% with FEV1/FVC 40% GOLD IV), prior  admission 01/2020 for pseudomonas pneumonia. Presented with 3 days of SOB,  fevers, and difficulty walking due / DOE. Found febrile, with leukocytosis,  and CT revealing consolidative opacity with surrounding patchy groundglass  opacities involving the entire left lower lobe concerning for pneumonia.  Likely overall acute hypoxic respiratory failure multifactorial in nature,  primary pneumonia (WBC, fever, imaging) with component of CHF exacerbation  (edematous, wt up) / COPD component (wheezing on exam, mild respiratory  acidosis). Admitted to MICU d/t high flow requirement. Started on Cefepime  (prior PSA) and Azithromycin 2/24 - 2/27). Diuresed w/ initial poor response.  Also  started on Prednisone for COPD component (2/24 - 2/26). Urine legionella  / strep negative. Initial improvement, then with periods of worsening  hypercapnia and somnolence. Managed with up-titration of BiPAP settings and  increased steroid dose. Tolerating 6LNC this morning, ABG overnight  significantly improved 7.4/52/84/32.    ---Micro:    -MRSA swab negative    -BCx 2/25 NGTD    -Urine strep / legionella negative     -Sputum unable to produce        -Titrate supplemental oxygen as tolerated for goal SpO2 88-92%; goal stay off NIPPV if able, if rescue needed had been up to 22/7    -Continue Solumedrol 60mg  (started 3/2), wean q6h --> q12h today     -Continue Meropenem for planned 7 day course to complete today 3/3 (had been transitioned from Cefepime d/t mental status)    -Continue standing Duonebs, Budesonide, mucolytics    -Chest PT q4hr and Saline nebs q4hr        --CV--    #Acute on chronic systolic heart failure (LVEF 20-25% 01/2020)    #BiV dysfunction (Severely Reduced RV function)    #Type II NSTEMI 2/2 Demand Ischemia    #HLD    Known h/o systolic HF - last TTE (01/2020) w/ LVEF 20-25%, global LV  hypokinesis, RV dilation with severely reduced systolic function, 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 Initial picture of likely volume overload leading to  acute hypoxic respiratory failure. EKG without ischemic findings, trop peaked  0.12 and down-trended. BNP 737 (down from 1085 01/2020 admit), weight up 84kg  (dry wt 75kg). Previous admits for CHF, unclear follow up (planned to f/u w/  Renne Crigler). At that time discharged on Toprol, last filled Lopressor 25mg   BID 04/2020, unclear current BB regimen ongoing vs d/c'd. Previously on  Aldactone, d/c'd d/t hyperkalemia. Planned to consider ACE-I / SGLT-2  outpatient. Diuresed in ED w/ IV Lasix 120mg  / 200mg  with poor reported  output, and subsequent hypotension. Received Albumin x1 with  some improvement.  TTE 2/25 noted known findings of BiV dysfunction (EF 20-25%), RV  moderately/severely dilated and severely reduced RV, RA dilation, elevated RA  pressure, RV overload mild MR, severe TR, trace AR, moderate PR. Aggressively  diuresed with Lasix infusion, good volume output, improved respiratory status  and edema. Have since held and OUP remains brisk without intervention.        -Hold on further diuresis, UOP adequate without intervention; goal even to net negative    -Continue home Hydralazine 50mg  TID     -Would benefit from RHC +/- this admit to further evaluate ? PAH    -Consider inpatient HF consult for GDMT initation and optimization as patient did not follow up outpatient    -Continue home ASA and Atorvastatin         --RENAL--    #AKI    #Metabolic Alkaolsis    #Hyperkalemia - Resolved    #Hypernatremia    Worsening kidney function from baseline Cr 1.4. Peak Cr of 4.14 likely  secondary to cardiorenal syndrome. Cr slowly improving. Aggressively diuresed  (>9L negative) with Lasix / Diuril with improvement in respiratory status /  edema, also development of likely contraction metabolic alkalosis.  Intermittent Diamox dosing. Now with development of worsening hypernatremia.  Brisk UOP despite holding further diuresis likely represents post-ATN  autodiuresis.        -Hold on further diuresis today, UOP adequate without intervention; goal even to net negative    -Continue FWF 400 q4h    -Monitor daily BMP, Mg, Phos; replete electrolytes PRN        --GI--    #Nutrition    #Constipation    Mental status worsening in setting of hypercarbia / delirium, made NPO after  SLP eval. Small-bore NGT placed 3/2, confirmed on imaging.        -NGT in place    -Nutrition consult, start TF per team recs    -SLP team to follow for readiness    -Continue Colace; PRN Dulcolax if needed        --ENDO--    #T2DM    Last A1C 7.5 (01/2020)        -Holding home Metformin    -SSI q6h        --HEME--    H/H stable, no  active issues.        -Daily CBC        Full code (confirmed)    NPO (Plan for DHT placement on 3/2)    SQH, PPI    Daughter: Hulda Humphrey: [phone]    Social: Pt lives in a group home.    **Assessment and Plan**    Medication              -   **CARBAMAZEPINE (TEGRETOL)** 600 MG Orogastric Tube QPM for 60 Days   Pending Date: 07/11/2020          -   **CARBAMAZEPINE (TEGRETOL)** 400 MG Orogastric Tube QAM for 60 Days  Pending Date: 07/11/2020          -   **DEXMEDETOMIDINE 400 MCG/100 ML (PRECEDEX PREMIX)** CONTINUOUS Intravenous INFUSION for 60 Days, Clinician VHQ:IONGEXB TO RIKER SAS 3-4 START AT 0.2 MCG/KG/HR. MAY INCREASE BY 0.1 MCG/KG/HR Q15MIN TO A MAX OF 1.5 MCG/KG/HR.           -   **HYDRALAZINE (APRESOLINE)** 10 MG Intravenous Q6HPRN SBP > 180 mmHg for 60 Days, Clinician Dir:IV PUSH FOR ADULTS PER IV GUIDELINE           -   **HYDRALAZINE (APRESOLINE)** 50 MG Oral TID First Dose Now for 60 Days           -   ** ** ** **INSULIN REGULAR HUMAN (HUMULIN R)******** Sliding Scale Subcutaneous Q6H for 60 Days      For Glucose 80 to 150 Give 0 Units    For Glucose 151 to 200 Give 2 Units    For Glucose 201 to 250 Give 3 Units    For Glucose 251 to 300 Give 4 Units    Notify Physician if: Call HO for glucose > 300          -   **METHYLPREDNISOLONE (SOLU-MEDROL)** 60 MG Intravenous Q6H First Dose Now for 60 Days, Clinician Dir:IV PUSH OVER 3 -5 MINUTES           -   **BISACODYL (DULCOLAX)** 10 MG Rectal QDAYPRN Constipation for 60 Days, Clinician Dir:NOT RELIEVED BY PO           -   **MEROPENEM (MERREM)** 500 MG Intravenous Q12H First Dose Now for 60 Days, Clinician MWU:[id number] TO 100 ML NS MED PLUS. INFUSE FIRST DOSE OVER 30 MINUTES AND ALL SUBSEQUENT DOSES OVER 3 HOURS.           -   **SODIUM CL 0.9% FOR INH (STOCK) (SODIUM CHLORIDE NEB(STOCK))** 3 ML Inhalation Q4H for 60 Days           -   **SENNA (SENOKOT)** 17.2 MG Oral QDAY for 60 Days           -   **ACETAMINOPHEN (TYLENOL)** 650 MG  Oral Q6HPRN Pain, Fever, Clinician Dir:NOT TO EXCEED 4 G APAP PER DAY           -   **PANTOPRAZOLE (PROTONIX)** 40 MG Intravenous QDAY First Dose Now for 60 Days           -   **TAMSULOSIN (FLOMAX)** 0.4 MG Oral QHS for 60 Days           -   **ATORVASTATIN (LIPITOR)** 80 MG Oral QHS for 60 Days           -   **DORZOLAMIDE 2% OPHTH (TRUSOPT 2% OPHTH)** 1 DROP Both Eyes SOL BID for 60 Days           -   **BRIMONIDINE 0.2% OPHTH (ALPHAGAN 0.2% OPHTH)** 1 DROP Both Eyes SOL TID for 60 Days           -   **DOCUSATE SODIUM (COLACE)** 100 MG Oral BID for 60 Days           -   **ASPIRIN CHEWABLE** 81 MG Oral QDAY for 60 Days           -   **IPRATROPIUM/ALBUTEROL SULFATE (DUONEB)** 3 ML Inhalation Q6H for 60 Days           -   **GUAIFENESIN SYRUP** 200 MG Oral Q4H First Dose Now for 60 Days           -   **  LACTULOSE (CEPHULAC)** 10 G Oral QHSPRN Constipation for 60 Days           -   **HEPARIN** 5000 UNITS Subcutaneous Q8H First Dose Now for 60 Days           -   **BISACODYL E.C. (BISACODYL)** 10 MG Oral QDAYPRN Constipation for 60 Days           -   **BUDESONIDE INHALATION (PULMICORT RESPULES)** 1 MG Inhalation BID First Dose Now for 60 Days           Comment 55 yo male with history of HFrEF (BiV dysfunction, EF 20-25% 05/2019),  COPD not on home O2, pulmonary nodules, HTN, DM, and Schizoaffective disorder  who presented for shortness of breath, found to be febrile and to have hypoxic  and hypercapnic respiratory failure concerning for primary pneumonia,  component of decompensated heart failure / COPD exacerbation.        --NEURO--    #Toxic Metabolic Encephelopathy    #Schizoaffective disorder    Reportedly baseline memory issues / confusion. Concern during course for  gradually worsening mental status in setting of increasing hypercarbia,  managed w/ BiPAP etc per below. Also d/t concern for aspiration made NPO and  several home maintenance meds on hold likely contributing. Now remains  delirious  despite vastly improved respiratory status. Intermittent agitation  requiring Precedex, was resumed overnight 3/2 - 3/3. Overal suspect primary  driver of ICU delirium.        -Titrate Precedex, goal off today with resumption of home meds; consider Clonidine if another agent needed     -Resume home Carbamazepine 400mg  qAM / 600mg  qHS    -Resume home Mirtazapine 15mg  qHS    -Resume home Zyprexa 10mg  qHS and will give one time dose now     -Resume home Seroquel 300mg  qHS         --PULM/ID--    #Acute hypoxic and hypercapneic respiratory failure    #Pneumonia    #Acute exacerbation of COPD (not on home O2)    H/o COPD (last PFTS (07/21/19) FEV1 24% with FEV1/FVC 40% GOLD IV), prior  admission 01/2020 for pseudomonas pneumonia. Presented with 3 days of SOB,  fevers, and difficulty walking due / DOE. Found febrile, with leukocytosis,  and CT revealing consolidative opacity with surrounding patchy groundglass  opacities involving the entire left lower lobe concerning for pneumonia.  Likely overall acute hypoxic respiratory failure multifactorial in nature,  primary pneumonia (WBC, fever, imaging) with component of CHF exacerbation  (edematous, wt up) / COPD component (wheezing on exam, mild respiratory  acidosis). Admitted to MICU d/t high flow requirement. Started on Cefepime  (prior PSA) and Azithromycin 2/24 - 2/27). Diuresed w/ initial poor response.  Also started on Prednisone for COPD component (2/24 - 2/26). Urine legionella  / strep negative. Initial improvement, then with periods of worsening  hypercapnia and somnolence. Managed with up-titration of BiPAP settings and  increased steroid dose. Tolerating 6LNC this morning, ABG overnight  significantly improved 7.4/52/84/32.    ---Micro:    -MRSA swab negative    -BCx 2/25 NGTD    -Urine strep / legionella negative     -Sputum unable to produce        -Titrate supplemental oxygen as tolerated for goal SpO2 88-92%; goal stay off NIPPV if able, if rescue needed had  been up to 22/7    -Continue Solumedrol 60mg  (started 3/2), wean q6h --> q12h today     -Continue Meropenem for  planned 7 day course to complete today 3/3 (had been transitioned from Cefepime d/t mental status)    -Continue standing Duonebs, Budesonide, mucolytics    -Chest PT q4hr and Saline nebs q4hr        --CV--    #Acute on chronic systolic heart failure (LVEF 20-25% 01/2020)    #BiV dysfunction (Severely Reduced RV function)    #Type II NSTEMI 2/2 Demand Ischemia    #HLD    Known h/o systolic HF - last TTE (01/2020) w/ LVEF 20-25%, global LV  hypokinesis, RV dilation with severely reduced systolic function, 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 Initial picture of likely volume overload leading to  acute hypoxic respiratory failure. EKG without ischemic findings, trop peaked  0.12 and down-trended. BNP 737 (down from 1085 01/2020 admit), weight up 84kg  (dry wt 75kg). Previous admits for CHF, unclear follow up (planned to f/u w/  Renne Crigler). At that time discharged on Toprol, last filled Lopressor 25mg   BID 04/2020, unclear current BB regimen ongoing vs d/c'd. Previously on  Aldactone, d/c'd d/t hyperkalemia. Planned to consider ACE-I / SGLT-2  outpatient. Diuresed in ED w/ IV Lasix 120mg  / 200mg  with poor reported  output, and subsequent hypotension. Received Albumin x1 with some improvement.  TTE 2/25 noted known findings of BiV dysfunction (EF 20-25%), RV  moderately/severely dilated and severely reduced RV, RA dilation, elevated RA  pressure, RV overload mild MR, severe TR, trace AR, moderate PR. Aggressively  diuresed with Lasix infusion, good volume output, improved respiratory status  and edema. Have since held and OUP remains brisk without intervention.        -Hold on further diuresis, UOP adequate without intervention; goal even to net negative    -Continue home Hydralazine 50mg  TID     -Would benefit from RHC +/- this admit  to further evaluate ? PAH    -Consider inpatient HF consult for GDMT initation and optimization as patient did not follow up outpatient    -Continue home ASA and Atorvastatin         --RENAL--    #AKI    #Metabolic Alkaolsis    #Hyperkalemia - Resolved    #Hypernatremia    Worsening kidney function from baseline Cr 1.4. Peak Cr of 4.14 likely  secondary to cardiorenal syndrome. Cr slowly improving. Aggressively diuresed  (>9L negative) with Lasix / Diuril with improvement in respiratory status /  edema, also development of likely contraction metabolic alkalosis.  Intermittent Diamox dosing. Now with development of worsening hypernatremia.  Brisk UOP despite holding further diuresis likely represents post-ATN  autodiuresis.        -Hold on further diuresis today, UOP adequate without intervention; goal even to net negative    -Continue FWF 400 q4h    -Monitor daily BMP, Mg, Phos; replete electrolytes PRN        --GI--    #Nutrition    #Constipation    Mental status worsening in setting of hypercarbia / delirium, made NPO after  SLP eval. Small-bore NGT placed 3/2, confirmed on imaging.        -NGT in place    -Nutrition consult, start TF per team recs    -SLP team to follow for readiness    -Continue Colace; PRN Dulcolax if needed        --ENDO--    #T2DM    Last A1C 7.5 (01/2020)        -  Holding home Metformin    -SSI q6h        --HEME--    H/H stable, no active issues.        -Daily CBC        Full code (confirmed)    NPO (Plan for DHT placement on 3/2)    SQH, PPI    Daughter: Hulda Humphrey: [phone]    Social: Pt lives in a group home.            **Electronically signed by Rojelio Brenner, PA on 07/11/2020 10:39**        **Electronically signed by Rojelio Brenner, PA on 07/11/2020 10:39**

## 2020-07-04 NOTE — Procedures (Signed)
 Date of Procedure: 07/10/2020.    Performed By: Renea Ee .    Indication: frequent blood gases, BP monitoring .    Consent: The risks, benefits and alternatives were discussed and consent was  obtained from the patient/guardian prior to the procedure and placed in the  chart.    Medications Administered: Lidocaine .    Description of Procedure:    # Procedure Note: _Radial_ Arterial Line Placement    Indication: Blood pressure monitoring    Physicians: Renea Ee MD, supervised by Chancy Hurter MD        Description: Time Out performed. Consent in chart.        Patient's **Right** wrist was prepped and draped in usual sterile fashion.  **Radial** artery pulse was palpated. A 20g Arrow arterial line was introduced  into the **radial** artery. The **Right** **radial** artery was accessed on  the first attempt with a good flash of bright red blood.  The wire was  threaded easily and the catheter advanced smoothly. After removal of  guidewire, good blood flow from catheter lumen was observed. Catheter was  secured with sutures.  Sterile dressing applied.        Blood loss was minimal. Patient tolerated the procedure, and there were no  complications.        Renea Ee MD                **Electronically signed by Renea Ee, MD on 07/10/2020 13:17**

## 2020-07-04 NOTE — Progress Notes (Signed)
 Supervisory Note For Fellow I performed a history and physical examination of  the patient and discussed the management with the fellows. I reviewed the  fellow's note and agree with the documented findings and plan of care. Yes.            **Electronically signed by Margret Chance, MD on 07/13/2020 19:30**

## 2020-07-04 NOTE — Progress Notes (Signed)
 **Subjective**    Subjective MICU --> PULM TRANSFER        1M w/ pmhx significant for HFrEF (BiV dysfunction with EF 20-25% and severely  reduced RV function 05/2019), COPD (not on home O2 and current tobacco use),  pulmonary nodules, HTN, Type II DM and Schizoaffective disorder, who presented  with shortness of breath and fevers 2/24 and was found to have hypoxic and  hypercapnic respiratory failure concerning for pneumonia, decompensated heart  failure, and COPD exacerbation.        Patient presented with three days of fevers and dyspnea (at rest and exertion)  and was found to be hypoxic requiring hi flow nasal cannula and 10 pounds  above his dry weight.    He has a history of COPD (last PFTS (07/21/19) FEV1 24% with FEV1/FVC 40% GOLD  IV), prior admission 01/2020 for pseudomonas pneumonia. He was admitted to the  MICU for increasing high flow requirements (baseline on room air). His work-up  revealed patchy groundglass opacities involving the left lower lobe on Chest  CT concerning for pneumonia with negative urine strep and legionella and  negative MRSA nasal swab. He was treated with a seven day course of  antibiotics (cefepime 2/24-2/26, azithromycin 2/24-2/27, meropenem 2/27-3/3).  He was also given two days of prednisone 60mg  with concerns for COPD  exacerbation (discontinued prematurely with improving exam and worsening  mental status and volume overload). He initially improved however developed  periods of worsening hypercapnia and somnolence that were managed with BIPAP  and increased driving pressures and restarting IV steroids. He did not require  intubation and his mental status improved. He was weaned to 2L.        BNP was 737 on admission. Echocardiogram 07/05/20 showed moderately dilated  left ventricle with normal LV wall thickness and severely reduced systolic  function (EF 20-25%), severe global hypokinesis, moderate to severely dilated  RV with severely reduced systolic function, flattened  septum consistent with  RV pressure/volume overload and severe tricuspid regurgitation.    On presentation, he appeared to be volume overloaded likely contributing to  his acute hypoxic respiratory failure.    EKG without ischemic findings, trop peaked 0.12 and down-trended. BNP 737  (down from 1085 01/2020 admit), weight up 84kg (dry wt 75kg). He had previous  admissions for CHF, unclear/poor follow up (planned to f/u w/ Renne Crigler).    Patient was eventually placed on a Lasix gtt and also had post ATN diuresis  with net negative more than 8L. GDMT initiated 3/3 with metoprolol and  captoppril.        Patient also developed worsening kidney function during his hospitalization  with peak creatinine of 4.14 from his baseline of 1.4. Urinalysis showed 3+  protein (191 spot protein, 123 spot albumin) and thought his CKD is probably  related to DMII and HTN. Nephrology was consulted during his admission for  oliguria in the setting of his AKI and his acute kidney dysfunction was  thought to be pre-renal in the setting of cardiorenal and he developed post  ATN diuresis. He was given Diamox X1 for alkalosis and bicarbonate improved.                        On interview, patient is well-appearing. He reports no complaints. He denies  dizziness, lightheadedness, headache, fevers, chills, shortness of breath,  cough, palpitations, abdominal pain, leg swelling. He ambulated with PT prior  to examination and looked comfortable walking down  the hallway with a walker.  He is not sure when his last bowel movement was.    **Exam**    Comment Vitals per Soarian.        Gen: in NAD, comfortable in chair at bedside    HEENT: DHT in place, nasal cannula    Cardio: RRR, no MRGs    Lungs: normal WOB, bilateral crackles at the bases L>R    Abd: ND, NT    Ext: WWP, no pitting edema    Skin: darkening of bilateral shins ?venous stasis.      Vital Signs    07/12/2020 14:27              -  Heart Rate: 88 (60-90)          -  BP: 119/75  (90-140/60-90)      07/12/2020 09:57              -  O2 Amount: 2.0 LPM      07/12/2020 08:43              -  Morse Fall Risk Total: 45      07/12/2020 03:39              -  Weight: 64.3kg      07/11/2020 11:01              -  How Obtained: Bed Scale      CFS Vital Signs CS    07/12/2020 14:59              -  Shift Intake Total:          -  Shift Output Total:          -  Shift Balance:      07/12/2020 14:00              -  Pain: 0          -  Riker: 4 - Calm, awakens easily,follows commands          -  Heart Rate: 88          -  Cardiac Rhythm: NSR - Normal Sinus Rhythm          -  BP Systolic: 119          -  BP Diastolic: 75          -  MAP: 87          -  BP Site: Left Arm          -  BP Position: Lying          -  BP Source: NIBP          -  Respirations: 20          -  SPO2 %: 97 (89-100)          -  O2 Amount: 2.0 LPM          -  Mode: Nasal Cannula          -  Call Bell Within Reach: Yes          -  High Fall Risk Interventions: Yes          -  HOB Elevation: 45          -  Enteral Feeding: Nutren 1.0          -  Enteral Feeding Rate: 70cc/hr          -  IV Infusion 1: Normal Saline (KVO/Meds)          -  IV Dose 1: 5 ml/hr          -  ml/hour 1: 5 ml/hr      07/12/2020 13:00              -  BP Systolic-2: 159          -  BP Diastolic-2: 72          -  MAP-2: 95          -  Site-2: Right Arm          -  Position-2: Sitting          -  Source-2: Arterial      07/12/2020 12:00              -  Temp Calc: 37.00          -  Temp Site: Oral      07/12/2020 10:00              -  IV Infusion 2: Dexmedetomidine 455mcg/100ml          -  IV Dose 2: off          -  ml/hour 2: off      07/12/2020 09:00              -  Posterior Tibial: Right - Palpable,Left - Palpable          -  Dorsalis Pedis: Right - Palpable,Left - Palpable          -  Radial: Right - Palpable,Left - Palpable      07/12/2020 08:00              -   Repositioned Side: Back      07/12/2020 04:00              -  Oral Hygiene Q4: Yes      07/11/2020 22:00              -  Comment: PRN hydral given for BP >180      07/11/2020 19:00              -  Foley care per protocol: Yes      07/11/2020 14:22              -  Comment: TF started, advance by 20 q6. Goal 70      07/11/2020 08:00              -  Medication Name 1: Acetazolamide       07/11/2020 06:00              -  FiO2: 35          -  Set Rate: 14          -  TV: 470          -  PS: 10          -  Peep: 5          I have reviewed and agree with vital signs as listed in the EMR Yes Time  07/12/2020 14:35.    **Assessment and Plan**    Medication              -   **POLYETHYLENE GLYCOL (MIRALAX)** 17 G Oral BID for 60 Days   Pending Date: 07/12/2020          -   **  METOPROLOL (LOPRESSOR)** 6.25 MG Oral QID for 60 Days           -   **HYDRALAZINE (APRESOLINE)** 50 MG Oral TID for 59 Days           -   **CAPTOPRIL (CAPOTEN)** 6.25 MG Oral TID First Dose Now for 60 Days           -   **METHYLPREDNISOLONE (SOLU-MEDROL)** 60 MG Intravenous QDAY for 59 Days, Clinician Dir:IV PUSH OVER 3 -5 MINUTES           -   **QUETIAPINE FUMARATE (SEROQUEL)** 300 MG Oral QHS for 60 Days           -   **OLANZAPINE (ZYPREXA)** 10 MG Oral QHS for 60 Days           -   **MIRTAZAPINE (REMERON)** 15 MG Oral QHS for 60 Days           -   **CARBAMAZEPINE (TEGRETOL)** 600 MG Orogastric Tube QPM for 60 Days           -   **GUAIFENESIN SYRUP** 200 MG Oral Q4H First Dose Now           -   **DOCUSATE SODIUM** 100 MG Nasogastric BID for 60 Days           -   **CARBAMAZEPINE (TEGRETOL)** 400 MG Orogastric Tube QAM for 60 Days           -   **HYDRALAZINE (APRESOLINE)** 10 MG Intravenous Q6HPRN SBP > 180 mmHg for 60 Days, Clinician Dir:IV PUSH FOR ADULTS PER IV GUIDELINE           -   ** ** ** **INSULIN REGULAR HUMAN (HUMULIN R)******** Sliding Scale Subcutaneous Q6H for 60 Days      For Glucose 80 to 150  Give 0 Units    For Glucose 151 to 200 Give 2 Units    For Glucose 201 to 250 Give 3 Units    For Glucose 251 to 300 Give 4 Units    Notify Physician if: Call HO for glucose > 300          -   **BISACODYL (DULCOLAX)** 10 MG Rectal QDAYPRN Constipation for 60 Days, Clinician Dir:NOT RELIEVED BY PO           -   **SODIUM CL 0.9% FOR INH (STOCK) (SODIUM CHLORIDE NEB(STOCK))** 3 ML Inhalation Q4H for 60 Days           -   **SENNA (SENOKOT)** 17.2 MG Oral QDAY for 60 Days           -   **ACETAMINOPHEN (TYLENOL)** 650 MG Oral Q6HPRN Pain, Fever, Clinician Dir:NOT TO EXCEED 4 G APAP PER DAY           -   **TAMSULOSIN (FLOMAX)** 0.4 MG Oral QHS for 60 Days           -   **ATORVASTATIN (LIPITOR)** 80 MG Oral QHS for 60 Days           -   **ASPIRIN CHEWABLE** 81 MG Oral QDAY for 60 Days           -   **BRIMONIDINE 0.2% OPHTH (ALPHAGAN 0.2% OPHTH)** 1 DROP Both Eyes SOL TID for 60 Days           -   **DORZOLAMIDE 2% OPHTH (TRUSOPT 2% OPHTH)** 1 DROP Both Eyes SOL BID for 60 Days           -   **  IPRATROPIUM/ALBUTEROL SULFATE (DUONEB)** 3 ML Inhalation Q6H for 60 Days           -   **LACTULOSE (CEPHULAC)** 10 G Oral QHSPRN Constipation for 60 Days           -   **HEPARIN** 5000 UNITS Subcutaneous Q8H First Dose Now for 60 Days           -   **BISACODYL E.C. (BISACODYL)** 10 MG Oral QDAYPRN Constipation for 60 Days           -   **BUDESONIDE INHALATION (PULMICORT RESPULES)** 1 MG Inhalation BID First Dose Now for 60 Days           Comment 25M w/ pmhx significant for HFrEF (BiV dysfunction with EF 20-25% and  severely reduced RV function 05/2019), COPD (not on home O2 and current tobacco  use), pulmonary nodules, HTN, Type II DM and Schizoaffective disorder, who  presented with shortness of breath and fevers 2/24 and was found to have  hypoxic and hypercapnic respiratory failure concerning for pneumonia,  decompensated heart failure, and COPD exacerbation. Transfered out of MICU  3/4.            #Acute  hypoxic and hypercapneic respiratory failure    #Pneumonia    #Acute exacerbation of COPD (not on home O2)    #Pulmonary Nodules    H/o COPD (last PFTS (07/21/19) FEV1 24% with FEV1/FVC 40% GOLD IV), prior  admission 01/2020 for Pseudomonas pneumonia. Presented with 3 days of SOB,  fevers, and difficulty walking 2/2 DOE. Found to be febrile, with  leukocytosis, and CT revealing consolidative opacity with surrounding patchy  groundglass opacities involving the entire left lower lobe concerning for pna.  Likely overall acute hypoxic respiratory failure multifactorial in nature,  primary pneumonia (WBC, fever, imaging) with component of CHF exacerbation  (edematous, wt gain) and COPD exacerbation (wheezing on exam, mild respiratory  acidosis). Admitted to MICU d/t high flow requirement. Started on cefepime  (prior PSA) and azithromycin 2/24 - 2/27) and then placed on Meropenam for 7d  course. Sputum unable to be produce for cx. Diuresed w/ initial poor response.  Also started on Prednisone for COPD component (2/24 - 2/26) and restarted IV  solumederol due to worsening status once prednisone stopped. Urine  legionella/strep negative. Initial improvement, then with periods of worsening  hypercapnia and somnolence. Managed with up-titration of BiPAP settings and  increased steroid dose. Weaned to NC 3/3.    - Titrate supplemental oxygen as tolerated for goal SpO2 88-92%; goal stay  off NIPPV if able, if rescue needed had been up to 22/7.    - Continue Solumedrol 60mg  (started 3/2- ), wean q6h --> q12h-->q24hr  -->likely can be changed to PO pred 3/5 and continue until 3/6.    - Continue standing Duonebs, Budesonide, mucolytics.    - Chest PT q4hr and Saline nebs q4hr.    - Follow-up chest CT in 6-8 weeks for pulmonary nodules.        #Acute on chronic systolic heart failure (LVEF 20-25% 01/2020)    #BiV dysfunction (Severely Reduced RV function)    #Type II NSTEMI 2/2 Demand Ischemia    #HLD    Known h/o systolic HF - last  TTE (01/2020) w/ LVEF 20-25%, global LV  hypokinesis, RV dilation with severely reduced systolic function, 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 Initial picture of likely volume overload leading to  acute hypoxic respiratory failure. EKG without ischemic findings, trop peaked  0.12 and down-trended. BNP 737 (down from 1085 01/2020 admit), weight up 84kg  (dry wt 75kg). Previous admits for CHF, unclear follow up (planned to f/u w/  Renne Crigler). At that time discharged on Toprol, last filled Lopressor 25mg   BID 04/2020, unclear current BB regimen ongoing vs d/c'd. Previously on  Aldactone, d/c'd d/t hyperkalemia. Planned to consider ACE-I/SGLT-2  outpatient.    TTE 2/25 noted known findings of BiV dysfunction (EF 20-25%), RV  moderately/severely dilated and severely reduced RV, RA dilation, elevated RA  pressure, RV overload mild MR, severe TR, trace AR, moderate PR. Aggressively  diuresed with Lasix infusion, good volume output, improved respiratory status  and edema. Have since held and UOP remains brisk without intervention.    - GDMT:    --- C/w metoprolol 6.25mg  q6h.    --- C/w captopril 6.25mg  tid.    - Hold on further diuresis, UOP adequate without intervention; goal even to  net negative; consider starting PO lasix on 3/5.    - Hydralazine 50 mg TID.    - Would benefit from RHC +/- this admit to further evaluate ? PAH.    - Consider inpatient HF consult for GDMT optimization as patient did not  follow up outpatient.    - Continue home ASA and Atorvastatin.        #AKI    #Metabolic Alkaolsis    #Hyperkalemia - Resolved    #Hypernatremia    Worsening kidney function from baseline Cr 1.4. Peak Cr of 4.14 likely  secondary to cardiorenal syndrome. Cr slowly improving. Aggressively diuresed  (>9L negative) with Lasix / Diuril with improvement in respiratory status /  edema, also development of likely contraction metabolic  alkalosis.  Intermittent Diamox dosing. Now with development of worsening hypernatremia.  Brisk UOP despite holding further diuresis likely represents post-ATN  autodiuresis.    - Hold on further diuresis today, goal even to net negative; diuresis per  above.    -Cont FWF 600 Q4h. If worsening will need to consider D5W.        #Toxic Metabolic Encephelopathy - Improving    #Schizoaffective disorder    Reportedly baseline memory issues/confusion. Concern during course for  gradually worsening mental status in setting of increasing hypercarbia,  managed previously w/ BiPAP etc. Also d/t concern for aspiration made NPO and  several home maintenance meds were hold. Required Precedex in ICU. Now remains  intermittently delirious though improving. Likely some ICU delirium as well.    -Cont home Carbamazepine 400mg  qAM / 600mg  qHS.    -Cont home Mirtazapine 15mg  qHS.    -Cont home Zyprexa 10mg  qHS.     -Cont home Seroquel 300mg  qHS.        #Nutrition    #Constipation    Mental status worsening in setting of hypercarbia / delirium, made NPO after  SLP eval. Small-bore DHT placed 3/2.    - Nutrition consult, start TF per recs.    - SLP team to follow for readiness.    - Continue Colace; PRN Dulcolax if needed.        #T2DM    Last A1C 7.5 (01/2020)    - Holding home Metformin.    - SSI q6h.            FULL CODE    NPO + TF via DHT    SQH  PPI stopped    Daughter: Donnie Aho Keo: [phone]  Social: Pt lives in a group home.            **Electronically signed by Arnetha Gula, MD on 07/12/2020 17:20**

## 2020-07-04 NOTE — Progress Notes (Signed)
 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  Chest CT revealed: consolidative opacity with surrounding  patchy groundglass opacities involving the entire left lower lobe).  Hospitalization complicated by toxic-metabolic encephalopathy (ddx: sepsis vs.  uremia vs. missed psychiatric medications) and tenuous cardiopulmonary status.        The patient is being followed for ongoing assessment of the oropharyngeal  swallow mechanism to determine the risk of aspiration and potential to resume  a PO diet. Previous evaluations have continued to yield an impairment  oropharyngeal swallow mechanism despite gradual improvements in mental status  and level of alertness warranting NPO status except for ice chips, sips of  water and essential medications with puree as tolerated with continuation of  alternate means of nutrition, hydration and medication.  Patient transferred  from [facility] to N7.  Upon entry, patient seated upright in bed breathing  comfortably on supplemental oxygen via NC.  Ongoing improvements noted in  patient's clinical presentation, including mental status/level of alertness.  PO trials consumed with patient engaging in self-feeding.        **Exam:**    _Current Status:_    Communicative: verbal, fluent; intelligible for basic wants/needs    Cognitive: able to follow simple commands to participate    Behavioral: awake/alert, pleasant and cooperative, engaging in self-feeding    Hearing: responsive to voice    Nutrition: NPO except ice chips, sips of water and essential medication  with  puree; TFs via DHT    Respiratory: comfortable on supplemental oxygen via NC, no observed distress        _Oral Motor Examination:_    Dentition: poor quality; reduced quantity    Head and neck: symmetrical orofacial structures; NC and DHT in place    Lingual function: protrusion midline    Labial function: symmetrical at rest and activation    Oral mucosa: dry appearing    Palate: not evaluated    Voice: adequate, clear sounding    Cough to command: not elicited        _Clinical Swallow Evaluation_ :    Swallowing was evaluated with the following consistencies:  ice chip, thin  liquids, puree, regular solids        ORAL PHASE:    Labial seal: adequate for utensil stripping and straw seal, no anterior loss  of bolus    Mastication: present, rotary/complete    Intraoral residue: none observed        PHARYNGEAL PHASE:    Pharyngeal initiation: present, appears timelier    Laryngeal elevation: adequate to palpation    Repeated Swallows: 1-2x/swallows per bolus    Post-prandial cough or throat clearing: none observed    Post-prandial vocal quality: clear, unchanged to phonation        **Impression** : The patient presents with significant improvements in  oropharyngeal swallow mechanism compared to previous encounters positively  impacted by overall improvements in clinical presentation evidenced by  tolerance of PO trials without observed difficulty, overt s/s of aspiration,  decline in respiratory status or  change in vocal quality given adequate pacing  and bolus size administration while engaging in self-feeding.  Given patient's  clinical presentation and no evidence of oropharyngeal dysphagia, there are no  contraindications to patient resuming a PO diet of regular solids and thin  liquids.  Maintain standard aspiration precautions.  Maintain a low threshold  to make NPO given overt s/s of aspiration or decline in mental/respiratory  status.  Continue alternate means of nutrition, hydration and medication  at  the discretion of primary team.  Will continue to follow for diet tolerance  and make additional recommendations as warranted.        **Recommendations** :    1\. Regular solids, thin liquids    2\. Medication orally as tolerated    3\. Aspiration precautions              -  Upright 90 degrees           -  Only offer when awake/alert and engaging           -  Ensure stable respiratory status prior to PO intake           -  Slow rate, small bites, single sips          -  Alternate solids and liquids          -  Provide meal set up and assistance as needed      4\. Continue alternate means of nutrition, hydration and medication at the  discretion of primary team    5\. Maintain a low threshold to make NPO given overt s/s of aspiration or  decline in mental/respiratory status    6\. Will follow 3-5x/week for diet tolerance        Case discussed with patient and NSG; primary team paged with recommendations.        Please page with questions or concerns.        Orlinda Blalock, MS, CCC-SLP    Department of Speech-Language Pathology    Pager: [phone] or [facility] Text                **Electronically signed by Orlinda Blalock, CCC-SLP on 07/15/2020 09:59**

## 2020-07-04 NOTE — Progress Notes (Signed)
 **Subjective**    Reason for Referral AKI.    Subjective Made 8L UOP in 24 hrs. .    **Exam**    Vital Signs    07/09/2020 01:40              -  O2 Amount: 40%      07/08/2020 22:45              -  Lattie Corns Fall Risk Total: 50      CFS Vital Signs CS    07/09/2020 08:00              -  Pain: 0          -  Riker: 3 - Arouses to verbal stimuli / gentle shaking          -  Temp Calc: 36.80          -  Temp Site: Oral          -  Heart Rate: 97          -  Cardiac Rhythm: NSR - Normal Sinus Rhythm          -  BP Systolic: 133          -  BP Diastolic: 56          -  MAP: 85          -  BP Site: Left Arm          -  BP Position: Lying          -  BP Source: NIBP          -  Respirations: 16          -  SPO2 %: 93 (89-100)          -  Mode: Hi Flow          -  FiO2: 40%, 50L          -  Posterior Tibial: Right - Palpable,Left - Palpable          -  Dorsalis Pedis: Right - Palpable,Left - Palpable          -  Radial: Right - Palpable,Left - Palpable          -  High Fall Risk Interventions: Yes          -  Repositioned Side: Back          -  HOB Elevation: 30          -  IV Infusion 1: Normal Saline (KVO/Meds)          -  IV Dose 1: 5 ml/hr          -  ml/hour 1: 5 ml/hr      07/09/2020 07:00              -  Call Bell Within Reach: Yes      07/09/2020 06:59              -  Shift Intake Total:          -  Shift Output Total:          -  Shift Balance: -      07/09/2020 06:10              -  Comment: P&V on bed while getting nebs          -  Medication Name 1:  Potassium Chloride          -  Medication Dose 1:          -  Medication Freq 1: One Time Only      07/09/2020 04:00              -  Foley care per protocol: Yes      07/09/2020 01:27              -  Respiratory Comments: pt acutely desatted, RN and RT in room, HFNC placed on 100% briefly, RT increased to 50L      07/08/2020 20:00              -  Oral Hygiene Q4:  Yes          -  IV Infusion 2: furosemide          -  IV Dose 2: on hold          -  ml/hour 2: on hold          -  IV Infusion 3: Dexmedetomidine 435mcg/100ml          -  IV Dose 3: on hold          -  ml/hour 3: on hold      07/08/2020 02:00              -  EtCO2: 94          I have reviewed and agree with vital signs as listed in the EMR Yes Time  07/09/2020 15:06.    Comment General: Awake in no acute distress with HFNC, following commands but  not orientating questions    CV: RRR with no m/r/g    Pulm: Rhonchi anteriorally b/l, no wheezes    GI: soft, NT/NT    Extremities: 1+ b/l lower extremity edema, wwp, more wrinkles than before    Skin: no rashes.    **Assessment and Plan**    Medication              -   **KCL PREMIX 10 MEQ IN 100 ML (POTASSIUM CHLORIDE 10 MEQ)** 10 MEQ Intravenous ONE TIME for 1 Doses, Clinician VOZ:DGUY 4   Pending Date: 07/09/2020          -   **KCL PREMIX 10 MEQ IN 100 ML (POTASSIUM CHLORIDE 10 MEQ)** 10 MEQ Intravenous ONE TIME for 1 Doses, Clinician QIH:KVQQ 3           -   **KCL PREMIX 10 MEQ IN 100 ML (POTASSIUM CHLORIDE 10 MEQ)** 10 MEQ Intravenous ONE TIME for 1 Doses, Clinician VZD:GLOV 2           -   **KCL PREMIX 10 MEQ IN 100 ML (POTASSIUM CHLORIDE 10 MEQ)** 10 MEQ Intravenous ONE TIME for 1 Doses, Clinician FIE:PPIR 1           -   **BISACODYL (DULCOLAX)** 10 MG Rectal QDAYPRN Constipation for 60 Days, Clinician Dir:NOT RELIEVED BY PO           -   **DEXMEDETOMIDINE 400 MCG/100 ML (PRECEDEX PREMIX)** CONTINUOUS Intravenous INFUSION for 60 Days, Clinician JJO:ACZYSAY TO RIKER SAS 3-4 START AT 0.2 MCG/KG/HR. MAY INCREASE BY 0.1 MCG/KG/HR Q15MIN TO A MAX OF 1.5 MCG/KG/HR.           -   **MEROPENEM (MERREM)** 500 MG Intravenous Q12H First Dose Now for 60 Days, Clinician TKZ:SWFUXN TO 100 ML NS MED  PLUS. INFUSE FIRST DOSE OVER 30 MINUTES AND ALL SUBSEQUENT DOSES OVER 3 HOURS.           -   **SODIUM CL 0.9% FOR INH (STOCK) (SODIUM CHLORIDE NEB(STOCK))**  3 ML Inhalation Q4H for 60 Days           -   **SENNA (SENOKOT)** 17.2 MG Oral QDAY for 60 Days           -   **POLYETHYLENE GLYCOL (MIRALAX)** 17 G Oral QDAY First Dose Now for 60 Days           -   **OLANZAPINE (ZYPREXA)** 10 MG Oral QHS STAT and then Routine for 60 Days, Clinician Dir:MAX DAILY DOSE: 20 MG/DAY           -   **ACETAMINOPHEN (TYLENOL)** 650 MG Oral Q6HPRN Pain, Fever, Clinician Dir:NOT TO EXCEED 4 G APAP PER DAY           -   **PANTOPRAZOLE (PROTONIX)** 40 MG Intravenous QDAY First Dose Now for 60 Days           -   **ATORVASTATIN (LIPITOR)** 80 MG Oral QHS for 60 Days           -   **TAMSULOSIN (FLOMAX)** 0.4 MG Oral QHS for 60 Days           -   **BRIMONIDINE 0.2% OPHTH (ALPHAGAN 0.2% OPHTH)** 1 DROP Both Eyes SOL TID for 60 Days           -   **DORZOLAMIDE 2% OPHTH (TRUSOPT 2% OPHTH)** 1 DROP Both Eyes SOL BID for 60 Days           -   **CARBAMAZEPINE (TEGRETOL)** 400 MG Oral QAM for 60 Days           -   **DOCUSATE SODIUM (COLACE)** 100 MG Oral BID for 60 Days           -   **ASPIRIN CHEWABLE** 81 MG Oral QDAY for 60 Days           -   ** ** ** **INSULIN LISPRO (HUMALOG) (HUMALOG)******** Sliding Scale Subcutaneous TIDWM for 60 Days      For Glucose 80 to 150 Give 0 Units    For Glucose 151 to 200 Give 2 Units    For Glucose 201 to 250 Give 3 Units    For Glucose 251 to 300 Give 4 Units    Notify Physician if: Call HO for glucose > 300          -   **IPRATROPIUM/ALBUTEROL SULFATE (DUONEB)** 3 ML Inhalation Q6H for 60 Days           -   **GUAIFENESIN SYRUP** 200 MG Oral Q4H First Dose Now for 60 Days           -   **MIRTAZAPINE (REMERON)** 15 MG Oral QHS First Dose Now for 60 Days           -   **QUETIAPINE FUMARATE (SEROQUEL)** 400 MG Oral QHS First Dose Now for 60 Days           -   **CARBAMAZEPINE (TEGRETOL)** 600 MG Oral QPM First Dose Now for 60 Days           -   **LACTULOSE (CEPHULAC)** 10 G Oral QHSPRN Constipation for 60 Days           -    **HEPARIN** 5000 UNITS Subcutaneous Q8H First Dose Now for  60 Days           -   **BISACODYL E.C. 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 Has had proteinuria since at least 2020 so  potentially diabetic kidney disease. AKI is likely pre-renal in the setting of  CRS.    - now with post ATN diuresis -        - he has a primary respiratory acidosis - address this first (lower SpO2  goal); can use diamox 250 BID if doesnt improve, need to replete K to ~4  before giving diamox        - noticed some pressure on his face from HFNC - wonder if theres a way to  protect his skin .            **Electronically signed by Dorris Fetch, MD on 07/09/2020 15:23**

## 2020-07-04 NOTE — Progress Notes (Signed)
 **Subjective**    Reason for Referral AKI.    Subjective Cr downtrending.    UOP 2L. .    **Exam**    Vital Signs    07/12/2020 08:43              -  O2 Amount: 2.0 LPM          -  Morse Fall Risk Total: 45      07/12/2020 03:39              -  Weight: 64.3kg      07/11/2020 11:01              -  How Obtained: Bed Scale      CFS Vital Signs CS    07/12/2020 14:59              -  Shift Intake Total: 90ml          -  Shift Output Total: 0ml          -  Shift Balance: 90ml      07/12/2020 09:00              -  Pain: 0          -  Riker: 4 - Calm, awakens easily,follows commands          -  Heart Rate: 91          -  Cardiac Rhythm: NSR - Normal Sinus Rhythm          -  BP Systolic: 165          -  BP Diastolic: 72          -  MAP: 97          -  BP Site: Right Arm          -  BP Position: Lying          -  BP Source: Arterial          -  Respirations: 25          -  SPO2 %: 97 (89-100)          -  O2 Amount: 2.0 LPM          -  Mode: Nasal Cannula          -  Posterior Tibial: Right - Palpable,Left - Palpable          -  Dorsalis Pedis: Right - Palpable,Left - Palpable          -  Radial: Right - Palpable,Left - Palpable          -  Call Bell Within Reach: Yes          -  High Fall Risk Interventions: Yes          -  HOB Elevation: 30          -  Enteral Feeding: Nutren 1.0          -  Enteral Feeding Rate: 70cc/hr          -  IV Infusion 1: Normal Saline (KVO/Meds)          -  IV Dose 1: 5 ml/hr          -  ml/hour 1: 5 ml/hr          -  IV Infusion 2: Dexmedetomidine 433mcg/100ml          -  IV Dose 2: off          -  ml/hour 2: off      07/12/2020 08:00              -  Temp Calc: 36.40          -  Temp Site: Oral          -  Repositioned Side: Back      07/12/2020 04:00              -  Oral Hygiene Q4: Yes      07/11/2020 22:00              -  Comment: PRN hydral given for BP >180      07/11/2020 19:00              -  Foley care per  protocol: Yes      07/11/2020 14:22              -  Comment: TF started, advance by 20 q6. Goal 70      07/11/2020 08:00              -  Medication Name 1: Acetazolamide       07/11/2020 06:00              -  FiO2: 35          -  Set Rate: 14          -  TV: 470          -  PS: 10          -  Peep: 5      07/11/2020 04:00              -  BP Systolic-2: 137          -  BP Diastolic-2: 52          -  MAP-2: 75          -  Site-2: Right Arm          -  Position-2: Lying          -  Source-2: Arterial          I have reviewed and agree with vital signs as listed in the EMR Yes Time  07/12/2020 15:38.    Comment General: On nasal cannula, no visible distress, alert / interactive    CV: RRR with no m/r/g    Pulm: Clear lungs bilaterally, no audible rhonchi / wheezing / rales    GI: soft, NT/NT    Extremities: No pitting edema in lower extremity edema    Skin: no rashes.    **Assessment and Plan**    Medication              -   **METOPROLOL (LOPRESSOR)** 6.25 MG Oral QID for 60 Days   Pending Date: 07/12/2020          -   **CAPTOPRIL (CAPOTEN)** 6.25 MG Oral TID First Dose Now for 60 Days           -   **METHYLPREDNISOLONE (SOLU-MEDROL)** 60 MG Intravenous QDAY for 59 Days, Clinician Dir:IV PUSH OVER 3 -5 MINUTES           -   **HYDRALAZINE (APRESOLINE)** 75 MG Oral TID for 59 Days           -   **OLANZAPINE (ZYPREXA)** 10 MG Oral  QHS for 60 Days           -   **MIRTAZAPINE (REMERON)** 15 MG Oral QHS for 60 Days           -   **QUETIAPINE FUMARATE (SEROQUEL)** 300 MG Oral QHS for 60 Days           -   **CARBAMAZEPINE (TEGRETOL)** 600 MG Orogastric Tube QPM for 60 Days           -   **GUAIFENESIN SYRUP** 200 MG Oral Q4H First Dose Now           -   **CARBAMAZEPINE (TEGRETOL)** 400 MG Orogastric Tube QAM for 60 Days           -   **DOCUSATE SODIUM** 100 MG Nasogastric BID for 60 Days           -   **HYDRALAZINE (APRESOLINE)** 10 MG Intravenous Q6HPRN SBP > 180 mmHg for 60 Days,  Clinician Dir:IV PUSH FOR ADULTS PER IV GUIDELINE           -   ** ** ** **INSULIN REGULAR HUMAN (HUMULIN R)******** Sliding Scale Subcutaneous Q6H for 60 Days      For Glucose 80 to 150 Give 0 Units    For Glucose 151 to 200 Give 2 Units    For Glucose 201 to 250 Give 3 Units    For Glucose 251 to 300 Give 4 Units    Notify Physician if: Call HO for glucose > 300          -   **BISACODYL (DULCOLAX)** 10 MG Rectal QDAYPRN Constipation for 60 Days, Clinician Dir:NOT RELIEVED BY PO           -   **SODIUM CL 0.9% FOR INH (STOCK) (SODIUM CHLORIDE NEB(STOCK))** 3 ML Inhalation Q4H for 60 Days           -   **SENNA (SENOKOT)** 17.2 MG Oral QDAY for 60 Days           -   **ACETAMINOPHEN (TYLENOL)** 650 MG Oral Q6HPRN Pain, Fever, Clinician Dir:NOT TO EXCEED 4 G APAP PER DAY           -   **ATORVASTATIN (LIPITOR)** 80 MG Oral QHS for 60 Days           -   **TAMSULOSIN (FLOMAX)** 0.4 MG Oral QHS for 60 Days           -   **DORZOLAMIDE 2% OPHTH (TRUSOPT 2% OPHTH)** 1 DROP Both Eyes SOL BID for 60 Days           -   **ASPIRIN CHEWABLE** 81 MG Oral QDAY for 60 Days           -   **BRIMONIDINE 0.2% OPHTH (ALPHAGAN 0.2% OPHTH)** 1 DROP Both Eyes SOL TID for 60 Days           -   **IPRATROPIUM/ALBUTEROL SULFATE (DUONEB)** 3 ML Inhalation Q6H for 60 Days           -   **LACTULOSE (CEPHULAC)** 10 G Oral QHSPRN Constipation for 60 Days           -   **HEPARIN** 5000 UNITS Subcutaneous Q8H First Dose Now for 60 Days           -   **BISACODYL E.C. 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    - now with post ATN diuresis -        - Cr downtrending , UOP remains high on its own. Diamox on for alkalosis, can  stop once bicarb <30    - avoid hypotension , contrast, NSAIDs        - started on ACEI, reconsider if Cr goes up again, can use imdur, doxazosin,  clonidine,        #) Hypernatremia - resolved    Continue FWF with TF .            **Electronically signed by Dorris Fetch, MD on 07/12/2020 15:41**

## 2020-07-04 NOTE — Progress Notes (Signed)
 **Note**    **_Speech Pathology_**    **Progress Note - Swallowing**        **HPI:** Arthur Young is a 55 year old male who lives in a group home with PMHx  of HFrEF (BiV dysfunction, EF 20-25% 05/2019), COPD not on home O2, pulmonary  nodules, HTN, DM, and schizoaffective disorder who presented for shortness of  breath, found to be febrile and to have hypoxic respiratory failure concerning  for primary pneumonia with component of decompensated heart failure / COPD  exacerbation.  Chest CT revealed: consolidative opacity with surrounding  patchy groundglass opacities involving Arthur entire left lower lobe).  Hospitalization complicated by toxic-metabolic encephalopathy (ddx: sepsis vs.  uremia vs. missed psychiatric medications) and tenuous cardiopulmonary status.        Arthur Young is being followed for ongoing assessment of Arthur oropharyngeal  swallow mechanism to determine Arthur risk of aspiration and potential to resume  a PO diet. Previous evaluations have continued to yield an impairment  oropharyngeal swallow mechanism negative impacted by altered mental status and  waxing/waning levels of alertness warranting NPO status except for ice chips,  sips of water and essential medications with puree as tolerated.  Of note, a  DHT was placed for alternate means of nutrition, hydration and medication by  primary team.  Per discussion with RN, Young has been tolerating PO  medication and apple sauce without observed difficulty or overt s/s of  aspiration.  Upon entry, Young seated upright in bed breathing comfortably  on supplemental oxygen via NC.  Overall improvements noted in Young's  clinical presentation, including mental status/level of alertness.  PO trials  consumed with Young engaging in self-feeding with guided assistance.        **Exam:**    _Current Status:_    Communicative: verbal, fluent; intelligible for basic wants/needs    Cognitive: able to follow simple commands to participate    Behavioral:  awake/alert, residual confusion, cooperative with evaluation,  engaging in self-feeding    Hearing: responsive to voice    Nutrition: NPO except ice chips, sips of water and essential medication with  puree; DHT placed    Respiratory: SpO2 94% 2L O2 NC, no observed distress        _Oral Motor Examination:_    Dentition: poor quality; reduced quantity    Head and neck: symmetrical orofacial structures; NC and DHT in place    Lingual function: protrusion midline    Labial function: symmetrical at rest and activation    Oral mucosa: dry appearing    Palate: not evaluated    Voice: low intensity, clear sounding    Cough to command: weak        _Clinical Swallow Evaluation_ :    Swallowing was evaluated with Arthur following consistencies:  ice chip, thin  liquids via tsp/cup, puree solids        ORAL PHASE:    Labial seal: adequate for utensil stripping and cup sip given direct  placement, no anterior loss of bolus    Mastication: present, brisk/complete with ice chips    Intraoral residue: suspicion for premature posterior spillage into Arthur  hypopharynx prior to Arthur initiation of pharyngeal trigger with thin liquids  via cup        PHARYNGEAL PHASE:    Pharyngeal initiation: present, subjectively delayed    Laryngeal elevation: reduced to palpation    Repeated Swallows: 1-2x/swallows per bolus    Post-prandial cough or throat clearing: immediate and delayed prolonged  coughing episodes  with thin liquids via cup sip, more pronounced as PO trials  progressed c/b fatigue    Post-prandial vocal quality: clear, unchanged to baseline following coughing  episodes        **Impression** : Arthur Young continues to present with an impaired  oropharyngeal swallow mechanism despite improvements in mental status and  level of alertness c/b suspicion for premature posterior spillage into Arthur  hypopharynx prior to Arthur initiation of pharyngeal trigger, delayed pharyngeal  trigger and reduced hyolaryngeal elevation and excursion resulting  in overt  s/s of aspiration with thin liquids via cup sip, more pronounced as PO trials  progressed c/b fatigue requiring yanker suctioning to facilitate airway  protection.  No overt s/s of aspiration noted with remainder of conservative  PO trials.  Given Young's clinical presentation and ongoing evidence of  oropharyngeal dysphagia, Arthur Young remains at a heightened risk for  aspiration and subsequent medical complications; resuming a PO diet remains  contraindicated at this time.  Continue alternate means of nutrition,  hydration and medication.  It is reasonable to allow ice chips, sips of water  and essential medication with puree as tolerated while maintaining aspiration  precautions to provide oral comfort, strengthen pharyngeal musculature and  prevent additional none use atrophy.  Will continue to follow for ongoing  assessment of oropharyngeal swallow mechanism to determine ability to safely  resume a PO diet vs need for instrumental study.        **Recommendations** :    1\. NPO except ice chips, sips of water and essential medication with puree as  tolerated              -  Upright 90 degrees           -  Only offer when awake/alert and engaging           -  Ensure stable respiratory status prior to PO intake           -  Withhold given overt s/s of aspiration or decline in mental/respiratory status       2\. Continue alternate means of nutrition, hydration and medication at Arthur  discretion of primary team    3\. Oral care with moisture 2-3x/shift    4\. Will follow 3-5x/week for ongoing assessment of oropharyngeal swallow  mechanism        Case discussed with Young and NSG; primary team paged with recommendations.        Please page with questions or concerns.        Orlinda Blalock, MS, CCC-SLP    Department of Speech-Language Pathology    Pager: [phone] or [facility] Text                **Electronically signed by Orlinda Blalock, CCC-SLP on 07/12/2020 10:49**

## 2020-07-04 NOTE — Progress Notes (Signed)
 Supervisory Note For Fellow I performed a history and physical examination of  the patient and discussed the management with the fellows. I reviewed the  fellow's note and agree with the documented findings and plan of care. Yes,    Additional Notes    CXR from yesterday is looking much better- no vascular congestion so would  recommend stopping diuresis    Diamox for metabolic alkalosis    Also has a worsening respiratory acidosis- currently on BIPAP.            **Electronically signed by Margret Chance, MD on 07/10/2020 12:54**

## 2020-07-04 NOTE — Progress Notes (Signed)
 **Subjective**    Subjective Overnight:    No acute events.        This morning:    Patient feels well and is requesting to eat; is hopeful SLP evaluation will go  well. He is keen on going home. No difficulty breathing, no cough. Reports no  bowel movement yet but on further discussion with RN she confirms he had bowel  movement later in AM.    **Subjective**    Subjective Overnight:    No acute events.        This morning:    .    **Exam**    Comment Vitals per Soarian.        Gen: in NAD, comfortable in chair at bedside    HEENT: DHT in place, nasal cannula    Cardio: RRR, no MRGs    Lungs: normal WOB, bilateral crackles at the bases L>R    Abd: ND, NT    Ext: WWP, no pitting edema    Skin: darkening of bilateral shins ?venous stasis.      Vital Signs    07/15/2020 06:22              -  Temperature: 36 (35-37.8Cel)          -  Site: Oral          -  Heart Rate: 100H (60-90)          -  Site: Monitor          -  BP: 96/75 (90-140/60-90)          -  Site: Left Arm          -  Position: Sitting          -  Method: Automated          -  Respirations: 20 (12-20)          -  O2 Saturation (%): 97          -  O2 Delivery Method: Nasal Cannula          -  MEWS Vital Sign Score: 2      07/14/2020 21:00              -  Morse Fall Risk Total: 35      07/14/2020 06:21              -  Weight: 67.9kg      07/14/2020 04:52              -  O2 Amount: 2.0 LPM      CFS Vital Signs CS    07/15/2020 06:59              -  Shift Intake Total: 0ml          -  Shift Output Total:          -  Shift Balance: -          I have reviewed and agree with vital signs as listed in the EMR Yes Time  07/15/2020 07:10.    **Exam**    Comment Vitals per Soarian.        Gen: in NAD, comfortable in chair at bedside    HEENT: DHT in place, nasal cannula    Cardio: RRR, no MRGs    Lungs: normal WOB, bilateral crackles at the bases L>R, expiratory wheeze R  lower lung field    Abd: +BS, ND, NT  Ext: WWP, no  pitting edema    Skin: darkening of bilateral shins ?venous stasis    Neuro: AAOx3, nonfocal.      Vital Signs    07/15/2020 06:22              -  Temperature: 36 (35-37.8Cel)          -  Site: Oral          -  Heart Rate: 100H (60-90)          -  Site: Monitor          -  BP: 96/75 (90-140/60-90)          -  Site: Left Arm          -  Position: Sitting          -  Method: Automated          -  Respirations: 20 (12-20)          -  O2 Saturation (%): 97          -  O2 Delivery Method: Nasal Cannula          -  MEWS Vital Sign Score: 2      07/14/2020 21:00              -  Morse Fall Risk Total: 35      07/14/2020 06:21              -  Weight: 67.9kg      07/14/2020 04:52              -  O2 Amount: 2.0 LPM      CFS Vital Signs CS    07/15/2020 06:59              -  Shift Intake Total: 0ml          -  Shift Output Total:          -  Shift Balance: -          I have reviewed and agree with vital signs as listed in the EMR Yes Time  07/15/2020 07:10.    **Assessment and Plan**    Medication              -   **HYDRALAZINE (APRESOLINE)** 25 MG Oral TID for 57 Days           -   **FUROSEMIDE (LASIX)** 40 MG Oral QDAY First Dose Now for 60 Days           -   **PREDNISONE (DELTASONE)** 40 MG Oral QDAY First Dose Now for 60 Days           -   **INSULIN GLARGINE(LANTUS) (INSULIN LANTUS)** 4 UNITS Subcutaneous QHS for 60 Days           -   **POLYETHYLENE GLYCOL (MIRALAX)** 17 G Oral BID for 60 Days           -   **METOPROLOL (LOPRESSOR)** 6.25 MG Oral QID for 60 Days           -   **CAPTOPRIL (CAPOTEN)** 6.25 MG Oral TID First Dose Now for 60 Days           -   **MIRTAZAPINE (REMERON)** 15 MG Oral QHS for 60 Days           -   **OLANZAPINE (ZYPREXA)** 10 MG Oral QHS for 60 Days           -   **  QUETIAPINE FUMARATE (SEROQUEL)** 300 MG Oral QHS for 60 Days           -   **CARBAMAZEPINE (TEGRETOL)** 600 MG Orogastric Tube QPM for 60 Days           -   **GUAIFENESIN  SYRUP** 200 MG Oral Q4H First Dose Now           -   **CARBAMAZEPINE (TEGRETOL)** 400 MG Orogastric Tube QAM for 60 Days           -   **DOCUSATE SODIUM** 100 MG Nasogastric BID for 60 Days           -   **HYDRALAZINE (APRESOLINE)** 10 MG Intravenous Q6HPRN SBP > 180 mmHg for 60 Days, Clinician Dir:IV PUSH FOR ADULTS PER IV GUIDELINE           -   ** ** ** **INSULIN REGULAR HUMAN (HUMULIN R)******** Sliding Scale Subcutaneous Q6H for 60 Days      For Glucose 80 to 150 Give 0 Units    For Glucose 151 to 200 Give 2 Units    For Glucose 201 to 250 Give 3 Units    For Glucose 251 to 300 Give 4 Units    Notify Physician if: Call HO for glucose > 300          -   **BISACODYL (DULCOLAX)** 10 MG Rectal QDAYPRN Constipation for 60 Days, Clinician Dir:NOT RELIEVED BY PO           -   **SODIUM CL 0.9% FOR INH (STOCK) (SODIUM CHLORIDE NEB(STOCK))** 3 ML Inhalation Q4H for 60 Days           -   **SENNA (SENOKOT)** 17.2 MG Oral QDAY for 60 Days           -   **ACETAMINOPHEN (TYLENOL)** 650 MG Oral Q6HPRN Pain, Fever, Clinician Dir:NOT TO EXCEED 4 G APAP PER DAY           -   **ATORVASTATIN (LIPITOR)** 80 MG Oral QHS for 60 Days           -   **TAMSULOSIN (FLOMAX)** 0.4 MG Oral QHS for 60 Days           -   **DORZOLAMIDE 2% OPHTH (TRUSOPT 2% OPHTH)** 1 DROP Both Eyes SOL BID for 60 Days           -   **ASPIRIN CHEWABLE** 81 MG Oral QDAY for 60 Days           -   **BRIMONIDINE 0.2% OPHTH (ALPHAGAN 0.2% OPHTH)** 1 DROP Both Eyes SOL TID for 60 Days           -   **IPRATROPIUM/ALBUTEROL SULFATE (DUONEB)** 3 ML Inhalation Q6H for 60 Days           -   **LACTULOSE (CEPHULAC)** 10 G Oral QHSPRN Constipation for 60 Days           -   **HEPARIN** 5000 UNITS Subcutaneous Q8H First Dose Now for 60 Days           -   **BISACODYL E.C. (BISACODYL)** 10 MG Oral QDAYPRN Constipation for 60 Days           -   **BUDESONIDE INHALATION (PULMICORT RESPULES)** 1 MG Inhalation BID First Dose Now for 60 Days            Comment 45M w/ pmhx significant for HFrEF (BiV dysfunction with EF 20-25% and  severely reduced RV  function 05/2019), COPD (not on home O2 and current tobacco  use), pulmonary nodules, HTN, Type II DM and Schizoaffective disorder, who  presented with shortness of breath and fevers 2/24 and was found to have  hypoxic and hypercapnic respiratory failure concerning for pneumonia,  decompensated heart failure, and COPD exacerbation. Transfered out of MICU 3/4  after stabilization of respiratory status.            #Acute hypoxic and hypercapneic respiratory failure    #Pneumonia    #Acute exacerbation of COPD (not on home O2)    #Pulmonary Nodules    H/o COPD (last PFTS 07/21/19 with FEV1 24% with FEV1/FVC 40% GOLD IV), prior  admission 01/2020 for Pseudomonas pneumonia. Presented with 3 days of SOB,  fevers, and difficulty walking 2/2 DOE. Likely overall acute hypoxic  respiratory failure multifactorial in nature, primary pneumonia (leukocytosis,  fever, CT findings of GGOs and consolidative opacity of entire LLL), with  component of CHF exacerbation (edematous, weight gain) and COPD exacerbation  (wheezing on initial exam, mild respiratory acidosis).    Admitted to MICU d/t high flow requirement. Started on cefepime (prior PsA)  and azithromycin (2/24 - 2/27) and then placed on meropenam for 7d course.  Sputum unable to be produce for cx. Urine legionella/strep negative.    Diuresed w/ initial poor response.    Also started on prednisone for COPD component (2/24 - 2/26) and restarted IV  solumederol due to worsening status once prednisone stopped (again stopped  3/5).    Initial improvement, then with periods of worsening hypercapnia and somnolence  which was managed with up-titration of BiPAP settings and increased steroid  dose. Finally weaned to [facility] 3/3 and transferred out of MICU 3/4.    - Titrate supplemental oxygen as tolerated for goal SpO2 88-92%; goal is to  stay off NIPPV if able, if rescue needed had been up  to 22/7 prior.    - Continue PO pred 40mg  (started 3/5) with plan to taper q3d --> pred 30mg   3/8.    - Continue standing Duonebs, Budesonide, mucolytics.    - Chest PT q4hr and Saline nebs q4hr.    - Follow-up chest CT in 6-8 weeks for pulmonary nodules.        #Acute on chronic systolic heart failure (LVEF 20-25% 01/2020)    #BiV dysfunction (Severely Reduced RV function)    #Type II NSTEMI 2/2 Demand Ischemia    #HLD    Known h/o systolic HFL last TTE (01/2020) w/ LVEF 20-25%, global LV  hypokinesis, RV dilation with severely reduced systolic function, 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 Initial picture of volume overload leading to acute  hypoxic respiratory failure per above.    EKG without ischemic findings, trop peaked 0.12 and down-trended. BNP 737  (down from 1085 01/2020 admit), weight up 84kg (dry wt 75kg). Previous admits  for CHF, unclear follow up. Previously discharged on Toprol, last filled  Lopressor 25mg  BID 04/2020; unclear current BB regimen ongoing vs d/c'd.  Previously on Aldactone, d/c'd d/t hyperkalemia. Was planned to consider  ACE-I/SGLT-2 outpatient.    TTE 2/25 noted known findings of BiV dysfunction (EF 20-25%), RV  moderately/severely dilated and severely reduced RV, RA dilation, elevated RA  pressure, RV overload mild MR, severe TR, trace AR, moderate PR. Aggressively  diuresed with Lasix infusion in MICU, with good volume output, improved  respiratory status, and decreased edema. UOP remains  brisk without  intervention aside from home Lasix.    - GDMT:    --- Uptitrate metoprolol to 12mg  q6h.    --- C/w captopril 6.25mg  tid.    - Continue home furosemide 40mg  qd.    - Strict I/Os.    - Stop hydralazine 25mg  tid to allow for uptitration of GDMT.    - Possible RHC +/- this admit to further evaluate ? PAH.    - Continue home ASA and atorvastatin.        #AKI, improving    #Metabolic Alkalosis, resolved     #Hyperkalemia, recurrent    #Hypernatremia, resolved    Presented with worsening kidney function from baseline Cr 1.4, with a peak Cr  to 4.14, thought likely secondary to cardiorenal syndrome. Cr slowly  improving. Aggressively diuresed (>9L negative) with Lasix / Diuril with  improvement in respiratory status / edema, also development of likely  contraction metabolic alkalosis requiring intermittent Diamox, now resolved.    Then, developed worsening hypernatremia. Brisk UOP thought likely to represent  post-ATN autodiuresis.    As of 3/7, developing recurrent hyperkalemia and decreasing sodium.    - Repeat PM lytes to trend potassium. EKG.    - Low K diet.    - Diuresis per above.    - Stop FWF given stopping TF.        #Toxic Metabolic Encephelopathy, resolved    #Schizoaffective disorder    Reportedly baseline memory issues/confusion. Concern during course for  gradually worsening mental status in setting of increasing hypercarbia,  managed previously w/ BiPAP etc. Also d/t concern for aspiration made NPO and  several home maintenance meds were hold. Required Precedex in ICU. Now remains  intermittently delirious though improving. Likely some ICU delirium as well.    - Cont home Carbamazepine 400mg  qAM / 600mg  qHS.    - Cont home Mirtazapine 15mg  qHS.    - Cont home Zyprexa 10mg  qHS.    - Cont home Seroquel 300mg  qHS.    - EKG for QTc every couple of days. Obtain 3/7.        #Nutrition    Mental status worsening in setting of hypercarbia / delirium, made NPO after  SLP eval. Small-bore DHT placed 3/2. Cleared by SLP 3/7 for regular diet +  thin liquids.    - Remove DHT.        #Constipation    - Aggressive bowel regiment.        #T2DM    Last A1C 7.5 (01/2020)    - Holding home Metformin.    - SSI q6h.    - Start Lantus 4u qhs tonight given persistent hyperglycemia, especially on  steroids.            FULL CODE    low K diet    SQH  PPI stopped    Daughter: Donnie Aho Keo: [phone].    **Assessment and  Plan**    Medication              -   **HYDRALAZINE (APRESOLINE)** 25 MG Oral TID for 57 Days           -   **FUROSEMIDE (LASIX)** 40 MG Oral QDAY First Dose Now for 60 Days           -   **PREDNISONE (DELTASONE)** 40 MG Oral QDAY First Dose Now for 60 Days           -   **INSULIN GLARGINE(LANTUS) (INSULIN LANTUS)** 4 UNITS Subcutaneous  QHS for 60 Days           -   **POLYETHYLENE GLYCOL (MIRALAX)** 17 G Oral BID for 60 Days           -   **METOPROLOL (LOPRESSOR)** 6.25 MG Oral QID for 60 Days           -   **CAPTOPRIL (CAPOTEN)** 6.25 MG Oral TID First Dose Now for 60 Days           -   **MIRTAZAPINE (REMERON)** 15 MG Oral QHS for 60 Days           -   **OLANZAPINE (ZYPREXA)** 10 MG Oral QHS for 60 Days           -   **QUETIAPINE FUMARATE (SEROQUEL)** 300 MG Oral QHS for 60 Days           -   **CARBAMAZEPINE (TEGRETOL)** 600 MG Orogastric Tube QPM for 60 Days           -   **GUAIFENESIN SYRUP** 200 MG Oral Q4H First Dose Now           -   **CARBAMAZEPINE (TEGRETOL)** 400 MG Orogastric Tube QAM for 60 Days           -   **DOCUSATE SODIUM** 100 MG Nasogastric BID for 60 Days           -   **HYDRALAZINE (APRESOLINE)** 10 MG Intravenous Q6HPRN SBP > 180 mmHg for 60 Days, Clinician Dir:IV PUSH FOR ADULTS PER IV GUIDELINE           -   ** ** ** **INSULIN REGULAR HUMAN (HUMULIN R)******** Sliding Scale Subcutaneous Q6H for 60 Days      For Glucose 80 to 150 Give 0 Units    For Glucose 151 to 200 Give 2 Units    For Glucose 201 to 250 Give 3 Units    For Glucose 251 to 300 Give 4 Units    Notify Physician if: Call HO for glucose > 300          -   **BISACODYL (DULCOLAX)** 10 MG Rectal QDAYPRN Constipation for 60 Days, Clinician Dir:NOT RELIEVED BY PO           -   **SODIUM CL 0.9% FOR INH (STOCK) (SODIUM CHLORIDE NEB(STOCK))** 3 ML Inhalation Q4H for 60 Days           -   **SENNA (SENOKOT)** 17.2 MG Oral QDAY for 60 Days           -   **ACETAMINOPHEN (TYLENOL)** 650 MG Oral Q6HPRN  Pain, Fever, Clinician Dir:NOT TO EXCEED 4 G APAP PER DAY           -   **ATORVASTATIN (LIPITOR)** 80 MG Oral QHS for 60 Days           -   **TAMSULOSIN (FLOMAX)** 0.4 MG Oral QHS for 60 Days           -   **DORZOLAMIDE 2% OPHTH (TRUSOPT 2% OPHTH)** 1 DROP Both Eyes SOL BID for 60 Days           -   **ASPIRIN CHEWABLE** 81 MG Oral QDAY for 60 Days           -   **BRIMONIDINE 0.2% OPHTH (ALPHAGAN 0.2% OPHTH)** 1 DROP Both Eyes SOL TID for 60 Days           -   **IPRATROPIUM/ALBUTEROL SULFATE (DUONEB)** 3 ML  Inhalation Q6H for 60 Days           -   **LACTULOSE (CEPHULAC)** 10 G Oral QHSPRN Constipation for 60 Days           -   **HEPARIN** 5000 UNITS Subcutaneous Q8H First Dose Now for 60 Days           -   **BISACODYL E.C. (BISACODYL)** 10 MG Oral QDAYPRN Constipation for 60 Days           -   **BUDESONIDE INHALATION (PULMICORT RESPULES)** 1 MG Inhalation BID First Dose Now for 60 Days           Comment 1M w/ pmhx significant for HFrEF (BiV dysfunction with EF 20-25% and  severely reduced RV function 05/2019), COPD (not on home O2 and current tobacco  use), pulmonary nodules, HTN, Type II DM and Schizoaffective disorder, who  presented with shortness of breath and fevers 2/24 and was found to have  hypoxic and hypercapnic respiratory failure concerning for pneumonia,  decompensated heart failure, and COPD exacerbation. Transfered out of MICU  3/4.            #Acute hypoxic and hypercapneic respiratory failure    #Pneumonia    #Acute exacerbation of COPD (not on home O2)    #Pulmonary Nodules    H/o COPD (last PFTS (07/21/19) FEV1 24% with FEV1/FVC 40% GOLD IV), prior  admission 01/2020 for Pseudomonas pneumonia. Presented with 3 days of SOB,  fevers, and difficulty walking 2/2 DOE. Found to be febrile, with  leukocytosis, and CT revealing consolidative opacity with surrounding patchy  groundglass opacities involving the entire left lower lobe concerning for pna.  Likely overall acute hypoxic  respiratory failure multifactorial in nature,  primary pneumonia (WBC, fever, imaging) with component of CHF exacerbation  (edematous, wt gain) and COPD exacerbation (wheezing on exam, mild respiratory  acidosis). Admitted to MICU d/t high flow requirement. Started on cefepime  (prior PSA) and azithromycin 2/24 - 2/27) and then placed on Meropenam for 7d  course. Sputum unable to be produce for cx. Diuresed w/ initial poor response.  Also started on Prednisone for COPD component (2/24 - 2/26) and restarted IV  solumederol due to worsening status once prednisone stopped. Urine  legionella/strep negative. Initial improvement, then with periods of worsening  hypercapnia and somnolence. Managed with up-titration of BiPAP settings and  increased steroid dose. Weaned to NC 3/3.    - Titrate supplemental oxygen as tolerated for goal SpO2 88-92%; goal stay  off NIPPV if able, if rescue needed had been up to 22/7.    - Continue Solumedrol 60mg  (started 3/2- ), wean q6h --> q12h-->q24hr -->PO  pred 40mg  starting 3/5 with plan to taper.    - Continue standing Duonebs, Budesonide, mucolytics.    - Chest PT q4hr and Saline nebs q4hr.    - Follow-up chest CT in 6-8 weeks for pulmonary nodules.        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 Initial picture of likely volume overload leading to  acute hypoxic respiratory failure. EKG without ischemic findings, trop  peaked  0.12 and down-trended. BNP 737 (down from 1085 01/2020 admit), weight up 84kg  (dry wt 75kg). Previous admits for CHF, unclear follow up (planned to f/u w/  Renne Crigler). At that time discharged on  Toprol, last filled Lopressor 25mg   BID 04/2020, unclear current BB regimen ongoing vs d/c'd. Previously on  Aldactone, d/c'd d/t hyperkalemia. Planned to consider ACE-I/SGLT-2  outpatient.    TTE 2/25 noted known findings of BiV dysfunction (EF 20-25%), RV  moderately/severely dilated and severely reduced RV, RA dilation, elevated RA  pressure, RV overload mild MR, severe TR, trace AR, moderate PR. Aggressively  diuresed with Lasix infusion, good volume output, improved respiratory status  and edema. Have since held and UOP remains brisk without intervention.    - GDMT:    --- C/w metoprolol 6.25mg  q6h.    --- C/w captopril 6.25mg  tid.    - Hold on further diuresis, UOP adequate without intervention; goal even to  net negative; will give Lasix 40mg  PO this morning.    - Decrease hydralazine 25mg  tid to allow for uptitration of GDMT    - Would benefit from RHC +/- this admit to further evaluate ? PAH.    - Consider inpatient HF consult for GDMT optimization as patient did not  follow up outpatient.    - Continue home ASA and Atorvastatin.        #AKI    #Metabolic Alkaolsis    #Hyperkalemia - Resolved    #Hypernatremia    Worsening kidney function from baseline Cr 1.4. Peak Cr of 4.14 likely  secondary to cardiorenal syndrome. Cr slowly improving. Aggressively diuresed  (>9L negative) with Lasix / Diuril with improvement in respiratory status /  edema, also development of likely contraction metabolic alkalosis.  Intermittent Diamox dosing. Now with development of worsening hypernatremia.  Brisk UOP despite holding further diuresis likely represents post-ATN  autodiuresis.    - Lasix 40mg  PO today, goal even to net negative; diuresis per above.    - Decrease FWF 300 q12h. If worsening will need to consider D5W.        #Toxic Metabolic Encephelopathy - Improving    #Schizoaffective disorder    Reportedly baseline memory issues/confusion. Concern during course for  gradually worsening mental status in setting of  increasing hypercarbia,  managed previously w/ BiPAP etc. Also d/t concern for aspiration made NPO and  several home maintenance meds were hold. Required Precedex in ICU. Now remains  intermittently delirious though improving. Likely some ICU delirium as well.    - Cont home Carbamazepine 400mg  qAM / 600mg  qHS.    - Cont home Mirtazapine 15mg  qHS.    - Cont home Zyprexa 10mg  qHS.    - Cont home Seroquel 300mg  qHS.    - EKG for QTc.        #Nutrition    #Constipation    Mental status worsening in setting of hypercarbia / delirium, made NPO after  SLP eval. Small-bore DHT placed 3/2.    - Nutrition consult, start TF per recs.    - SLP team to follow for readiness.    - Continue Colace; PRN Dulcolax if needed.        #T2DM    Last A1C 7.5 (01/2020)    - Holding home Metformin.    - SSI q6h.    - Start Lantus 4u qhs tonight given persistent hyperglycemia, especially on  steroids.            FULL CODE  NPO + TF via DHT    SQH  PPI stopped    Daughter: Hulda Humphrey: [phone]    Social: Pt lives in a group home.            **In Progress, created by Arnetha Gula, MD on 07/15/2020 07:10**        **Electronically signed by Arnetha Gula, MD on 07/15/2020 20:19**

## 2020-07-04 NOTE — Progress Notes (Signed)
 Supervisory Note For Fellow I performed a history and physical examination of  the patient and discussed the management with the fellows. I reviewed the  fellow's note and agree with the documented findings and plan of care. Yes.            **Electronically signed by Margret Chance, MD on 07/14/2020 21:31**

## 2020-07-04 NOTE — Progress Notes (Signed)
 **Subjective**    Reason for Referral AKI, diuretic resistance.    Subjective Creatinine peaked at 4.14 yesterday and down to 3.92 today    Vanco levels have been supratherapeutic over the weekend, 36.6 at 0415 on  2/27, 9 hours after 1g given (2/26 1838)    24 hour I/O 1.5/5.1 net -3.6L on lasix 40mg /hr + diuril    HFNC weaning FIO2.    **Exam**    Vital Signs    07/07/2020 22:00              -  O2 Amount: 50%          -  Morse Fall Risk Total: 20      07/07/2020 18:47              -  Heart Rate: 95H (60-90)          -  BP: 117/66 (90-140/60-90)      CFS Vital Signs CS    07/08/2020 06:59              -  Shift Intake Total:          -  Shift Output Total:          -  Shift Balance: -      07/08/2020 06:00              -  Pain: 0          -  Riker: 4 - Calm, awakens easily,follows commands          -  Temp Calc: 37.80          -  Heart Rate: 86          -  Cardiac Rhythm: NSR - Normal Sinus Rhythm          -  BP Systolic: 105          -  BP Diastolic: 68          -  MAP: 79          -  BP Site: Left Arm          -  BP Position: Lying          -  BP Source: NIBP          -  Respirations: 18          -  SPO2 %: 94 (89-100)          -  Mode: Hi Flow          -  FiO2: 40%, 50L          -  IV Infusion 1: KVO - NS          -  IV Dose 1: 5 ml/hr          -  ml/hour 1: 5 ml/hr          -  IV Infusion 2: furosemide          -  IV Dose 2: 40mg /hr          -  ml/hour 2: 20 ml/hr          -  IV Infusion 3: Dexmedetomidine 469mcg/100ml          -  IV Dose 3: 0.6 mcg/kg/hr          -  ml/hour 3: 12.6 ml/hr      07/08/2020 02:42              -  Call Bell Within Reach: Yes          -  High Fall Risk Interventions: Yes          -  HOB Elevation: 30      07/08/2020 02:00              -  EtCO2: 94      07/08/2020 00:00              -  Repositioned Side: Back          -  Oral Hygiene Q4: Yes          -  Foley care per protocol: Yes       07/07/2020 20:00              -  Temp Site: Axillary          I have reviewed and agree with vital signs as listed in the EMR Yes Time  07/08/2020 10:08.    Comment Wrinkles on face and hands    Crackles at bases    1+ LE edema.    **Labs**    All current labs have been reviewed by myself as of 07/08/2020 10:08.    **Assessment and Plan**    Medication              -   **BISACODYL (DULCOLAX)** 10 MG Rectal QDAYPRN Constipation for 60 Days, Clinician Dir:NOT RELIEVED BY PO           -   **DEXMEDETOMIDINE 400 MCG/100 ML (PRECEDEX PREMIX)** CONTINUOUS Intravenous INFUSION for 60 Days, Clinician ZOX:WRUEAVW TO RIKER SAS 3-4 START AT 0.2 MCG/KG/HR. MAY INCREASE BY 0.1 MCG/KG/HR Q15MIN TO A MAX OF 1.5 MCG/KG/HR.           -   **FUROSEMIDE (LASIX)** 200 MG Intravenous INFUSION STAT and then Routine, Clinician UJW:JXBJ = 40 MG/HR. CONC = 2 MG/ML. MESSAGE PHARMACY 2 HOURS BEFORE NEXT BAG IS DUE. PROTECT FROM LIGHT.           -   **MEROPENEM (MERREM)** 500 MG Intravenous Q12H First Dose Now for 60 Days, Clinician YNW:GNFAOZ TO 100 ML NS MED PLUS. INFUSE FIRST DOSE OVER 30 MINUTES AND ALL SUBSEQUENT DOSES OVER 3 HOURS.           -   **SODIUM CL 0.9% FOR INH (STOCK) (SODIUM CHLORIDE NEB(STOCK))** 3 ML Inhalation Q4H for 60 Days           -   **SENNA (SENOKOT)** 17.2 MG Oral QDAY for 60 Days           -   **POLYETHYLENE GLYCOL (MIRALAX)** 17 G Oral QDAY First Dose Now for 60 Days           -   **OLANZAPINE (ZYPREXA)** 10 MG Oral QHS STAT and then Routine for 60 Days, Clinician Dir:MAX DAILY DOSE: 20 MG/DAY           -   **ACETAMINOPHEN (TYLENOL)** 650 MG Oral Q6HPRN Pain, Fever, Clinician Dir:NOT TO EXCEED 4 G APAP PER DAY           -   **PANTOPRAZOLE (PROTONIX)** 40 MG Intravenous QDAY First Dose Now for 60 Days           -   **ATORVASTATIN (LIPITOR)** 80 MG Oral QHS for 60 Days           -   **TAMSULOSIN (FLOMAX)** 0.4 MG Oral QHS for 60 Days           -   **  CARBAMAZEPINE (TEGRETOL)** 400 MG Oral  QAM for 60 Days           -   **ASPIRIN CHEWABLE** 81 MG Oral QDAY for 60 Days           -   **DOCUSATE SODIUM (COLACE)** 100 MG Oral BID for 60 Days           -   **BRIMONIDINE 0.2% OPHTH (ALPHAGAN 0.2% OPHTH)** 1 DROP Both Eyes SOL TID for 60 Days           -   **DORZOLAMIDE 2% OPHTH (TRUSOPT 2% OPHTH)** 1 DROP Both Eyes SOL BID for 60 Days           -   ** ** ** **INSULIN LISPRO (HUMALOG) (HUMALOG)******** Sliding Scale Subcutaneous TIDWM for 60 Days      For Glucose 80 to 150 Give 0 Units    For Glucose 151 to 200 Give 2 Units    For Glucose 201 to 250 Give 3 Units    For Glucose 251 to 300 Give 4 Units    Notify Physician if: Call HO for glucose > 300          -   **IPRATROPIUM/ALBUTEROL SULFATE (DUONEB)** 3 ML Inhalation Q6H for 60 Days           -   **GUAIFENESIN SYRUP** 200 MG Oral Q4H First Dose Now for 60 Days           -   **MIRTAZAPINE (REMERON)** 15 MG Oral QHS First Dose Now for 60 Days           -   **QUETIAPINE FUMARATE (SEROQUEL)** 400 MG Oral QHS First Dose Now for 60 Days           -   **CARBAMAZEPINE (TEGRETOL)** 600 MG Oral QPM First Dose Now for 60 Days           -   **LACTULOSE (CEPHULAC)** 10 G Oral QHSPRN Constipation for 60 Days           -   **HEPARIN** 5000 UNITS Subcutaneous Q8H First Dose Now for 60 Days           -   **BISACODYL E.C. 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 Has had proteinuria since at least 2020 so  potentially diabetic kidney disease.  AKI is likely pre-renal in the setting of  CRS.    - Good urine output on lasix gtt and diuril. Agree with continued  decongestion. Goal negative ~2L per day.    - Dose medication for GFR 10-15 ml/min and vancomycin by level.            **Electronically signed by Almeta Monas, MD on 07/08/2020 18:49**

## 2020-07-04 NOTE — Progress Notes (Signed)
 **Subjective**    Subjective Overnight:    No acute events.        This morning:    Feels well, no complaints. Urinating freely. No bowel movement yet. Breathing  is stable.    **Subjective**    Subjective Overnight:    No acute events.        This morning:    Feels well, no complaints. Urinating freely. No bowel movement yet. Breathing  is stable.    **Exam**    Comment Vitals per So[facility].        Gen: in NAD, comfortable in chair at bedside    HEENT: DHT in place, nasal cannula    Cardio: RRR, no MRGs    Lungs: normal WOB, bilateral crackles at the bases L>R    Abd: ND, NT    Ext: WWP, no pitting edema    Skin: darkening of bilateral shins ?venous stasis.      Vital Signs    07/12/2020 19:46              -  O2 Amount: 2.0 LPM          -  Morse Fall Risk Total: 50      07/12/2020 14:27              -  Heart Rate: 88 (60-90)          -  BP: 119/75 (90-140/60-90)      07/12/2020 03:39              -  Weight: 64.3kg      CFS Vital Signs CS    07/13/2020 06:59              -  Shift Intake Total:          -  Shift Output Total:          -  Shift Balance:      07/13/2020 06:00              -  Pain: 0          -  Riker: 4 - Calm, awakens easily,follows commands          -  Heart Rate: 85          -  Cardiac Rhythm: NSR - Normal Sinus Rhythm          -  BP Systolic: 110          -  BP Diastolic: 69          -  MAP: 82          -  BP Site: Left Arm          -  BP Position: Lying          -  BP Source: NIBP          -  Respirations: 13          -  SPO2 %: 97 (89-100)          -  O2 Amount: 2.0 LPM          -  Mode: Nasal Cannula          -  Call Bell Within Reach: Yes          -  High Fall Risk Interventions: Yes          -  HOB Elevation: 30          -  Enteral Feeding: Nutren 1.0          -  Enteral Feeding Rate: 70cc/hr          -  IV Infusion 1: Normal Saline (KVO/Meds)          -  IV Dose 1: 5 ml/hr          -  ml/hour 1: 5 ml/hr       07/13/2020 04:00              -  Temp Calc: 36.50          -  Temp Site: Oral      07/12/2020 17:00              -  Repositioned Side: Back      07/12/2020 13:00              -  BP Systolic-2: 159          -  BP Diastolic-2: 72          -  MAP-2: 95          -  Site-2: Right Arm          -  Position-2: Sitting          -  Source-2: Arterial      07/12/2020 10:00              -  IV Infusion 2: Dexmedetomidine 443mcg/100ml          -  IV Dose 2: off          -  ml/hour 2: off      07/12/2020 09:00              -  Posterior Tibial: Right - Palpable,Left - Palpable          -  Dorsalis Pedis: Right - Palpable,Left - Palpable          -  Radial: Right - Palpable,Left - Palpable      07/12/2020 04:00              -  Oral Hygiene Q4: Yes          I have reviewed and agree with vital signs as listed in the EMR Yes Time  07/13/2020 07:21.    **Exam**    Comment Vitals per So[facility].        Gen: in NAD, comfortable in chair at bedside    HEENT: DHT in place, nasal cannula    Cardio: RRR, no MRGs    Lungs: normal WOB, bilateral crackles at the bases L>R    Abd: ND, NT    Ext: WWP, no pitting edema    Skin: darkening of bilateral shins ?venous stasis.      Vital Signs    07/12/2020 19:46              -  O2 Amount: 2.0 LPM          -  Morse Fall Risk Total: 50      07/12/2020 14:27              -  Heart Rate: 88 (60-90)          -  BP: 119/75 (90-140/60-90)      07/12/2020 03:39              -  Weight: 64.3kg      CFS Vital Signs CS    07/13/2020 06:59              -  Shift Intake Total:          -  Shift Output Total:          -  Shift Balance:      07/13/2020 06:00              -  Pain: 0          -  Riker: 4 - Calm, awakens easily,follows commands          -  Heart Rate: 85          -  Cardiac Rhythm: NSR - Normal Sinus Rhythm          -  BP Systolic: 110          -  BP Diastolic: 69          -  MAP: 82          -  BP Site: Left Arm           -  BP Position: Lying          -  BP Source: NIBP          -  Respirations: 13          -  SPO2 %: 97 (89-100)          -  O2 Amount: 2.0 LPM          -  Mode: Nasal Cannula          -  Call Bell Within Reach: Yes          -  High Fall Risk Interventions: Yes          -  HOB Elevation: 30          -  Enteral Feeding: Nutren 1.0          -  Enteral Feeding Rate: 70cc/hr          -  IV Infusion 1: Normal Saline (KVO/Meds)          -  IV Dose 1: 5 ml/hr          -  ml/hour 1: 5 ml/hr      07/13/2020 04:00              -  Temp Calc: 36.50          -  Temp Site: Oral      07/12/2020 17:00              -  Repositioned Side: Back      07/12/2020 13:00              -  BP Systolic-2: 159          -  BP Diastolic-2: 72          -  MAP-2: 95          -  Site-2: Right Arm          -  Position-2: Sitting          -  Source-2: Arterial      07/12/2020 10:00              -  IV Infusion 2: Dexmedetomidine 468mcg/100ml          -  IV Dose 2: off          -  ml/hour 2: off      07/12/2020 09:00              -  Posterior Tibial: Right - Palpable,Left - Palpable          -  Dorsalis Pedis: Right - Palpable,Left - Palpable          -  Radial: Right - Palpable,Left - Palpable      07/12/2020 04:00              -  Oral Hygiene Q4: Yes          I have reviewed and agree with vital signs as listed in the EMR Yes Time  07/13/2020 07:21.    **Assessment and Plan**    Medication              -   **POLYETHYLENE GLYCOL (MIRALAX)** 17 G Oral BID for 60 Days           -   **HYDRALAZINE (APRESOLINE)** 50 MG Oral TID for 59 Days           -   **METOPROLOL (LOPRESSOR)** 6.25 MG Oral QID for 60 Days           -   **CAPTOPRIL (CAPOTEN)** 6.25 MG Oral TID First Dose Now for 60 Days           -   **METHYLPREDNISOLONE (SOLU-MEDROL)** 60 MG Intravenous QDAY for 59 Days, Clinician Dir:IV PUSH OVER 3 -5 MINUTES           -   **MIRTAZAPINE (REMERON)** 15 MG Oral QHS for 60 Days            -   **QUETIAPINE FUMARATE (SEROQUEL)** 300 MG Oral QHS for 60 Days           -   **OLANZAPINE (ZYPREXA)** 10 MG Oral QHS for 60 Days           -   **CARBAMAZEPINE (TEGRETOL)** 600 MG Orogastric Tube QPM for 60 Days           -   **GUAIFENESIN SYRUP** 200 MG Oral Q4H First Dose Now           -   **CARBAMAZEPINE (TEGRETOL)** 400 MG Orogastric Tube QAM for 60 Days           -   **DOCUSATE SODIUM** 100 MG Nasogastric BID for 60 Days           -   **HYDRALAZINE (APRESOLINE)** 10 MG Intravenous Q6HPRN SBP > 180 mmHg for 60 Days, Clinician Dir:IV PUSH FOR ADULTS PER IV GUIDELINE           -   ** ** ** **INSULIN REGULAR HUMAN (HUMULIN R)******** Sliding Scale Subcutaneous Q6H for 60 Days      For Glucose 80 to 150 Give 0 Units    For Glucose 151 to 200 Give 2 Units    For Glucose 201 to 250 Give 3 Units    For Glucose 251 to 300 Give 4 Units    Notify Physician if: Call HO for glucose > 300          -   **BISACODYL (DULCOLAX)** 10 MG Rectal QDAYPRN Constipation for 60 Days, Clinician Dir:NOT RELIEVED BY PO           -   **SODIUM CL 0.9% FOR INH (STOCK) (SODIUM CHLORIDE NEB(STOCK))** 3 ML Inhalation Q4H for 60 Days           -   **SENNA (SENOKOT)** 17.2 MG Oral QDAY for 60 Days           -   **ACETAMINOPHEN (TYLENOL)**  650 MG Oral Q6HPRN Pain, Fever, Clinician Dir:NOT TO EXCEED 4 G APAP PER DAY           -   **TAMSULOSIN (FLOMAX)** 0.4 MG Oral QHS for 60 Days           -   **ATORVASTATIN (LIPITOR)** 80 MG Oral QHS for 60 Days           -   **DORZOLAMIDE 2% OPHTH (TRUSOPT 2% OPHTH)** 1 DROP Both Eyes SOL BID for 60 Days           -   **ASPIRIN CHEWABLE** 81 MG Oral QDAY for 60 Days           -   **BRIMONIDINE 0.2% OPHTH (ALPHAGAN 0.2% OPHTH)** 1 DROP Both Eyes SOL TID for 60 Days           -   **IPRATROPIUM/ALBUTEROL SULFATE (DUONEB)** 3 ML Inhalation Q6H for 60 Days           -   **LACTULOSE (CEPHULAC)** 10 G Oral QHSPRN Constipation for 60 Days           -   **HEPARIN** 5000 UNITS  Subcutaneous Q8H First Dose Now for 60 Days           -   **BISACODYL E.C. (BISACODYL)** 10 MG Oral QDAYPRN Constipation for 60 Days           -   **BUDESONIDE INHALATION (PULMICORT RESPULES)** 1 MG Inhalation BID First Dose Now for 60 Days           Comment 38M w/ pmhx significant for HFrEF (BiV dysfunction with EF 20-25% and  severely reduced RV function 05/2019), COPD (not on home O2 and current tobacco  use), pulmonary nodules, HTN, Type II DM and Schizoaffective disorder, who  presented with shortness of breath and fevers 2/24 and was found to have  hypoxic and hypercapnic respiratory failure concerning for pneumonia,  decompensated heart failure, and COPD exacerbation. Transfered out of MICU  3/4.            #Acute hypoxic and hypercapneic respiratory failure    #Pneumonia    #Acute exacerbation of COPD (not on home O2)    #Pulmonary Nodules    H/o COPD (last PFTS (07/21/19) FEV1 24% with FEV1/FVC 40% GOLD IV), prior  admission 01/2020 for Pseudomonas pneumonia. Presented with 3 days of SOB,  fevers, and difficulty walking 2/2 DOE. Found to be febrile, with  leukocytosis, and CT revealing consolidative opacity with surrounding patchy  groundglass opacities involving the entire left lower lobe concerning for pna.  Likely overall acute hypoxic respiratory failure multifactorial in nature,  primary pneumonia (WBC, fever, imaging) with component of CHF exacerbation  (edematous, wt gain) and COPD exacerbation (wheezing on exam, mild respiratory  acidosis). Admitted to MICU d/t high flow requirement. Started on cefepime  (prior PSA) and azithromycin 2/24 - 2/27) and then placed on Meropenam for 7d  course. Sputum unable to be produce for cx. Diuresed w/ initial poor response.  Also started on Prednisone for COPD component (2/24 - 2/26) and restarted IV  solumederol due to worsening status once prednisone stopped. Urine  legionella/strep negative. Initial improvement, then with periods of worsening  hypercapnia and  somnolence. Managed with up-titration of BiPAP settings and  increased steroid dose. Weaned to NC 3/3.    - Titrate supplemental oxygen as tolerated for goal SpO2 88-92%; goal stay  off NIPPV if able, if rescue needed had been up to 22/7.    -  Continue Solumedrol 60mg  (started 3/2- ), wean q6h --> q12h-->q24hr -->PO  pred 40mg  3/5 with plan to taper.    - Continue standing Duonebs, Budesonide, mucolytics.    - Chest PT q4hr and Saline nebs q4hr.    - Follow-up chest CT in 6-8 weeks for pulmonary nodules.        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 Initial picture of likely volume overload leading to  acute hypoxic respiratory failure. EKG without ischemic findings, trop peaked  0.12 and down-trended. BNP 737 (down from 1085 01/2020 admit), weight up 84kg  (dry wt 75kg). Previous admits for CHF, unclear follow up (planned to f/u w/  Renne Crigler). At that time discharged on Toprol, last filled Lopressor 25mg   BID 04/2020, unclear current BB regimen ongoing vs d/c'd. Previously on  Aldactone, d/c'd d/t hyperkalemia. Planned to consider ACE-I/SGLT-2  outpatient.    TTE 2/25 noted known findings of BiV dysfunction (EF 20-25%), RV  moderately/severely dilated and severely reduced RV, RA dilation, elevated RA  pressure, RV overload mild MR, severe TR, trace AR, moderate PR. Aggressively  diuresed with Lasix infusion, good volume output, improved respiratory status  and edema. Have since held and UOP remains brisk without intervention.    - GDMT:    --- C/w metoprolol 6.25mg  q6h.    --- C/w captopril 6.25mg  tid.    - Hold on further diuresis, UOP adequate  without intervention; goal even to  net negative; will give Lasix 40mg  PO this morning.    - Hydralazine 50 mg TID.    - Would benefit from RHC +/- this admit to further evaluate ? PAH.    - Consider inpatient HF consult for GDMT optimization as patient did not  follow up outpatient.    - Continue home ASA and Atorvastatin.        #AKI    #Metabolic Alkaolsis    #Hyperkalemia - Resolved    #Hypernatremia    Worsening kidney function from baseline Cr 1.4. Peak Cr of 4.14 likely  secondary to cardiorenal syndrome. Cr slowly improving. Aggressively diuresed  (>9L negative) with Lasix / Diuril with improvement in respiratory status /  edema, also development of likely contraction metabolic alkalosis.  Intermittent Diamox dosing. Now with development of worsening hypernatremia.  Brisk UOP despite holding further diuresis likely represents post-ATN  autodiuresis.    - Hold on further diuresis today, goal even to net negative; diuresis per  above.    - Decrease FWF 300 Q4h. If worsening will need to consider D5W.        #Toxic Metabolic Encephelopathy - Improving    #Schizoaffective disorder    Reportedly baseline memory issues/confusion. Concern during course for  gradually worsening mental status in setting of increasing hypercarbia,  managed previously w/ BiPAP etc. Also d/t concern for aspiration made NPO and  several home maintenance meds were hold. Required Precedex in ICU. Now remains  intermittently delirious though improving. Likely some ICU delirium as well.    -Cont home Carbamazepine 400mg  qAM / 600mg  qHS.    -Cont home Mirtazapine 15mg  qHS.    -  Cont home Zyprexa 10mg  qHS.     -Cont home Seroquel 300mg  qHS.    -Daily EKG for QTc.        #Nutrition    #Constipation    Mental status worsening in setting of hypercarbia / delirium, made NPO after  SLP eval. Small-bore DHT placed 3/2.    - Nutrition consult, start TF per recs.    - SLP team to follow for readiness.    - Continue Colace; PRN Dulcolax if needed.         #T2DM    Last A1C 7.5 (01/2020)    - Holding home Metformin.    - SSI q6h.    - Start Lantus 4u qhs tonight given persistent hyperglycemia, especially on  steroids.            FULL CODE    NPO + TF via DHT    SQH  PPI stopped    Daughter: Hulda Humphrey: [phone]    Social: Pt lives in a group home.    **Assessment and Plan**    Medication              -   **POLYETHYLENE GLYCOL (MIRALAX)** 17 G Oral BID for 60 Days           -   **HYDRALAZINE (APRESOLINE)** 50 MG Oral TID for 59 Days           -   **METOPROLOL (LOPRESSOR)** 6.25 MG Oral QID for 60 Days           -   **CAPTOPRIL (CAPOTEN)** 6.25 MG Oral TID First Dose Now for 60 Days           -   **METHYLPREDNISOLONE (SOLU-MEDROL)** 60 MG Intravenous QDAY for 59 Days, Clinician Dir:IV PUSH OVER 3 -5 MINUTES           -   **MIRTAZAPINE (REMERON)** 15 MG Oral QHS for 60 Days           -   **QUETIAPINE FUMARATE (SEROQUEL)** 300 MG Oral QHS for 60 Days           -   **OLANZAPINE (ZYPREXA)** 10 MG Oral QHS for 60 Days           -   **CARBAMAZEPINE (TEGRETOL)** 600 MG Orogastric Tube QPM for 60 Days           -   **GUAIFENESIN SYRUP** 200 MG Oral Q4H First Dose Now           -   **CARBAMAZEPINE (TEGRETOL)** 400 MG Orogastric Tube QAM for 60 Days           -   **DOCUSATE SODIUM** 100 MG Nasogastric BID for 60 Days           -   **HYDRALAZINE (APRESOLINE)** 10 MG Intravenous Q6HPRN SBP > 180 mmHg for 60 Days, Clinician Dir:IV PUSH FOR ADULTS PER IV GUIDELINE           -   ** ** ** **INSULIN REGULAR HUMAN (HUMULIN R)******** Sliding Scale Subcutaneous Q6H for 60 Days      For Glucose 80 to 150 Give 0 Units    For Glucose 151 to 200 Give 2 Units    For Glucose 201 to 250 Give 3 Units    For Glucose 251 to 300 Give 4 Units    Notify Physician if: Call HO for glucose > 300          -   **BISACODYL (DULCOLAX)**  10 MG Rectal QDAYPRN Constipation for 60 Days, Clinician Dir:NOT RELIEVED BY PO           -   **SODIUM CL 0.9% FOR INH (STOCK) (SODIUM  CHLORIDE NEB(STOCK))** 3 ML Inhalation Q4H for 60 Days           -   **SENNA (SENOKOT)** 17.2 MG Oral QDAY for 60 Days           -   **ACETAMINOPHEN (TYLENOL)** 650 MG Oral Q6HPRN Pain, Fever, Clinician Dir:NOT TO EXCEED 4 G APAP PER DAY           -   **TAMSULOSIN (FLOMAX)** 0.4 MG Oral QHS for 60 Days           -   **ATORVASTATIN (LIPITOR)** 80 MG Oral QHS for 60 Days           -   **DORZOLAMIDE 2% OPHTH (TRUSOPT 2% OPHTH)** 1 DROP Both Eyes SOL BID for 60 Days           -   **ASPIRIN CHEWABLE** 81 MG Oral QDAY for 60 Days           -   **BRIMONIDINE 0.2% OPHTH (ALPHAGAN 0.2% OPHTH)** 1 DROP Both Eyes SOL TID for 60 Days           -   **IPRATROPIUM/ALBUTEROL SULFATE (DUONEB)** 3 ML Inhalation Q6H for 60 Days           -   **LACTULOSE (CEPHULAC)** 10 G Oral QHSPRN Constipation for 60 Days           -   **HEPARIN** 5000 UNITS Subcutaneous Q8H First Dose Now for 60 Days           -   **BISACODYL E.C. (BISACODYL)** 10 MG Oral QDAYPRN Constipation for 60 Days           -   **BUDESONIDE INHALATION (PULMICORT RESPULES)** 1 MG Inhalation BID First Dose Now for 60 Days           Comment 54M w/ pmhx significant for HFrEF (BiV dysfunction with EF 20-25% and  severely reduced RV function 05/2019), COPD (not on home O2 and current tobacco  use), pulmonary nodules, HTN, Type II DM and Schizoaffective disorder, who  presented with shortness of breath and fevers 2/24 and was found to have  hypoxic and hypercapnic respiratory failure concerning for pneumonia,  decompensated heart failure, and COPD exacerbation. Transfered out of MICU  3/4.            #Acute hypoxic and hypercapneic respiratory failure    #Pneumonia    #Acute exacerbation of COPD (not on home O2)    #Pulmonary Nodules    H/o COPD (last PFTS (07/21/19) FEV1 24% with FEV1/FVC 40% GOLD IV), prior  admission 01/2020 for Pseudomonas pneumonia. Presented with 3 days of SOB,  fevers, and difficulty walking 2/2 DOE. Found to be febrile,  with  leukocytosis, and CT revealing consolidative opacity with surrounding patchy  groundglass opacities involving the entire left lower lobe concerning for pna.  Likely overall acute hypoxic respiratory failure multifactorial in nature,  primary pneumonia (WBC, fever, imaging) with component of CHF exacerbation  (edematous, wt gain) and COPD exacerbation (wheezing on exam, mild respiratory  acidosis). Admitted to MICU d/t high flow requirement. Started on cefepime  (prior PSA) and azithromycin 2/24 - 2/27) and then placed on Meropenam for 7d  course. Sputum unable to be produce for cx. Diuresed w/ initial poor response.  Also started  on Prednisone for COPD component (2/24 - 2/26) and restarted IV  solumederol due to worsening status once prednisone stopped. Urine  legionella/strep negative. Initial improvement, then with periods of worsening  hypercapnia and somnolence. Managed with up-titration of BiPAP settings and  increased steroid dose. Weaned to NC 3/3.    - Titrate supplemental oxygen as tolerated for goal SpO2 88-92%; goal stay  off NIPPV if able, if rescue needed had been up to 22/7.    - Continue Solumedrol 60mg  (started 3/2- ), wean q6h --> q12h-->q24hr -->PO  pred 40mg  3/5 with plan to taper.    - Continue standing Duonebs, Budesonide, mucolytics.    - Chest PT q4hr and Saline nebs q4hr.    - Follow-up chest CT in 6-8 weeks for pulmonary nodules.        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 Initial picture of likely volume overload leading to  acute hypoxic respiratory failure. EKG without ischemic findings, trop peaked  0.12 and  down-trended. BNP 737 (down from 1085 01/2020 admit), weight up 84kg  (dry wt 75kg). Previous admits for CHF, unclear follow up (planned to f/u w/  Renne Crigler). At that time discharged on Toprol, last filled Lopressor 25mg   BID 04/2020, unclear current BB regimen ongoing vs d/c'd. Previously on  Aldactone, d/c'd d/t hyperkalemia. Planned to consider ACE-I/SGLT-2  outpatient.    TTE 2/25 noted known findings of BiV dysfunction (EF 20-25%), RV  moderately/severely dilated and severely reduced RV, RA dilation, elevated RA  pressure, RV overload mild MR, severe TR, trace AR, moderate PR. Aggressively  diuresed with Lasix infusion, good volume output, improved respiratory status  and edema. Have since held and UOP remains brisk without intervention.    - GDMT:    --- C/w metoprolol 6.25mg  q6h.    --- C/w captopril 6.25mg  tid.    - Hold on further diuresis, UOP adequate without intervention; goal even to  net negative; will give Lasix 40mg  PO this morning.    - Hydralazine 50 mg TID.    - Would benefit from RHC +/- this admit to further evaluate ? PAH.    - Consider inpatient HF consult for GDMT optimization as patient did not  follow up outpatient.    - Continue home ASA and Atorvastatin.        #AKI    #Metabolic Alkaolsis    #Hyperkalemia - Resolved    #Hypernatremia    Worsening kidney function from baseline Cr 1.4. Peak Cr of 4.14 likely  secondary to cardiorenal syndrome. Cr slowly improving. Aggressively diuresed  (>9L negative) with Lasix / Diuril with improvement in respiratory status /  edema, also development of likely contraction metabolic alkalosis.  Intermittent Diamox dosing. Now with development of worsening hypernatremia.  Brisk UOP despite holding further diuresis likely represents post-ATN  autodiuresis.    - Hold on further diuresis today, goal even to net negative; diuresis per  above.    - Decrease FWF 300 Q4h. If worsening will need to consider D5W.        #Toxic Metabolic Encephelopathy -  Improving    #  Schizoaffective disorder    Reportedly baseline memory issues/confusion. Concern during course for  gradually worsening mental status in setting of increasing hypercarbia,  managed previously w/ BiPAP etc. Also d/t concern for aspiration made NPO and  several home maintenance meds were hold. Required Precedex in ICU. Now remains  intermittently delirious though improving. Likely some ICU delirium as well.    -Cont home Carbamazepine 400mg  qAM / 600mg  qHS.    -Cont home Mirtazapine 15mg  qHS.    -Cont home Zyprexa 10mg  qHS.     -Cont home Seroquel 300mg  qHS.    -Daily EKG for QTc.        #Nutrition    #Constipation    Mental status worsening in setting of hypercarbia / delirium, made NPO after  SLP eval. Small-bore DHT placed 3/2.    - Nutrition consult, start TF per recs.    - SLP team to follow for readiness.    - Continue Colace; PRN Dulcolax if needed.        #T2DM    Last A1C 7.5 (01/2020)    - Holding home Metformin.    - SSI q6h.    - Start Lantus 4u qhs tonight given persistent hyperglycemia, especially on  steroids.            FULL CODE    NPO + TF via DHT    SQH  PPI stopped    Daughter: Hulda Humphrey: [phone]    Social: Pt lives in a group home.            **Electronically signed by Arnetha Gula, MD on 07/13/2020 19:44**        **Electronically signed by Arnetha Gula, MD on 07/13/2020 19:44**

## 2020-07-04 NOTE — Progress Notes (Signed)
 **Subjective/Review of Systems (Hyperglycemia)**    Subjectiv/Review of Systems S - no issues, d/c home today        Diet - cleared for regular diet; TF discontinued        BG data reviewed the last 24 hours. Blood sugars elevated in the 200s  throughout the day. .    **Diabetes History**    Type of DM Type 2 (Diabetes).    Current Diabetes Regimen NPH 8 units QAM    regular insulin low dose correction q6h.    Outpatient Diabetes Regimen Glucophage 1000mg  BID.    Current Diet Diet.      07/14/2020 06:21              -  Weight: 67.9kg          -  Height: 157.48cm          -  BMI: 27.38          -  BSA: 1.69          -  IBW kg: 54.71            Medication              -   **CARBAMAZEPINE (TEGRETOL)** 600 MG Oral QPM for 55 Days   Pending Date: 07/16/2020          -   **CARBAMAZEPINE (TEGRETOL)** 400 MG Oral QAM for 55 Days   Pending Date: 07/16/2020          -   **METOPROLOL (LOPRESSOR)** 25 MG Oral BID for 60 Days   Pending Date: 07/16/2020          -   **INSULIN NPH HUMAN (HUMULIN N)** 8 UNITS Subcutaneous QAM for 60 Days, Clinician RSW:NIOE STEROID ADMINISTRATION   Pending Date: 07/16/2020          -   **PREDNISONE (DELTASONE)** 30 MG Oral QDAY for 60 Days   Pending Date: 07/16/2020          -   **DOCUSATE SODIUM (COLACE)** 100 MG Oral BID for 60 Days, Clinician VOJ:[id number] TO ORDER 112           -   **FUROSEMIDE (LASIX)** 40 MG Oral QDAY First Dose Now for 60 Days           -   **POLYETHYLENE GLYCOL (MIRALAX)** 17 G Oral BID for 60 Days           -   **OLANZAPINE (ZYPREXA)** 10 MG Oral QHS for 60 Days           -   **MIRTAZAPINE (REMERON)** 15 MG Oral QHS for 60 Days           -   **QUETIAPINE FUMARATE (SEROQUEL)** 300 MG Oral QHS for 60 Days           -   **GUAIFENESIN SYRUP** 200 MG Oral Q4H First Dose Now           -   **HYDRALAZINE (APRESOLINE)** 10 MG Intravenous Q6HPRN SBP > 180 mmHg for 60 Days, Clinician Dir:IV PUSH FOR ADULTS PER IV GUIDELINE           -   ** ** **  **INSULIN REGULAR HUMAN (HUMULIN R)******** Sliding Scale Subcutaneous Q6H for 60 Days      For Glucose 80 to 150 Give 0 Units    For Glucose 151 to 200 Give 2 Units    For Glucose 201 to 250 Give 3 Units  For Glucose 251 to 300 Give 4 Units    Notify Physician if: Call HO for glucose > 300          -   **BISACODYL (DULCOLAX)** 10 MG Rectal QDAYPRN Constipation for 60 Days, Clinician Dir:NOT RELIEVED BY PO           -   **SODIUM CL 0.9% FOR INH (STOCK) (SODIUM CHLORIDE NEB(STOCK))** 3 ML Inhalation Q4H for 60 Days           -   **SENNA (SENOKOT)** 17.2 MG Oral QDAY for 60 Days           -   **ACETAMINOPHEN (TYLENOL)** 650 MG Oral Q6HPRN Pain, Fever, Clinician Dir:NOT TO EXCEED 4 G APAP PER DAY           -   **ATORVASTATIN (LIPITOR)** 80 MG Oral QHS for 60 Days           -   **TAMSULOSIN (FLOMAX)** 0.4 MG Oral QHS for 60 Days           -   **DORZOLAMIDE 2% OPHTH (TRUSOPT 2% OPHTH)** 1 DROP Both Eyes SOL BID for 60 Days           -   **ASPIRIN CHEWABLE** 81 MG Oral QDAY for 60 Days           -   **BRIMONIDINE 0.2% OPHTH (ALPHAGAN 0.2% OPHTH)** 1 DROP Both Eyes SOL TID for 60 Days           -   **IPRATROPIUM/ALBUTEROL SULFATE (DUONEB)** 3 ML Inhalation Q6H for 60 Days           -   **LACTULOSE (CEPHULAC)** 10 G Oral QHSPRN Constipation for 60 Days           -   **HEPARIN** 5000 UNITS Subcutaneous Q8H First Dose Now for 60 Days           -   **BISACODYL E.C. (BISACODYL)** 10 MG Oral QDAYPRN Constipation for 60 Days           -   **BUDESONIDE INHALATION (PULMICORT RESPULES)** 1 MG Inhalation BID First Dose Now for 60 Days         **ROS**    ROS A complete 10 systems review was done and all were negative except for  those documented in the history above.    **Exam**    Hyperglycemia Labs    07/16/2020 06:12              -  Creatinine (CR): 2.02 Hmg/dL      59/56/3875 64:33              -  Creatinine (CR): 2.13 Hmg/dL            Vital Signs    07/16/2020 03:58              -  Morse Fall  Risk Total: 35      07/15/2020 20:11              -  Temperature: 36.8 (35-37.8Cel)          -  Site: Oral          -  Heart Rate: 84 (60-90)          -  Site: Monitor          -  BP: 104/76 (90-140/60-90)          -  Site: Right Arm          -  Position: Sitting          -  Method: Automated          -  Respirations: 18 (12-20)          -  O2 Saturation (%): 94          -  O2 Delivery Method: Nasal Cannula          -  MEWS Vital Sign Score: 0      CFS Vital Signs CS    07/16/2020 06:59              -  Shift Intake Total: ~28ml          -  Shift Output Total:          -  Shift Balance: ~-826ml          GEN GEN NAD.    HEENT HEENT neck supple.    Lungs Lungs Clear Bilateral.    Heart Heart S1 & S2 normal.    Abdomen Abdomen SNTTP, Abdomen +bowel sounds x4.    Extremities Extremities no tremor in hands bilaterally, no clubbing, cyanosis  or edema.    Neurology Neurology normal motor in UE, normal sensory.    Derm/Skin Derm/Skin no striae noted, no skin dryness.    Psych Psych no anxiety, no depression.    **Assessment and Plan**    Assessment 89M w/ pmhx significant for HFrEF (BiV dysfunction with EF 20-25%  and severely reduced RV function 05/2019), COPD (not on home O2 and current  tobacco use), pulmonary nodules, HTN, Type 2 DM and Schizoaffective disorder,  who presented with shortness of breath and fevers 2/24 and was found to have  hypoxic and hypercapnic respiratory failure concerning for pneumonia,  decompensated heart failure, and COPD exacerbation. We are consulted for  further blood sugar management. .    Plan    **1.   Type 2 diabetes**    Patient is on a Prednisone taper for treatment of COPD exacerbation. He is  receiving 30mg  today. Switched from tube feeds to regular diet yesterday.  Patient had elevated blood sugars up to the 200s throughout the day yesterday.  Will start him on prandial insulin.        _RECOMMENDATIONS:_    -Continue NPH 8 units daily with steroid  administration (first dose 3/8; hold if steroid held)    - **Start** lispro 4 units tidwm    - **Start** lispro medium correction tid    - **Discontinue** regular insulin low dose correction q6h while tube feeds are running         Directions For any questions about glycemia management, please contact the  hyperglycemia service pager [phone]) from 8am - 5pm M-F. Other times contact  the on-call endocrinology fellow.    **Discharge**    Please bring glucose meter to every visit Please bring glucose meter to every  visit.    If you have any questions or concerns about follow up, please contact the  endocrine clinic in [place] [facility] If you have any questions or concerns about  follow up, please contact the endocrine in [place] [address] at [address],  [place] at [phone].    Discharge Instructions (Free Text)    1.  Type 2 diabetes    -This patient's A1c is unknown, was taking Metformin at home. Patient's blood sugars are checked by his group home.     -This patient should check BG at home 1-2 times per day  _- Given BG readings here in the hospital and given A1c value, I would  recommend this patient be discharged on the following :_    **\--> Metformin 1000mg  BID**        ***Prescriptions needed by primary team:**    _None_                **Electronically signed by Irving Burton on 07/16/2020 09:09**

## 2020-07-04 NOTE — Progress Notes (Signed)
 **Subjective**    Subjective Overnight:    - Had a very brief desaturation event (60s) while sleeping that improved  spontaneously. Suspect apnea/hypopnea.    - Remains with excellent urine output (-8 liters!) despite the diuretics  being off.        Interval:    - Appears to be a little bit more confused this morning, but follows  commands.    **Subjective**    Subjective Overnight:    - Had a very brief desaturation event (60s) while sleeping that improved  spontaneously. Suspect apnea/hypopnea.    - Remains with excellent urine output (-8 liters!) despite the diuretics  being off.        Interval:    - Appears to be a little bit more confused this morning, but follows  commands.    **Vital Signs**    Vital Signs    07/09/2020 01:40              -  O2 Amount: 40%      07/08/2020 22:45              -  Morse Fall Risk Total: 50      CFS Vital Signs CS    07/09/2020 06:59              -  Shift Intake Total:          -  Shift Output Total:          -  Shift Balance: -      07/09/2020 06:10              -  Pain: 0          -  Riker: 5 - Agitated          -  Heart Rate: 97          -  Cardiac Rhythm: NSR - Normal Sinus Rhythm          -  BP Systolic: 129          -  BP Diastolic: 74          -  MAP: 89          -  BP Site: Left Arm          -  BP Position: Lying          -  BP Source: NIBP          -  Respirations: 16          -  SPO2 %: 91 (89-100)          -  Mode: Hi Flow          -  FiO2: 40%, 50L          -  Call Bell Within Reach: Yes          -  High Fall Risk Interventions: Yes          -  Repositioned Side: Right          -  HOB Elevation: 30          -  Comment: P&V on bed while getting nebs          -  IV Infusion 1: Normal Saline (KVO/Meds)          -  IV Dose 1: 5 ml/hr          -  ml/hour 1: 5 ml/hr          -  Medication Name 1: Potassium Chloride          -  Medication Dose 1:          -  Medication Freq 1: One  Time Only      07/09/2020 04:00              -  Temp Calc: 36.80          -  Temp Site: Oral          -  Foley care per protocol: Yes      07/09/2020 01:27              -  Respiratory Comments: pt acutely desatted, RN and RT in room, HFNC placed on 100% briefly, RT increased to 50L      07/08/2020 20:00              -  Oral Hygiene Q4: Yes          -  IV Infusion 2: furosemide          -  IV Dose 2: on hold          -  ml/hour 2: on hold          -  IV Infusion 3: Dexmedetomidine 421mcg/100ml          -  IV Dose 3: on hold          -  ml/hour 3: on hold      07/08/2020 02:00              -  EtCO2: 94        **Vital Signs**    Vital Signs    07/09/2020 01:40              -  O2 Amount: 40%      07/08/2020 22:45              -  Morse Fall Risk Total: 50      CFS Vital Signs CS    07/09/2020 06:59              -  Shift Intake Total:          -  Shift Output Total:          -  Shift Balance: -      07/09/2020 06:10              -  Pain: 0          -  Riker: 5 - Agitated          -  Heart Rate: 97          -  Cardiac Rhythm: NSR - Normal Sinus Rhythm          -  BP Systolic: 129          -  BP Diastolic: 74          -  MAP: 89          -  BP Site: Left Arm          -  BP Position: Lying          -  BP Source: NIBP          -  Respirations: 16          -  SPO2 %: 91 (89-100)          -  Mode: Hi  Flow          -  FiO2: 40%, 50L          -  Call Bell Within Reach: Yes          -  High Fall Risk Interventions: Yes          -  Repositioned Side: Right          -  HOB Elevation: 30          -  Comment: P&V on bed while getting nebs          -  IV Infusion 1: Normal Saline (KVO/Meds)          -  IV Dose 1: 5 ml/hr          -  ml/hour 1: 5 ml/hr          -  Medication Name 1: Potassium Chloride          -  Medication Dose 1:          -  Medication Freq 1: One Time Only      07/09/2020 04:00              -  Temp  Calc: 36.80          -  Temp Site: Oral          -  Foley care per protocol: Yes      07/09/2020 01:27              -  Respiratory Comments: pt acutely desatted, RN and RT in room, HFNC placed on 100% briefly, RT increased to 50L      07/08/2020 20:00              -  Oral Hygiene Q4: Yes          -  IV Infusion 2: furosemide          -  IV Dose 2: on hold          -  ml/hour 2: on hold          -  IV Infusion 3: Dexmedetomidine 416mcg/100ml          -  IV Dose 3: on hold          -  ml/hour 3: on hold      07/08/2020 02:00              -  EtCO2: 94        **General**    Comment General: Awake in no acute distress with HFNC, following commands but  not orientating questions    CV: RRR with no m/r/g    Pulm: Rhonchi anteriorally b/l, no wheezes    GI: soft, NT/NT    Extremities: 1+ b/l lower extremity edema, wwp, more wrinkles than before    Skin: no rashes.    **General**    Comment General: Awake in no acute distress with HFNC, following commands but  not orientating questions    CV: RRR with no m/r/g    Pulm: Rhonchi anteriorally b/l, no wheezes    GI: soft, NT/NT    Extremities: 1+ b/l lower extremity edema, wwp, more wrinkles than before    Skin: no rashes.    **Assessment and Plan**    Medication              -   **KCL PREMIX 10 MEQ IN 100 ML (POTASSIUM  CHLORIDE 10 MEQ)** 10 MEQ Intravenous ONE TIME for 1 Doses, Clinician DDU:KGUR 4   Pending Date: 07/09/2020          -   **KCL PREMIX 10 MEQ IN 100 ML (POTASSIUM CHLORIDE 10 MEQ)** 10 MEQ Intravenous ONE TIME for 1 Doses, Clinician KYH:CWCB 3   Pending Date: 07/09/2020          -   **KCL PREMIX 10 MEQ IN 100 ML (POTASSIUM CHLORIDE 10 MEQ)** 10 MEQ Intravenous ONE TIME for 1 Doses, Clinician JSE:GBTD 2   Pending Date: 07/09/2020          -   **KCL PREMIX 10 MEQ IN 100 ML (POTASSIUM CHLORIDE 10 MEQ)** 10 MEQ Intravenous ONE TIME for 1 Doses, Clinician VVO:HYWV 1           -   **BISACODYL (DULCOLAX)** 10 MG Rectal QDAYPRN Constipation  for 60 Days, Clinician Dir:NOT RELIEVED BY PO           -   **DEXMEDETOMIDINE 400 MCG/100 ML (PRECEDEX PREMIX)** CONTINUOUS Intravenous INFUSION for 60 Days, Clinician PXT:GGYIRSW TO RIKER SAS 3-4 START AT 0.2 MCG/KG/HR. MAY INCREASE BY 0.1 MCG/KG/HR Q15MIN TO A MAX OF 1.5 MCG/KG/HR.           -   **MEROPENEM (MERREM)** 500 MG Intravenous Q12H First Dose Now for 60 Days, Clinician NIO:EVOJJK TO 100 ML NS MED PLUS. INFUSE FIRST DOSE OVER 30 MINUTES AND ALL SUBSEQUENT DOSES OVER 3 HOURS.           -   **SODIUM CL 0.9% FOR INH (STOCK) (SODIUM CHLORIDE NEB(STOCK))** 3 ML Inhalation Q4H for 60 Days           -   **SENNA (SENOKOT)** 17.2 MG Oral QDAY for 60 Days           -   **POLYETHYLENE GLYCOL (MIRALAX)** 17 G Oral QDAY First Dose Now for 60 Days           -   **OLANZAPINE (ZYPREXA)** 10 MG Oral QHS STAT and then Routine for 60 Days, Clinician Dir:MAX DAILY DOSE: 20 MG/DAY           -   **ACETAMINOPHEN (TYLENOL)** 650 MG Oral Q6HPRN Pain, Fever, Clinician Dir:NOT TO EXCEED 4 G APAP PER DAY           -   **PANTOPRAZOLE (PROTONIX)** 40 MG Intravenous QDAY First Dose Now for 60 Days           -   **ATORVASTATIN (LIPITOR)** 80 MG Oral QHS for 60 Days           -   **TAMSULOSIN (FLOMAX)** 0.4 MG Oral QHS for 60 Days           -   **BRIMONIDINE 0.2% OPHTH (ALPHAGAN 0.2% OPHTH)** 1 DROP Both Eyes SOL TID for 60 Days           -   **DORZOLAMIDE 2% OPHTH (TRUSOPT 2% OPHTH)** 1 DROP Both Eyes SOL BID for 60 Days           -   **CARBAMAZEPINE (TEGRETOL)** 400 MG Oral QAM for 60 Days           -   **DOCUSATE SODIUM (COLACE)** 100 MG Oral BID for 60 Days           -   **ASPIRIN CHEWABLE** 81 MG Oral QDAY for 60 Days           -   ** ** ** **  INSULIN LISPRO (HUMALOG) (HUMALOG)******** Sliding Scale Subcutaneous TIDWM for 60 Days      For Glucose 80 to 150 Give 0 Units    For Glucose 151 to 200 Give 2 Units    For Glucose 201 to 250 Give 3 Units    For Glucose 251 to 300 Give 4 Units    Notify  Physician if: Call HO for glucose > 300          -   **IPRATROPIUM/ALBUTEROL SULFATE (DUONEB)** 3 ML Inhalation Q6H for 60 Days           -   **GUAIFENESIN SYRUP** 200 MG Oral Q4H First Dose Now for 60 Days           -   **MIRTAZAPINE (REMERON)** 15 MG Oral QHS First Dose Now for 60 Days           -   **QUETIAPINE FUMARATE (SEROQUEL)** 400 MG Oral QHS First Dose Now for 60 Days           -   **CARBAMAZEPINE (TEGRETOL)** 600 MG Oral QPM First Dose Now for 60 Days           -   **LACTULOSE (CEPHULAC)** 10 G Oral QHSPRN Constipation for 60 Days           -   **HEPARIN** 5000 UNITS Subcutaneous Q8H First Dose Now for 60 Days           -   **BISACODYL E.C. (BISACODYL)** 10 MG Oral QDAYPRN Constipation for 60 Days           -   **BUDESONIDE INHALATION (PULMICORT RESPULES)** 1 MG Inhalation BID First Dose Now for 60 Days           Comment 55 yo male with history of HFrEF (BiV dysfunction, EF 20-25% 05/2019),  COPD not on home O2, pulmonary nodules, HTN, DM, and Schizoaffective disorder  who presented for shortness of breath, found to be febrile and to have hypoxic  respiratory failure concerning for primary pneumonia, component of  decompensated heart failure / COPD exacerbation.        --NEURO--    # Toxic Metabolis Encephelopathy    Patient with worsening mental status 2/26 with a lititbe bit of confusion on  3/1; unclear if far from baseline. ABG with pCO2 60, largely unchanged. Ddx  HFrEF exacerbation versus sepsis versus uremia versus missed po psychiatric  medications. Has also been noted to have elevated ammonia levels in past.  Currently able to protect airway    - Will continue to monitor mental status to determine if needs intubation for  airway protection    - s/p precedex        #Schizoaffective disorder    Alert, partially oriented, reports baseline memory issues / confusion. On 2/26  concern for aspiration made NPO. Has been unable to receive psych meds since  2/27 given patient removed  dobhoff and meds unable to be converted to IV    -Cont home Carbamazepine 400 mg qAM / 600mg  qHS    -Cont home Mirtazapine 15 mg qHS    -Cont home Zyprexa 10 mg qHS    -Cont home Seroquel 300 mg qHS    --- Will have SLP re-evaluate with plan to replace dobhoff for med  administration if does not pass SLP        --PULM/ID--    #Acute hypoxic respiratory failure    #Pneumonia    #Acute exacerbation of COPD (not on home  O2)    H/o COPD (last PFTS (07/21/19) FEV1 24% with FEV1/FVC 40% GOLD IV), prior  admission 01/2020 for pseudomonas pneumonia. Presented with 3 days of SOB,  fevers, and difficulty walking due / DOE. Found febrile, with leukocytosis,  and CT revealing consolidative opacity with surrounding patchy groundglass  opacities involving the entire left lower lobe concerning for pneumonia.  Likely overall acute hypoxic respiratory failure multifactorial in nature,  primary pneumonia (WBC, fever, imaging) with component of CHF exacerbation  (edematous, wt up) / COPD component (wheezing on exam, mild respiratory  acidosis). Admitted to MICU d/t high flow requirement. Started on abx per  below. Diuresed w/ initial poor response. Urine legionella / strep negative.  Given lack of wheezing on exam today, concern higher for CHF and pneumonia as  contributing to acute hypoxic resp failure        Dx:    - Blood cultures 2/24 NGTD    - Unable to produce sputum for sputum cx    Tx:    - Cefepime (2/24-2/26) transitioned to Meropenem (2/27-) given altered mental  status    --- Plan for 7 day course to complete 3/3    --- S/p ceftazidime (2/27)    - Vanc d/c 2/27 given MRSA swab negative    - Continue standing Duonebs, Budesonide, mucolytics    - Chest PT q4hr and Saline nebs q4hr    - S/p Azithromycin 500mg  daily (2/24-2/27), Prednisone 40mg /60mg  (2/24-2/26)  for COPD exacerbation    - Continue NPO given tenuous respiratory status; try to advance diet pending  SLP    - Continue HFNC, wean FiO2 as tolerated for goal SpO2>92%  (currently at 40%  50L in AM)        --CV--    #Acute on chronic systolic heart failure (LVEF 20-25% 01/2020)    #BiV dysfunction (Severely Reduced RV function)    #Type II NSTEMI 2/2 Demand Ischemia    #HLD    Known h/o systolic HF - last TTE (01/2020) w/ LVEF 20-25%, global LV  hypokinesis, RV dilation with severely reduced systolic function, 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 Initial picture of likely volume overload leading to  acute hypoxic respiratory failure. EKG without ischemic findings, trop 0.12.  BNP 737 (down from 1085 01/2020 admit), weight up 84kg (dry wt 75kg). Previous  admits for CHF, unclear follow up (planned to f/u w/ Renne Crigler). At that  time discharged on Toprol, last filled Lopressor 25mg  BID 04/2020, unclear  current BB regimen ongoing vs d/c'd. Previously on Aldactone, d/c'd d/t  hyperkalemia. Planned to consider ACE-I / SGLT-2 outpatient. Diuresed in ED w/  IV Lasix 120mg  / 200mg  with poor reported output, and subsequent hypotension.  Received Albumin x1.    - TTE (2/25) w BiV dysfunction (EF 20-25%); RV moderately/severely dilated  and severely reduced RV; RA dilation, elevated RA pressure, RV overload mild  MR, severe TR, trace AR, moderate PR    - Trop 0.12 -->0.08    Tx:    - HOLD Diuretics as -8L overnight 2/28    - s/p lasix gtt at 40    -Holding home Hydralazine 50mg  TID given hypotension    -Would benefit from RHC +/- this admit to further evaluate ? PAH    -Consider inpatient HF consult for GDMT initation and optimization as patient did not follow up outpatient    -Continue home ASA and Atorvastatin         --  RENAL--    #AKI    #Hyperkalemia - Resolved    Patient with worsening kidney function from baseline 1.4 with peak of 4.14  probable prerenal from cardiorenal. Urine output improved after starting lasix  gtt    - Renal C/s    - HOLD lasix gtt given -8L    - s/p lasix gtt at 40 with diuril bid         # Anion Gap Metabolic Acidosis- Likely starvation ketosis    AG 17 today without concomitant acidosis (CO2 26). No recent hypotension  suggestive of lactic acidosis and infection well controlled without recent  fevers or worsening secretions. Had not been able to maintain enteral access  for feeds and mental status precluding oral intake, so probable starvation  ketosis    - elevated BHB to 1.40, lactate 0.8    - Improving on 3/1        --GI--    #Nutrition    - Currently NPO    --- Unable to sustain enteral access with acute delirium, as above    --- SLP re-evaluation today        #Constipation    - Give dulcolax suppository today if unable to get enteral access for oral  meds        --ENDO--    #T2DM - Last A1C 7.5 01/2020    -Holding home metformin    -ISS        --HEME--    H/H stable, no active issues.        Full code (confirmed)    NPO (Will have SLP eval with plan to start oral feeds to tube feeds today)    SQ Heparin, PPI    Daughter: Hulda Humphrey: [phone]    Social: Pt lives in a group home.    **Assessment and Plan**    Medication              -   **KCL PREMIX 10 MEQ IN 100 ML (POTASSIUM CHLORIDE 10 MEQ)** 10 MEQ Intravenous ONE TIME for 1 Doses, Clinician GNF:AOZH 4   Pending Date: 07/09/2020          -   **KCL PREMIX 10 MEQ IN 100 ML (POTASSIUM CHLORIDE 10 MEQ)** 10 MEQ Intravenous ONE TIME for 1 Doses, Clinician YQM:VHQI 3   Pending Date: 07/09/2020          -   **KCL PREMIX 10 MEQ IN 100 ML (POTASSIUM CHLORIDE 10 MEQ)** 10 MEQ Intravenous ONE TIME for 1 Doses, Clinician ONG:EXBM 2   Pending Date: 07/09/2020          -   **KCL PREMIX 10 MEQ IN 100 ML (POTASSIUM CHLORIDE 10 MEQ)** 10 MEQ Intravenous ONE TIME for 1 Doses, Clinician WUX:LKGM 1           -   **BISACODYL (DULCOLAX)** 10 MG Rectal QDAYPRN Constipation for 60 Days, Clinician Dir:NOT RELIEVED BY PO           -   **DEXMEDETOMIDINE 400 MCG/100 ML (PRECEDEX PREMIX)** CONTINUOUS Intravenous INFUSION for 60 Days, Clinician  WNU:[id number] TO RIKER SAS 3-4 START AT 0.2 MCG/KG/HR. MAY INCREASE BY 0.1 MCG/KG/HR Q15MIN TO A MAX OF 1.5 MCG/KG/HR.           -   **MEROPENEM (MERREM)** 500 MG Intravenous Q12H First Dose Now for 60 Days, Clinician YQI:HKVQQV TO 100 ML NS MED PLUS. INFUSE FIRST DOSE OVER 30 MINUTES AND ALL SUBSEQUENT DOSES OVER 3 HOURS.           -   **  SODIUM CL 0.9% FOR INH (STOCK) (SODIUM CHLORIDE NEB(STOCK))** 3 ML Inhalation Q4H for 60 Days           -   **SENNA (SENOKOT)** 17.2 MG Oral QDAY for 60 Days           -   **POLYETHYLENE GLYCOL (MIRALAX)** 17 G Oral QDAY First Dose Now for 60 Days           -   **OLANZAPINE (ZYPREXA)** 10 MG Oral QHS STAT and then Routine for 60 Days, Clinician Dir:MAX DAILY DOSE: 20 MG/DAY           -   **ACETAMINOPHEN (TYLENOL)** 650 MG Oral Q6HPRN Pain, Fever, Clinician Dir:NOT TO EXCEED 4 G APAP PER DAY           -   **PANTOPRAZOLE (PROTONIX)** 40 MG Intravenous QDAY First Dose Now for 60 Days           -   **ATORVASTATIN (LIPITOR)** 80 MG Oral QHS for 60 Days           -   **TAMSULOSIN (FLOMAX)** 0.4 MG Oral QHS for 60 Days           -   **BRIMONIDINE 0.2% OPHTH (ALPHAGAN 0.2% OPHTH)** 1 DROP Both Eyes SOL TID for 60 Days           -   **DORZOLAMIDE 2% OPHTH (TRUSOPT 2% OPHTH)** 1 DROP Both Eyes SOL BID for 60 Days           -   **CARBAMAZEPINE (TEGRETOL)** 400 MG Oral QAM for 60 Days           -   **DOCUSATE SODIUM (COLACE)** 100 MG Oral BID for 60 Days           -   **ASPIRIN CHEWABLE** 81 MG Oral QDAY for 60 Days           -   ** ** ** **INSULIN LISPRO (HUMALOG) (HUMALOG)******** Sliding Scale Subcutaneous TIDWM for 60 Days      For Glucose 80 to 150 Give 0 Units    For Glucose 151 to 200 Give 2 Units    For Glucose 201 to 250 Give 3 Units    For Glucose 251 to 300 Give 4 Units    Notify Physician if: Call HO for glucose > 300          -   **IPRATROPIUM/ALBUTEROL SULFATE (DUONEB)** 3 ML Inhalation Q6H for 60 Days           -   **GUAIFENESIN SYRUP** 200 MG  Oral Q4H First Dose Now for 60 Days           -   **MIRTAZAPINE (REMERON)** 15 MG Oral QHS First Dose Now for 60 Days           -   **QUETIAPINE FUMARATE (SEROQUEL)** 400 MG Oral QHS First Dose Now for 60 Days           -   **CARBAMAZEPINE (TEGRETOL)** 600 MG Oral QPM First Dose Now for 60 Days           -   **LACTULOSE (CEPHULAC)** 10 G Oral QHSPRN Constipation for 60 Days           -   **HEPARIN** 5000 UNITS Subcutaneous Q8H First Dose Now for 60 Days           -   **BISACODYL E.C. (BISACODYL)** 10 MG Oral QDAYPRN Constipation for 60 Days           -   **  BUDESONIDE INHALATION (PULMICORT RESPULES)** 1 MG Inhalation BID First Dose Now for 60 Days           Comment 55 yo male with history of HFrEF (BiV dysfunction, EF 20-25% 05/2019),  COPD not on home O2, pulmonary nodules, HTN, DM, and Schizoaffective disorder  who presented for shortness of breath, found to be febrile and to have hypoxic  respiratory failure concerning for primary pneumonia, component of  decompensated heart failure / COPD exacerbation.        --NEURO--    # Toxic [place] Encephelopathy    Patient with worsening mental status 2/26 with a lititbe bit of confusion on  3/1; unclear if far from baseline. ABG with pCO2 60, largely unchanged. Ddx  HFrEF exacerbation versus sepsis versus uremia versus missed po psychiatric  medications. Has also been noted to have elevated ammonia levels in past.  Currently able to protect airway    - Will continue to monitor mental status to determine if needs intubation for  airway protection    - s/p precedex        #Schizoaffective disorder    Alert, partially oriented, reports baseline memory issues / confusion. On 2/26  concern for aspiration made NPO. Has been unable to receive psych meds since  2/27 given patient removed dobhoff and meds unable to be converted to IV    -Cont home Carbamazepine 400 mg qAM / 600mg  qHS    -Cont home Mirtazapine 15 mg qHS    -Cont home Zyprexa 10 mg qHS    -Cont home  Seroquel 300 mg qHS    --- Will have SLP re-evaluate with plan to replace dobhoff for med  administration if does not pass SLP        --PULM/ID--    #Acute hypoxic respiratory failure    #Pneumonia    #Acute exacerbation of COPD (not on home O2)    H/o COPD (last PFTS (07/21/19) FEV1 24% with FEV1/FVC 40% GOLD IV), prior  admission 01/2020 for pseudomonas pneumonia. Presented with 3 days of SOB,  fevers, and difficulty walking due / DOE. Found febrile, with leukocytosis,  and CT revealing consolidative opacity with surrounding patchy groundglass  opacities involving the entire left lower lobe concerning for pneumonia.  Likely overall acute hypoxic respiratory failure multifactorial in nature,  primary pneumonia (WBC, fever, imaging) with component of CHF exacerbation  (edematous, wt up) / COPD component (wheezing on exam, mild respiratory  acidosis). Admitted to MICU d/t high flow requirement. Started on abx per  below. Diuresed w/ initial poor response. Urine legionella / strep negative.  Given lack of wheezing on exam today, concern higher for CHF and pneumonia as  contributing to acute hypoxic resp failure        Dx:    - Blood cultures 2/24 NGTD    - Unable to produce sputum for sputum cx    Tx:    - Cefepime (2/24-2/26) transitioned to Meropenem (2/27-) given altered mental  status    --- Plan for 7 day course to complete 3/3    --- S/p ceftazidime (2/27)    - Vanc d/c 2/27 given MRSA swab negative    - Continue standing Duonebs, Budesonide, mucolytics    - Chest PT q4hr and Saline nebs q4hr    - S/p Azithromycin 500mg  daily (2/24-2/27), Prednisone 40mg /60mg  (2/24-2/26)  for COPD exacerbation    - Continue NPO given tenuous respiratory status; try to advance diet pending  SLP    - Continue HFNC,  wean FiO2 as tolerated for goal SpO2>92% (currently at 40%  50L in AM)        --CV--    #Acute on chronic systolic heart failure (LVEF 20-25% 01/2020)    #BiV dysfunction (Severely Reduced RV function)    #Type II NSTEMI  2/2 Demand Ischemia    #HLD    Known h/o systolic HF - last TTE (01/2020) w/ LVEF 20-25%, global LV  hypokinesis, RV dilation with severely reduced systolic function, 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 Initial picture of likely volume overload leading to  acute hypoxic respiratory failure. EKG without ischemic findings, trop 0.12.  BNP 737 (down from 1085 01/2020 admit), weight up 84kg (dry wt 75kg). Previous  admits for CHF, unclear follow up (planned to f/u w/ Renne Crigler). At that  time discharged on Toprol, last filled Lopressor 25mg  BID 04/2020, unclear  current BB regimen ongoing vs d/c'd. Previously on Aldactone, d/c'd d/t  hyperkalemia. Planned to consider ACE-I / SGLT-2 outpatient. Diuresed in ED w/  IV Lasix 120mg  / 200mg  with poor reported output, and subsequent hypotension.  Received Albumin x1.    - TTE (2/25) w BiV dysfunction (EF 20-25%); RV moderately/severely dilated  and severely reduced RV; RA dilation, elevated RA pressure, RV overload mild  MR, severe TR, trace AR, moderate PR    - Trop 0.12 -->0.08    Tx:    - HOLD Diuretics as -8L overnight 2/28    - s/p lasix gtt at 40    -Holding home Hydralazine 50mg  TID given hypotension    -Would benefit from RHC +/- this admit to further evaluate ? PAH    -Consider inpatient HF consult for GDMT initation and optimization as patient did not follow up outpatient    -Continue home ASA and Atorvastatin         --RENAL--    #AKI    #Hyperkalemia - Resolved    Patient with worsening kidney function from baseline 1.4 with peak of 4.14  probable prerenal from cardiorenal. Urine output improved after starting lasix  gtt    - Renal C/s    - HOLD lasix gtt given -8L    - s/p lasix gtt at 40 with diuril bid        # Anion Gap Metabolic Acidosis- Likely starvation ketosis    AG 17 today without concomitant acidosis (CO2 26). No recent hypotension  suggestive of lactic acidosis and  infection well controlled without recent  fevers or worsening secretions. Had not been able to maintain enteral access  for feeds and mental status precluding oral intake, so probable starvation  ketosis    - elevated BHB to 1.40, lactate 0.8    - Improving on 3/1        --GI--    #Nutrition    - Currently NPO    --- Unable to sustain enteral access with acute delirium, as above    --- SLP re-evaluation today        #Constipation    - Give dulcolax suppository today if unable to get enteral access for oral  meds        --ENDO--    #T2DM - Last A1C 7.5 01/2020    -Holding home metformin    -ISS        --HEME--    H/H stable, no active issues.        Full code (confirmed)  NPO (Will have SLP eval with plan to start oral feeds to tube feeds today)    SQ Heparin, PPI    Daughter: Hulda Humphrey: [phone]    Social: Pt lives in a group home.            **Electronically signed by Renea Ee, MD on 07/09/2020 10:56**        **Electronically signed by Renea Ee, MD on 07/09/2020 10:56**

## 2020-07-04 NOTE — Progress Notes (Signed)
 Supervisory Note For Fellow I performed a history and physical examination of  the patient and discussed the management with the fellows. I reviewed the  fellow's note and agree with the documented findings and plan of care. Yes.            **Electronically signed by Margret Chance, MD on 07/12/2020 14:30**

## 2020-07-04 NOTE — Progress Notes (Signed)
**  Note**    **_Speech-Language Pathology_**        SLP following for ongoing assessment of oropharyngeal swallow mechanism and  readiness to safely resume PO. Upon arrival to room this morning, RN deferred  PO trials given pt. currently on BiPAP with increased somnolence. We will  continue to follow and re-attempt bedside eval as clinically appropriate.  Please page with questions or concerns.        Benedetto Goad, MS CCC-SLP    Pager: [phone]                **Electronically signed by Thereasa Solo, CCC-SLP on 07/10/2020 09:39**

## 2020-07-04 NOTE — Progress Notes (Signed)
 **Subjective**    Reason for Referral AKI.    Subjective Cr stable.    UOP 3L. .    **Exam**    Vital Signs    07/10/2020 08:29              -  O2 Amount: 40%          -  Morse Fall Risk Total: 50      CFS Vital Signs CS    07/10/2020 14:59              -  Shift Intake Total: 0ml          -  Shift Output Total:          -  Shift Balance: -      07/10/2020 09:00              -  Pain: 0          -  Riker: 3 - Arouses to verbal stimuli / gentle shaking          -  Temp Calc: 36.67          -  Temp Site: Axillary          -  Heart Rate: 91          -  Cardiac Rhythm: NSR - Normal Sinus Rhythm          -  BP Systolic: 122          -  BP Diastolic: 77          -  MAP: 85          -  BP Site: Left Arm          -  BP Position: Lying          -  BP Source: NIBP          -  Respirations: 16          -  SPO2 %: 92 (89-100)          -  Mode: CPAP/BIPAP          -  FiO2: 45%          -  Set Rate: 16          -  TV: 466          -  PS: 22          -  Peep: 7          -  Oral Hygiene Q4: Yes          -  Foley care per protocol: Yes          -  IV Infusion 1: Normal Saline (KVO/Meds)          -  IV Dose 1: 5 ml/hr          -  ml/hour 1: 5 ml/hr          -  Medication Name 1: Acetazolamide       07/10/2020 04:00              -  Call Bell Within Reach: Yes      07/10/2020 00:00              -  High Fall Risk Interventions: Yes      07/09/2020 18:00              -  EtCO2: 92          -  HOB Elevation: 30      07/09/2020 17:00              -  Repositioned Side: Left      07/09/2020 15:00              -  Spont TV: 413      07/09/2020 14:00              -  Medication Dose 1: 500mg           -  Medication Freq 1: One Time Only      07/09/2020 08:00              -  Posterior Tibial: Right - Palpable,Left - Palpable          -  Dorsalis Pedis: Right - Palpable,Left - Palpable          -  Radial: Right - Palpable,Left - Palpable      07/09/2020  06:10              -  Comment: P&V on bed while getting nebs      07/09/2020 01:27              -  Respiratory Comments: pt acutely desatted, RN and RT in room, HFNC placed on 100% briefly, RT increased to 50L          I have reviewed and agree with vital signs as listed in the EMR Yes Time  07/10/2020 17:03.    Comment General: On BIPAP; appears more somnolent but will awaken to voice    CV: RRR with no m/r/g    Pulm: Rhonchi anteriorally b/l, no wheezes    GI: soft, NT/NT    Extremities: No pitting edema in lower extremity edema, wwp, more wrinkles  than before    Skin: no rashes.    **Assessment and Plan**    Medication              -   ** ** ** **INSULIN REGULAR HUMAN (HUMULIN R)******** Sliding Scale Subcutaneous Q6H for 60 Days   Pending Date: 07/10/2020     For Glucose 80 to 150 Give 0 Units    For Glucose 151 to 200 Give 2 Units    For Glucose 201 to 250 Give 3 Units    For Glucose 251 to 300 Give 4 Units    Notify Physician if: Call HO for glucose > 300          -   **METHYLPREDNISOLONE (SOLU-MEDROL)** 60 MG Intravenous Q6H First Dose Now for 60 Days, Clinician Dir:IV PUSH OVER 3 -5 MINUTES           -   **BISACODYL (DULCOLAX)** 10 MG Rectal QDAYPRN Constipation for 60 Days, Clinician Dir:NOT RELIEVED BY PO           -   **MEROPENEM (MERREM)** 500 MG Intravenous Q12H First Dose Now for 60 Days, Clinician YNW:GNFAOZ TO 100 ML NS MED PLUS. INFUSE FIRST DOSE OVER 30 MINUTES AND ALL SUBSEQUENT DOSES OVER 3 HOURS.           -   **SODIUM CL 0.9% FOR INH (STOCK) (SODIUM CHLORIDE NEB(STOCK))** 3 ML Inhalation Q4H for 60 Days           -   **SENNA (SENOKOT)** 17.2 MG Oral QDAY for 60 Days           -   **OLANZAPINE (ZYPREXA)** 10 MG Oral QHS STAT and then  Routine for 60 Days, Clinician Dir:MAX DAILY DOSE: 20 MG/DAY           -   **ACETAMINOPHEN (TYLENOL)** 650 MG Oral Q6HPRN Pain, Fever, Clinician Dir:NOT TO EXCEED 4 G APAP PER DAY           -   **PANTOPRAZOLE (PROTONIX)** 40 MG Intravenous QDAY  First Dose Now for 60 Days           -   **ATORVASTATIN (LIPITOR)** 80 MG Oral QHS for 60 Days           -   **TAMSULOSIN (FLOMAX)** 0.4 MG Oral QHS for 60 Days           -   **BRIMONIDINE 0.2% OPHTH (ALPHAGAN 0.2% OPHTH)** 1 DROP Both Eyes SOL TID for 60 Days           -   **DORZOLAMIDE 2% OPHTH (TRUSOPT 2% OPHTH)** 1 DROP Both Eyes SOL BID for 60 Days           -   **DOCUSATE SODIUM (COLACE)** 100 MG Oral BID for 60 Days           -   **ASPIRIN CHEWABLE** 81 MG Oral QDAY for 60 Days           -   **IPRATROPIUM/ALBUTEROL SULFATE (DUONEB)** 3 ML Inhalation Q6H for 60 Days           -   **GUAIFENESIN SYRUP** 200 MG Oral Q4H First Dose Now for 60 Days           -   **MIRTAZAPINE (REMERON)** 15 MG Oral QHS First Dose Now for 60 Days           -   **LACTULOSE (CEPHULAC)** 10 G Oral QHSPRN Constipation for 60 Days           -   **HEPARIN** 5000 UNITS Subcutaneous Q8H First Dose Now for 60 Days           -   **BISACODYL E.C. 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    - now with post ATN diuresis -        - Cr stable, UOP remains high on its own. Diamox on for alkalosis, can stop  once bicarb <30 .            **Electronically signed by Dorris Fetch, MD on 07/10/2020 17:14**

## 2020-07-04 NOTE — H&P (Signed)
 **Chief Complaint/HPI**    Chief Complaint Type 2 diabetes.    HPI 7M w/ pmhx significant for HFrEF (BiV dysfunction with EF 20-25% and  severely reduced RV function 05/2019), COPD (not on home O2 and current tobacco  use), pulmonary nodules, HTN, Type 2 DM and Schizoaffective disorder, who  presented with shortness of breath and fevers 2/24 and was found to have  hypoxic and hypercapnic respiratory failure concerning for pneumonia,  decompensated heart failure, and COPD exacerbation. We are consulted for  further blood sugar management.        Diabetes history - Patient tells me he has never been told that he has  diabetes and that he has no known family hx of diabetes. He had an A1c of 7.5%  from September 2021. He lives in a group home and tells me they give him his  medications and check his fingersticks. Per his record, he takes glucophage  1000mg  BID. Patient is a poor historian.        GFR - 36    A1c - unknown.    **Diabetes History**    Type of DM Type 2 (Diabetes).    Current Diabetes Regimen glargine 4 units qhs    regular insulin low dose correction q6h.    Outpatient Diabetes Regimen Glucophage 1000mg  BID.    HTN Yes.    CAD No.    Evidence of Retinopathy Evidence of Retinopathy (Not Known) Not Known.    A1C Not Known.    Nephropathy Not Known.    Generalized Peripheral Neuropathy No.    SSTI/Diabetic Foot No.    Current Diet Current Diet Enteral feeds,    Dietary              -   **Diet:** , Heart Healthy - Low Saturated Fat, NAS, Fluid Restriction (Discontinue)          -   **Diet:** , House-Regular (Discontinue)          -   **Diet:** , NPO except medications and small sips of water/ice chips (Discontinue)          -   **Diet:** , NPO except medications and small sips of water/ice chips OK for applesauce with meds (Discontinue)          -   **Tube/Enteral Feeding:** Nutren 1.0 Rate 20 mL/hr via NG Tube Advance by 20 mL/hr every 6 Hours to a goal of 70 mL/hr;          -   **Diet:**  , House-Regular, Thin Liquid OK for applesauce with meds (Discontinue)          -   **Diet:** , Diabetic, Thin Liquid OK for applesauce with meds (Discontinue)          -   **Diet:** , Low Potassium (2 gm Potassium), Thin Liquid OK for applesauce with meds            Medication              -   **INSULIN NPH HUMAN (HUMULIN N)** 8 UNITS Subcutaneous QAM for 60 Days, Clinician ZOX:WRUE STEROID ADMINISTRATION   Pending Date: 07/16/2020          -   **FUROSEMIDE (LASIX)** 40 MG Oral QDAY First Dose Now for 60 Days           -   **PREDNISONE (DELTASONE)** 40 MG Oral QDAY First Dose Now for 60 Days           -   **  POLYETHYLENE GLYCOL (MIRALAX)** 17 G Oral BID for 60 Days           -   **METOPROLOL (LOPRESSOR)** 6.25 MG Oral QID for 60 Days           -   **CAPTOPRIL (CAPOTEN)** 6.25 MG Oral TID First Dose Now for 60 Days           -   **OLANZAPINE (ZYPREXA)** 10 MG Oral QHS for 60 Days           -   **QUETIAPINE FUMARATE (SEROQUEL)** 300 MG Oral QHS for 60 Days           -   **MIRTAZAPINE (REMERON)** 15 MG Oral QHS for 60 Days           -   **CARBAMAZEPINE (TEGRETOL)** 600 MG Orogastric Tube QPM for 60 Days           -   **GUAIFENESIN SYRUP** 200 MG Oral Q4H First Dose Now           -   **CARBAMAZEPINE (TEGRETOL)** 400 MG Orogastric Tube QAM for 60 Days           -   **DOCUSATE SODIUM** 100 MG Nasogastric BID for 60 Days           -   **HYDRALAZINE (APRESOLINE)** 10 MG Intravenous Q6HPRN SBP > 180 mmHg for 60 Days, Clinician Dir:IV PUSH FOR ADULTS PER IV GUIDELINE           -   ** ** ** **INSULIN REGULAR HUMAN (HUMULIN R)******** Sliding Scale Subcutaneous Q6H for 60 Days      For Glucose 80 to 150 Give 0 Units    For Glucose 151 to 200 Give 2 Units    For Glucose 201 to 250 Give 3 Units    For Glucose 251 to 300 Give 4 Units    Notify Physician if: Call HO for glucose > 300          -   **BISACODYL (DULCOLAX)** 10 MG Rectal QDAYPRN Constipation for 60 Days, Clinician Dir:NOT RELIEVED BY  PO           -   **SODIUM CL 0.9% FOR INH (STOCK) (SODIUM CHLORIDE NEB(STOCK))** 3 ML Inhalation Q4H for 60 Days           -   **SENNA (SENOKOT)** 17.2 MG Oral QDAY for 60 Days           -   **ACETAMINOPHEN (TYLENOL)** 650 MG Oral Q6HPRN Pain, Fever, Clinician Dir:NOT TO EXCEED 4 G APAP PER DAY           -   **TAMSULOSIN (FLOMAX)** 0.4 MG Oral QHS for 60 Days           -   **ATORVASTATIN (LIPITOR)** 80 MG Oral QHS for 60 Days           -   **BRIMONIDINE 0.2% OPHTH (ALPHAGAN 0.2% OPHTH)** 1 DROP Both Eyes SOL TID for 60 Days           -   **DORZOLAMIDE 2% OPHTH (TRUSOPT 2% OPHTH)** 1 DROP Both Eyes SOL BID for 60 Days           -   **ASPIRIN CHEWABLE** 81 MG Oral QDAY for 60 Days           -   **IPRATROPIUM/ALBUTEROL SULFATE (DUONEB)** 3 ML Inhalation Q6H for 60 Days           -   **  LACTULOSE (CEPHULAC)** 10 G Oral QHSPRN Constipation for 60 Days           -   **HEPARIN** 5000 UNITS Subcutaneous Q8H First Dose Now for 60 Days           -   **BISACODYL E.C. (BISACODYL)** 10 MG Oral QDAYPRN Constipation for 60 Days           -   **BUDESONIDE INHALATION (PULMICORT RESPULES)** 1 MG Inhalation BID First Dose Now for 60 Days         **History**    Significant Med Hx As Listed    Addt'l Med History    HFREF (LVEF 20-25% and severely reduced RV function)    Severe TR    COPD (FEV1 24%, FEV1/FVC 40%) not on home O2    DM2    HTN    Schizoaffective disorder    CKD stage G3.    Smoking Status Current Every Day Smoker.    Alcohol Use Denies.    Illicit Drug Use Denies.    **ROS**    ROS A complete 10 systems review was done and all were negative except for  those documented in the history above.    **Allergies**              -  Depakote          -  Lithium        **Exam**    Hyperglycemia Labs    07/15/2020 06:02              -  Creatinine (CR): 2.11 Hmg/dL      16/02/9603 54:09              -  Creatinine (CR): 2.05 Hmg/dL            Vital Signs    07/15/2020 11:51              -  Temperature:  37.2 (35-37.8Cel)          -  Site: Oral          -  Heart Rate: 88 (60-90)          -  Site: Monitor          -  BP: 100/64 (90-140/60-90)          -  Site: Right Arm          -  Position: Sitting          -  Method: Automated          -  Respirations: 18 (12-20)          -  O2 Saturation (%): 92          -  O2 Delivery Method: Nasal Cannula          -  MEWS Vital Sign Score: 0      07/14/2020 21:00              -  Morse Fall Risk Total: 35      07/14/2020 06:21              -  Weight: 67.9kg      07/14/2020 04:52              -  O2 Amount: 2.0 LPM      CFS Vital Signs CS    07/15/2020 06:59              -  Shift  Intake Total: 0ml          -  Shift Output Total:          -  Shift Balance: -          GEN GEN NAD.    HEENT HEENT neck supple.    Lungs Lungs Clear Bilateral.    Heart Heart S1 & S2 normal.    Abdomen Abdomen SNTTP, Abdomen +bowel sounds x4.    Extremities Extremities no tremor in hands bilaterally, no clubbing, cyanosis  or edema.    Neurology Neurology normal motor in UE, normal sensory.    Derm/Skin Derm/Skin no striae noted, no skin dryness.    Psych Psych no anxiety, no depression.    **Assessment and Plan**    Assessment 89M w/ pmhx significant for HFrEF (BiV dysfunction with EF 20-25%  and severely reduced RV function 05/2019), COPD (not on home O2 and current  tobacco use), pulmonary nodules, HTN, Type 2 DM and Schizoaffective disorder,  who presented with shortness of breath and fevers 2/24 and was found to have  hypoxic and hypercapnic respiratory failure concerning for pneumonia,  decompensated heart failure, and COPD exacerbation. We are consulted for  further blood sugar management. .    Plan    **1.   Type 2 diabetes**    Patient is on Prednisone 40mg  for treatment of COPD exacerbation. He has been  on continuous tube feeds d/t failed SLP evals but was cleared for a regular  diet today. Patient has had blood sugars up to the 200s during the day  with  lower blood sugars in the morning. Will switch him from glargine qhs to NPH  daily in order to better match the action of prednisone and prevent low blood  sugars.        _RECOMMENDATIONS:_    - **Discontinue** glargine 4 units daily    - **Start** NPH 8 units daily with steroid administration (first dose 3/8; hold if steroid held)    -Continue regular insulin low dose correction q6h while tube feeds are running ( **switch to lispro tidwm if TF stopped** )        Directions For any questions about glycemia management, please contact the  hyperglycemia service pager [phone]) from 8am - 5pm M-F. Other times contact  the on-call endocrinology fellow.    **Discharge**    Please bring glucose meter to every visit Please bring glucose meter to every  visit.    If you have any questions or concerns about follow up, please contact the  endocrine clinic in [place] [facility] If you have any questions or concerns about  follow up, please contact the endocrine in [place] [address] at [address],  [place] at [phone].    Discharge Instructions (Free Text)    1.  Type 2 diabetes    -This patient's A1c is unknown, was taking Metformin at home. Patient's blood sugars are checked by his group home.     -This patient should check BG at home 1-2 times per day        _- Given BG readings here in the hospital and given A1c value, I would  recommend this patient be discharged on the following :_    **\--> To be determined closer to discharge**        ***Prescriptions needed by primary team:**    _To be determined closer to discharge_                **  Electronically signed by Irving Burton on 07/15/2020 12:34**

## 2020-07-04 NOTE — ED Provider Notes (Signed)
 .  .  Name: Arthur Young, Arthur Young  MRN: [id number]  Age: 55 yrs  Sex: Male  DOB: 04-12-1966  Arrival Date: 07/04/2020  Arrival Time: 16:18  Account#: [id number]  Bed A5  PCP: Berenice Primas  Chief Complaint: Shortness Of Breath  .  Presentation:  02/24  16:19 Presenting complaint: EMS states: presents from group home with kh29        SOB, fever, dry cough x3 days. Found 80% on RA, speaking in        full sentences, tachypneic. RR30's, HR 120's. Awake / alert.        Care prior to arrival: Medication(s) given: Albuterol Neb x2.  16:19 Method Of Arrival: EMS: [facility]                              kh29  16:19 Acuity: Adult 2                                                 kh29  .  Historical:  - Allergies:  16:18 Depakote;                                                       kh29  16:18 Lithium Carbonate;                                              kh29  - PMHx:  16:18 Hypertension; CHF; COPD; Cataract; Diabetes-NIDDM; Glaucoma;    kh29  .  - The history from nurses notes was reviewed: and I agree with    what is documented.  .  .  Screening:  16:18 Safety screen: Patient feels safe. Exposure Risk/Travel         kh29        Screening: COVID Symptomsquestion Fever. Difficulty Breathing. Known        COVID 19 exposurequestion No. DPH requests Isolationquestion(COVID) No. Have        you tested + for COVIDquestion No. COVID 19 Vaccinequestion Yes-patient        states they completed COVID vaccine recommendations over 2        weeks ago.  16:23 SEPSIS SCREENING - Temp > 38.3 or < 36.0 Yes - Heart Rate > 90  kh29        Yes - Respiratory > 20 Yes - SBP < 90 No SIRS Criteria (> = 2)        Name of MD/PA notified: MD colangelo.  .  Vital Signs:  16:21 BP 127 / 70 (auto/reg); Pulse 126; Resp 30; Temp 40.2(O); Pulse kh29        Ox 96% on 6 lpm NC;  16:40 Pulse 124; Resp 39; Temp 39.4; Pulse Ox 91% on 6 lpm NC;        vd7  16:46 BP 112 / 66;  vd7  17:34 Pulse 115 Monitor; Pulse Ox 98% on NC;                           vd7  18:18 BP 122 / 71; Pulse 117; Resp 29; Pulse Ox 98% on NC;            vd7  18:41 Pulse 113 Monitor; Resp 28; Temp 37.4(O); Pulse Ox 99% on NC;   vd7  19:14 BP 106 / 64; Pulse 109; Resp 25; Pulse Ox 100% on NC;           vd7  20:27 BP 92 / 63; Pulse 102; Resp 22;                                 ec22  22:18 BP 94 / 64; Pulse 96; Resp 20; Pulse Ox 100% on 15%             ec22  .  Name:Arthur Young  [id number]  [id number]  Page 1 of 5  %%PAGE  .  Name: Arthur Young  MRN: [id number]  Age: 33 yrs  Sex: Male  DOB: 1965-12-03  Arrival Date: 07/04/2020  Arrival Time: 16:18  Account#: [id number]  Bed A5  PCP: Berenice Primas  Chief Complaint: Shortness Of Breath  .        Non-rebreather mask;  17:34 Sinus tachycardia                                               vd7  18:41 Sinus tachycardia                                               vd7  17:34 High flow                                                       vd7  18:18 high flo                                                        vd7  18:41 High Flo                                                        vd7  19:14 high flo 40L/min 100% fio2                                      vd7  20:27 Pt on 100% on 40% hi flow.  ec22  Maury Dus Coma Score:  16:21 Eye Response: spontaneous(4). Verbal Response: oriented(5).     kh29        Motor Response: obeys commands(6). Total: 15.  16:40 Eye Response: spontaneous(4). Verbal Response: oriented(5).     vd7        Motor Response: obeys commands(6). Total: 15.  16:41 Eye Response: spontaneous(4). Verbal Response: oriented(5).     vd7        Motor Response: obeys commands(6). Total: 15.  .  Triage Assessment:  16:24 General: Appears in no apparent distress, ill, uncomfortable,   kh29        Behavior is cooperative. Pain: Denies pain. Neuro: Eye opening:        Spontaneously Level on consciousness: Sustained Attention Motor        Response: Obeys Commands. Respiratory: Airway  is patent        Respiratory pattern is tachypnea Reports shortness of breath        cough that is Onset: The symptoms/episode began/occurred        gradually, the patient has moderate shortness of breath.  .  Assessment:  16:41 General: Appears distressed, ill, Behavior is cooperative.      vd7        Pain: Complains of pain in right foot and left foot Pain        currently is 0/10 Quality of pain is described as aching, Is        intermittent. Neuro: Eye opening: Spontaneously Level on        consciousness: Sustained Attention Verbal Response:        Orientation: Oriented x 3 Speech: Clear. Eye movements: No Gaze        preference Facial Droop: Appears Normal Motor Response: Obeys        Commands. Cardiovascular: Capillary refill < 3 seconds Edema is        2+ to left midcalf, left ankle, left foot, right midcalf, right        ankle and right foot Rhythm is sinus tachycardia Chest pain is        denied. Respiratory: Airway is patent Trachea midline        Respiratory effort is labored, Respiratory pattern is        tachypnea. GI: No deficits noted. GI: Abdomen is distended,        obese, Denies nausea, vomiting. GU: No deficits noted. Skin:        Skin is intact, Skin is dry, Skin is normal, Skin temperature  .  Name:[name], [name]  [id number]  [id number]  Page 2 of 5  %%PAGE  .  Name: Arthur Young  MRN: [id number]  Age: 50 yrs  Sex: Male  DOB: 03-01-1966  Arrival Date: 07/04/2020  Arrival Time: 16:18  Account#: [id number]  Bed A5  PCP: Berenice Primas  Chief Complaint: Shortness Of Breath  .        is hot.  17:34 Reassessment: RT at bedside, pt placed on HIFLO 40L/min at 100% vd7        FiO2.  18:47 Reassessment: ABG drawn from R radial artery. Pressure dressing vd7        applied. No active bleeding from site.  19:11 Reassessment: Verbal report obtained from [place].             ec22  19:14 Reassessment: Pt resting on stretcher. Monitor on.              ec22  21:25 Reassessment: Pt  at CT scan.                                     ec22  21:33 Reassessment: Respiratory at bedside. PT on monitor.            ec22  21:53 Reassessment: Pt bladder scanned at 91 mL.                      ec22  22:14 Reassessment: Report called to [facility].                            ec22  .  Observations:  16:18 Patient arrived in ED.                                          kh29  16:21 Triage Completed.                                               kh29  16:26 Patient Visited By: Tera Helper                        np9  16:39 Patient Visited By: Fredric Dine                                dw5  19:15 Patient Visited By: Orland Dec  20:29 Patient Visited By: Orland Dec  21:34 Patient Visited By: Orland Dec  21:47 Patient assigned to A5                                          lr15  22:02 Patient assigned to A5                                          lr15  .  Procedure:  16:45 COVID-19 PCR (Symptomatic patients) Sent.                       vd7  16:45 ALT/SPGT (Alanine Aminotransferase) Sent.                       vd7  16:45 Troponin I Sent.  vd7  16:45 PTT Sent.                                                       vd7  16:45 PT (Prothrombin Time With INR) Sent.                            vd7  16:45 Lipase Sent.                                                    vd7  16:45 Lactate Sent.                                                   vd7  16:45 CR (Creatinine) Sent.                                           vd7  16:45 CBC/Diff (With Plt) Sent.                                       vd7  16:45 Bilirubin, Total Sent.                                          vd7  16:46 GLU (Glucose) Sent.                                             vd7  16:46 LYTES (Na, K, Cl, Co2) Sent.                                    vd7  16:46 Bilirubin, Direct Sent.                                         vd7  16:46 Alk  Phos (Alkaline Phosphatase) Sent.                           vd7  16:46 AST/SGOT (Aspartate Aminotranferase) Sent.                      vd7  16:46 EKG done. (by ED staff). Old EKG Obtained Reviewed By: Barrie Folk MD.  17:25 Inserted peripheral IV: 20 gauge in left hand.  vd7  .  Name:Otting, Rudransh  [id number]  [id number]  Page 3 of 5  %%PAGE  .  Name: Rana, Adorno  MRN: [id number]  Age: 29 yrs  Sex: Male  DOB: [birth year]  Arrival Date: 07/04/2020  Arrival Time: 16:18  Account#: [id number]  Bed A5  PCP: Berenice Primas  Chief Complaint: Shortness Of Breath  .  17:27 Blood Culture 2 of 2 Sent.                                      vd7  17:27 UA w/Refl Cult Sent.                                            vd7  18:39 ABG - Blood Gas Sent.                                           vd7  18:41 Phlebotomy.                                                     vd7  .  Dispensed Medications:  16:45 Drug: Tylenol 975 mg Route: PO;                                 vd7  17:49 Drug: Vancomycin pt wt >/= 95 kg - vancomycin 1500 mg Route: IV;vd7  19:53 Follow up: Response: No Adverse Reaction; IV Status: Completed  ec22        infusion  19:53 Drug: Cefepime 2 grams Route: IV;                               ec22  20:26 Drug: Lasix - Furosemide 120 mg Route: IVPB; Infused Over: 60   ec22        mins;  .  [name]  Intake:  18:40 IV: 500.3ml; Total: 500.69ml.                                  vd7  .  Interventions:  17:03 EMS Sheet Scanned into Chart                                    dw  17:24 Demo Sheet Scanned into Chart                                   dw  17:28 POC Test Accucheck POC is 145.                                  vd7  18:09 ECG/EKG Scanned into Chart  dw  20:46 Patient placed on CPAP BiPAP Place on Bipap 12/5 titrated to    cc33        40%; HFNC discontinued.  22:24 Patient Belongings Scanned into Chart                           dw  .  Outcome:  21:43 Decision to  Hospitalize by Provider.                            ds27  22:39 Admitted to [facility] accompanied by nurse, accompanied by tech, via ec22        stretcher, with oxygen, on monitor. Admitted to [facility] with        chart. Condition: stable. Discharge Assessment: Patient awake,        alert and oriented x 3. No cognitive and/or functional deficits        noted. Patient verbalized understanding of disposition        instructions. Chart Status Nursing note complete and        electronically signed.  22:40 Patient left the ED.                                            ec22  .  Corrections: (The following items were deleted from the chart)  16:23 16:19 Presenting complaint: EMS states: presents from group     kh29        home with SOB, faver, dry cough x3 days. Found 80% on RA,        speaking in full sentences, tachypneic. kh29  .  Name:Vandenbosch, Shahiem  [id number]  [id number]  Page 4 of 5  %%PAGE  .  Name: Yani, Lal  MRN: [id number]  Age: 38 yrs  Sex: Male  DOB: 1965/06/30  Arrival Date: 07/04/2020  Arrival Time: 16:18  Account#: [id number]  Bed A5  PCP: Berenice Primas  Chief Complaint: Shortness Of Breath  .  16:24 16:18 SEPSIS SCREENING SIRS Criteria (> = 2) No kh29            kh29  18:19 16:40 Pulse 124bpm; Resp 19bpm; Pulse Ox 91% 6 lpm Nasal        vd7        Cannula; Temp 39.4C; vd7  20:31 20:27 BP 92 / 63; Pulse 102bpm; Resp 22bpm; Pulse Ox 100% 02    ec22        40% BVM; ec22  .  Signatures:  [place] Charon, Delta                                  dw  Fredric Dine                            Reg  dw5  Donnamarie Poag                           RN   lr15  [place], Natalia                    PA-C np9  Wendee Copp, RRT  RRT  cc33  Merlene Laughter                            RN   kh29  Otis Peak                         RN   vd7  Irine Seal                           PA-C ds27  [place], Heather                            hb5  Rogue Bussing                         RN    ec22  .  .  .  .  .  .  .  .  .  .  .  .  .  .  .  .  .  .  .  .  .  .  .  .  .  .  Name:Pommier, Kvion  [id number]  [id number]  Page 5 of 5  .  %%END

## 2020-07-04 NOTE — Progress Notes (Signed)
**Subjective**    Reason for Referral AKI.    Subjective Better, going to floor    Comfortable, sitting ir airmchair, no SOB    Kidney function improving    Afebrile    Balance net even.    **Exam**    Vital Signs    07/14/2020 13:35              ???  Morse Fall Risk Total: 50      07/14/2020 13:07              ???  Temperature: 36.4 (35-37.8Cel)          ???  Site: Oral          ???  Heart Rate: 84 (60-90)          ???  Site: Monitor          ???  BP: 92/61 (90-140/60-90)          ???  Site: Right Arm          ???  Position: Lying          ???  Method: Automated          ???  Respirations: 18 (12-20)          ???  O2 Saturation (%): 96          ???  O2 Delivery Method: Nasal Cannula          ???  MEWS Vital Sign Score: 0      07/14/2020 06:21              ???  Weight: 67.9kg      07/14/2020 04:52              ???  O2 Amount: 2.0 LPM      07/13/2020 15:34              ???  Mean Arterial Pressure1: 91 (60)      CFS Vital Signs CS    07/14/2020 14:59              ???  Shift Intake Total: 0ml          ???  Shift Output Total:          ???  Shift Balance: -      07/13/2020 13:00              ???  Pain: 0          ???  Riker: 4 - Calm, awakens easily,follows commands          ???  Heart Rate: 81          ???  Cardiac Rhythm: NSR - Normal Sinus Rhythm          ???  BP Systolic: 115          ???  BP Diastolic: 77          ???  MAP: 89          ???  BP Site: Right Arm          ???  BP Position: Sitting          ???  BP Source: NIBP          ???  Respirations: 21          ???  SPO2 %: 93 (89-100)          ???  O2 Amount: 0.5 LPM          ???  Mode: Nasal Cannula          ???  Call Bell Within Reach: Yes          ???  High Fall Risk Interventions: Yes          ???  Repositioned Side: In Chair          ???  Enteral Feeding: Nutren 1.0          ???  Enteral Feeding Rate: 70cc/hr          ???  IV Infusion 1: Normal Saline (KVO/Meds)          ???  IV Dose 1: HOLD          ???  ml/hour 1: HOLD      07/13/2020 12:00               ???  Temp Calc: 36.80          ???  Temp Site: Oral      07/13/2020 06:00              ???  HOB Elevation: 30          Comment Vitals per Soarian.        Gen: in NAD, comfortable in chair at bedside    HEENT: DHT in place, nasal cannula    Cardio: RRR, no MRGs    Lungs: normal WOB, bilateral crackles at the bases L>R    Abd: ND, NT    Ext: WWP, no pitting edema    Skin: darkening of bilateral shins ?venous stasis.    **Assessment and Plan**    Medication              ???   **HYDRALAZINE (APRESOLINE)** 25 MG Oral TID for 57 Days           ???   **BISACODYL (DULCOLAX)** 10 MG Rectal ONE TIME for 1 Doses           ???   **FUROSEMIDE (LASIX)** 40 MG Oral QDAY First Dose Now for 60 Days           ???   **PREDNISONE (DELTASONE)** 40 MG Oral QDAY First Dose Now for 60 Days           ???   **INSULIN GLARGINE(LANTUS) (INSULIN LANTUS)** 4 UNITS Subcutaneous QHS for 60 Days           ???   **POLYETHYLENE GLYCOL (MIRALAX)** 17 G Oral BID for 60 Days           ???   **METOPROLOL (LOPRESSOR)** 6.25 MG Oral QID for 60 Days           ???   **CAPTOPRIL (CAPOTEN)** 6.25 MG Oral TID First Dose Now for 60 Days           ???   **OLANZAPINE (ZYPREXA)** 10 MG Oral QHS for 60 Days           ???   **QUETIAPINE FUMARATE (SEROQUEL)** 300 MG Oral QHS for 60 Days           ???   **MIRTAZAPINE (REMERON)** 15 MG Oral QHS for 60 Days           ???   **CARBAMAZEPINE (TEGRETOL)** 600 MG Orogastric Tube QPM for 60 Days           ???   **GUAIFENESIN SYRUP** 200 MG Oral Q4H First Dose Now           ???   **CARBAMAZEPINE (TEGRETOL)**  400 MG Orogastric Tube QAM for 60 Days           ???   **DOCUSATE SODIUM** 100 MG Nasogastric BID for 60 Days           ???   **HYDRALAZINE (APRESOLINE)** 10 MG Intravenous Q6HPRN SBP > 180 mmHg for 60 Days, Clinician Dir:IV PUSH FOR ADULTS PER IV GUIDELINE           ???   ** ** ** **INSULIN REGULAR HUMAN (HUMULIN R)******** Sliding Scale Subcutaneous Q6H for 60 Days      For Glucose 80 to 150 Give 0 Units    For  Glucose 151 to 200 Give 2 Units    For Glucose 201 to 250 Give 3 Units    For Glucose 251 to 300 Give 4 Units    Notify Physician if: Call HO for glucose > 300          ???   **BISACODYL (DULCOLAX)** 10 MG Rectal QDAYPRN Constipation for 60 Days, Clinician Dir:NOT RELIEVED BY PO           ???   **SODIUM CL 0.9% FOR INH (STOCK) (SODIUM CHLORIDE NEB(STOCK))** 3 ML Inhalation Q4H for 60 Days           ???   **SENNA (SENOKOT)** 17.2 MG Oral QDAY for 60 Days           ???   **ACETAMINOPHEN (TYLENOL)** 650 MG Oral Q6HPRN Pain, Fever, Clinician Dir:NOT TO EXCEED 4 G APAP PER DAY           ???   **TAMSULOSIN (FLOMAX)** 0.4 MG Oral QHS for 60 Days           ???   **ATORVASTATIN (LIPITOR)** 80 MG Oral QHS for 60 Days           ???   **BRIMONIDINE 0.2% OPHTH (ALPHAGAN 0.2% OPHTH)** 1 DROP Both Eyes SOL TID for 60 Days           ???   **DORZOLAMIDE 2% OPHTH (TRUSOPT 2% OPHTH)** 1 DROP Both Eyes SOL BID for 60 Days           ???   **ASPIRIN CHEWABLE** 81 MG Oral QDAY for 60 Days           ???   **IPRATROPIUM/ALBUTEROL SULFATE (DUONEB)** 3 ML Inhalation Q6H for 60 Days           ???   **LACTULOSE (CEPHULAC)** 10 G Oral QHSPRN Constipation for 60 Days           ???   **HEPARIN** 5000 UNITS Subcutaneous Q8H First Dose Now for 60 Days           ???   **BISACODYL E.C. (BISACODYL)** 10 MG Oral QDAYPRN Constipation for 60 Days           ???   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 Hodgkin preference        .            **Electronically signed by Tawni Levy, MD on 07/14/2020 14:56**

## 2020-07-04 NOTE — Progress Notes (Signed)
 Supervisory Note For Resident I performed a history and physical examination  of the patient and discussed the management with the resident. I reviewed the  resident's note and agree with the documented findings and plan of care. Yes,    Additional Notes    Sitting in a chair, feels hungry.        afebrile, 76, 116/73, 96% 2L    5.2/2.3    nad, lungs coarse, rr, abd soft, no edema        cr-2.11    wbc 6.3        asa    lipitor    budesonide    captorpil    carbamzepine    hep subq    hydral    ISS    solumderol    metop    remeron    zyprexa    seroquel    flomax        Acute hypoxemic resp failure improving.    Acute on chronic hypercapneic resp failure, acute COPD exacerbation improving.  Taper steroids, cont bronchodilators.    Acute on chronic heart failure with reduced EF, goal even, titrating ACE-I.  Cont metop and hydral.    AKI improved, hypernatremia improved, decrease free water.    Home remeron/zyprexa/seroquel, check QTC.    other issues per housestaff.            **Electronically signed by Debbe Bales, MD on 07/13/2020 19:32**

## 2020-07-04 NOTE — Progress Notes (Signed)
 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  Chest CT revealed: consolidative opacity with surrounding  patchy groundglass opacities involving the entire left lower lobe).  Hospitalization complicated by toxic-metabolic encephalopathy (ddx: sepsis vs.  uremia vs. missed psychiatric medications) and tenuous cardiopulmonary status.        The patient is being followed for ongoing assessment of oropharyngeal swallow  mechanism to determine risk of aspiration and potential to resume a PO diet.  Initial evaluation revealed an impaired oropharyngeal swallow mechanism 2/2  encephalopathy and impaired level of consciousness with recommendation to  remain NPO with consideration for establishing alternate means of nutrition,  hydration and medication.  Upon entry, patient seated upright in bed breathing  comfortably on supplemental oxygen via HFNC able to engage in basic  conversation appropriately.  PO trials administered by SLP followed by  medication administration by NSG.        **Exam:**    _Current Status:_    Communicative: verbal, paucity of speech; intelligible for basic wants/needs,  nonsensical content    Cognitive: impaired at baseline, able to follow simple commands to  participate, repetition provided as needed    Behavioral: awake/alert, pleasantly confused/resolving delirium, fluctuating  level of alertness requiring verbal cues, weak/deconditioned, passive  participation    Hearing: responsive to voice    Nutrition: NPO    Respiratory: SpO2 92% FiO2 40% RR mid 20s, no observed  distress        _Oral Motor Examination:_    Dentition: poor quality; reduced quantity    Head and neck: symmetrical orofacial structures; HFNC in place    Lingual function: protrusion midline    Labial function: symmetrical at rest and activation    Oral mucosa: dry appearing    Palate: not evaluated    Voice: low intensity, clear sounding    Cough to command: not elicited        _Clinical Swallow Evaluation_ :    Swallowing was evaluated with the following consistencies: ice chips, thin  liquids, puree, medication administration        ORAL PHASE:    Labial seal: adequate for utensil stripping and cup sip given direct  placement, minimal anterior loss of bolus    Mastication: present, functional    Intraoral residue: none observed        PHARYNGEAL PHASE:    Pharyngeal initiation: present, subjectively delayed    Laryngeal elevation: reduced to palpation    Repeated Swallows: 1-2x/swallows per bolus    Post-prandial cough or throat clearing: 1x coughing episode with thin liquids  via cup sip, none observed on remainder    Post-prandial vocal quality: clear, unchanged to phonation following coughing  episode        **Impression** : The patient presents with subtle improvements in  oropharyngeal swallow mechanism compared to previous encounter, however,  continues to present with altered mental status/fluctuating level of  alertness.  Oropharyngeal swallow mechanism c/b aforementioned deficits listed  above.  Given patient's clinical presentation and ongoing evidence of  oropharyngeal  dysphagia, the patient remains at a heightened risk for  aspiration and subsequent medical complications; resuming a PO diet remains  contraindicated at this time.  Consider re-establishing alternate means of  nutrition, hydration and medication at the discretion of primary team.  It is  reasonable to allow ice chips, sips of water and essential medication with  puree as tolerated while maintaining aspiration precautions to provide  oral  comfort, strengthen pharyngeal musculature and prevent additional non-use  atrophy.  Will continue to follow for ongoing assessment of oropharyngeal  swallow mechanism to determine ability to safely resume a PO diet.        **Recommendations** :    1\. NPO except ice chips, sips of water and essential medication with puree as  tolerated              -  Upright 90 degrees          -  Only offer when awake/alert and engaging          -  Ensure stable respiratory status prior to PO intake          -  Withhold given overt s/s of aspiration or decline in mental/respiratory status      2\. Consider re-establishing alternate means of nutrition, hydration and  medication at the discretion of primary team    3\. Oral care with moisture 2-3x/shift    4\. Will follow 3-5x/week for ongoing assessment of oropharyngeal swallow  mechanism        Case discussed with patient and NSG; primary team paged with recommendations.        Please page with questions or concerns.        Orlinda Blalock, MS, CCC-SLP    Department of Speech-Language Pathology    Pager: [phone] or [facility] Text                **Electronically signed by Orlinda Blalock, CCC-SLP on 07/08/2020 13:35**

## 2020-07-04 NOTE — Progress Notes (Signed)
**Subjective**    Reason for Referral AKI.    Subjective Better, going to floor    Comfortable, sitting ir airmchair, no SOB    Kidney function improving    Afebrile    Balance net +3L.    **Exam**    Vital Signs    07/13/2020 16:24              ???  O2 Amount: 0.5 LPM          ???  Morse Fall Risk Total: 75      07/13/2020 15:34              ???  Temperature: 36.4 (35-37.8Cel)          ???  Site: Oral          ???  Heart Rate: 82 (60-90)          ???  Site: Monitor          ???  BP: 117/79 (90-140/60-90)          ???  Site: Left Arm          ???  Position: Lying          ???  Method: Automated          ???  Mean Arterial Pressure1: 91 (60)          ???  Respirations: 15 (12-20)          ???  O2 Saturation (%): 99          ???  O2 Delivery Method: Nasal Cannula          ???  MEWS Vital Sign Score: 0      07/12/2020 03:39              ???  Weight: 64.3kg      CFS Vital Signs CS    07/13/2020 22:59              ???  Shift Intake Total: 60ml          ???  Shift Output Total: 0ml          ???  Shift Balance: 60ml      07/13/2020 13:00              ???  Pain: 0          ???  Riker: 4 - Calm, awakens easily,follows commands          ???  Heart Rate: 81          ???  Cardiac Rhythm: NSR - Normal Sinus Rhythm          ???  BP Systolic: 115          ???  BP Diastolic: 77          ???  MAP: 89          ???  BP Site: Right Arm          ???  BP Position: Sitting          ???  BP Source: NIBP          ???  Respirations: 21          ???  SPO2 %: 93 (89-100)          ???  O2 Amount: 0.5 LPM          ???  Mode: Nasal Cannula          ???  Call  Bell Within Reach: Yes          ???  High Fall Risk Interventions: Yes          ???  Repositioned Side: In Chair          ???  Enteral Feeding: Nutren 1.0          ???  Enteral Feeding Rate: 70cc/hr          ???  IV Infusion 1: Normal Saline (KVO/Meds)          ???  IV Dose 1: HOLD          ???  ml/hour 1: HOLD      07/13/2020 12:00              ???  Temp Calc: 36.80          ???  Temp Site:  Oral      07/13/2020 06:00              ???  HOB Elevation: 30      07/12/2020 13:00              ???  BP Systolic-2: 159          ???  BP Diastolic-2: 72          ???  MAP-2: 95          ???  Site-2: Right Arm          ???  Position-2: Sitting          ???  Source-2: Arterial      07/12/2020 10:00              ???  IV Infusion 2: Dexmedetomidine 471mcg/100ml          ???  IV Dose 2: off          ???  ml/hour 2: off      07/12/2020 09:00              ???  Posterior Tibial: Right - Palpable,Left - Palpable          ???  Dorsalis Pedis: Right - Palpable,Left - Palpable          ???  Radial: Right - Palpable,Left - Palpable      07/12/2020 04:00              ???  Oral Hygiene Q4: Yes          Comment Vitals per Soarian.        Gen: in NAD, comfortable in chair at bedside    HEENT: DHT in place, nasal cannula    Cardio: RRR, no MRGs    Lungs: normal WOB, bilateral crackles at the bases L>R    Abd: ND, NT    Ext: WWP, no pitting edema    Skin: darkening of bilateral shins ?venous stasis.    **Assessment and Plan**    Medication              ???   **PREDNISONE (DELTASONE)** 40 MG Oral ONE TIME for 1 Doses           ???   **FUROSEMIDE (LASIX)** 40 MG Oral ONE TIME for 1 Doses           ???   **POLYETHYLENE GLYCOL (MIRALAX)** 17 G Oral BID for 60 Days           ???   **HYDRALAZINE (APRESOLINE)** 50 MG Oral TID for 59 Days           ???   **  METOPROLOL (LOPRESSOR)** 6.25 MG Oral QID for 60 Days           ???   **CAPTOPRIL (CAPOTEN)** 6.25 MG Oral TID First Dose Now for 60 Days           ???   **OLANZAPINE (ZYPREXA)** 10 MG Oral QHS for 60 Days           ???   **QUETIAPINE FUMARATE (SEROQUEL)** 300 MG Oral QHS for 60 Days           ???   **MIRTAZAPINE (REMERON)** 15 MG Oral QHS for 60 Days           ???   **CARBAMAZEPINE (TEGRETOL)** 600 MG Orogastric Tube QPM for 60 Days           ???   **GUAIFENESIN SYRUP** 200 MG Oral Q4H First Dose Now           ???   **CARBAMAZEPINE (TEGRETOL)** 400 MG Orogastric Tube QAM for 60 Days            ???   **DOCUSATE SODIUM** 100 MG Nasogastric BID for 60 Days           ???   **HYDRALAZINE (APRESOLINE)** 10 MG Intravenous Q6HPRN SBP > 180 mmHg for 60 Days, Clinician Dir:IV PUSH FOR ADULTS PER IV GUIDELINE           ???   ** ** ** **INSULIN REGULAR HUMAN (HUMULIN R)******** Sliding Scale Subcutaneous Q6H for 60 Days      For Glucose 80 to 150 Give 0 Units    For Glucose 151 to 200 Give 2 Units    For Glucose 201 to 250 Give 3 Units    For Glucose 251 to 300 Give 4 Units    Notify Physician if: Call HO for glucose > 300          ???   **BISACODYL (DULCOLAX)** 10 MG Rectal QDAYPRN Constipation for 60 Days, Clinician Dir:NOT RELIEVED BY PO           ???   **SODIUM CL 0.9% FOR INH (STOCK) (SODIUM CHLORIDE NEB(STOCK))** 3 ML Inhalation Q4H for 60 Days           ???   **SENNA (SENOKOT)** 17.2 MG Oral QDAY for 60 Days           ???   **ACETAMINOPHEN (TYLENOL)** 650 MG Oral Q6HPRN Pain, Fever, Clinician Dir:NOT TO EXCEED 4 G APAP PER DAY           ???   **TAMSULOSIN (FLOMAX)** 0.4 MG Oral QHS for 60 Days           ???   **ATORVASTATIN (LIPITOR)** 80 MG Oral QHS for 60 Days           ???   **BRIMONIDINE 0.2% OPHTH (ALPHAGAN 0.2% OPHTH)** 1 DROP Both Eyes SOL TID for 60 Days           ???   **DORZOLAMIDE 2% OPHTH (TRUSOPT 2% OPHTH)** 1 DROP Both Eyes SOL BID for 60 Days           ???   **ASPIRIN CHEWABLE** 81 MG Oral QDAY for 60 Days           ???   **IPRATROPIUM/ALBUTEROL SULFATE (DUONEB)** 3 ML Inhalation Q6H for 60 Days           ???   **LACTULOSE (CEPHULAC)** 10 G Oral QHSPRN Constipation for 60 Days           ???   **  HEPARIN** 5000 UNITS Subcutaneous Q8H First Dose Now for 60 Days           ???   **BISACODYL E.C. (BISACODYL)** 10 MG Oral QDAYPRN Constipation for 60 Days           ???   **BUDESONIDE INHALATION (PULMICORT RESPULES)** 1 MG Inhalation BID First Dose Now for 60 Days           Nephrology Assessment and Plan Comment 55 year old man with HFREF (LVEF 20-25%  and severely reduced RV function), severe TR,  COPD (FEV1 24%, FEV1/FVC 40%)  not on home O2, DM2, HTN, and Schizoaffective disorder, who is admitted with  CAP, COPD exacerbation, acute decompensated heart failure, and AKI.        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 Now improving    - he was very positive in the last 24h, would target even to mildly negative  balance    .            **Electronically signed by Tawni Levy, MD on 07/13/2020 16:55**

## 2020-07-04 NOTE — Progress Notes (Signed)
 Supervisory Note For Fellow I performed a history and physical examination of  the patient and discussed the management with the fellows. I reviewed the  fellow's note and agree with the documented findings and plan of care. Yes.            **Electronically signed by Margret Chance, MD on 07/09/2020 17:49**

## 2020-07-04 NOTE — Progress Notes (Signed)
 Supervisory Note For Resident I performed a history and physical examination  of the patient and discussed the management with the resident. I reviewed the  resident's note and agree with the documented findings and plan of care. Yes,    Additional Notes    Sitting in a chair, feels better.        afebrile, 78, 117/79, 18, 98% 2L    2.6/2.4    nad, lungs with diminshed air movement, rr, abd soft, no edema        Cr 2.05        asa    lipitor    budesonide    captorpil    carbamazepine    hep subq    hydral    lantus    ISS    duonebs    metop    remeron    olanzipine    seroquel    flomax    pred 40        Acute hypoxemic resp failure improving.    Acute on chronic hypercapneic resp failure, acute COPD exacerbation improving.  Taper steroids, cont bronchodilators.    Acute on chronic heart failure with reduced EF, goal even,resume home lasix  and follow I/O. titrating ACE-I up, and will start decreasing hydral. Cont  metop.    AKI improved, hypernatremia improved, decrease free water.    Home remeron/zyprexa/seroquel, last QTC 477.    speech to see again to asses swallowing.    other issues per housestaff.            **Electronically signed by Debbe Bales, MD on 07/14/2020 12:00**

## 2020-07-05 LAB — HX CHEM-PANELS
HX ANION GAP: 88 (ref 3–14)
HX ANION GAP: 88 (ref 3–14)
HX ANION GAP: 1010 (ref 3–14)

## 2020-07-05 LAB — HX DIABETES

## 2020-07-05 LAB — HX CHEM-OTHER

## 2020-07-05 LAB — HX HEM-ROUTINE

## 2020-07-05 LAB — HX POINT OF CARE

## 2020-07-05 LAB — HX CHEM-LFT

## 2020-07-05 LAB — HX MICRO

## 2020-07-05 LAB — HX CHEM-ENZ-FRAC

## 2020-07-06 LAB — HX CHEM-OTHER

## 2020-07-06 LAB — HX CHEM-ENZ-FRAC

## 2020-07-06 LAB — HX CHEM-BLOODGAS

## 2020-07-06 LAB — HX HEM-ROUTINE

## 2020-07-06 LAB — HX TOXICOLOGY-TDM

## 2020-07-06 LAB — HX CHEM-PANELS
HX ANION GAP: 1111 (ref 3–14)
HX ANION GAP: 1010 (ref 3–14)
HX ANION GAP: 1111 (ref 3–14)

## 2020-07-06 LAB — HX DIABETES

## 2020-07-06 LAB — HX POINT OF CARE

## 2020-07-06 LAB — HX BF-CHEM/URINE

## 2020-07-06 LAB — HX TRANSFUSION
HX ABO-RH INTERPRETATION (GEL): O POS
HX ANTIBODY SCREEN (GEL): NEGATIVE

## 2020-07-07 LAB — HX HEM-ROUTINE

## 2020-07-07 LAB — HX COAGULATION

## 2020-07-07 LAB — HX CHEM-PANELS
HX ANION GAP: 1010 (ref 3–14)
HX ANION GAP: 1313 (ref 3–14)
HX ANION GAP: 1111 (ref 3–14)

## 2020-07-07 LAB — HX CHEM-OTHER

## 2020-07-07 LAB — HX CHEM-LFT

## 2020-07-07 LAB — HX DIABETES

## 2020-07-07 LAB — HX POINT OF CARE

## 2020-07-07 LAB — HX TRANSFUSION: HX ABO RH CONFIRMATORY TYPE, INSTRUMENT: O POS

## 2020-07-07 LAB — HX CHEM-METABOLIC

## 2020-07-07 LAB — HX TOXICOLOGY-TDM

## 2020-07-07 LAB — HX MICRO

## 2020-07-08 LAB — HX CHEM-PANELS
HX ANION GAP: 1717 — ABNORMAL HIGH (ref 3–14)
HX ANION GAP: 1010 (ref 3–14)
HX ANION GAP: 1515 — ABNORMAL HIGH (ref 3–14)

## 2020-07-08 LAB — HX HEM-ROUTINE

## 2020-07-08 LAB — HX CHEM-OTHER

## 2020-07-08 LAB — HX COAGULATION

## 2020-07-08 LAB — HX CHEM-LFT

## 2020-07-08 LAB — HX POINT OF CARE

## 2020-07-08 LAB — HX DIABETES

## 2020-07-09 LAB — HX CHEM-BLOODGAS
HX BASE EXCESS: 13.6 mmol/L
HX BASE EXCESS: 14.2 mmol/L
HX BASE EXCESS: 13.1 mmol/L
HX SOURCE OF SAMPLE: 1000673219910010000000

## 2020-07-09 LAB — HX CHEM-PANELS
HX ANION GAP: 1212 (ref 3–14)
HX ANION GAP: 1313 (ref 3–14)
HX ANION GAP: 1212 (ref 3–14)

## 2020-07-09 LAB — HX HEM-ROUTINE

## 2020-07-09 LAB — HX DIABETES

## 2020-07-09 LAB — HX CHEM-LFT

## 2020-07-09 LAB — HX POINT OF CARE

## 2020-07-09 LAB — HX CHEM-OTHER

## 2020-07-09 LAB — HX MICRO

## 2020-07-10 LAB — HX CHEM-PANELS
HX ANION GAP: 1313 (ref 3–14)
HX ANION GAP: 1717 — ABNORMAL HIGH (ref 3–14)

## 2020-07-10 LAB — HX CHEM-BLOODGAS
HX BASE EXCESS: 11.5 mmol/L
HX BASE EXCESS: 11.8 mmol/L
HX BASE EXCESS: 6.9 mmol/L
HX BASE EXCESS: 4.7 mmol/L
HX FIO2: 3030

## 2020-07-10 LAB — HX HEM-ROUTINE

## 2020-07-10 LAB — HX CHEM-METABOLIC

## 2020-07-10 LAB — HX CHEM-OTHER

## 2020-07-10 LAB — HX CHEM-LFT

## 2020-07-10 LAB — HX COAGULATION

## 2020-07-10 LAB — HX POINT OF CARE

## 2020-07-10 LAB — HX MICRO

## 2020-07-10 LAB — HX CHEM-VITAMIN

## 2020-07-10 LAB — HX DIABETES

## 2020-07-11 LAB — HX HEM-ROUTINE

## 2020-07-11 LAB — HX CHEM-OTHER

## 2020-07-11 LAB — HX CHEM-PANELS
HX ANION GAP: 1616 — ABNORMAL HIGH (ref 3–14)
HX SODIUM (NA): 148 meq/L — ABNORMAL HIGH (ref 135–145)

## 2020-07-11 LAB — HX CHEM-LFT

## 2020-07-11 LAB — HX POINT OF CARE
HX GLUCOSE-POCT: 155 mg/dL — ABNORMAL HIGH (ref 70–139)
HX GLUCOSE-POCT: 98 mg/dL (ref 70–139)
HX GLUCOSE-POCT: 201 mg/dL — ABNORMAL HIGH (ref 70–139)

## 2020-07-11 LAB — HX DIABETES

## 2020-07-11 LAB — HX CHEM-BLOODGAS: HX BASE EXCESS: 7.6 mmol/L

## 2020-07-12 LAB — HX HEM-ROUTINE
HX BASO #: 0 10*3/uL (ref 0.0–0.2)
HX BASO: 1 %
HX EOSIN #: 0 10*3/uL (ref 0.0–0.5)
HX EOSIN: 0 %
HX HCT: 49.4 % — ABNORMAL HIGH (ref 37.0–47.0)
HX HGB: 15.4 g/dL (ref 13.5–16.0)
HX IMMATURE GRANULOCYTE#: 0.1 10*3/uL (ref 0.0–0.1)
HX IMMATURE GRANULOCYTE: 2 %
HX LYMPH #: 0.9 10*3/uL — ABNORMAL LOW (ref 1.0–4.0)
HX LYMPH: 15 %
HX MCH: 30 pg (ref 26.0–34.0)
HX MCHC: 31.2 g/dL — ABNORMAL LOW (ref 32.0–36.0)
HX MCV: 96.1 fL (ref 80.0–98.0)
HX MONO #: 0.7 10*3/uL (ref 0.2–0.8)
HX MONO: 11 %
HX MPV: 9.4 fL (ref 9.1–11.7)
HX NEUT #: 4.6 10*3/uL (ref 1.5–7.5)
HX NRBC #: 0 10*3/uL
HX NUCLEATED RBC: 0 %
HX PLT: 235 10*3/uL (ref 150–400)
HX RBC BLOOD COUNT: 5.14 M/uL (ref 4.20–5.50)
HX RDW: 13.2 % (ref 11.5–14.5)
HX SEG NEUT: 72 %
HX WBC: 6.4 10*3/uL (ref 4.0–11.0)

## 2020-07-12 LAB — HX CHEM-PANELS
HX 2021 EGFRCR: 23 mL/min/{1.73_m2} — ABNORMAL LOW (ref 60–999)
HX 2021 EGFRCR: 26 mL/min/{1.73_m2} — ABNORMAL LOW (ref 60–999)
HX ANION GAP: 11 (ref 3–14)
HX ANION GAP: 10 (ref 3–14)
HX BLOOD UREA NITROGEN: 65 mg/dL — ABNORMAL HIGH (ref 6–24)
HX BLOOD UREA NITROGEN: 65 mg/dL — ABNORMAL HIGH (ref 6–24)
HX CHLORIDE (CL): 103 meq/L (ref 98–110)
HX CHLORIDE (CL): 101 meq/L (ref 98–110)
HX CO2: 34 meq/L — ABNORMAL HIGH (ref 20–30)
HX CO2: 33 meq/L — ABNORMAL HIGH (ref 20–30)
HX CREATININE (CR): 3.04 mg/dL — ABNORMAL HIGH (ref 0.57–1.30)
HX CREATININE (CR): 2.73 mg/dL — ABNORMAL HIGH (ref 0.57–1.30)
HX GFR, AFRICAN AMERICAN: 25 mL/min/{1.73_m2} — ABNORMAL LOW
HX GFR, AFRICAN AMERICAN: 29 mL/min/{1.73_m2} — ABNORMAL LOW
HX GFR, NON-AFRICAN AMERICAN: 22 mL/min/{1.73_m2} — ABNORMAL LOW
HX GFR, NON-AFRICAN AMERICAN: 25 mL/min/{1.73_m2} — ABNORMAL LOW
HX GLUCOSE: 196 mg/dL — ABNORMAL HIGH (ref 70–139)
HX GLUCOSE: 277 mg/dL — ABNORMAL HIGH (ref 70–139)
HX POTASSIUM (K): 4 meq/L (ref 3.6–5.1)
HX POTASSIUM (K): 4.2 meq/L (ref 3.6–5.1)
HX SODIUM (NA): 148 meq/L — ABNORMAL HIGH (ref 135–145)
HX SODIUM (NA): 144 meq/L (ref 135–145)

## 2020-07-12 LAB — HX CHEM-BLOODGAS
HX BASE EXCESS: 9 mmol/L
HX BICARBONATE: 34 meq/L — ABNORMAL HIGH (ref 21–28)
HX CALCULATED CO2: 29 meq/L (ref 17–32)
HX CARBOXYHEMOGLOBIN: 1.5 % (ref 0.0–3.0)
HX METHEMOGLOBIN: 0.7 % (ref 0.0–1.8)
HX OXYGEN SATURATION, MEASURED (SVO2): 94.8 % — ABNORMAL LOW (ref 95.0–98.0)
HX PCO2: 60 mmHg — ABNORMAL HIGH (ref 35–45)
HX PH: 7.38 (ref 7.35–7.45)
HX PO2: 81 mmHg — ABNORMAL LOW (ref 85–105)

## 2020-07-12 LAB — HX CHEM-OTHER
HX CALCIUM (CA): 9.5 mg/dL (ref 8.5–10.5)
HX CALCIUM (CA): 9.4 mg/dL (ref 8.5–10.5)
HX MAGNESIUM: 2.4 mg/dL (ref 1.6–2.6)
HX MAGNESIUM: 2.2 mg/dL (ref 1.6–2.6)
HX PHOSPHORUS: 3 mg/dL (ref 2.7–4.5)
HX PHOSPHORUS: 3.1 mg/dL (ref 2.7–4.5)

## 2020-07-12 LAB — HX POINT OF CARE
HX GLUCOSE-POCT: 160 mg/dL — ABNORMAL HIGH (ref 70–139)
HX GLUCOSE-POCT: 214 mg/dL — ABNORMAL HIGH (ref 70–139)
HX GLUCOSE-POCT: 134 mg/dL (ref 70–139)

## 2020-07-12 LAB — HX DIABETES
HX GLUCOSE: 196 mg/dL — ABNORMAL HIGH (ref 70–139)
HX GLUCOSE: 277 mg/dL — ABNORMAL HIGH (ref 70–139)

## 2020-07-13 LAB — HX HEM-ROUTINE
HX BASO #: 0 10*3/uL (ref 0.0–0.2)
HX BASO: 1 %
HX EOSIN #: 0.2 10*3/uL (ref 0.0–0.5)
HX EOSIN: 2 %
HX HCT: 49.4 % — ABNORMAL HIGH (ref 37.0–47.0)
HX HGB: 15.4 g/dL (ref 13.5–16.0)
HX IMMATURE GRANULOCYTE#: 0.1 10*3/uL (ref 0.0–0.1)
HX IMMATURE GRANULOCYTE: 2 %
HX LYMPH #: 1 10*3/uL (ref 1.0–4.0)
HX LYMPH: 17 %
HX MCH: 30.2 pg (ref 26.0–34.0)
HX MCHC: 31.2 g/dL — ABNORMAL LOW (ref 32.0–36.0)
HX MCV: 96.9 fL (ref 80.0–98.0)
HX MONO #: 0.8 10*3/uL (ref 0.2–0.8)
HX MONO: 12 %
HX MPV: 9.7 fL (ref 9.1–11.7)
HX NEUT #: 4.1 10*3/uL (ref 1.5–7.5)
HX NRBC #: 0 10*3/uL
HX NUCLEATED RBC: 0 %
HX PLT: 208 10*3/uL (ref 150–400)
HX RBC BLOOD COUNT: 5.1 M/uL (ref 4.20–5.50)
HX RDW: 13.2 % (ref 11.5–14.5)
HX SEG NEUT: 66 %
HX WBC: 6.3 10*3/uL (ref 4.0–11.0)

## 2020-07-13 LAB — HX POINT OF CARE
HX GLUCOSE-POCT: 122 mg/dL (ref 70–139)
HX GLUCOSE-POCT: 155 mg/dL — ABNORMAL HIGH (ref 70–139)
HX GLUCOSE-POCT: 156 mg/dL — ABNORMAL HIGH (ref 70–139)
HX GLUCOSE-POCT: 169 mg/dL — ABNORMAL HIGH (ref 70–139)
HX GLUCOSE-POCT: 205 mg/dL — ABNORMAL HIGH (ref 70–139)

## 2020-07-13 LAB — HX CHEM-PANELS
HX 2021 EGFRCR: 36 mL/min/{1.73_m2} — ABNORMAL LOW (ref 60–999)
HX ANION GAP: 12 (ref 3–14)
HX ANION GAP: 13 (ref 3–14)
HX BLOOD UREA NITROGEN: 58 mg/dL — ABNORMAL HIGH (ref 6–24)
HX CHLORIDE (CL): 102 meq/L (ref 98–110)
HX CHLORIDE (CL): 96 meq/L — ABNORMAL LOW (ref 98–110)
HX CO2: 27 meq/L (ref 20–30)
HX CO2: 30 meq/L (ref 20–30)
HX CREATININE (CR): 2.11 mg/dL — ABNORMAL HIGH (ref 0.57–1.30)
HX GFR, AFRICAN AMERICAN: 39 mL/min/{1.73_m2} — ABNORMAL LOW
HX GFR, NON-AFRICAN AMERICAN: 34 mL/min/{1.73_m2} — ABNORMAL LOW
HX GLUCOSE: 210 mg/dL — ABNORMAL HIGH (ref 70–139)
HX POTASSIUM (K): 3.9 meq/L (ref 3.6–5.1)
HX POTASSIUM (K): 3.9 meq/L (ref 3.6–5.1)
HX SODIUM (NA): 141 meq/L (ref 135–145)
HX SODIUM (NA): 139 meq/L (ref 135–145)

## 2020-07-13 LAB — HX CHEM-OTHER
HX CALCIUM (CA): 8.7 mg/dL (ref 8.5–10.5)
HX MAGNESIUM: 2 mg/dL (ref 1.6–2.6)
HX MAGNESIUM: 2.1 mg/dL (ref 1.6–2.6)
HX PHOSPHORUS: 4.2 mg/dL (ref 2.7–4.5)

## 2020-07-13 LAB — HX DIABETES: HX GLUCOSE: 210 mg/dL — ABNORMAL HIGH (ref 70–139)

## 2020-07-14 LAB — HX HEM-ROUTINE
HX BASO #: 0 10*3/uL (ref 0.0–0.2)
HX BASO: 1 %
HX EOSIN #: 0.1 10*3/uL (ref 0.0–0.5)
HX EOSIN: 1 %
HX HCT: 52.7 % — ABNORMAL HIGH (ref 37.0–47.0)
HX HGB: 16.7 g/dL — ABNORMAL HIGH (ref 13.5–16.0)
HX IMMATURE GRANULOCYTE#: 0.1 10*3/uL (ref 0.0–0.1)
HX IMMATURE GRANULOCYTE: 1 %
HX LYMPH #: 1.2 10*3/uL (ref 1.0–4.0)
HX LYMPH: 14 %
HX MCH: 30.4 pg (ref 26.0–34.0)
HX MCHC: 31.7 g/dL — ABNORMAL LOW (ref 32.0–36.0)
HX MCV: 95.8 fL (ref 80.0–98.0)
HX MONO #: 0.8 10*3/uL (ref 0.2–0.8)
HX MONO: 10 %
HX MPV: 10 fL (ref 9.1–11.7)
HX NEUT #: 6.2 10*3/uL (ref 1.5–7.5)
HX NRBC #: 0 10*3/uL
HX NUCLEATED RBC: 0 %
HX PLT: 224 10*3/uL (ref 150–400)
HX RBC BLOOD COUNT: 5.5 M/uL (ref 4.20–5.50)
HX RDW: 13.1 % (ref 11.5–14.5)
HX SEG NEUT: 73 %
HX WBC: 8.4 10*3/uL (ref 4.0–11.0)

## 2020-07-14 LAB — HX CHEM-PANELS
HX 2021 EGFRCR: 37 mL/min/{1.73_m2} — ABNORMAL LOW (ref 60–999)
HX ANION GAP: 14 (ref 3–14)
HX BLOOD UREA NITROGEN: 56 mg/dL — ABNORMAL HIGH (ref 6–24)
HX CHLORIDE (CL): 97 meq/L — ABNORMAL LOW (ref 98–110)
HX CO2: 27 meq/L (ref 20–30)
HX CREATININE (CR): 2.05 mg/dL — ABNORMAL HIGH (ref 0.57–1.30)
HX GFR, AFRICAN AMERICAN: 41 mL/min/{1.73_m2} — ABNORMAL LOW
HX GFR, NON-AFRICAN AMERICAN: 35 mL/min/{1.73_m2} — ABNORMAL LOW
HX GLUCOSE: 199 mg/dL — ABNORMAL HIGH (ref 70–139)
HX POTASSIUM (K): 4.8 meq/L (ref 3.6–5.1)
HX SODIUM (NA): 138 meq/L (ref 135–145)

## 2020-07-14 LAB — HX CHEM-OTHER
HX CALCIUM (CA): 9 mg/dL (ref 8.5–10.5)
HX MAGNESIUM: 2.2 mg/dL (ref 1.6–2.6)
HX PHOSPHORUS: 4.1 mg/dL (ref 2.7–4.5)

## 2020-07-14 LAB — HX POINT OF CARE
HX GLUCOSE-POCT: 160 mg/dL — ABNORMAL HIGH (ref 70–139)
HX GLUCOSE-POCT: 167 mg/dL — ABNORMAL HIGH (ref 70–139)
HX GLUCOSE-POCT: 177 mg/dL — ABNORMAL HIGH (ref 70–139)
HX GLUCOSE-POCT: 200 mg/dL — ABNORMAL HIGH (ref 70–139)

## 2020-07-14 LAB — HX DIABETES: HX GLUCOSE: 199 mg/dL — ABNORMAL HIGH (ref 70–139)

## 2020-07-15 LAB — HX POINT OF CARE
HX GLUCOSE-POCT: 124 mg/dL (ref 70–139)
HX GLUCOSE-POCT: 253 mg/dL — ABNORMAL HIGH (ref 70–139)
HX GLUCOSE-POCT: 224 mg/dL — ABNORMAL HIGH (ref 70–139)
HX GLUCOSE-POCT: 199 mg/dL — ABNORMAL HIGH (ref 70–139)

## 2020-07-15 LAB — HX CHEM-OTHER
HX CALCIUM (CA): 9.4 mg/dL (ref 8.5–10.5)
HX CALCIUM (CA): 9.2 mg/dL (ref 8.5–10.5)
HX CALCIUM (CA): 8.6 mg/dL (ref 8.5–10.5)
HX MAGNESIUM: 2.6 mg/dL (ref 1.6–2.6)
HX MAGNESIUM: 2.5 mg/dL (ref 1.6–2.6)
HX MAGNESIUM: 2.4 mg/dL (ref 1.6–2.6)
HX PHOSPHORUS: 4.1 mg/dL (ref 2.7–4.5)
HX PHOSPHORUS: 4.6 mg/dL — ABNORMAL HIGH (ref 2.7–4.5)

## 2020-07-15 LAB — HX HEM-ROUTINE
HX BASO #: 0.1 10*3/uL (ref 0.0–0.2)
HX BASO: 1 %
HX EOSIN #: 0.1 10*3/uL (ref 0.0–0.5)
HX EOSIN: 1 %
HX HCT: 52.5 % — ABNORMAL HIGH (ref 37.0–47.0)
HX HGB: 17 g/dL — ABNORMAL HIGH (ref 13.5–16.0)
HX IMMATURE GRANULOCYTE#: 0.1 10*3/uL (ref 0.0–0.1)
HX IMMATURE GRANULOCYTE: 2 %
HX LYMPH #: 1.3 10*3/uL (ref 1.0–4.0)
HX LYMPH: 15 %
HX MCH: 30.7 pg (ref 26.0–34.0)
HX MCHC: 32.4 g/dL (ref 32.0–36.0)
HX MCV: 94.8 fL (ref 80.0–98.0)
HX MONO #: 0.8 10*3/uL (ref 0.2–0.8)
HX MONO: 10 %
HX MPV: 10.1 fL (ref 9.1–11.7)
HX NEUT #: 6.1 10*3/uL (ref 1.5–7.5)
HX NRBC #: 0 10*3/uL
HX NUCLEATED RBC: 0 %
HX PLT: 223 10*3/uL (ref 150–400)
HX RBC BLOOD COUNT: 5.54 M/uL — ABNORMAL HIGH (ref 4.20–5.50)
HX RDW: 13.1 % (ref 11.5–14.5)
HX SEG NEUT: 72 %
HX WBC: 8.5 10*3/uL (ref 4.0–11.0)

## 2020-07-15 LAB — HX CHEM-PANELS
HX 2021 EGFRCR: 36 mL/min/{1.73_m2} — ABNORMAL LOW (ref 60–999)
HX 2021 EGFRCR: 34 mL/min/{1.73_m2} — ABNORMAL LOW (ref 60–999)
HX ANION GAP: 12 (ref 3–14)
HX ANION GAP: 14 (ref 3–14)
HX ANION GAP: 11 (ref 3–14)
HX BLOOD UREA NITROGEN: 61 mg/dL — ABNORMAL HIGH (ref 6–24)
HX CHLORIDE (CL): 95 meq/L — ABNORMAL LOW (ref 98–110)
HX CHLORIDE (CL): 93 meq/L — ABNORMAL LOW (ref 98–110)
HX CHLORIDE (CL): 93 meq/L — ABNORMAL LOW (ref 98–110)
HX CO2: 29 meq/L (ref 20–30)
HX CO2: 28 meq/L (ref 20–30)
HX CO2: 29 meq/L (ref 20–30)
HX CREATININE (CR): 2.11 mg/dL — ABNORMAL HIGH (ref 0.57–1.30)
HX CREATININE (CR): 2.21 mg/dL — ABNORMAL HIGH (ref 0.57–1.30)
HX GFR, AFRICAN AMERICAN: 39 mL/min/{1.73_m2} — ABNORMAL LOW
HX GFR, AFRICAN AMERICAN: 37 mL/min/{1.73_m2} — ABNORMAL LOW
HX GFR, NON-AFRICAN AMERICAN: 34 mL/min/{1.73_m2} — ABNORMAL LOW
HX GFR, NON-AFRICAN AMERICAN: 32 mL/min/{1.73_m2} — ABNORMAL LOW
HX GLUCOSE: 123 mg/dL (ref 70–139)
HX POTASSIUM (K): 5.2 meq/L — ABNORMAL HIGH (ref 3.6–5.1)
HX POTASSIUM (K): 6.3 meq/L — AB (ref 3.6–5.1)
HX POTASSIUM (K): 5 meq/L (ref 3.6–5.1)
HX SODIUM (NA): 136 meq/L (ref 135–145)
HX SODIUM (NA): 135 meq/L (ref 135–145)
HX SODIUM (NA): 133 meq/L — ABNORMAL LOW (ref 135–145)

## 2020-07-15 LAB — HX DIABETES: HX GLUCOSE: 123 mg/dL (ref 70–139)

## 2020-07-16 ENCOUNTER — Ambulatory Visit (HOSPITAL_BASED_OUTPATIENT_CLINIC_OR_DEPARTMENT_OTHER): Admitting: Internal Medicine

## 2020-07-16 LAB — HX DIABETES
HX GLUCOSE: 213 mg/dL — ABNORMAL HIGH (ref 70–139)
HX GLUCOSE: 238 mg/dL — ABNORMAL HIGH (ref 70–139)

## 2020-07-16 LAB — HX HEM-ROUTINE
HX BASO #: 0 10*3/uL (ref 0.0–0.2)
HX BASO: 1 %
HX EOSIN #: 0.1 10*3/uL (ref 0.0–0.5)
HX EOSIN: 1 %
HX HCT: 48.4 % — ABNORMAL HIGH (ref 37.0–47.0)
HX HGB: 15.6 g/dL (ref 13.5–16.0)
HX IMMATURE GRANULOCYTE#: 0.2 10*3/uL — ABNORMAL HIGH (ref 0.0–0.1)
HX IMMATURE GRANULOCYTE: 2 %
HX LYMPH #: 0.9 10*3/uL — ABNORMAL LOW (ref 1.0–4.0)
HX LYMPH: 11 %
HX MCH: 30.6 pg (ref 26.0–34.0)
HX MCHC: 32.2 g/dL (ref 32.0–36.0)
HX MCV: 95.1 fL (ref 80.0–98.0)
HX MONO #: 0.8 10*3/uL (ref 0.2–0.8)
HX MONO: 10 %
HX MPV: 10.4 fL (ref 9.1–11.7)
HX NEUT #: 6.3 10*3/uL (ref 1.5–7.5)
HX NRBC #: 0 10*3/uL
HX NUCLEATED RBC: 0 %
HX PLT: 210 10*3/uL (ref 150–400)
HX RBC BLOOD COUNT: 5.09 M/uL (ref 4.20–5.50)
HX RDW: 13.1 % (ref 11.5–14.5)
HX SEG NEUT: 76 %
HX WBC: 8.3 10*3/uL (ref 4.0–11.0)

## 2020-07-16 LAB — HX CHEM-PANELS
HX 2021 EGFRCR: 36 mL/min/{1.73_m2} — ABNORMAL LOW (ref 60–999)
HX 2021 EGFRCR: 38 mL/min/{1.73_m2} — ABNORMAL LOW (ref 60–999)
HX ANION GAP: 12 (ref 3–14)
HX ANION GAP: 13 (ref 3–14)
HX BLOOD UREA NITROGEN: 57 mg/dL — ABNORMAL HIGH (ref 6–24)
HX BLOOD UREA NITROGEN: 63 mg/dL — ABNORMAL HIGH (ref 6–24)
HX CHLORIDE (CL): 94 meq/L — ABNORMAL LOW (ref 98–110)
HX CHLORIDE (CL): 97 meq/L — ABNORMAL LOW (ref 98–110)
HX CO2: 25 meq/L (ref 20–30)
HX CO2: 27 meq/L (ref 20–30)
HX CREATININE (CR): 2.02 mg/dL — ABNORMAL HIGH (ref 0.57–1.30)
HX CREATININE (CR): 2.13 mg/dL — ABNORMAL HIGH (ref 0.57–1.30)
HX GFR, AFRICAN AMERICAN: 39 mL/min/{1.73_m2} — ABNORMAL LOW
HX GFR, AFRICAN AMERICAN: 41 mL/min/{1.73_m2} — ABNORMAL LOW
HX GFR, NON-AFRICAN AMERICAN: 33 mL/min/{1.73_m2} — ABNORMAL LOW
HX GFR, NON-AFRICAN AMERICAN: 36 mL/min/{1.73_m2} — ABNORMAL LOW
HX GLUCOSE: 213 mg/dL — ABNORMAL HIGH (ref 70–139)
HX GLUCOSE: 238 mg/dL — ABNORMAL HIGH (ref 70–139)
HX POTASSIUM (K): 4.3 meq/L (ref 3.6–5.1)
HX POTASSIUM (K): 4.3 meq/L (ref 3.6–5.1)
HX SODIUM (NA): 134 meq/L — ABNORMAL LOW (ref 135–145)
HX SODIUM (NA): 134 meq/L — ABNORMAL LOW (ref 135–145)

## 2020-07-16 LAB — HX CHEM-OTHER
HX CALCIUM (CA): 8.5 mg/dL (ref 8.5–10.5)
HX CALCIUM (CA): 8.5 mg/dL (ref 8.5–10.5)
HX MAGNESIUM: 2.5 mg/dL (ref 1.6–2.6)
HX MAGNESIUM: 2.5 mg/dL (ref 1.6–2.6)
HX OSMOLALITY, SERUM: 312 mosm/kg — ABNORMAL HIGH (ref 278–303)
HX PHOSPHORUS: 4.5 mg/dL (ref 2.7–4.5)
HX PHOSPHORUS: 4.6 mg/dL — ABNORMAL HIGH (ref 2.7–4.5)

## 2020-07-16 LAB — HX BF-CHEM/URINE
HX OSMOLALITY, URINE: 183 mosm/kg (ref 50–1300)
HX SODIUM, RANDOM URINE: 28 meq/L

## 2020-07-16 LAB — HX POINT OF CARE: HX GLUCOSE-POCT: 223 mg/dL — ABNORMAL HIGH (ref 70–139)

## 2020-07-16 MED FILL — *METOPROLOL SUCC ER 50 MG T: 30 days supply | Qty: 30 | Fill #0 | Status: AC

## 2020-07-16 MED FILL — predniSONE 10 MG TABS: 4 days supply | Qty: 13 | Fill #0 | Status: AC

## 2020-07-17 ENCOUNTER — Telehealth: Payer: Self-pay

## 2020-07-17 NOTE — Telephone Encounter (Signed)
[facility] Patient Product/process development scientist completed.    The patient is insured through [facility] and has a $20.00 copay.  We will continue to follow to see if copay assistance is needed.  Clearance Coots, CPhT Specialty Pharmacy Patient [facility] Phone: [phone] Fax:  [phone]

## 2020-07-22 LAB — MICROALBUMIN URINE (EXT)
Creatinine, urine, random (INT/EXT): 28 mg/dL — ABNORMAL LOW (ref 30.0–125.0)
MICROALBUMIN, URINE (EXT): 692 mg/L — ABNORMAL HIGH (ref 2.0–20.0)
Microalbumin/Creatinine Ratio Urine (EXT): 2471.4 mg/g — ABNORMAL HIGH (ref 1.3–30.0)

## 2020-07-24 ENCOUNTER — Other Ambulatory Visit: Payer: Self-pay

## 2020-07-24 ENCOUNTER — Ambulatory Visit (INDEPENDENT_AMBULATORY_CARE_PROVIDER_SITE_OTHER): Payer: Commercial Managed Care - PPO | Admitting: Internal Medicine

## 2020-07-24 VITALS — BP 136/93 | HR 72 | Temp 98.5°F | Resp 16 | Ht 66.0 in | Wt 203.0 lb

## 2020-07-24 DIAGNOSIS — B181 Chronic viral hepatitis B without delta-agent: Secondary | ICD-10-CM | POA: Diagnosis not present

## 2020-07-24 NOTE — Patient Instructions (Signed)
You likely have chronic hepatitis B   This can not be cured, but suppressed via medication. Treatment is only initiated in certain situations.  Blood tests today to determine if you need to get treated   Abdominal ultrasound will also be done to make sure you have no liver cancer from hepatitis   Please make sure your children get tested as well  Avoid sharing shaver, nail clippers, toothbrush

## 2020-07-24 NOTE — Progress Notes (Signed)
[facility]  Reason for Consult:chronic hep b management Referring Provider: Janeece Agee    Patient Active Problem List   Diagnosis Date Noted  . Symptomatic anemia 09/11/2016  . GIB (gastrointestinal bleeding) 09/11/2016  . Dizziness 09/11/2016      HPI: Thomas Santiago is a 55 y.o. male here for hepatitis B evaluation  Timing of diagnosis: 04/10/2020 via PCP mr Kateri Plummer. Previously haven't heard that he has it  Risk: presumed vertical transmission from [place] when he was born. He is unsure if his parents are infected...  Cirrhosis finding: no hx n/v/hematemesis, bloody/black stool, abd distension, fluid withdrawal from abd/lungs, hx confusion, fatigue, edema, weight loss, poor apetite  Extra gi manifestation: no abnormal skin rash, fatigue, diffuse joint pain/swelling, hx kidney disease    We reviewed: Natural history of hep b and its complications available treatment options Other factors potentially worsening liver disease: No alcohol use; not obese; no diabetes mellitus Avoiding excessive amount (more than 3 gram daily use) acetaminophen   Denies heavy etoh consumption Denies ivdu/indu  Review of Systems: ROS Other ros negative      Past Medical History:  Diagnosis Date  . Allergy     Social History   Tobacco Use  . Smoking status: Never Smoker  . Smokeless tobacco: Never Used  Vaping Use  . Vaping Use: Never used  Substance Use Topics  . Alcohol use: Yes    Comment: weekends 1-2 drinks  . Drug use: No    No family history on file.  No Known Allergies  OBJECTIVE: Vitals:   07/24/20 0936  BP: (!) 136/93  Pulse: 72  Resp: 16  Temp: 98.5 F (36.9 C)  TempSrc: Oral  SpO2: 98%  Weight: 203 lb (92.1 kg)  Height: 5\' 6"  (1.676 m)   Body mass index is 32.77 kg/m.   Physical Exam Constitutional:      General: He is not in acute distress.    Appearance: He is obese.  HENT:     Mouth/Throat:      Mouth: Mucous membranes are moist.     Pharynx: Oropharynx is clear.  Eyes:     Conjunctiva/sclera: Conjunctivae normal.     Pupils: Pupils are equal, round, and reactive to light.  Cardiovascular:     Rate and Rhythm: Normal rate and regular rhythm.     Pulses: Normal pulses.     Heart sounds: Normal heart sounds.  Pulmonary:     Effort: Pulmonary effort is normal.     Breath sounds: Normal breath sounds.  Abdominal:     Palpations: Abdomen is soft.  Musculoskeletal:        General: Normal range of motion.     Cervical back: Normal range of motion.  Skin:    General: Skin is warm.     Findings: No lesion.  Neurological:     General: No focal deficit present.     Mental Status: He is alert and oriented to person, place, and time.     Cranial Nerves: No cranial nerve deficit.  Psychiatric:        Mood and Affect: Mood normal.     Lab: Reviewed 04/10/2020 cr 0.75; lft wnl (alt 33); alb 4.7; cbc 6/15/248; lipid 237/46/168/129 Microbiology:  Serology: 04/10/2020 hep b cAb positive; no other hepatitis labs done  Imaging:   Assessment/plan: Problem List Items Addressed This Visit   None   Visit Diagnoses    Chronic viral hepatitis  B without delta agent and without coma (HCC)    -  Primary   Relevant Orders   Hepatitis A Ab, Total   Hepatitis C antibody   Hepatitis B surface antibody,quantitative   Hepatitis B Surface AntiGEN   Hepatitis B e antigen   Hepatitis B e antibody   Hepatitis delta antibody   US ABDOMEN COMPLETE W/ELASTOGRAPHY   Hepatitis B DNA, ultraquantitative, PCR     55 yo male [place] immigrant referred by pcp for positive hep b core antibody  We discussed natural history, pathogenesis, transmission, complication, and potential treatment of hep B  At this time will need more labs to determine if he needs treatment It is likely that he has chronic hepatitis B   His mother is without cirrhosis/liver cancer  -labs as above -elastography/liver  u-s -f/u 4 weeks -encourage testing of his biologic children -discuss need for good hygiene and avoid sharing of personal hygiene equipment     Follow-up: Return in about 4 weeks (around 08/21/2020).  I spent 60 minute reviewing data/chart, and coordinating care and >50% direct face to face time providing counseling/discussing diagnostics/treatment plan with patient  Raymondo Band, MD [facility] [phone] pager   [phone] cell 07/24/2020, 9:56 AM

## 2020-07-26 ENCOUNTER — Ambulatory Visit: Admit: 2020-07-26 | Payer: Medicare Other

## 2020-07-26 ENCOUNTER — Ambulatory Visit: Admitting: Pediatrics

## 2020-07-26 ENCOUNTER — Ambulatory Visit: Admitting: Acute Care

## 2020-07-26 ENCOUNTER — Ambulatory Visit (HOSPITAL_BASED_OUTPATIENT_CLINIC_OR_DEPARTMENT_OTHER): Admitting: Psychiatry

## 2020-07-26 NOTE — Progress Notes (Signed)
 .  Progress Notes  .  Patient: Arthur Young  Provider: Catha Nottingham    .  DOB: 09-19-1965 Age: 55 Y Sex: Male  .  PCP: Berenice Primas  NP  Date: 07/26/2020  .  --------------------------------------------------------------------------------  .  REASON FOR APPOINTMENT  .  1. HFrEF follow up, s/p hospitalization 2/24-07/16/2020  .  HISTORY OF PRESENT ILLNESS  .  GENERAL:  It was a pleasure to see Arthur Young  in the Heart Failure Clinic at [facility]. Arthur Young  is a 55 year old male a history of HFrEF, HTN, DM, COPD, and  schizoaffective disorder (paranoid). He is followed by Dr.  Augusto Gamble at [facility].05/11/19-05/19/19: Arthur Young  presented with fluid overload on exam concerning for a heart  failure exacerbation. BNP 224. TTE showed LVEF 30%, LVSF severely  reduced, RVSF moderately reduced, mild to moderate TR, RVSP  40-23mmHg. PCP records indicated the patient had EF    30% for >  10 years. Patient had reportedly been resistant to medication  changes in the past. He was diuresed and discharged on Lasix 20mg   daily. Discharge weight 77.1 kg. In addition, there was felt to  be a component of COPD exacerbation and he was started on  steroids, standing duonebs, and empiric antibiotics for possible  community-acquired pneumonia. On 05/24/19, Arthur Young presented to  the Heart Failure Clinic. He reported feeling much better. He  stated SOB, cough, and had resolved. He did report that his  current medical conditions were related to a curse. He reported  adherence with medications with the assistance with group home  staff. He weighed himself daily. 06/07/18-06/13/19: Arthur Young was  readmitted with 2 days of shortness of breath, dry cough, O2 Sat  in the 80s while at his group home, and bilateral lower extremity  edema. Labs notable for Cr 2.15 (from 1.3 on 1/13), K 6.2, Na 131  (139 on 1/13), bicarb 26, troponin 0.05, BNP 827 (286 on 1/13),  and WBC 9. Admission weight 86.4kg. He was diuresed to  a  discharge weight of 74.4kg. There was a question of medication  non-adherence. AKI was thought to be cardiorenal in the setting  of his HF exacerbation as well as progression of his renal  disease. Losartan was held. COPD exacerbation was treated with  Prednisone. The patient's H/H was 16.6/54 on admission. This was  thought to be secondary to his COPD although full workup of  primary polycythemia should be done as an outpatient (i.e. JAK2  testing, bone marrow cytology, erythropoietin level).On 06/19/19,  the patient returned to the HF Clinic. He complained of cough and  requested cough medicine (Tessalon provided). Otherwise he denied  any complaints, including chest pain, palpitations, SOB at rest,  dyspnea on exertion, orthopnea, PND, LE edema, or abdominal  distention. He was insistent he did not want new medications, nor  was he willing to have labs drawn.9/1-9/15/21: Again hospitalized  with ADHF. Initial evaluation notable for 10kg weight gain, Cr  2.04 (baseline 1.2-1.3), and BNP 1085. Medication non-adherence  was again raised. TTE showed worsening right and left sided heart  failure with an EF 20-25% and severely reduced function of the  right ventricle. He was diuresed with Lasix 100mg  IV to a dry  weight of 53.8kg. Hydralazine 50mg  TID was started. He was also  treated for COPD exacerbation. Following this visit, the patient  did not attend his post-discharge HF Clinic  appointment.2/24-07/16/20: Presented with 3 days of fevers and  dyspnea at rest and exertion. He was admitted for management of  acute hypoxic respiratory failure, pneumonia, and COPD  exacerbation. In addition, he was noted to have lower extremity  edema with 20 lb weight gain. BNP 737. TTE showed moderately  dilated left ventricle and severely reduced systolic function (EF  20-25%), severe global hypokinesis, moderate to severely dilated  RV with severely reduced systolic function, flattened septum  consistent with RV pressure/volume  overload and severe tricuspid  regurgitation. He was diuresed with a Lasix drip to a dry weight  of 65.9kg. He was initially started on captopril, subsequently  discontinued due hyperkalemia (K 6). Metoprolol was started; he  was continued to Lasix and hydralazine.Today, 07/26/20, Arthur Young  returns to the HF Clinic. He continues to express some distrust  of medications, but states he is compliant, which his group home  attendant confirms. He denies chest pain, palpitations,  lightheadedness, dizziness, dyspnea at rest or exertion, PND,  orthopnea, LE edema, or abdominal distention.  .  CURRENT MEDICATIONS  .  Taking Acetaminophen 500 MG Tablet 1 tablet as needed Orally  every 6 hrs  Taking Albuterol Sulfate 108 (90 Base) MCG/ACT Aerosol Powder  Breath Activated 2 puff as needed Inhalation every 4 hrs  Taking Aspirin 81 MG Tablet Chewable 1 tablet Orally Once a day  at bedtime  Taking Atorvastatin Calcium 80 MG Tablet 1 tablet Orally Once a  day at bedtime  Taking Brimonidine Tartrate 0.15 % Solution 1 drop to both eyes  Ophthalmic Three times a day  Taking carBAMazepine 200 MG Tablet 3 tablets Orally Once a day at  bedtime  Taking carBAMazepine 200 mg Tablet 2 tablets Orally Once a day in  the morning  Taking Docusate Sodium 100 MG Capsule 1 capsule Orally Twice a  day  Taking Dorzolamide HCl 2 % Solution 1 drop to both eyes  Ophthalmic Twice a day  Taking Furosemide 40 MG Tablet 1 tablet Orally Once a day  Taking hydrALAZINE HCl 50 MG Tablet 1 tablet with food Orally  Three times a day  Taking Lactulose 10 GM/15ML Solution 15 ml Orally Twice a day as  needed for constipation  Taking metFORMIN HCl 1000 MG Tablet 1 tablet Orally Twice a day  Taking Metoprolol Succinate ER 50 MG Tablet Extended Release 24  Hour 1 tablet Orally Once a day  Taking Mirtazapine 15 MG Tablet 1 tablet Orally Once a day at  bedtime  Taking OLANZapine 10 MG Tablet 1 tablet Orally Once a day at  bedtiime  Taking QUEtiapine Fumarate 400 MG  Tablet 1 tablet Orally Once a  day at bedtime, Notes: 400 mg bedtime  Taking Sennosides 8.6 MG Tablet 2 tablets Orally Twice a day  Taking Tamsulosin HCl 0.4 MG Capsule 1 capsule Orally Once a day  at bedtime  Taking Trelegy Ellipta 100-62.5-25 MCG/INH Aerosol Powder Breath  Activated 1 puff Inhalation Once a day  .  PAST MEDICAL HISTORY  .  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  ALLERGIES  .  Depakote  Lithium  .  FAMILY HISTORY  .  Mother: unknown  Father: unknown  1 son(s) , 1 daughter(s) .  No family history of heart or lung disease.  .  SOCIAL HISTORY  .  .  Tobaccohistory:Currently smoking Smokes 1 ppd.  .  Alcohol Denies.  .  Abuse/NeglectDo you feel unsafe in your  relationships?No , Have  you ever been hit, kicked, punched or otherwise hurt by someone  in the past year? No.  .  Smokes 1 PPD since age 46No alcohol No drug use Lives in a group  home From [place], moved to [place] about 30y back.  .  HOSPITALIZATION/MAJOR DIAGNOSTIC PROCEDURE  .  ADHF, COPD 12/31-05/19/2019  ADHF, COPD 1/28-06/13/2019  ADHF, COPD 9/1-9/15/2021  ADHF, COPD 2/24-07/16/2020  .  REVIEW OF SYSTEMS  .  CardioVascular:  .  Neurologic    no numbness, weakness, no difficulty speaking, or  paresthesias; cataracts and glaucoma . General/Constitutional     no fever, chills, night sweats, headaches, change in appetite /  weight, fatigue . Gastrointestinal    no blood in stool, nausea,  vomiting, diarrhea, or abdominal pain; + constipation .  Hematology    no easy bruising, no prolonged bleeding .  Peripheral Vascular    no claudication, no skin ulceration .  Endocrine    no polydipsia, polyphagia, hot and cold intolerance  . Skin    no rash . ENT    no nosebleeds . Cardiovascular    per  HPI . Respiratory    no wheezing, shortness of breath at rest,  sore throat, upper airway congestion, or hemoptysis; + cough .  Genitourinary    no blood in urine .  Musculoskeletal    no  arthraglias, myalgias, or muscle aches .  [name]  VITAL SIGNS  .  Pain scale 0, Ht-in 62, Wt-lbs 161, BMI 29.44, BP 102/68, HR 87,  RR 16, BSA 1.79, O2 95, Ht-cm 157.48, Wt-kg 73.03, Wt Change -4  lb.  [name]  EXAMINATION  .  Cardiac Testing:  Echo:07/05/20: EF 20-25%. LV modeately dilated (6.3 cm). LVSF  severely reduced. Severe global hypokinesis. Flattened septum is  consistent with RV pressure / volume overload. RV moderate to  severely dilated with systolic function severely reduced. LA  normal. RA severely dilated. Dilated IVC suggests increased right  atrial pressure. The interatrial septum bows toward left atrium  consistent with elevated right atrial pressure. Mild MR. Severe  TR. There is hepatic vein systolic flow reversal. There is at  least mild pulmonary HTN, but RVSP may be underestimated in the  light of significant tricuspid regurgitation. Trace AI. Mild to  moderate PVR.  .  05/15/19: EF 30%. LV mildly dilated, LVSF severely reduced. The  tissue Doppler diastolic parameters are indeterminate. Severe  global hypokinesis of the left ventricle. Flattened septum is  consistent with RV pressure overload. RV mild to moderately  dilated. RVSF moderately reduced. LA normal. RA moderately  dilated. The inter-atrial septum bows toward the left atrium  consistent with high right atrial pressure. A dilated inferior  vena cava suggests increased right atrial pressure. The mitral  valve leaflets appear mildly thickened. There is mild mitral  annular calcification. Trace MR. Mild to moderate TR. RVSP  40-14mmHg. Trace PVR.  [name]  PHYSICAL EXAMINATION  .  General Examination:  General Appearance  well developed, well nourished, gentleman no  acute distress. Head  normocephalic. Eyes  sclera non-icteric.  Neck/Thyroid  no JVD (though somewhat difficult exam due to body  habitus), no carotid bruit. Skin  no rashes. Heart  regular rate  and rhythm, normal S1, normal S2. Lungs  few upper airway  wheezes,  improve after cough; no crackles or rhonchi. Abdomen   large, soft, non tender, with bowel sounds present, no  organomegaly appreciated. Extremities  no edema, clubbing, or  cyanosis.  Peripheral Pulses  2 + radial, 2 + dorsalis pedis,  normal symmetric pulses bilaterally. Neurological  nonfocal, no  gross focal deficits.  .  ASSESSMENTS  .  Chronic HFrEF (heart failure with reduced ejection fraction) -  I50.22 (Primary)  .  Essential hypertension - I10  .  Type 2 diabetes mellitus without complication, without long-term  current use of insulin - E11.9  .  Stage 2 chronic kidney disease - N18.2  .  Hyperkalemia - E87.5  .  Arthur Young is a 55 year old male a history of HFrEF, HTN,  DM, COPD, CKD and schizoaffective disorder (paranoid). Dry weight  65.9kg. # HFrEF. NYHA II, appears euvolemic.Current regime:  Metoprolol 50mg  daily, hydralazine 50mg  TID, furosemide 40mg   daily.No spironolactone, ACE/ARB/ARNI due to hyperkalemia.Does  not have ICD. If patient agrees, would consider as well.Follows  with Dr. Augusto Gamble.# HTN.Soft BP today, but  asymptomatic.Follow.# HLD.Atorvastatin# HyperKalemia.No current  issues.# A/CKD3.h/o proteinuria.Cr peak during hospitalization  4.07, stabilized at    2.Follow up with nephrology.#  T2DM.Metformin.Thank you for the opportunity to assist in the  care of Arthur Young. Will transition care back to [facility] / Dr.  Adela Glimpse.  .  FOLLOW UP  .  prn  .  [name]  Appointment Provider: Catha Nottingham, NP  .  Electronically signed by Catha Nottingham , NP on  07/26/2020 at 05:12 PM EDT  .  Document electronically signed by Catha Nottingham    .

## 2020-07-28 LAB — HEPATITIS B E ANTIGEN: Hep B E Ag: NONREACTIVE

## 2020-07-28 LAB — HEPATITIS DELTA ANTIBODY: Hepatitis D Ab, Total: NEGATIVE

## 2020-07-28 LAB — HEPATITIS B DNA, ULTRAQUANTITATIVE, PCR
Hepatitis B DNA (Calc): <1 Log IU/mL
Hepatitis B DNA: <10 IU/mL

## 2020-07-28 LAB — HEPATITIS A ANTIBODY, TOTAL: Hepatitis A AB,Total: REACTIVE — AB

## 2020-07-28 LAB — HEPATITIS B E ANTIBODY: Hep B E Ab: NONREACTIVE

## 2020-07-28 LAB — HEPATITIS C ANTIBODY
Hepatitis C Ab: NONREACTIVE
SIGNAL TO CUT-OFF: 0.02 (ref <1.00)

## 2020-07-28 LAB — HEPATITIS B SURFACE ANTIGEN: Hepatitis B Surface Ag: NONREACTIVE

## 2020-07-28 LAB — HEPATITIS B SURFACE ANTIBODY, QUANTITATIVE: Hep B S AB Quant (Post): 37 m[IU]/mL (ref >=10)

## 2020-08-01 ENCOUNTER — Ambulatory Visit (HOSPITAL_COMMUNITY)
Admission: RE | Admit: 2020-08-01 | Discharge: 2020-08-01 | Disposition: A | Discharge status: Home / Self Care | Payer: Commercial Managed Care - PPO | Source: Ambulatory Visit | Attending: Internal Medicine | Admitting: Internal Medicine

## 2020-08-01 ENCOUNTER — Other Ambulatory Visit: Payer: Self-pay

## 2020-08-01 DIAGNOSIS — B181 Chronic viral hepatitis B without delta-agent: Secondary | ICD-10-CM | POA: Diagnosis present

## 2020-08-08 ENCOUNTER — Ambulatory Visit: Admit: 2020-08-08 | Payer: Medicare Other

## 2020-08-08 ENCOUNTER — Ambulatory Visit: Admitting: Internal Medicine

## 2020-08-08 MED ORDER — Albuterol Sulfate: 108 | inhaler | 11 refills | 0 days | Status: AC

## 2020-08-08 MED ORDER — Trelegy Ellipta: inhaler | Freq: Every day | 11 refills | 0 days | Status: AC

## 2020-08-12 ENCOUNTER — Encounter (INDEPENDENT_AMBULATORY_CARE_PROVIDER_SITE_OTHER)

## 2020-08-13 ENCOUNTER — Other Ambulatory Visit (HOSPITAL_BASED_OUTPATIENT_CLINIC_OR_DEPARTMENT_OTHER): Admitting: Internal Medicine

## 2020-08-14 ENCOUNTER — Ambulatory Visit: Payer: PRIVATE HEALTH INSURANCE | Primary: Family

## 2020-08-19 ENCOUNTER — Telehealth: Payer: Self-pay

## 2020-08-19 NOTE — Telephone Encounter (Signed)
Results relayed to patient using [employer] [facility]). Patient did inquire about wife results however due to HIPAA explained to patient that RN cannot discuss any results without approval from his wife. Patient verbalized understanding.   Patient did give permission to leave VM on phone number [phone] (wife's phone) since she may be busy at work.   Emiko Osorto Loyola Mast, RN

## 2020-08-19 NOTE — Telephone Encounter (Signed)
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 He doesn't need to follow up with [place]. Thank you

## 2020-08-20 ENCOUNTER — Other Ambulatory Visit

## 2020-08-20 ENCOUNTER — Ambulatory Visit: Admit: 2020-08-20 | Discharge: 2020-08-20 | Payer: PRIVATE HEALTH INSURANCE | Primary: Family

## 2020-08-20 VITALS — Ht 62.0 in | Wt 158.2 lb

## 2020-08-20 DIAGNOSIS — N179 Acute kidney failure, unspecified: Secondary | ICD-10-CM

## 2020-08-20 LAB — COMPREHENSIVE METABOLIC PANEL
ALT: 16 U/L (ref 0–55)
AST: 18 U/L (ref 6–42)
Albumin: 3.8 g/dL (ref 3.2–5.0)
Alkaline phosphatase: 158 U/L — ABNORMAL HIGH (ref 30–130)
Anion Gap: 11 mmol/L (ref 3–14)
BUN: 20 mg/dL (ref 6–24)
Bilirubin, total: 0.2 mg/dL (ref 0.2–1.2)
CO2 (Bicarbonate): 26 mmol/L (ref 20–32)
Calcium: 8.9 mg/dL (ref 8.5–10.5)
Chloride: 104 mmol/L (ref 98–110)
Creatinine: 1.64 mg/dL — ABNORMAL HIGH (ref 0.55–1.30)
Glucose: 59 mg/dL — ABNORMAL LOW (ref 70–139)
Potassium: 4.4 mmol/L (ref 3.6–5.2)
Protein, total: 7 g/dL (ref 6.0–8.4)
Sodium: 141 mmol/L (ref 135–146)
eGFRcr: 49 mL/min/{1.73_m2} — ABNORMAL LOW (ref >60)

## 2020-08-20 LAB — PROTEIN / CREATININE RATIO, URINE
Creatinine, urine, random (INT/EXT): 13.82 mg/dL — ABNORMAL LOW (ref 24.00–392.00)
Protein, urine: 70 mg/dL — ABNORMAL HIGH (ref <15.0)
Protein/Creatinine ratio: 5065 mg/g{creat} — ABNORMAL HIGH (ref <=200)

## 2020-08-20 LAB — PTH, INTACT: PTH: 280 pg/mL — ABNORMAL HIGH (ref 11.0–95.0)

## 2020-08-20 LAB — PHOSPHORUS: Phosphorus: 3.9 mg/dL (ref 2.4–4.9)

## 2020-08-20 LAB — ALBUMIN, URINE, RANDOM (INCLUDING CREATININE)
Albumin, urine (INT/EXT): 50 mg/dL
Albumin/Creatinine ratio: 3618 mg/g{creat} — ABNORMAL HIGH (ref <=30)
Creatinine, urine, random (INT/EXT): 13.82 mg/dL — ABNORMAL LOW (ref 24.00–392.00)

## 2020-08-20 LAB — CREATININE, URINE, RANDOM: Creatinine, urine, random (INT/EXT): 13.82 mg/dL — ABNORMAL LOW (ref 24.00–392.00)

## 2020-08-20 LAB — URIC ACID: Uric Acid: 5.8 mg/dL (ref 2.6–8.0)

## 2020-08-20 NOTE — Progress Notes (Signed)
Reason For Referral  Acute Kidney Injury    Subjective    History Of Present Illness  Arthur Young is a 55 y.o. male with PMHx HFrEF (BiV dysfunction with EF 20-25% and severely reduced RV function 05/2019), COPD (not on home O2), pulmonary nodules, HTN, Type II DM and Schizoaffective disorder presenting as new patient in renal clinic for discharge follow-up of ATN. He was admitted 2/24-3/8 for acute respiratory failure iso heart failure and pneumonia. During this admission he also developed AKI on CKD Stage G3a with peak creatinine of 4.14 from his baseline of 1.4. Urinalysis showed 3+ protein (191 spot protein, 123 spot albumin, Protein/Cr 3571, Albumin/Cr 2307) and thought his CKD is probably related to DMII and HTN. Nephrology was consulted during his admission for oliguria in the setting of his AKI and his acute kidney dysfunction was thought to be pre-renal in the setting of cardiorenal and he developed post ATN diuresis. He was given Diamox X1 for alkalosis and bicarbonate improved. His kidney function improved and his creatinine on discharge was 2.02 (eGFR 38).     Today in clinic, he feels well overall, denies new symptoms including dysuria, urinary frequency, hematuria, CP, SOB, worsening edema. Reports urinating once at night usually, feels like he has no issues with bladder emptying.      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  No family history of kidney disease     Social History  Tobaccohistory: Currently smoking Smokes 1 ppd.  Alcohol Denies  Drug use: denies     Allergies  Depakote - hives, swelling   Lithium - took for 1-2 years many years ago - reported hives, swelling    Medications  Carbamazepine 200mg  BID  Quetiapine 400mg  QHS  Metoprolol succinate 50mg  QDAY  Hydralazine 50mg  TID    Furosemide 40mg  QDAY  Tamsulosin 0.4mg  QDAY  Atorvastatin 80mg  QHS  Aspirin 81mg  QDAY  Metformin 1000mg  BID  Trelegy inhaler  Albuterol   Mirtazapine 15mg  QHS  Olanzapine 10mg  QHS  Lactulose 30mL by mouth 1-2 times per day as needed  Docusate BID  Senna BID     Review of Systems  Review of Systems - see HPI     Objective    LPhysical Exam  Vitals: BP 134/75  General: well-appearing, in NAD  HEENT: NCAT, EOMI  CV: RRR, normal S1/S2, no MRG  Lungs: CTABL, mild expiratory wheezing, no resp distress on RA  Abdomen: soft, NTND  Ext: WWP, trace edema R>L  Neuro: no FND     Labs:   UA today 1+ protein, sediment bland   Cr 1.64, eGFR 49  Ca, phos, uric acid normal  PTH high at 280  25 Vitamin D 12  Protein/Cr 5000, Albumin/Cr 3600     Assessment/Plan   Active Problems:  AKI on CKD    1. AKI on CKD  Presents today for follow-up/establishing care in renal clinic after recent admission for respiratory failure, found to have AKI on CKD thought to be 2/2 CRS and ATN. Today his kidney function seems to be improving to his baseline. He has evidence of nephrotic syndrome and per chart review has previously been on ARB but this was discontinued iso hyperkalemia. This should be re-addressed in the near  future. Etiology of his HFrEF is not clear per his prior notes (?ischemic), and no evidence of complicated DM at this point. May also consider further workup (paraproteinemia, PLA2R, complements etc).   -RTC in 2 months

## 2020-08-21 LAB — POCT URINALYSIS DIPSTICK
Bilirubin, UA, POC: NEGATIVE
Blood, UA, POC: NEGATIVE
Glucose, UA, POC: NORMAL
Ketones, UA, POC: NEGATIVE
Leukocytes, UA, POC: NEGATIVE
Nitrite, UA, POC: NEGATIVE
Spec Gravity, UA, POC: 1.01
Urobilinogen, UA, POC: NORMAL mg/dL
pH, UA, POC: 5

## 2020-08-23 LAB — VITAMIN D 25 HYDROXY
Vit D, 25-Hydroxy: 12 ng/mL — ABNORMAL LOW (ref 20–100)
Vitamin D 25-OH, D2: <2 ng/mL
Vitamin D 25-OH, D3: 12 ng/mL

## 2020-09-11 ENCOUNTER — Other Ambulatory Visit (HOSPITAL_BASED_OUTPATIENT_CLINIC_OR_DEPARTMENT_OTHER): Admitting: Internal Medicine

## 2020-09-11 ENCOUNTER — Encounter

## 2020-09-11 NOTE — Progress Notes (Signed)
CT chest order entered.    Sharyn Lull, MD

## 2020-09-27 ENCOUNTER — Encounter

## 2020-09-27 ENCOUNTER — Inpatient Hospital Stay: Admit: 2020-09-27 | Payer: PRIVATE HEALTH INSURANCE | Primary: Family

## 2020-09-27 ENCOUNTER — Other Ambulatory Visit

## 2020-09-30 DIAGNOSIS — R9389 Abnormal findings on diagnostic imaging of other specified body structures: Secondary | ICD-10-CM

## 2020-10-10 ENCOUNTER — Telehealth (HOSPITAL_BASED_OUTPATIENT_CLINIC_OR_DEPARTMENT_OTHER): Admitting: Internal Medicine

## 2020-10-10 NOTE — Telephone Encounter (Signed)
Called patient to discuss results of CT chest-I would like to proceed with PET scan given persistent 1.9cm lung nodule, active smoking history to evaluate further.     I spoken with both the patient and the staff at his group home about ensuring he makes his appt next week with me.    Sharyn Lull, MD

## 2020-10-17 ENCOUNTER — Other Ambulatory Visit

## 2020-10-17 ENCOUNTER — Ambulatory Visit
Admit: 2020-10-17 | Discharge: 2020-10-18 | Payer: PRIVATE HEALTH INSURANCE | Attending: Internal Medicine | Primary: Family

## 2020-10-17 VITALS — BP 119/76 | HR 95 | Temp 97.8°F | Resp 16 | Ht 63.5 in | Wt 160.0 lb

## 2020-10-17 DIAGNOSIS — J432 Centrilobular emphysema: Secondary | ICD-10-CM

## 2020-10-17 MED ORDER — fluticasone-umeclidin-vilanter (Trelegy Ellipta) 100-62.5-25 mcg blister with device
100-62.5-25 | Freq: Every day | RESPIRATORY_TRACT | 11 refills | Status: AC
Start: 2020-10-17 — End: 2021-09-02

## 2020-10-17 NOTE — Progress Notes (Signed)
PULMONARY CLINIC NOTE       Today's Date: 10/18/2020  MRN: [id number]  Name: Arthur Young  DOB: 12/26/1965    Lung nodule, COPD    History Of Present Illness  Arthur Young is a 55 y.o. male with a history of HFrEF, HTN, DM, COPD, and schizoaffective disorder (paranoid) presenting to follow-up COPD and lung nodules.    At his last visit I obtained a CT chest which showed a persistent 1.9 cm nodule/region of atelectasis in the left lower lobe.  Today he reports that his breathing is stable, he will sometimes experience dyspnea on exertion after walking long distances but overall feels well.  He reports being adherent to his inhalers-Trelegy and has not needed rescue inhaler use recently.  Throughout his visit he perseverated on the fact that all of his lung issues were due to 'a curse and breathing poisonous fumes'. He perservserated on people using chemical weapons causing his COPD throughout his appt.     He was admitted in February 2022 for left lower lobe pneumonia for which he received antibiotics.  He did not have a COPD exacerbation at this time and was continued on his home inhalers.  Follow-up CT chest shows resolving left lower lobe pneumonia with a residual 1.9 cm nodule/region of atelectasis that was noted on CT back in September 2021.      He continues to smoke 1PPD, he is not interested in quitting.      Intubations: None    Prednisone course: Last in 2021       Past Medical History  No past medical history on file.     Surgical History  No past surgical history on file.      Social History  1PPD smoking history   No alcohol or drug use   Living at group home     Family History   Patient declined to provied     Allergies  None     Medications  Current Outpatient Medications   Medication Instructions   ? albuterol 90 mcg/actuation inhaler 2 puffs, inhalation   ? aspirin 81 mg, oral   ? atorvastatin (LIPITOR) 80 mg, oral   ? docusate sodium 100 mg, oral, 2 times daily   ? fluticasone-umeclidin-vilanter (Trelegy  Ellipta) 100-62.5-25 mcg blister with device 1 puff, inhalation, Daily RT   ? FUROSEMIDE 1 MG/ML- CHLOROTHIAZIDE 5 MG/ML IN D5W 40 mg, oral, Daily RT   ? metoprolol succinate XL (Toprol-XL) 50 mg 24 hr tablet TAKE ONE TABLET BY MOUTH ONCE DAILY   ? mirtazapine (REMERON SOL-TAB) 15 mg, oral   ? OLANZapine (ZYPREXA) 10 mg, oral   ? QUEtiapine (SEROQUEL) 400 mg, oral         ROS:  Constitutional: no fever/chills or weight loss  Eyes: no loss of vision/blurred vision  HENT: no nosebleeds  Respiratory: as per HPI  Cardiovascular: no chest pain or palpitations  Gastrointestinal: no GERD, diarrhea or constipation  Musculoskeletal: no arthralgias or myalgias  Neurologic: no headaches or dizziness  Psychological: no anxiety or depression  Skin: no easy bruising or rash    Physical Exam:    BP 119/76 (BP Location: Left arm, Patient Position: Sitting, BP Cuff Size: Adult)   Pulse 95   Temp 36.6 ?C (97.8 ?F) (Temporal)   Resp 16   Ht 1.613 m   Wt 72.6 kg   SpO2 97% Comment: RA  BMI 27.90 kg/m?     General: Alert and oriented x3, no apparent distress  Oropharynx: no cobblestoning, no erythema or PND  Nasal Mucosa: moist, pink, normal turbinates  Neck: no JVD, no palpable LAD, no stridor  Lungs: no wheezes, rhonchi or rales, no accessory muscle use  Heart: normal s1/s2, regular rate and rythym  Gastrointestinal: non tender, non-distended, + bowel sounds  Vascular: palpable pulses, warm extremities  Extremities: no edema or clubbing  Skin: no rashes or lesions       Relevant Results  CXR: Left lower lobe infiltrates, Stable cardiomegaly and right lateral chest  wall pleural thickening associated with multiple old rib fractures.    PFTs: very severe obstruction with BD responsiveness    TTE: EF 30%, The tissue Doppler diastolic parameters are indeterminate. There  is severe global hypokinesis of the left ventricle. Flattened septum is  consistent with RV pressure overload, Right ventricle is mild to moderately  dilated with  moderately reduced function. TAPSE is 1.0cm. TDI S' is 7.7cm/s.  The right atrium is moderately dilated. The inter-atrial septum bows toward  the left    atrium consistent with high right atrial pressure. A dilated inferior vena  cava suggests increased right atrial pressure. The lack of respiratory  variation in the inferior vena cava diameter is noted.      CT chest 5/21: LLL 1.9cm nocule vs atelectasis    CT chest 09/08/19: Apically predominant centrilobular emphysema and diffuse  mild patchy groundglass opacity, consistent with chronic airspace disease.    2\. Multifocal tree-in-bud nodularity with several foci of groundglass opacity  in the lateral left upper lobe, likely representing a inflammatory or  infectious process. Short interval follow-up to resolution or stability with  repeat CT of the chest in 3-6 months is recommended.    3\. Multiple sub-6 mm solid and subsolid pulmonary nodules. Many of these  likely reflect mucous impaction/inflammatory changes, though attention on  follow-up imaging is recommended.    4\. Moderate bronchiectasis confined to the left lower lobe, with associated  nodular scarring versus atelectasis, likely reflecting sequelae of prior  aspiration/inflammation.         Assessment/Plan     Problem List Items Addressed This Visit    None     Visit Diagnoses     Centrilobular emphysema (CMS/HCC)    -  Primary    Relevant Medications    albuterol 90 mcg/actuation inhaler    fluticasone-umeclidin-vilanter (Trelegy Ellipta) 100-62.5-25 mcg blister with device    Other Relevant Orders    Complete Pulmonary Function Testing    Current smoker        Relevant Medications    fluticasone-umeclidin-vilanter (Trelegy Ellipta) 100-62.5-25 mcg blister with device    Lung nodule        Relevant Orders    CT CHEST WO CONTRAST    Pulmonary hypertension (CMS/HCC)            Arthur Young is a 55 y.o. male with a history of HFrEF, HTN, DM, COPD, and schizoaffective disorder (paranoid) presenting to  follow-up COPD and lung nodules.     #COPD: Gold stage 4/D, currently with well-controlled symptoms and no COPD exacerbations this year even though he has had a left lower lobe pneumonia.  Recommend continuing Trelegy inhaler and albuterol as needed.  I have offered him pulmonary rehab however he is not interested in this at present.  He is not hypoxemic at rest on room air and therefore does not qualify for home O2.  He is not interested in quitting smoking, counseled extensively.    #Lung  nodule: Noted to have a 1.9 cm persistent lung nodule in the left lower lobe versus atelectasis.  I have recommended a PET scan as the next step for further evaluation however he is not interested in undergoing any further testing for this.  After much discussion and counseling he finally agreed to obtaining 'only 1 more CT scan' and has agreed to obtain this in 6 months.  I have explained the risks of potentially undiagnosed lung cancer to him given his current smoking status and the risk of death by not pursuing further diagnostic investigation however he has declined the above and understands the risks.  He insists that his pulmonary conditions are due to "toxic fumes "" and not due to cigarette smoking.  He stated several times that he had "died several times already and was not concerned about dying again".   -CT chest in 6 months.  -Patient declined PET scan as above    #Pulmonary hypertension: Likely due to [facility] group 2/3.  He has declined obtaining an echo at today's visit but has agreed to revisit an annual echo the next time we meet.    #Current smoker: Counseled extensively regarding smoking cessation however patient is not interested in quitting.      RTC in 4 months    Sharyn Lull, MD

## 2020-10-22 ENCOUNTER — Ambulatory Visit
Payer: PRIVATE HEALTH INSURANCE | Attending: Student in an Organized Health Care Education/Training Program | Primary: Family

## 2020-11-01 ENCOUNTER — Other Ambulatory Visit (HOSPITAL_BASED_OUTPATIENT_CLINIC_OR_DEPARTMENT_OTHER)

## 2020-11-08 ENCOUNTER — Other Ambulatory Visit (HOSPITAL_BASED_OUTPATIENT_CLINIC_OR_DEPARTMENT_OTHER)

## 2021-02-13 ENCOUNTER — Other Ambulatory Visit

## 2021-02-13 ENCOUNTER — Encounter

## 2021-02-13 ENCOUNTER — Ambulatory Visit: Payer: PRIVATE HEALTH INSURANCE | Primary: Family

## 2021-02-13 ENCOUNTER — Encounter: Payer: PRIVATE HEALTH INSURANCE | Primary: Family

## 2021-02-13 DIAGNOSIS — J432 Centrilobular emphysema: Secondary | ICD-10-CM

## 2021-02-13 DIAGNOSIS — R911 Solitary pulmonary nodule: Secondary | ICD-10-CM

## 2021-02-20 ENCOUNTER — Ambulatory Visit: Payer: PRIVATE HEALTH INSURANCE | Attending: Internal Medicine | Primary: Family

## 2021-02-20 LAB — COMPLETE PULMONARY FUNCTION TESTING
DLCO Actual Pre-BD (Uncorrected): 11.32 mL/min/mmHg
DLCO Actual Pre-BD: 11.32 mL/min/mmHg
DLCO Pre-BD % of Predicted (Uncorrected): 51 %
DLCO Pre-BD % of Predicted: 52 %
FEV! Pre-BD % of Predicted: 52 %
FEV1 Actual Pre-BD: 1.48 L
FEV1/FVC Actual Pre-BD: 54 %
FVC Actual Pre-BD: 2.73 L
FVC Pre-BD % of Predicted: 77 %
RV Actual Pre-BD: 2.36 L
RV Pre-BD % of Predicted: 146 %
RV/TLC Actual Pre-BD: 51 %
TLC Actual Pre-BD: 4.61 L
TLC Pre-BD % of Predicted: 85 %

## 2021-02-27 ENCOUNTER — Ambulatory Visit: Payer: PRIVATE HEALTH INSURANCE | Attending: Internal Medicine | Primary: Family

## 2021-04-10 ENCOUNTER — Ambulatory Visit: Payer: PRIVATE HEALTH INSURANCE | Attending: Internal Medicine | Primary: Family

## 2021-04-10 NOTE — Unmapped (Incomplete)
PULMONARY CLINIC NOTE       Today's Date: 04/10/2021  MRN: [id number]  Name: Arthur Young  DOB: 01-03-1966    Lung nodule, COPD    History Of Present Illness  Mishael Haran is a 55 y.o. male with a history of HFrEF, HTN, DM, COPD, and schizoaffective disorder (paranoid) presenting to follow-up COPD and lung nodules.    At his last visit I obtained a CT chest which showed a persistent 1.9 cm nodule/region of atelectasis in the left lower lobe.  Today he reports that his breathing is stable, he will sometimes experience dyspnea on exertion after walking long distances but overall feels well.  He reports being adherent to his inhalers-Trelegy and has not needed rescue inhaler use recently.  Throughout his visit he perseverated on the fact that all of his lung issues were due to 'a curse and breathing poisonous fumes'. He perservserated on people using chemical weapons causing his COPD throughout his appt.     He was admitted in February 2022 for left lower lobe pneumonia for which he received antibiotics.  He did not have a COPD exacerbation at this time and was continued on his home inhalers.  Follow-up CT chest shows resolving left lower lobe pneumonia with a residual 1.9 cm nodule/region of atelectasis that was noted on CT back in September 2021.      He continues to smoke 1PPD, he is not interested in quitting.      Intubations: None    Prednisone course: Last in 2021       Past Medical History  No past medical history on file.     Surgical History  No past surgical history on file.      Social History  1PPD smoking history   No alcohol or drug use   Living at group home     Family History   Patient declined to provied     Allergies  None     Medications  Current Outpatient Medications   Medication Instructions   ? albuterol 90 mcg/actuation inhaler 2 puffs, inhalation   ? aspirin 81 mg, oral   ? atorvastatin (LIPITOR) 80 mg, oral   ? docusate sodium 100 mg, oral, 2 times daily   ? fluticasone-umeclidin-vilanter (Trelegy  Ellipta) 100-62.5-25 mcg blister with device 1 puff, inhalation, Daily RT   ? FUROSEMIDE 1 MG/ML- CHLOROTHIAZIDE 5 MG/ML IN D5W 40 mg, oral, Daily RT   ? metoprolol succinate XL (Toprol-XL) 50 mg 24 hr tablet TAKE ONE TABLET BY MOUTH ONCE DAILY   ? mirtazapine (REMERON SOL-TAB) 15 mg, oral   ? OLANZapine (ZYPREXA) 10 mg, oral   ? QUEtiapine (SEROQUEL) 400 mg, oral         ROS:  Constitutional: no fever/chills or weight loss  Eyes: no loss of vision/blurred vision  HENT: no nosebleeds  Respiratory: as per HPI  Cardiovascular: no chest pain or palpitations  Gastrointestinal: no GERD, diarrhea or constipation  Musculoskeletal: no arthralgias or myalgias  Neurologic: no headaches or dizziness  Psychological: no anxiety or depression  Skin: no easy bruising or rash    Physical Exam:    There were no vitals taken for this visit.    General: Alert and oriented x3, no apparent distress  Oropharynx: no cobblestoning, no erythema or PND  Nasal Mucosa: moist, pink, normal turbinates  Neck: no JVD, no palpable LAD, no stridor  Lungs: no wheezes, rhonchi or rales, no accessory muscle use  Heart: normal s1/s2, regular rate and rythym  Gastrointestinal: non tender, non-distended, + bowel sounds  Vascular: palpable pulses, warm extremities  Extremities: no edema or clubbing  Skin: no rashes or lesions       Relevant Results  CXR: Left lower lobe infiltrates, Stable cardiomegaly and right lateral chest  wall pleural thickening associated with multiple old rib fractures.    PFTs: very severe obstruction with BD responsiveness    TTE: EF 30%, The tissue Doppler diastolic parameters are indeterminate. There  is severe global hypokinesis of the left ventricle. Flattened septum is  consistent with RV pressure overload, Right ventricle is mild to moderately  dilated with moderately reduced function. TAPSE is 1.0cm. TDI S' is 7.7cm/s.  The right atrium is moderately dilated. The inter-atrial septum bows toward  the left    atrium  consistent with high right atrial pressure. A dilated inferior vena  cava suggests increased right atrial pressure. The lack of respiratory  variation in the inferior vena cava diameter is noted.      CT chest 5/21: LLL 1.9cm nocule vs atelectasis    CT chest 09/08/19: Apically predominant centrilobular emphysema and diffuse  mild patchy groundglass opacity, consistent with chronic airspace disease.    2\. Multifocal tree-in-bud nodularity with several foci of groundglass opacity  in the lateral left upper lobe, likely representing a inflammatory or  infectious process. Short interval follow-up to resolution or stability with  repeat CT of the chest in 3-6 months is recommended.    3\. Multiple sub-6 mm solid and subsolid pulmonary nodules. Many of these  likely reflect mucous impaction/inflammatory changes, though attention on  follow-up imaging is recommended.    4\. Moderate bronchiectasis confined to the left lower lobe, with associated  nodular scarring versus atelectasis, likely reflecting sequelae of prior  aspiration/inflammation.      CT chest 02/2021:  There are increased subpleural reticular opacities at the left lower lobe base in the area of prior consolidation and 1.9 cm nodular opacity, likely representing developing scarring. There is associated posterior segment left lower lobe mild cystic bronchiectasis.  ?  There is no new focal consolidation, pleural effusion, or pneumothorax.  ?  There are multiple sub-6 mm solid pulmonary nodules scattered throughout the lungs bilaterally, overall similar in size and distribution compared to prior. No new suspicious pulmonary nodules are identified.     Assessment/Plan     Problem List Items Addressed This Visit    None    Arthur Young is a 55 y.o. male with a history of HFrEF, HTN, DM, COPD, and schizoaffective disorder (paranoid) presenting to follow-up COPD and lung nodules.     #COPD: Gold stage 4/D, currently with well-controlled symptoms and no COPD  exacerbations this year even though he has had a left lower lobe pneumonia.  Recommend continuing Trelegy inhaler and albuterol as needed.  I have offered him pulmonary rehab however he is not interested in this at present.  He is not hypoxemic at rest on room air and therefore does not qualify for home O2.  He is not interested in quitting smoking, counseled extensively.    #Lung nodule: Noted to have a 1.9 cm persistent lung nodule in the left lower lobe versus atelectasis.  I have recommended a PET scan as the next step for further evaluation however he is not interested in undergoing any further testing for this.  After much discussion and counseling he finally agreed to obtaining 'only 1 more CT scan' and has agreed to obtain this in 6 months.  I  have explained the risks of potentially undiagnosed lung cancer to him given his current smoking status and the risk of death by not pursuing further diagnostic investigation however he has declined the above and understands the risks.  He insists that his pulmonary conditions are due to "toxic fumes "" and not due to cigarette smoking.  He stated several times that he had "died several times already and was not concerned about dying again".   -CT chest in 1 year   -Patient declined PET scan as above    #Pulmonary hypertension: Likely due to [facility] group 2/3.  He has declined obtaining an echo at today's visit but has agreed to revisit an annual echo the next time we meet.    #Current smoker: Counseled extensively regarding smoking cessation however patient is not interested in quitting.      RTC in 4 months    Sharyn Lull, MD

## 2021-07-28 LAB — LIPID PROFILE (EXT)
Cholesterol (EXT): 136 mg/dL (ref 0–200)
HDL Cholesterol (EXT): 35 mg/dL — ABNORMAL LOW (ref 40–60)
LDL Cholesterol (EXT): 64.2 mg/dL (ref 0.0–99.0)
Triglycerides (EXT): 221 mg/dL — ABNORMAL HIGH (ref 15–150)

## 2021-07-28 LAB — MICROALBUMIN URINE (EXT)
Creatinine, urine, random (INT/EXT): 16 mg/dL — ABNORMAL LOW (ref 30.0–125.0)
MICROALBUMIN, URINE (EXT): 471.3 mg/L — ABNORMAL HIGH (ref 2.0–20.0)
Microalbumin/Creatinine Ratio Urine (EXT): 2945.6 mg/g — ABNORMAL HIGH (ref 1.3–30.0)

## 2021-08-19 ENCOUNTER — Ambulatory Visit: Attending: Physician Assistant | Primary: Family

## 2021-08-19 NOTE — Progress Notes (Deleted)
Primary Care Physician: Berenice Primas     PATIENT IDENTIFIER: Arthur Young is a 56 y.o. male with CKD stage g3a/a3 and a past medica history of HFrEF (BiV dysfunction with ejection fraction of 20-25% and severely reduced right ventricular function in 05/2019), COPD (not on home O2), pulmonary nodules, hypertension, pulmonary hypertension, benign prostatic hypertrophy with lower urinary tract symptoms, type 2 diabetes mellitus, and schizoaffective disorder.  He presented as a new patient in the renal clinic on 08/20/20 for discharge follow-up of ATN. He was admitted from 07/04/20-07/16/20 for acute respiratory failure secondary to heart failure and pneumonia. During that admission, he also developed AKI on CKD stage g3a with a peak creatinine of 4.14 mg/dL from his baseline of 1.4 mg/dL. Urinalysis at the time showed 3+ protein (191 spot protein, 123 spot albumin, Protein/Cr 3571 mg/g, Albumin/Cr 2307 mg/g) and thought his CKD is probably related to type 2 diabetes mellitus and hypertension. Nephrology was consulted during his admission for oliguria in the setting of his AKI, and his acute kidney dysfunction was thought to be pre-renal in the setting of cardiorenal syndrome and he developed post-ATN diuresis. He was given Diamox x1 for alkalosis and bicarbonate improved. His kidney function improved and his creatinine on discharge was 2.02 mg/dL (eGFR 38). He has not had his renal function checked since his visit one year prior.     Subjective    INTERVAL HISTORY:   [facility] as a new patient on 08/20/2020.      Since his last visit, Danielle Lento reports:     {Prob/Med/Allergy/Histories/ROS (Optional):30015}    Objective    PHYSICAL EXAM:   There were no vitals filed for this visit.     Physical Exam    LABORATORY DATA:  {CKD Labs:26854:::1}    Labs Ordered Recently:   No visits with results within 1 Week(s) from this visit.   Latest known visit with results is:   Hospital  Outpatient Visit on 02/13/2021   Component Date Value   . FVC Actual Pre-BD 02/13/2021 2.73    . FVC Pre-BD % of Predicted 02/13/2021 77    . FEV1 Actual Pre-BD 02/13/2021 1.48    . FEV! Pre-BD % of Predict* 02/13/2021 52    . FEV1/FVC Actual Pre-BD 02/13/2021 54    . DLCO Actual Pre-BD 02/13/2021 11.32    . DLCO Pre-BD % of Predict* 02/13/2021 52    . DLCO Actual Pre-BD (Unco* 02/13/2021 11.32    . DLCO Pre-BD % of Predict* 02/13/2021 51    . TLC Actual Pre-BD 02/13/2021 4.61    . TLC Pre-BD % of Predicted 02/13/2021 85    . RV Actual Pre-BD 02/13/2021 2.36    . RV Pre-BD % of Predicted 02/13/2021 146    . RV/TLC Actual Pre-BD 02/13/2021 51         The ASCVD Risk score (Arnett DK, et al., 2019) failed to calculate for the following reasons:    The patient has a prior MI or stroke diagnosis    Assessment/Plan    No problem-specific Assessment & Plan notes found for this encounter.

## 2021-08-26 ENCOUNTER — Ambulatory Visit: Attending: Physician Assistant | Primary: Family

## 2021-09-02 ENCOUNTER — Ambulatory Visit: Admit: 2021-09-02 | Discharge: 2021-09-02 | Attending: Physician Assistant | Primary: Family

## 2021-09-02 DIAGNOSIS — N1831 Chronic kidney disease, stage 3a: Secondary | ICD-10-CM

## 2021-09-02 DIAGNOSIS — N1832 Chronic kidney disease, stage 3b: Secondary | ICD-10-CM

## 2021-09-02 LAB — FREE LIGHT CHAINS (KAPPA, LAMBDA)
Free Kappa/Lambda ratio: 0.46 (ref 0.22–1.74)
Kappa Free Light Chains: 92.5 mg/L — ABNORMAL HIGH (ref 3.3–19.4)
Lambda Free Light Chains: 199 mg/L — ABNORMAL HIGH (ref 5.7–26.3)

## 2021-09-02 LAB — PROTEIN / CREATININE RATIO, URINE
Creatinine, urine, random (INT/EXT): 15.1 mg/dL — ABNORMAL LOW (ref 24.00–392.00)
Protein, urine: 52.3 mg/dL — ABNORMAL HIGH (ref <15.0)
Protein/Creatinine ratio: 3464 mg/g{creat} — ABNORMAL HIGH (ref <=200)

## 2021-09-02 LAB — CBC WITH DIFFERENTIAL
Basophils %: 0.9 %
Basophils Absolute: 0.11 10*3/uL (ref 0.00–0.22)
Eosinophils %: 1.9 %
Eosinophils Absolute: 0.22 10*3/uL (ref 0.00–0.50)
Hematocrit: 47.2 % (ref 37.0–53.0)
Hemoglobin: 15.2 g/dL (ref 13.0–17.5)
Immature Granulocytes %: 0.7 %
Immature Granulocytes Absolute: 0.08 10*3/uL (ref 0.00–0.10)
Lymphocyte %: 19.9 %
Lymphocytes Absolute: 2.31 10*3/uL (ref 0.70–4.00)
MCH: 31 pg (ref 26.0–34.0)
MCHC: 32.2 g/dL (ref 31.0–37.0)
MCV: 96.3 fL (ref 80.0–100.0)
MPV: 9.3 fL (ref 9.1–12.4)
Monocytes %: 6.3 %
Monocytes Absolute: 0.73 10*3/uL (ref 0.38–0.83)
NRBC %: 0 % (ref 0.0–0.0)
NRBC Absolute: 0 10*3/uL (ref 0.00–2.00)
Neutrophil %: 70.3 %
Neutrophils Absolute: 8.13 10*3/uL — ABNORMAL HIGH (ref 1.50–7.95)
Platelets: 288 10*3/uL (ref 150–400)
RBC: 4.9 M/uL (ref 4.20–5.90)
RDW-CV: 13.1 % (ref 11.5–14.5)
RDW-SD: 46.7 fL (ref 35.0–51.0)
WBC: 11.6 10*3/uL — ABNORMAL HIGH (ref 4.0–11.0)

## 2021-09-02 LAB — POCT URINALYSIS DIPSTICK
Bilirubin, UA, POC: NEGATIVE
Blood, UA, POC: NEGATIVE
Glucose, UA, POC: NORMAL
Ketones, UA, POC: NEGATIVE
Leukocytes, UA, POC: NEGATIVE
Nitrite, UA, POC: NEGATIVE
Spec Gravity, UA, POC: <=1.005
Urobilinogen, UA, POC: NORMAL mg/dL
pH, UA, POC: 6

## 2021-09-02 LAB — RENAL FUNCTION PANEL
Albumin: 4.1 g/dL (ref 3.2–5.0)
Anion Gap: 11 mmol/L (ref 3–14)
BUN: 13 mg/dL (ref 6–24)
CO2 (Bicarbonate): 25 mmol/L (ref 20–32)
Calcium: 9 mg/dL (ref 8.5–10.5)
Chloride: 102 mmol/L (ref 98–110)
Creatinine: 1.63 mg/dL — ABNORMAL HIGH (ref 0.55–1.30)
Glucose: 98 mg/dL (ref 70–139)
Phosphorus: 3 mg/dL (ref 2.4–4.9)
Potassium: 4.2 mmol/L (ref 3.6–5.2)
Sodium: 138 mmol/L (ref 135–146)
eGFRcr: 49 mL/min/{1.73_m2} — ABNORMAL LOW (ref >=60)

## 2021-09-02 LAB — PROTEIN, TOTAL: Protein, total: 7.4 g/dL (ref 6.0–8.4)

## 2021-09-02 LAB — CYSTATIN C
Cystatin C: 2.28 mg/L — ABNORMAL HIGH (ref 0.61–0.95)
eGFRcr-cys: 35 mL/min/{1.73_m2} — ABNORMAL LOW (ref >=60)
eGFRcys: 26 mL/min/{1.73_m2} — ABNORMAL LOW (ref >=60)

## 2021-09-02 LAB — ALBUMIN/CREATININE URINE RATIO
Albumin, urine (INT/EXT): 34.6 mg/dL
Albumin/Creatinine ratio: 2291 mg/g{creat} — ABNORMAL HIGH (ref <=30)
Creatinine, urine, random (INT/EXT): 15.1 mg/dL — ABNORMAL LOW (ref 24.00–392.00)

## 2021-09-02 LAB — MAGNESIUM: Magnesium: 1.8 mg/dL (ref 1.6–2.6)

## 2021-09-02 LAB — PTH, INTACT: PTH: 179 pg/mL — ABNORMAL HIGH (ref 15.0–65.0)

## 2021-09-02 MED ORDER — lisinopril 10 mg tablet
10 | ORAL_TABLET | Freq: Every day | ORAL | 1 refills | Status: AC
Start: 2021-09-02 — End: 2022-03-01

## 2021-09-02 NOTE — Progress Notes (Signed)
 Primary Care Physician: Berenice Primas     PATIENT IDENTIFIER: Arthur Young is a 56 y.o. male with CKD stage g3a/a3 and a past medical history of HFrEF (BiV dysfunction with ejection fraction of 20-25% and severely reduced right ventricular function in 05/2019), COPD (not on home O2), pulmonary nodules, hypertension, pulmonary hypertension, benign prostatic hypertrophy with lower urinary tract symptoms, type 2 diabetes mellitus, and schizoaffective disorder.  He presented as a new patient in the renal clinic on 08/20/20 for discharge follow-up of ATN. He was admitted from 07/04/20-07/16/20 for acute respiratory failure secondary to heart failure and pneumonia. During that admission, he also developed AKI on CKD stage g3a with a peak creatinine of 4.14 mg/dL from his baseline of 1.4 mg/dL. Urinalysis at the time showed 3+ protein (191 spot protein, 123 spot albumin, Protein/Cr 3571 mg/g, Albumin/Cr 2307 mg/g) and thought his CKD is probably related to type 2 diabetes mellitus and hypertension. Nephrology was consulted during his admission for oliguria in the setting of his AKI, and his acute kidney dysfunction was thought to be pre-renal in the setting of cardiorenal syndrome and he developed post-ATN diuresis. He was given Diamox x1 for alkalosis and bicarbonate improved. His kidney function improved and his creatinine on discharge was 2.02 mg/dL (eGFR 38); labs were stable at this level on 07/28/21.    Subjective    INTERVAL HISTORY:   He was last [facility] as a new patient on 08/20/2020.      Since his last visit, Sarah Zerby reports:   - He feels much better than the last visit, and denies chest pain or dyspnea.  - No headaches or dizziness.  - He can sleep at night, but states that he has to take a nap each day for a few hours. He reports nocturia x1, no other urinary issues.   - Appetite is good, is a vegetarian for the last 6-7 years. Denies nausea, vomiting, diarrhea; he uses  several stool softeners for chronic constipation and denies hematochezia.   - He has itching on lower legs and wrists; has topical lotion that he uses with good effect.   - He continues to follow up with Cardiology.       Patient Active Problem List   Diagnosis   . AKI (acute kidney injury) (CMS/HCC)   . Benign prostatic hyperplasia with urinary obstruction   . Bronchiectasis without complication (CMS/HCC)   . Chronic bronchitis (CMS/HCC)   . Chronic constipation   . Chronic HFrEF (heart failure with reduced ejection fraction) (CMS/HCC)   . Chronic venous stasis dermatitis of both lower extremities   . Cigarette nicotine dependence without complication   . CKD stage G3b/A3, GFR 30-44 and albumin creatinine ratio >300 mg/g (CMS/HCC)   . Dysarthria   . Enlarged prostate   . Gammopathy, monoclonal   . Hyperkalemia   . Hypermetropia of both eyes   . Hypertriglyceridemia   . Macrocytosis without anemia   . Male erectile disorder   . Multiple lung nodules on CT   . Paranoid schizophrenia (CMS/HCC)   . Polycythemia, secondary   . Essential (primary) hypertension   . Primary open-angle glaucoma   . Pulmonary hypertension (CMS/HCC)   . Rectal adenocarcinoma (CMS/HCC)   . Schizoaffective disorder (CMS/HCC)   . Severe chronic obstructive pulmonary disease (CMS/HCC)   . Type 2 diabetes mellitus with diabetic nephropathy, without long-term current use of insulin (CMS/HCC)   . Urinary retention   . Vitamin D deficiency  Current Outpatient Medications   Medication Instructions   . albuterol 90 mcg/actuation inhaler 2 puffs, inhalation   . aspirin 81 mg, oral, Every morning   . atorvastatin (LIPITOR) 80 mg, oral, Nightly   . benzonatate (TESSALON) 200 mg, oral, Every 8 hours PRN   . bisacodyl (DULCOLAX) 10 mg, oral, Daily PRN   . brimonidine (AlphaGAN P) 0.15 % ophthalmic solution 1 drop, ophthalmic (eye), 3 times daily   . carBAMazepine (TEGRETOL) 200 mg, oral, 2 times daily   . diclofenac (Voltaren) 1 % topical gel topical  (top)   . Enulose 10 gram/15 mL solution SMARTSIG:15 Milliliter(s) By Mouth Every Morning   . fluticasone-umeclidin-vilanter (Trelegy Ellipta) 100-62.5-25 mcg blister with device 1 puff, inhalation, Daily RT   . furosemide (LASIX) 40 mg, oral, Every morning   . hydrALAZINE (APRESOLINE) 50 mg, oral, 3 times daily   . hydrocortisone 2.5 % cream SMARTSIG:Topical   . metFORMIN XR (GLUCOPHAGE-XR) 500 mg, oral, 2 times daily   . metoprolol succinate XL (Toprol-XL) 50 mg 24 hr tablet TAKE ONE TABLET BY MOUTH ONCE DAILY   . mirtazapine (REMERON) 15 mg, oral, Nightly   . OLANZapine (ZYPREXA) 10 mg, oral, Nightly   . QUEtiapine (SEROQUEL) 400 mg, oral   . senna 8.6 mg tablet 2 tablets, oral, 2 times daily   . Stool Softener 100 mg, oral, 2 times daily   . tamsulosin (FLOMAX) 0.4 mg, oral, Nightly     No Known Allergies  History reviewed. No pertinent past medical history.  History reviewed. No pertinent surgical history.  No family history on file.  Social History     Tobacco Use   . Smoking status: Every Day     Packs/day: 1.00     Years: 30.00     Pack years: 30.00     Types: Cigarettes   . Smokeless tobacco: Never   Vaping Use   . Vaping status: None   Substance Use Topics   . Alcohol use: Never   . Drug use: Never       Objective    PHYSICAL EXAM:   Vitals:    09/02/21 1234   BP: 119/65   Pulse: 95   Weight: 68.7 kg        Physical Exam  Vitals reviewed.   Constitutional:       General: He is not in acute distress.     Appearance: He is not ill-appearing.   Cardiovascular:      Rate and Rhythm: Normal rate and regular rhythm.      Pulses: Normal pulses.      Heart sounds: Normal heart sounds.   Pulmonary:      Effort: Pulmonary effort is normal. No respiratory distress.      Breath sounds: Normal breath sounds.   Musculoskeletal:         General: No swelling.   Skin:     General: Skin is warm and dry.   Neurological:      Mental Status: He is alert and oriented to person, place, and time.         LABORATORY  DATA:      Labs Ordered Recently:   Office Visit on 09/02/2021   Component Date Value   . Albumin 09/02/2021 4.1    . Calcium 09/02/2021 9.0    . Phosphorus 09/02/2021 3.0    . Glucose 09/02/2021 98    . Fasting? 09/02/2021 Unknown    . BUN 09/02/2021 13    .  Creatinine 09/02/2021 1.63 (H)    . eGFRcr 09/02/2021 49 (L)    . Sodium 09/02/2021 138    . Potassium 09/02/2021 4.2    . Chloride 09/02/2021 102    . CO2 (Bicarbonate) 09/02/2021 25    . Anion Gap 09/02/2021 11    . Magnesium 09/02/2021 1.8    . Cystatin C 09/02/2021 2.28 (H)    . eGFRcys 09/02/2021 26 (L)    . eGFRcr-cys 09/02/2021 35 (L)    . Albumin, urine 09/02/2021 34.6    . Creatinine, urine, random 09/02/2021 15.10 (L)    . Albumin/Creatinine ratio 09/02/2021 2,291 (H)    . Protein, urine 09/02/2021 52.3 (H)    . Creatinine, urine, random 09/02/2021 15.10 (L)    . Protein/Creatinine ratio 09/02/2021 3,464 (H)    . Spec Gravity, UA 09/02/2021 <=1.005    . PH UA, POC 09/02/2021 6.0    . Leukocytes, UA 09/02/2021 Negative    . Nitrite, UA 09/02/2021 Negative    . Protein, UA 09/02/2021 1+    . Glucose, UA 09/02/2021 Normal    . Ketones, UA 09/02/2021 Negative    . Urobilinogen, UA POC 09/02/2021 Normal    . Bilirubin, UA 09/02/2021 Negative    . Blood, UA 09/02/2021 Negative    . PTH 09/02/2021 179.0 (H)    . Vitamin D 25-OH, D3 09/02/2021 24    . Vitamin D 25-OH, D2 09/02/2021 <2    . Vit D, 25-Hydroxy 09/02/2021 24    . Protein, urine 09/02/2021 52.1 (H)    . Creatinine, urine, random 09/02/2021 12.20 (L)    . Protein/Creatinine ratio 09/02/2021 4,270 (H)    . UPEP Interp 09/02/2021     . Kappa Free Light Chains 09/02/2021 92.5 (H)    . Lambda Free Light Chains 09/02/2021 199.0 (H)    . Free Kappa/Lambda ratio 09/02/2021 0.46    . WBC 09/02/2021 11.6 (H)    . RBC 09/02/2021 4.90    . Hemoglobin 09/02/2021 15.2    . Hematocrit 09/02/2021 47.2    . MCV 09/02/2021 96.3    . [facility] 09/02/2021 31.0    . MCHC 09/02/2021 32.2    . RDW-CV 09/02/2021 13.1    .  RDW-SD 09/02/2021 46.7    . Platelets 09/02/2021 288    . MPV 09/02/2021 9.3    . Neutrophil % 09/02/2021 70.3    . Lymphocyte % 09/02/2021 19.9    . Monocytes % 09/02/2021 6.3    . Eosinophils % 09/02/2021 1.9    . Basophils % 09/02/2021 0.9    . Immature Granulocytes % 09/02/2021 0.7    . NRBC % 09/02/2021 0.0    . Neutrophils Absolute 09/02/2021 8.13 (H)    . Lymphocytes Absolute 09/02/2021 2.31    . Monocytes Absolute 09/02/2021 0.73    . Eosinophils Absolute 09/02/2021 0.22    . Basophils Absolute 09/02/2021 0.11    . Immature Granulocytes Ab* 09/02/2021 0.08    . NRBC Absolute 09/02/2021 0.00    . Albumin (SPEP) 09/02/2021 4.35    . Alpha 1 (SPEP) 09/02/2021 0.21    . Alpha 2 (SPEP) 09/02/2021 1.10 (H)    . Beta (SPEP) 09/02/2021 0.97    . Gamma Globulin (SPEP) 09/02/2021 0.77    . Protein Electrophoresis * 09/02/2021     . Protein, total 09/02/2021 7.4    . UIFE Interpretation 09/02/2021     . Serum IFE Interpretation 09/02/2021  The ASCVD Risk score (Arnett DK, et al., 2019) failed to calculate for the following reasons:    The patient has a prior MI or stroke diagnosis    Assessment/Plan    CKD stage G3a/A3, GFR 45-59 and albumin creatinine ratio >300 mg/g (CMS/HCC)  CKD stage g3a/a3 and a past medical history of HFrEF (BiV dysfunction with ejection fraction of 20-25% and severely reduced right ventricular function in 05/2019), COPD (not on home O2), pulmonary nodules, hypertension, pulmonary hypertension, benign prostatic hypertrophy with lower urinary tract symptoms, type 2 diabetes mellitus, and schizoaffective disorder.  He presented as a new patient in the renal clinic on 08/20/20 for discharge follow-up of ATN. He was admitted from 07/04/20-07/16/20 for acute respiratory failure secondary to heart failure and pneumonia. During that admission, he also developed AKI on CKD stage g3a with a peak creatinine of 4.14 mg/dL from his baseline of 1.4 mg/dL. Urinalysis at the time showed 3+ protein (191 spot  protein, 123 spot albumin, Protein/Cr 3571 mg/g, Albumin/Cr 2307 mg/g) and thought his CKD is probably related to type 2 diabetes mellitus and hypertension. Nephrology was consulted during his admission for oliguria in the setting of his AKI, and his acute kidney dysfunction was thought to be pre-renal in the setting of cardiorenal syndrome and he developed post-ATN diuresis. He was given Diamox x1 for alkalosis and bicarbonate improved. His kidney function improved and his creatinine on discharge was 2.02 mg/dL (eGFR 38); labs were stable at this level on 07/28/21.    eGFR-creatinine is improved from recent hospital discharge at 49 ml/min, serum creatinine 1.63 mg/dl, and urine albumin/creatinine ratio of 2291 mg/g. eGFR-cystatin/creatinine is lower at 35 ml/min. There is no anemia, acidosis, or electrolyte abnormality. Calcium and phosphorus are normal.    Essential (primary) hypertension  Blood pressure is well-controlled without symptoms such as headache, dizziness, lightheadedness, or pre-syncope. Given his marked albuminuria, we will stop hydralazine 50mg  three times a day and start lisinopril 10mg  once a day. We plan to see him back in two weeks for a blood pressure check and repeat labs, and will adjust the dose of lisinopril accordingly.     Gammopathy, monoclonal  SPEP, UPEP, IFE, and free light chainswere checked today, and IFE report states: "Small restricted band in the IgA and corresponding lambda immunoglobulin lanes consistent with a monoclonal IgA - Lambda gammopathy. This band migrates electrophoretically to the beta-gamma interface. This band is within a polyclonal background."

## 2021-09-04 LAB — IMMUNOFIXATION ELECTROPHORESIS URINE

## 2021-09-04 LAB — IMMUNOFIXATION ELECTROPHORESIS, SERUM

## 2021-09-04 LAB — PROTEIN ELECTROPHORESIS, URINE
Creatinine, urine, random (INT/EXT): 12.2 mg/dL — ABNORMAL LOW (ref 24.00–392.00)
Protein, urine: 52.1 mg/dL — ABNORMAL HIGH (ref <15.0)
Protein/Creatinine ratio: 4270 mg/g{creat} — ABNORMAL HIGH (ref <=200)

## 2021-09-04 LAB — PROTEIN ELECTROPHORESIS, SERUM
Albumin (SPEP): 4.35 g/dL (ref 3.50–5.10)
Alpha 1 (SPEP): 0.21 g/dL (ref 0.10–0.30)
Alpha 2 (SPEP): 1.1 g/dL — ABNORMAL HIGH (ref 0.50–1.00)
Beta (SPEP): 0.97 g/dL (ref 0.50–1.10)
Gamma Globulin (SPEP): 0.77 g/dL (ref 0.50–1.30)

## 2021-09-05 LAB — VITAMIN D 25 HYDROXY
Vit D, 25-Hydroxy: 24 ng/mL (ref 20–100)
Vitamin D 25-OH, D2: <2 ng/mL
Vitamin D 25-OH, D3: 24 ng/mL

## 2021-09-05 NOTE — Assessment & Plan Note (Signed)
Blood pressure is well-controlled without symptoms such as headache, dizziness, lightheadedness, or pre-syncope. Given his marked albuminuria, we will stop hydralazine 50mg  three times a day and start lisinopril 10mg  once a day. We plan to see him back in two weeks for a blood pressure check and repeat labs, and will adjust the dose of lisinopril accordingly.

## 2021-09-05 NOTE — Assessment & Plan Note (Signed)
SPEP, UPEP, IFE, and free light chainswere checked today, and IFE report states: "Small restricted band in the IgA and corresponding lambda immunoglobulin lanes consistent with a monoclonal IgA - Lambda gammopathy. This band migrates electrophoretically to the beta-gamma interface. This band is within a polyclonal background."

## 2021-09-05 NOTE — Assessment & Plan Note (Signed)
CKD stage g3a/a3 and a past medical history of HFrEF (BiV dysfunction with ejection fraction of 20-25% and severely reduced right ventricular function in 05/2019), COPD (not on home O2), pulmonary nodules, hypertension, pulmonary hypertension, benign prostatic hypertrophy with lower urinary tract symptoms, type 2 diabetes mellitus, and schizoaffective disorder.  He presented as a new patient in the renal clinic on 08/20/20 for discharge follow-up of ATN. He was admitted from 07/04/20-07/16/20 for acute respiratory failure secondary to heart failure and pneumonia. During that admission, he also developed AKI on CKD stage g3a with a peak creatinine of 4.14 mg/dL from his baseline of 1.4 mg/dL. Urinalysis at the time showed 3+ protein (191 spot protein, 123 spot albumin, Protein/Cr 3571 mg/g, Albumin/Cr 2307 mg/g) and thought his CKD is probably related to type 2 diabetes mellitus and hypertension. Nephrology was consulted during his admission for oliguria in the setting of his AKI, and his acute kidney dysfunction was thought to be pre-renal in the setting of cardiorenal syndrome and he developed post-ATN diuresis. He was given Diamox x1 for alkalosis and bicarbonate improved. His kidney function improved and his creatinine on discharge was 2.02 mg/dL (eGFR 38); labs were stable at this level on 07/28/21.    eGFR-creatinine is improved from recent hospital discharge at 49 ml/min, serum creatinine 1.63 mg/dl, and urine albumin/creatinine ratio of 2291 mg/g. eGFR-cystatin/creatinine is lower at 35 ml/min. There is no anemia, acidosis, or electrolyte abnormality. Calcium and phosphorus are normal.

## 2021-09-16 ENCOUNTER — Ambulatory Visit: Attending: Nephrology | Primary: Family

## 2021-09-25 MED ORDER — sodium zirconium cyclosilicate (Lokelma) 5 gram packet
5 | ORAL | 5 refills | 30.00000 days | Status: AC
Start: 2021-09-25 — End: 2022-03-24

## 2021-09-25 NOTE — Progress Notes (Signed)
RXSP MEDICATION ACCESS - PHARMACY BENEFITS CHECK        MEDICATION/DRUG:  Lokelma 5g    Rx COVERAGE (Type):  [employer] (Medicare)    OUTCOME:  Is a prior authorization required?: Yes  Co-pay Card Eligibility: No - Patient has Medicare/Medicaid coverage.    NOTES:  Prior authorization required - submitted via ePA       Signed,  Lorayne BenderJoanna Kacy Hegna, PharmD, RPh  [facility]

## 2021-09-25 NOTE — Progress Notes (Signed)
RXSP MEDICATION ACCESS - PHARMACY BENEFIT      Initial RX PA approved for Lokelma 5grams per [employer] (ph: [phone]).    Plan type: Medicare    PA effective from 09/25/2021 to 09/26/2022   PA #: [id number]    RX can be filled at [facility], though patient prefers to fill with local pharmacy  Copay information: $0/month   Copay card can be applied: No.        Signed,  Lorayne Bender, PharmD, RPh  [facility]

## 2021-10-01 NOTE — Telephone Encounter (Signed)
Patient care giver called regarding medication lokelma 5 mg she says that the pharmacy call them to let them know that the insurance doesn't cover that medication and to please override     Thank you !  Her # [phone]  [facility] - [place], [place] - [address] AT [facility]   [address] # [id number], [place] [address]   Phone:  [phone] Fax:  [phone]

## 2021-10-02 NOTE — Telephone Encounter (Signed)
Hi,  Patient's given care called and requested a letter from our provider to give her a permission to give the medication of Lokelma to patient. The fax # is [phone]. For any question, please contact them at [phone]. Thanks

## 2021-10-02 NOTE — Telephone Encounter (Signed)
I have already faxed them a letter and told them if it did not suffice to send [place] a blank order form to fill out that will  I called number below last night and this afternoon and no one picks up the phone  -aly

## 2021-12-02 ENCOUNTER — Ambulatory Visit: Attending: Physician Assistant | Primary: Family

## 2021-12-02 DIAGNOSIS — N1831 Chronic kidney disease, stage 3a: Secondary | ICD-10-CM

## 2021-12-02 NOTE — Progress Notes (Deleted)
Primary Care Physician: Berenice Primas     PATIENT IDENTIFIER: Arthur Young is a 56 y.o. male with CKD stage g3a/a3 and a past medical history of HFrEF (BiV dysfunction with ejection fraction of 20-25% and severely reduced right ventricular function in 05/2019), COPD (not on home O2), pulmonary nodules, hypertension, pulmonary hypertension, benign prostatic hypertrophy with lower urinary tract symptoms, type 2 diabetes mellitus, and schizoaffective disorder.  He presented as a new patient in the renal clinic on 08/20/20 for discharge follow-up of ATN. He was admitted from 07/04/20-07/16/20 for acute respiratory failure secondary to heart failure and pneumonia. During that admission, he also developed AKI on CKD stage g3a with a peak creatinine of 4.14 mg/dL from his baseline of 1.4 mg/dL. Urinalysis at the time showed 3+ protein (191 spot protein, 123 spot albumin, Protein/Cr 3571 mg/g, Albumin/Cr 2307 mg/g) and thought his CKD is probably related to type 2 diabetes mellitus and hypertension. Nephrology was consulted during his admission for oliguria in the setting of his AKI, and his acute kidney dysfunction was thought to be pre-renal in the setting of cardiorenal syndrome and he developed post-ATN diuresis. He was given Diamox x1 for alkalosis and bicarbonate improved. His kidney function improved and his creatinine on discharge was 2.02 mg/dL (eGFR 38); labs were stable at this level on 07/28/21.    Subjective    INTERVAL HISTORY:   [facility] on [facility]2021.      Since his last visit, Judd Mccubbin reports:   - He feels much better than the last visit, and denies chest pain or dyspnea.  - No headaches or dizziness.  - He can sleep at night, but states that he has to take a nap each day for a few hours. He reports nocturia x1, no other urinary issues.   - Appetite is good, is a vegetarian for the last 6-7 years. Denies nausea, vomiting, diarrhea; he uses several stool softeners  for chronic constipation and denies hematochezia.   - He has itching on lower legs and wrists; has topical lotion that he uses with good effect.   - He continues to follow up with Cardiology.     12/02/21:  -       Patient Active Problem List   Diagnosis   . AKI (acute kidney injury) (CMS/HCC)   . Benign prostatic hyperplasia with urinary obstruction   . Bronchiectasis without complication (CMS/HCC)   . Chronic bronchitis (CMS/HCC)   . Chronic constipation   . Chronic HFrEF (heart failure with reduced ejection fraction) (CMS/HCC)   . Chronic venous stasis dermatitis of both lower extremities   . Cigarette nicotine dependence without complication   . CKD stage G3a/A3, GFR 45-59 and albumin creatinine ratio >300 mg/g (CMS/HCC)   . Dysarthria   . Enlarged prostate   . Gammopathy, monoclonal   . Hyperkalemia   . Hypermetropia of both eyes   . Hypertriglyceridemia   . Macrocytosis without anemia   . Male erectile disorder   . Multiple lung nodules on CT   . Paranoid schizophrenia (CMS/HCC)   . Polycythemia, secondary   . Essential (primary) hypertension   . Primary open-angle glaucoma   . Pulmonary hypertension (CMS/HCC)   . Rectal adenocarcinoma (CMS/HCC)   . Schizoaffective disorder (CMS/HCC)   . Severe chronic obstructive pulmonary disease (CMS/HCC)   . Type 2 diabetes mellitus with diabetic nephropathy, without long-term current use of insulin (CMS/HCC)   . Urinary retention   .  Vitamin D deficiency     Current Outpatient Medications   Medication Instructions   . albuterol 90 mcg/actuation inhaler 2 puffs, inhalation   . ammonium lactate (Lac-Hydrin) 12 % lotion SMARTSIG:Application Topical   . aspirin 81 mg, oral, Every morning   . atorvastatin (LIPITOR) 80 mg, oral, Nightly   . benzonatate (TESSALON) 200 mg, oral, Every 8 hours PRN   . bisacodyl (DULCOLAX) 10 mg, oral, Daily PRN   . brimonidine (AlphaGAN P) 0.15 % ophthalmic solution 1 drop, ophthalmic (eye), 3 times daily   . carBAMazepine (TEGRETOL) 200 mg, oral,  2 times daily   . diclofenac (Voltaren) 1 % topical gel topical (top)   . dorzolamide (Trusopt) 2 % ophthalmic solution 1 drop, ophthalmic (eye), 2 times daily   . Enulose 10 gram/15 mL solution SMARTSIG:15 Milliliter(s) By Mouth Every Morning   . fluticasone-umeclidin-vilanter (Trelegy Ellipta) 100-62.5-25 mcg blister with device 1 puff, inhalation, Daily RT   . furosemide (LASIX) 40 mg, oral, Every morning   . hydrocortisone 2.5 % cream SMARTSIG:Topical   . lisinopril 10 mg, oral, Daily   . metFORMIN XR (GLUCOPHAGE-XR) 500 mg, oral, 2 times daily   . metoprolol succinate XL (Toprol-XL) 50 mg 24 hr tablet TAKE ONE TABLET BY MOUTH ONCE DAILY   . mirtazapine (REMERON) 15 mg, oral, Nightly   . OLANZapine (ZYPREXA) 10 mg, oral, Nightly   . QUEtiapine (SEROQUEL) 400 mg, oral   . senna 8.6 mg tablet 2 tablets, oral, 2 times daily   . sodium zirconium cyclosilicate (LOKELMA) 5 g, oral, Every other day   . Stool Softener 100 mg, oral, 2 times daily   . tamsulosin (FLOMAX) 0.4 mg, oral, Nightly     Allergies   Allergen Reactions   . Divalproex      Other reaction(s): 40 lb wt gain  DEPAKOTE 40 lb wt gain--   . Lithium      No past medical history on file.  No past surgical history on file.  No family history on file.  Social History     Tobacco Use   . Smoking status: Every Day     Packs/day: 1.00     Years: 30.00     Pack years: 30.00     Types: Cigarettes   . Smokeless tobacco: Never   Substance Use Topics   . Alcohol use: Never   . Drug use: Never       Objective    PHYSICAL EXAM:   There were no vitals filed for this visit.     Physical Exam  Vitals reviewed.   Constitutional:       General: He is not in acute distress.     Appearance: He is not ill-appearing.   Cardiovascular:      Rate and Rhythm: Normal rate and regular rhythm.      Pulses: Normal pulses.      Heart sounds: Normal heart sounds.   Pulmonary:      Effort: Pulmonary effort is normal. No respiratory distress.      Breath sounds: Normal breath sounds.    Musculoskeletal:         General: No swelling.   Skin:     General: Skin is warm and dry.   Neurological:      Mental Status: He is alert and oriented to person, place, and time.         LABORATORY DATA:      Labs Ordered Recently:   No visits with results  within 1 Week(s) from this visit.   Latest known visit with results is:   Office Visit on 09/02/2021   Component Date Value   . Albumin 09/02/2021 4.1    . Calcium 09/02/2021 9.0    . Phosphorus 09/02/2021 3.0    . Glucose 09/02/2021 98    . Fasting? 09/02/2021 Unknown    . BUN 09/02/2021 13    . Creatinine 09/02/2021 1.63 (H)    . eGFRcr 09/02/2021 49 (L)    . Sodium 09/02/2021 138    . Potassium 09/02/2021 4.2    . Chloride 09/02/2021 102    . CO2 (Bicarbonate) 09/02/2021 25    . Anion Gap 09/02/2021 11    . Magnesium 09/02/2021 1.8    . Cystatin C 09/02/2021 2.28 (H)    . eGFRcys 09/02/2021 26 (L)    . eGFRcr-cys 09/02/2021 35 (L)    . Albumin, urine 09/02/2021 34.6    . Creatinine, urine, random 09/02/2021 15.10 (L)    . Albumin/Creatinine ratio 09/02/2021 2,291 (H)    . Protein, urine 09/02/2021 52.3 (H)    . Creatinine, urine, random 09/02/2021 15.10 (L)    . Protein/Creatinine ratio 09/02/2021 3,464 (H)    . Spec Gravity, UA 09/02/2021 <=1.005    . PH UA, POC 09/02/2021 6.0    . Leukocytes, UA 09/02/2021 Negative    . Nitrite, UA 09/02/2021 Negative    . Protein, UA 09/02/2021 1+    . Glucose, UA 09/02/2021 Normal    . Ketones, UA 09/02/2021 Negative    . Urobilinogen, UA POC 09/02/2021 Normal    . Bilirubin, UA 09/02/2021 Negative    . Blood, UA 09/02/2021 Negative    . PTH 09/02/2021 179.0 (H)    . Vitamin D 25-OH, D3 09/02/2021 24    . Vitamin D 25-OH, D2 09/02/2021 <2    . Vit D, 25-Hydroxy 09/02/2021 24    . Protein, urine 09/02/2021 52.1 (H)    . Creatinine, urine, random 09/02/2021 12.20 (L)    . Protein/Creatinine ratio 09/02/2021 4,270 (H)    . UPEP Interp 09/02/2021     . Kappa Free Light Chains 09/02/2021 92.5 (H)    . Lambda Free Light Chains  09/02/2021 199.0 (H)    . Free Kappa/Lambda ratio 09/02/2021 0.46    . WBC 09/02/2021 11.6 (H)    . RBC 09/02/2021 4.90    . Hemoglobin 09/02/2021 15.2    . Hematocrit 09/02/2021 47.2    . MCV 09/02/2021 96.3    . [facility] 09/02/2021 31.0    . MCHC 09/02/2021 32.2    . RDW-CV 09/02/2021 13.1    . RDW-SD 09/02/2021 46.7    . Platelets 09/02/2021 288    . MPV 09/02/2021 9.3    . Neutrophil % 09/02/2021 70.3    . Lymphocyte % 09/02/2021 19.9    . Monocytes % 09/02/2021 6.3    . Eosinophils % 09/02/2021 1.9    . Basophils % 09/02/2021 0.9    . Immature Granulocytes % 09/02/2021 0.7    . NRBC % 09/02/2021 0.0    . Neutrophils Absolute 09/02/2021 8.13 (H)    . Lymphocytes Absolute 09/02/2021 2.31    . Monocytes Absolute 09/02/2021 0.73    . Eosinophils Absolute 09/02/2021 0.22    . Basophils Absolute 09/02/2021 0.11    . Immature Granulocytes Ab* 09/02/2021 0.08    . NRBC Absolute 09/02/2021 0.00    . Albumin (SPEP) 09/02/2021 4.35    . Alpha  1 (SPEP) 09/02/2021 0.21    . Alpha 2 (SPEP) 09/02/2021 1.10 (H)    . Beta (SPEP) 09/02/2021 0.97    . Gamma Globulin (SPEP) 09/02/2021 0.77    . Protein Electrophoresis * 09/02/2021     . Protein, total 09/02/2021 7.4    . UIFE Interpretation 09/02/2021     . Serum IFE Interpretation 09/02/2021          The ASCVD Risk score (Arnett DK, et al., 2019) failed to calculate for the following reasons:    The patient has a prior MI or stroke diagnosis    Assessment/Plan    No problem-specific Assessment & Plan notes found for this encounter.

## 2022-03-18 IMAGING — US US ABDOMEN COMPLETE W/ ELASTOGRAPHY
2 series · 12 of 25 positions shown · non-contrast
Comparison: None

CLINICAL DATA: Chronic hepatitis-B

EXAM:
ULTRASOUND ABDOMEN
ULTRASOUND HEPATIC ELASTOGRAPHY
TECHNIQUE: Sonography of the upper abdomen was performed. In addition,
ultrasound elastography evaluation of the liver was performed. A
region of interest was placed within the right lobe of the liver.
Following application of a compressive sonographic pulse, tissue
compressibility was assessed. Multiple assessments were performed at
the selected site. Median tissue compressibility was determined.
Previously, hepatic stiffness was assessed by shear wave velocity.
Based on recently published Society of Radiologists in Ultrasound
consensus article, reporting is now recommended to be performed in
the SI units of pressure (kiloPascals) representing hepatic
stiffness/elasticity. The obtained result is compared to the
published reference standards. (cACLD = compensated Advanced Chronic
Liver Disease)

[Series 1: us abdomen complete w/elastography · 9 of 96 slices shown]
[im 6/96]
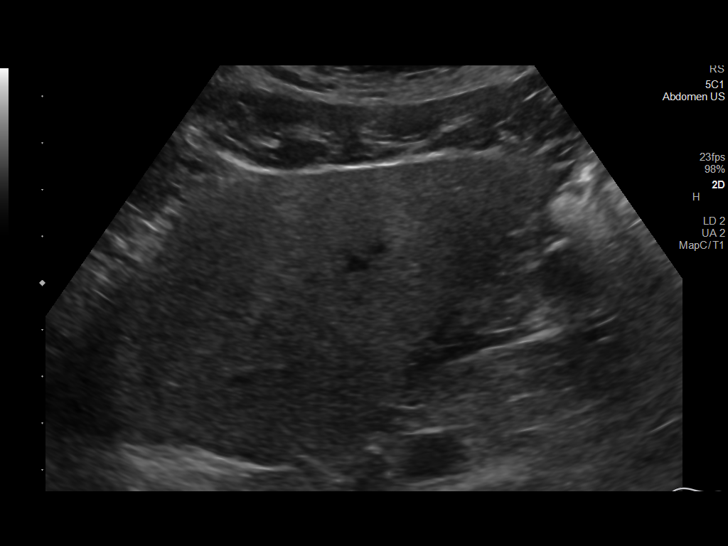
[im 17/96]
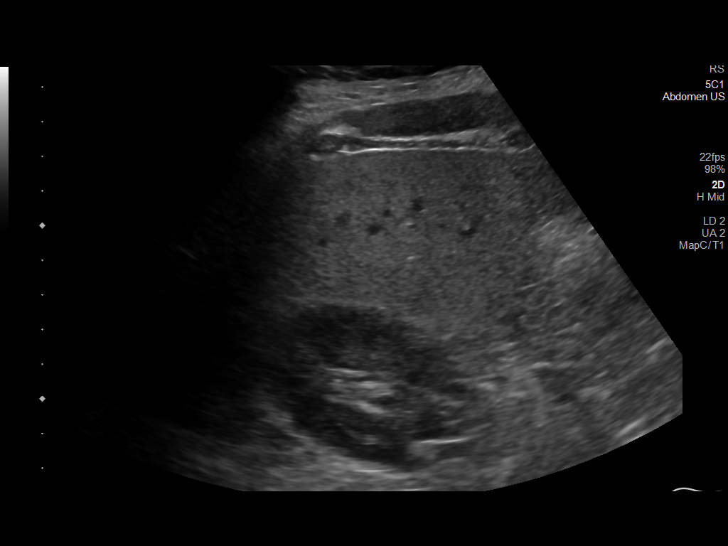
[im 28/96]
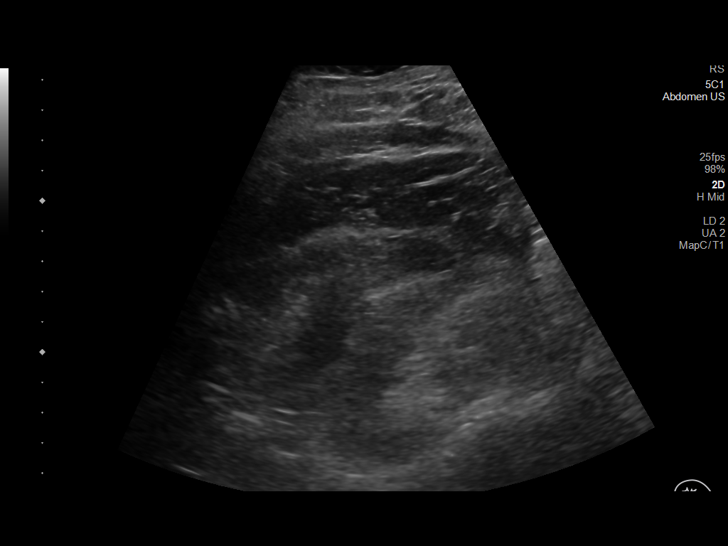
[im 40/96]
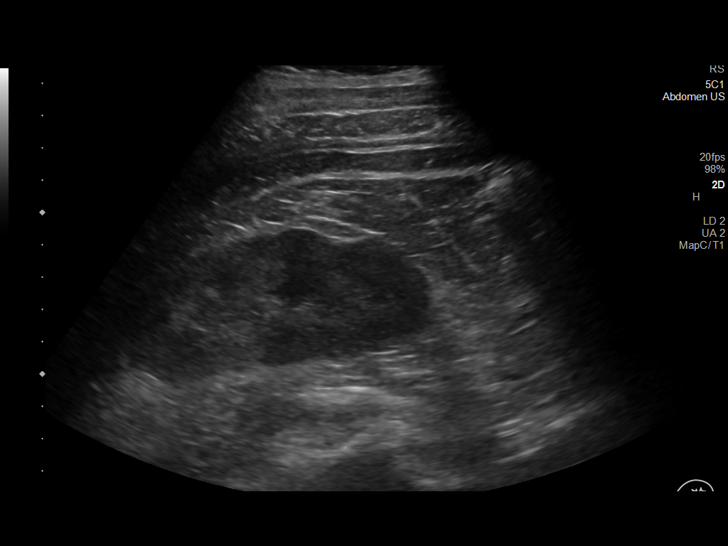
[im 51/96]
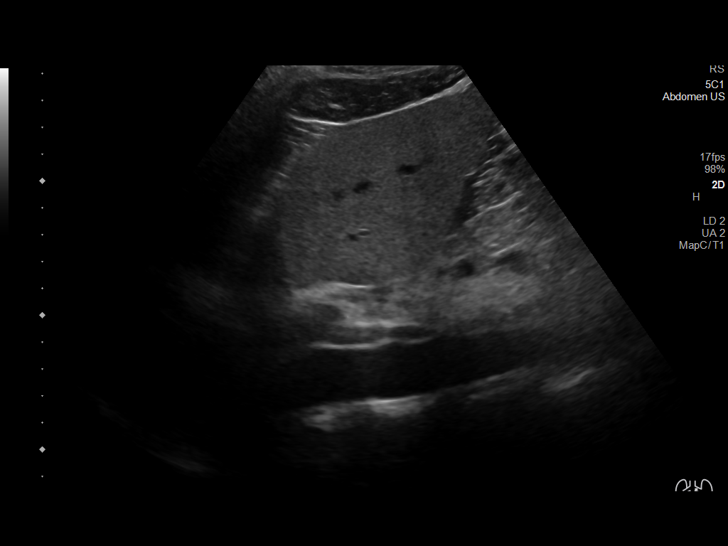
[im 62/96]
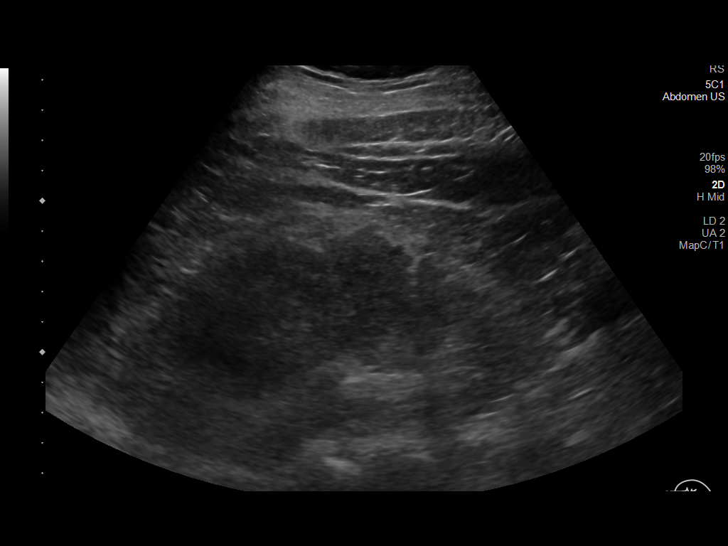
[im 73/96]
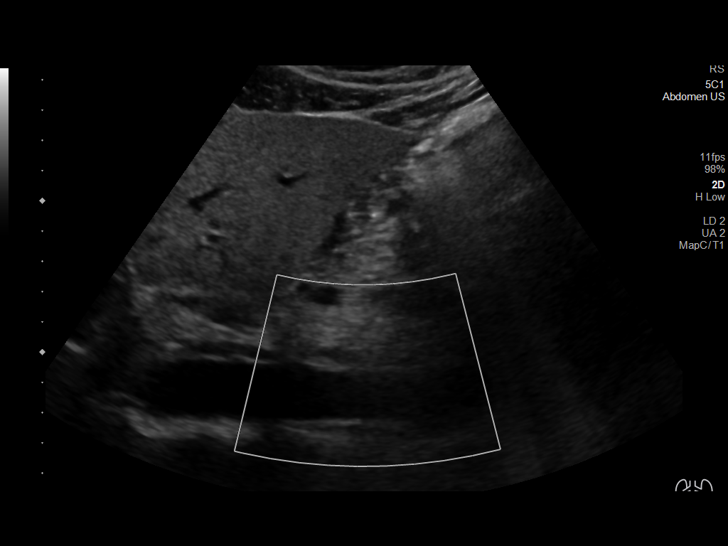
[im 84/96]
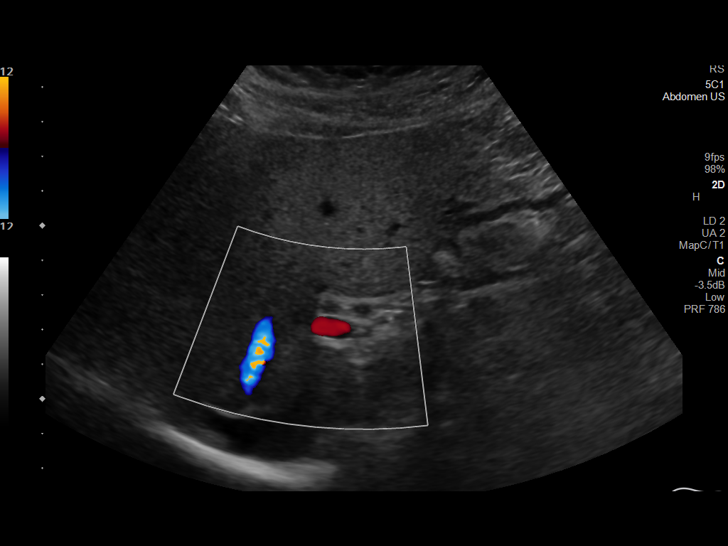
[im 96/96]
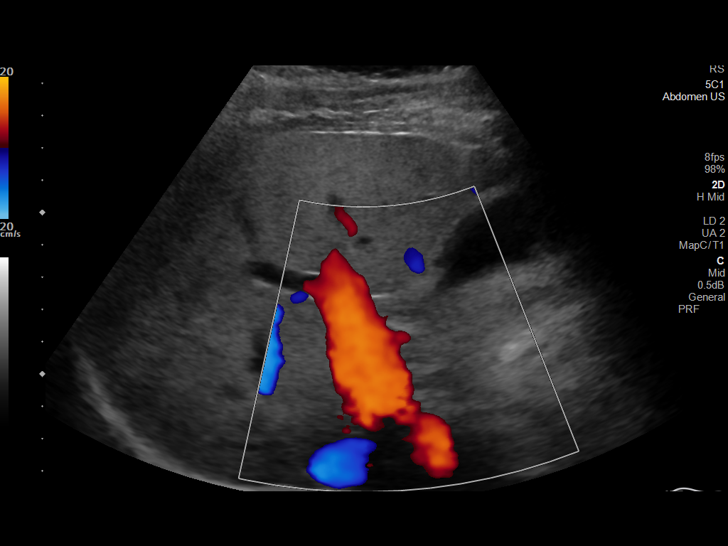

[Series 1001: abdomen us · 3 of 34 slices shown]
[im 6/34]
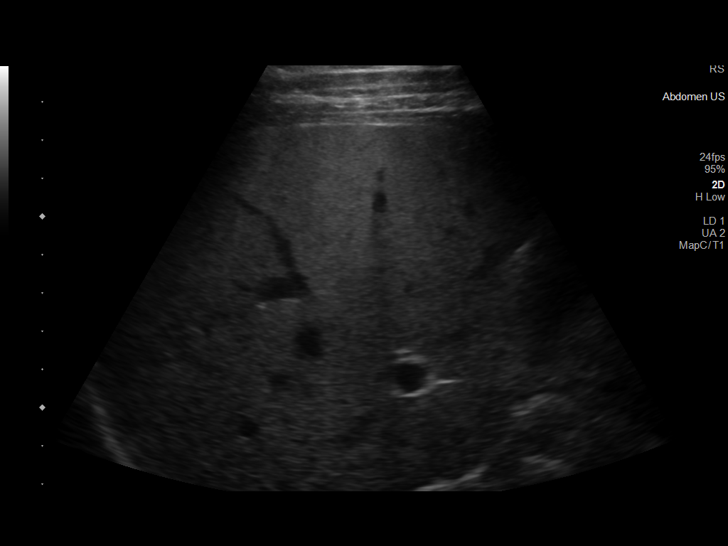
[im 17/34]
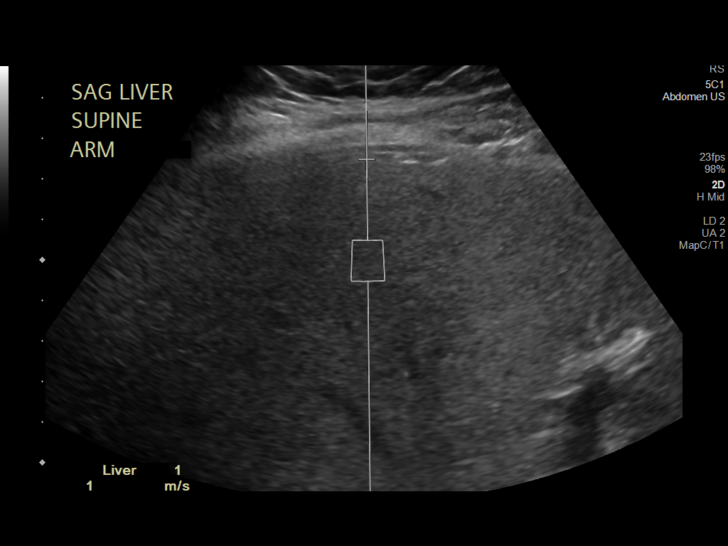
[im 28/34]
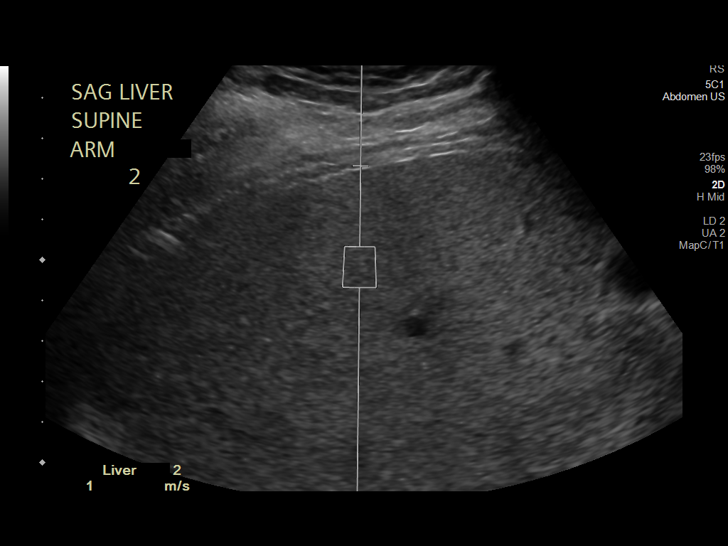

[12 of 25 positions shown; findings below may reference images not displayed]

FINDINGS: ULTRASOUND ABDOMEN

Gallbladder: Normally distended without stones or wall thickening.
No pericholecystic fluid or sonographic Murphy sign.

Common bile duct: Diameter: 4 mm, normal

Liver: Echogenic parenchyma, likely fatty infiltration though this
can be seen with cirrhosis and certain infiltrative disorders. No
focal hepatic mass or nodularity. Slightly greater heterogeneity is
seen within the LEFT lobe than RIGHT. Portal vein is patent on color
Doppler imaging with normal direction of blood flow towards the
liver.

IVC: Normal appearance

Pancreas: Normal appearance

Spleen: Normal appearance, 5.2 cm length

Right Kidney: Length: 11.1 cm. Normal morphology without mass or
hydronephrosis.

Left Kidney: Length: 11.5 cm. Normal morphology without mass or
hydronephrosis.

Abdominal aorta: Normal caliber

Other findings: No free fluid

ULTRASOUND HEPATIC ELASTOGRAPHY

Device: Siemens Helix VTQ

Patient position: Supine

Transducer 5C1

Number of measurements: 10

Hepatic segment:  8

Median kPa:

IQR:

IQR/Median kPa ratio:

Data quality: IQR/Median kPa ratio of 0.3 or greater indicates
reduced accuracy

Diagnostic category:  < or = 5 kPa: high probability of being normal

The use of hepatic elastography is applicable to patients with viral
hepatitis and non-alcoholic fatty liver disease. At this time, there
is insufficient data for the referenced cut-off values and use in
other causes of liver disease, including alcoholic liver disease.
Patients, however, may be assessed by elastography and serve as
their own reference standard/baseline.

In patients with non-alcoholic liver disease, the values suggesting
compensated advanced chronic liver disease (cACLD) may be lower, and
patients may need additional testing with elasticity results of [DATE]
kPa.

Please note that abnormal hepatic elasticity and shear wave
velocities may also be identified in clinical settings other than
with hepatic fibrosis, such as: acute hepatitis, elevated right
heart and central venous pressures including use of beta blockers,
Keyli disease (Ghazwani), infiltrative processes such as
mastocytosis/amyloidosis/infiltrative tumor/lymphoma, extrahepatic
cholestasis, with hyperemia in the post-prandial state, and with
liver transplantation. Correlation with patient history, laboratory
data, and clinical condition recommended.

Diagnostic Categories:

< or =5 kPa: high probability of being normal

< or =9 kPa: in the absence of other known clinical signs, rules [DATE] kPa and ?13 kPa: suggestive of cACLD, but needs further testing

>13 kPa: highly suggestive of cACLD

> or =17 kPa: highly suggestive of cACLD with an increased
probability of clinically significant portal hypertension
IMPRESSION: ULTRASOUND ABDOMEN:

Probable fatty infiltration of liver as above.

Remainder of exam unremarkable.

ULTRASOUND HEPATIC ELASTOGRAPHY:

Median kPa:

Diagnostic category:  < or = 5 kPa: high probability of being normal

## 2022-04-09 ENCOUNTER — Ambulatory Visit: Admit: 2022-04-09 | Discharge: 2022-04-10 | Attending: Internal Medicine | Primary: Family Medicine

## 2022-04-09 DIAGNOSIS — J432 Centrilobular emphysema: Secondary | ICD-10-CM

## 2022-04-09 DIAGNOSIS — R911 Solitary pulmonary nodule: Secondary | ICD-10-CM

## 2022-04-09 MED ORDER — fluticasone-umeclidin-vilanter (Trelegy Ellipta) 100-62.5-25 mcg blister with device
100-62.5-25 | Freq: Every day | RESPIRATORY_TRACT | 3 refills | Status: AC
Start: 2022-04-09 — End: 2022-05-09
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
## 2022-09-23 NOTE — Progress Notes (Signed)
RXSP MEDICATION ACCESS - PHARMACY BENEFIT      Reauthorization RX PA approved for Lokelma per [employer] (ph: [phone]).    Plan type: Medicare    Current PA valid until 09/26/2022. Reauthorization PA submitted proactively to ensure continuation of therapy.    PA effective from 09/21/2022 to 09/21/2023     RX can be filled at [facility], though patient prefers to fill with local pharmacy   Copay information: $unknown, local pharmacy has recently filled, cannot run test claim to determine copay cost   Copay card can be applied: No      Letter scanned into patient's chart as well as attached in this note.    Signed,  Lorayne Bender, PharmD  Specialty Pharmacy

## 2022-10-01 NOTE — Progress Notes (Signed)
Patient Follow Up    Mr. Lonzo was recently due to renewal of Sour Erling PA. Nephrology Pharmacy team notified me that patient has not been seen in clinic since March 2023. Coordinated with clinic staff to get pt rescheduled for 11/03/22.    Earnest Bailey, PharmD, Patsy Baltimore, RPh  Clinical Pharmacy Specialist- Nephrology  [phone]

## 2022-11-03 ENCOUNTER — Ambulatory Visit: Attending: Physician Assistant | Primary: Family

## 2022-11-03 DIAGNOSIS — N1831 Chronic kidney disease, stage 3a (CMS-HCC) (HHS-HCC): Secondary | ICD-10-CM

## 2022-11-03 NOTE — Progress Notes (Deleted)
Primary Care Physician: Berenice Primas     PATIENT IDENTIFIER: Arthur Young is a 57 y.o. male with CKD stage g3a/a3 and a past medical history of HFrEF (BiV dysfunction with ejection fraction of 20-25% and severely reduced right ventricular function in 05/2019), COPD (not on home O2), pulmonary nodules, hypertension, pulmonary hypertension, benign prostatic hypertrophy with lower urinary tract symptoms, type 2 diabetes mellitus, and schizoaffective disorder.  He presented as a new patient in the renal clinic on 08/20/20 for discharge follow-up of ATN. He was admitted from 07/04/20-07/16/20 for acute respiratory failure secondary to heart failure and pneumonia. During that admission, he also developed AKI on CKD stage g3a with a peak creatinine of 4.14 mg/dL from his baseline of 1.4 mg/dL. Urinalysis at the time showed 3+ protein (191 spot protein, 123 spot albumin, Protein/Cr 3571 mg/g, Albumin/Cr 2307 mg/g) and thought his CKD is probably related to type 2 diabetes mellitus and hypertension. Nephrology was consulted during his admission for oliguria in the setting of his AKI, and his acute kidney dysfunction was thought to be pre-renal in the setting of cardiorenal syndrome and he developed post-ATN diuresis. He was given Diamox x1 for alkalosis and bicarbonate improved. His kidney function improved and his creatinine on discharge was 2.02 mg/dL (eGFR 38); labs were stable at this level on 07/28/21.    Subjective    INTERVAL HISTORY:   [facility] as [facility] new patient on 09/02/2021.      Since his last visit, Cervando Durnin reports:   - He feels much better than the last visit, and denies chest pain or dyspnea.  - No headaches or dizziness.  - He can sleep at night, but states that he has to take a nap each day for a few hours. He reports nocturia x1, no other urinary issues.   - Appetite is good, is a vegetarian for the last 6-7 years. Denies nausea, vomiting, diarrhea; he uses  several stool softeners for chronic constipation and denies hematochezia.   - He has itching on lower legs and wrists; has topical lotion that he uses with good effect.   - He continues to follow up with Cardiology.     11/03/2022:  -       Patient Active Problem List   Diagnosis    AKI (acute kidney injury) (CMS-HCC)    Benign prostatic hyperplasia with urinary obstruction    Bronchiectasis without complication (CMS-HCC) (HHS-HCC)    Chronic bronchitis (CMS-HCC) (HHS-HCC)    Chronic constipation    Chronic HFrEF (heart failure with reduced ejection fraction) (CMS-HCC) (HHS-HCC)    Chronic venous stasis dermatitis of both lower extremities    Cigarette nicotine dependence without complication    CKD stage G3a/A3, GFR 45-59 and albumin creatinine ratio >300 mg/g (CMS-HCC) (HHS-HCC)    Dysarthria    Enlarged prostate    Gammopathy, monoclonal    Hyperkalemia    Hypermetropia of both eyes    Hypertriglyceridemia    Macrocytosis without anemia    Male erectile disorder    Multiple lung nodules on CT    Paranoid schizophrenia (CMS-HCC) (HHS-HCC)    Polycythemia, secondary    Essential (primary) hypertension    Primary open-angle glaucoma    Pulmonary hypertension (CMS-HCC) (HHS-HCC)    Rectal adenocarcinoma (CMS-HCC) (HHS-HCC)    Schizoaffective disorder (CMS-HCC) (HHS-HCC)    Severe chronic obstructive pulmonary disease (CMS-HCC) (HHS-HCC)    Type 2 diabetes mellitus with diabetic nephropathy, without long-term  current use of insulin (CMS-HCC) (HHS-HCC)    Urinary retention    Vitamin D deficiency     Current Outpatient Medications   Medication Instructions    albuterol 90 mcg/actuation inhaler 2 puffs, inhalation    ammonium lactate (Lac-Hydrin) 12 % lotion SMARTSIG:Application Topical    aspirin 81 mg, oral, Every morning    atorvastatin (LIPITOR) 80 mg, oral, Nightly    benzonatate (TESSALON) 200 mg, oral, Every 8 hours PRN    bisacodyl (DULCOLAX) 10 mg, oral, Daily PRN    brimonidine (AlphaGAN P) 0.15 % ophthalmic  solution 1 drop, ophthalmic (eye), 3 times daily    carBAMazepine (TEGRETOL) 200 mg, oral, 2 times daily    diclofenac (Voltaren) 1 % topical gel topical (top)    dorzolamide (Trusopt) 2 % ophthalmic solution 1 drop, ophthalmic (eye), 2 times daily    Enulose 10 gram/15 mL solution SMARTSIG:15 Milliliter(s) By Mouth Every Morning    fluticasone-umeclidin-vilanter (Trelegy Ellipta) 100-62.5-25 mcg blister with device 1 Inhalation, inhalation, Daily RT    furosemide (LASIX) 40 mg, oral, Every morning    hydrocortisone 2.5 % cream SMARTSIG:Topical    lisinopril 10 mg, oral, Every morning    Lokelma 5 gram packet TAKE 5 GRAMS BY MOUTH EVERY OTHER DAY    metFORMIN XR (GLUCOPHAGE-XR) 500 mg, oral, 2 times daily    metoprolol succinate XL (Toprol-XL) 50 mg 24 hr tablet TAKE ONE TABLET BY MOUTH ONCE DAILY    mirtazapine (REMERON) 15 mg, oral, Nightly    OLANZapine (ZYPREXA) 10 mg, oral, Nightly    QUEtiapine (SEROQUEL) 400 mg, oral    senna 8.6 mg tablet 2 tablets, oral, 2 times daily    Stool Softener 100 mg, oral, 2 times daily    tamsulosin (FLOMAX) 0.4 mg, oral, Nightly     Allergies   Allergen Reactions    Divalproex      Other reaction(s): 40 lb wt gain  DEPAKOTE 40 lb wt gain--    Lithium      No past medical history on file.  No past surgical history on file.  No family history on file.  Social History     Tobacco Use    Smoking status: Every Day     Packs/day: 1.00     Years: 30.00     Additional pack years: 0.00     Total pack years: 30.00     Types: Cigarettes    Smokeless tobacco: Never   Substance Use Topics    Alcohol use: Never    Drug use: Never       Objective    PHYSICAL EXAM:   There were no vitals filed for this visit.       Physical Exam  Vitals reviewed.   Constitutional:       General: He is not in acute distress.     Appearance: He is not ill-appearing.   Cardiovascular:      Rate and Rhythm: Normal rate and regular rhythm.      Pulses: Normal pulses.      Heart sounds: Normal heart sounds.    Pulmonary:      Effort: Pulmonary effort is normal. No respiratory distress.      Breath sounds: Normal breath sounds.   Musculoskeletal:         General: No swelling.   Skin:     General: Skin is warm and dry.   Neurological:      Mental Status: He is alert and oriented to  person, place, and time.         LABORATORY DATA:      Labs Ordered Recently:   No visits with results within 1 Week(s) from this visit.   Latest known visit with results is:   Office Visit on 09/02/2021   Component Date Value    Albumin 09/02/2021 4.1     Calcium 09/02/2021 9.0     Phosphorus 09/02/2021 3.0     Glucose 09/02/2021 98     Fasting? 09/02/2021 Unknown     BUN 09/02/2021 13     Creatinine 09/02/2021 1.63 (H)     eGFRcr 09/02/2021 49 (L)     Sodium 09/02/2021 138     Potassium 09/02/2021 4.2     Chloride 09/02/2021 102     CO2 (Bicarbonate) 09/02/2021 25     Anion Gap 09/02/2021 11     Magnesium 09/02/2021 1.8     Cystatin C 09/02/2021 2.28 (H)     eGFRcys 09/02/2021 26 (L)     eGFRcr-cys 09/02/2021 35 (L)     Albumin, urine 09/02/2021 34.6     Creatinine, urine, random 09/02/2021 15.10 (L)     Albumin/Creatinine ratio 09/02/2021 2,291 (H)     Protein, urine 09/02/2021 52.3 (H)     Creatinine, urine, random 09/02/2021 15.10 (L)     Protein/Creatinine ratio 09/02/2021 3,464 (H)     Spec Gravity, UA, POC 09/02/2021 <=1.005     pH, UA, POC 09/02/2021 6.0     Leukocytes, UA, POC 09/02/2021 Negative     Nitrite, UA, POC 09/02/2021 Negative     Protein, UA, POC 09/02/2021 1+     Glucose, UA, POC 09/02/2021 Normal     Ketones, UA, POC 09/02/2021 Negative     Urobilinogen, UA, POC 09/02/2021 Normal     Bilirubin, UA, POC 09/02/2021 Negative     Blood, UA, POC 09/02/2021 Negative     PTH 09/02/2021 179.0 (H)     Vitamin D 25-OH, D3 09/02/2021 24     Vitamin D 25-OH, D2 09/02/2021 <2     Vit D, 25-Hydroxy 09/02/2021 24     Protein, urine 09/02/2021 52.1 (H)     Creatinine, urine, random 09/02/2021 12.20 (L)     Protein/Creatinine ratio  09/02/2021 4,270 (H)     UPEP Interp 09/02/2021      Kappa Free Light Chains 09/02/2021 92.5 (H)     Lambda Free Light Chains 09/02/2021 199.0 (H)     Free Kappa/Lambda ratio 09/02/2021 0.46     WBC 09/02/2021 11.6 (H)     RBC 09/02/2021 4.90     Hemoglobin 09/02/2021 15.2     Hematocrit 09/02/2021 47.2     MCV 09/02/2021 96.3     MCH 09/02/2021 31.0     MCHC 09/02/2021 32.2     RDW-CV 09/02/2021 13.1     RDW-SD 09/02/2021 46.7     Platelets 09/02/2021 288     MPV 09/02/2021 9.3     Neutrophil % 09/02/2021 70.3     Lymphocyte % 09/02/2021 19.9     Monocytes % 09/02/2021 6.3     Eosinophils % 09/02/2021 1.9     Basophils % 09/02/2021 0.9     Immature Granulocytes % 09/02/2021 0.7     NRBC % 09/02/2021 0.0     Neutrophils Absolute 09/02/2021 8.13 (H)     Lymphocytes Absolute 09/02/2021 2.31     Monocytes Absolute 09/02/2021 0.73     Eosinophils Absolute 09/02/2021 0.22  Basophils Absolute 09/02/2021 0.11     Immature Granulocytes Ab* 09/02/2021 0.08     NRBC Absolute 09/02/2021 0.00     Albumin (SPEP) 09/02/2021 4.35     Alpha 1 (SPEP) 09/02/2021 0.21     Alpha 2 (SPEP) 09/02/2021 1.10 (H)     Beta (SPEP) 09/02/2021 0.97     Gamma Globulin (SPEP) 09/02/2021 0.77     Protein Electrophoresis * 09/02/2021      Protein, total 09/02/2021 7.4     UIFE Interpretation 09/02/2021      Serum IFE Interpretation 09/02/2021          The ASCVD Risk score (Arnett DK, et al., 2019) failed to calculate for the following reasons:    The patient has a prior MI or stroke diagnosis    Assessment/Plan    No problem-specific Assessment & Plan notes found for this encounter.

## 2022-11-23 MED ORDER — sodium zirconium cyclosilicate (Lokelma) 5 gram packet
5 | ORAL | 3 refills | 30.00000 days | Status: AC
Start: 2022-11-23 — End: ?

## 2022-11-23 MED ORDER — sodium zirconium cyclosilicate (Lokelma) 5 gram packet
5 | ORAL | 3 refills | 30.00000 days | Status: DC
Start: 2022-11-23 — End: 2022-11-23

## 2022-11-23 NOTE — Telephone Encounter (Signed)
Department Name:     Patient: Arthur Young  MRN: [id number]  Agent: Evorn Gong    Clinical Access Center Scheduling Message    Patient called to request a refill of:  Lokelma 5 gram     The patient is out of medication     Pharmacy:  [facility] [address]     Informed patient it can take 3 business days to send prescription refill to pharmacy.    Call back number: [phone]

## 2022-12-08 ENCOUNTER — Ambulatory Visit: Admit: 2022-12-08 | Discharge: 2022-12-08 | Attending: Physician Assistant | Primary: Family Medicine

## 2022-12-08 DIAGNOSIS — N1831 Chronic kidney disease, stage 3a (Multi-HCC): Secondary | ICD-10-CM

## 2022-12-08 LAB — POCT URINALYSIS DIPSTICK
Bilirubin, UA, POC: NEGATIVE
Blood, UA, POC: NEGATIVE
Glucose, UA, POC: NORMAL
Ketones, UA, POC: NEGATIVE
Leukocytes, UA, POC: NEGATIVE
Nitrite, UA, POC: NEGATIVE
Protein, UA, POC: NEGATIVE
Spec Gravity, UA, POC: <=1.005 (ref 1.001–1.035)
Urobilinogen, UA, POC: NORMAL
pH, UA, POC: 7 (ref 5.0–8.0)

## 2022-12-08 MED ORDER — metoprolol succinate XL (Toprol-XL) 50 mg 24 hr tablet
50 | ORAL_TABLET | Freq: Every day | ORAL | 11 refills | Status: AC
Start: 2022-12-08 — End: 2023-12-08

## 2022-12-08 NOTE — Progress Notes (Signed)
Primary Care Physician: Berenice Primas     PATIENT IDENTIFIER: Arthur Young is a 56 y.o. male with CKD stage g3a/a3 and a past medical history of HFrEF (BiV dysfunction with ejection fraction of 20-25% and severely reduced right ventricular function in 05/2019), COPD (not on home O2), pulmonary nodules, hypertension, pulmonary hypertension, benign prostatic hypertrophy with lower urinary tract symptoms, type 2 diabetes mellitus, and schizoaffective disorder.  He presented as a new patient in the renal clinic on 08/20/20 for discharge follow-up of ATN. He was admitted from 07/04/20-07/16/20 for acute respiratory failure secondary to heart failure and pneumonia. During that admission, he also developed AKI on CKD stage g3a with a peak creatinine of 4.14 mg/dL from his baseline of 1.4 mg/dL. Urinalysis at the time showed 3+ protein (191 spot protein, 123 spot albumin, Protein/Cr 3571 mg/g, Albumin/Cr 2307 mg/g) and thought his CKD is probably related to type 2 diabetes mellitus and hypertension. Nephrology was consulted during his admission for oliguria in the setting of his AKI, and his acute kidney dysfunction was thought to be pre-renal in the setting of cardiorenal syndrome and he developed post-ATN diuresis. He was given Diamox x1 for alkalosis and bicarbonate improved. His kidney function improved and his creatinine on discharge was 2.02 mg/dL (eGFR 38); labs were stable at this level on 07/28/21.    Subjective    INTERVAL HISTORY:   [facility] as [facility] new patient on 09/02/2021.      Since his last visit, Arthur Young reports:   - Denies chest pain or dyspnea.  - No headaches or dizziness.  - He can sleep at night, but states that he has to take a nap each day for a few hours. He reports nocturia x1, no other urinary issues.   - Appetite is good, is a vegetarian for the last 7-8 years. Denies nausea, vomiting, diarrhea; he uses several stool softeners for chronic constipation and  denies hematochezia.   - He has itching on lower legs and wrists; has topical lotion that he uses with good effect.   - He has not seen Cardiology in the last two years.  - He smokes a pack of cigarettes a day.    Patient Active Problem List   Diagnosis    AKI (acute kidney injury) (CMS-HCC)    Benign prostatic hyperplasia with urinary obstruction    Bronchiectasis without complication (Multi-HCC)    Chronic bronchitis (Multi-HCC)    Chronic constipation    Chronic HFrEF (heart failure with reduced ejection fraction) (Multi-HCC)    Chronic venous stasis dermatitis of both lower extremities    Cigarette nicotine dependence without complication    CKD stage G3a/A3, GFR 45-59 and albumin creatinine ratio >300 mg/g (Multi-HCC)    Dysarthria    Enlarged prostate    Gammopathy, monoclonal    Hyperkalemia    Hypermetropia of both eyes    Hypertriglyceridemia (CMS-HCC)    Macrocytosis without anemia    Male erectile disorder    Multiple lung nodules on CT    Paranoid schizophrenia (Multi-HCC)    Polycythemia, secondary    Essential (primary) hypertension (CMS-HCC)    Primary open-angle glaucoma (CMS-HCC)    Pulmonary hypertension (Multi-HCC)    Rectal adenocarcinoma (Multi-HCC)    Schizoaffective disorder (Multi-HCC)    Severe chronic obstructive pulmonary disease (Multi-HCC)    Type 2 diabetes mellitus with diabetic nephropathy, without long-term current use of insulin (Multi-HCC)    Urinary retention  Vitamin D deficiency    Seizure disorder (Multi-HCC)     Current Outpatient Medications   Medication Instructions    albuterol 90 mcg/actuation inhaler 2 puffs, inhalation    ammonium lactate (Lac-Hydrin) 12 % lotion SMARTSIG:Application Topical    aspirin 81 mg, oral, Every morning    atorvastatin (LIPITOR) 80 mg, oral, Nightly    benzonatate (TESSALON) 200 mg, oral, Every 8 hours PRN    bisacodyl (DULCOLAX) 10 mg, oral, Daily PRN    brimonidine (AlphaGAN P) 0.15 % ophthalmic solution 1 drop, ophthalmic (eye), 3 times  daily    carBAMazepine (TEGRETOL) 200 mg, oral, 2 times daily    diclofenac (Voltaren) 1 % topical gel topical (top)    dorzolamide (Trusopt) 2 % ophthalmic solution 1 drop, ophthalmic (eye), 2 times daily    Enulose 10 gram/15 mL solution SMARTSIG:15 Milliliter(s) By Mouth Every Morning    fluticasone-umeclidin-vilanter (Trelegy Ellipta) 100-62.5-25 mcg blister with device 1 Inhalation, inhalation, Daily RT    furosemide (LASIX) 40 mg, oral, Every morning    hydrocortisone 2.5 % cream SMARTSIG:Topical    lisinopril 10 mg, oral, Every morning    metFORMIN XR (GLUCOPHAGE-XR) 500 mg, oral, 2 times daily    metoprolol succinate XL (Toprol-XL) 50 mg 24 hr tablet TAKE ONE TABLET BY MOUTH ONCE DAILY    mirtazapine (REMERON) 15 mg, oral, Nightly    OLANZapine (ZYPREXA) 10 mg, oral, Nightly    QUEtiapine (SEROQUEL) 400 mg, oral    senna 8.6 mg tablet 2 tablets, oral, 2 times daily    sodium zirconium cyclosilicate (Lokelma) 5 gram packet TAKE 5 GRAMS BY MOUTH EVERY OTHER DAY    Stool Softener 100 mg, oral, 2 times daily    tamsulosin (FLOMAX) 0.4 mg, oral, Nightly     Allergies   Allergen Reactions    Divalproex      Other reaction(s): 40 lb wt gain  DEPAKOTE 40 lb wt gain--    Lithium      History reviewed. No pertinent past medical history.  History reviewed. No pertinent surgical history.  No family history on file.  Social History     Tobacco Use    Smoking status: Every Day     Packs/day: 1.00     Years: 35.00     Additional pack years: 0.00     Total pack years: 35.00     Types: Cigarettes    Smokeless tobacco: Never   Substance Use Topics    Alcohol use: Never    Drug use: Never       Objective    PHYSICAL EXAM:   Vitals:    12/08/22 1330   BP: 131/76   Pulse: 69   Weight: 64 kg   Height: 1.6 m          Physical Exam  Vitals reviewed.   Constitutional:       General: He is not in acute distress.     Appearance: He is not ill-appearing.   Cardiovascular:      Rate and Rhythm: Normal rate and regular rhythm.       Pulses: Normal pulses.      Heart sounds: Normal heart sounds.   Pulmonary:      Effort: Pulmonary effort is normal. No respiratory distress.      Breath sounds: Normal breath sounds.   Musculoskeletal:      Comments: 1+ right lower extremity edema, trace on the left.   Skin:     General: Skin is  warm and dry.   Neurological:      Mental Status: He is alert and oriented to person, place, and time.       LABORATORY DATA:    Labs Ordered Recently:   Office Visit on 12/08/2022   Component Date Value    Glucose 12/08/2022 78     BUN 12/08/2022 13     Creat 12/08/2022 1.95 (H)     eGFR 12/08/2022 39 (L)     BUN/Creat Ratio 12/08/2022 7 (L)     Sodium 12/08/2022 138     Potassium 12/08/2022 5.0     Chloride 12/08/2022 101     Carbon Dioxide 12/08/2022 22     Anion Gap 12/08/2022 15.0     Calcium 12/08/2022 8.9     Phosphorus 12/08/2022 3.6     Albumin 12/08/2022 4.1     Magnesium 12/08/2022 1.8     Albumin Ur 12/08/2022 165.0     Alb/Creat Ratio 12/08/2022 1,514 (H)     Creat Ur 12/08/2022 10.9     Protein Total Ur 12/08/2022 27.7     Protein/Creat Ratio 12/08/2022 2,541 (H)     Spec Gravity, UA, POC 12/08/2022 <=1.005     pH, UA, POC 12/08/2022 7.0     Leukocytes, UA, POC 12/08/2022 Negative     Nitrite, UA, POC 12/08/2022 Negative     Protein, UA, POC 12/08/2022 Negative     Glucose, UA, POC 12/08/2022 Normal     Ketones, UA, POC 12/08/2022 Negative     Urobilinogen, UA, POC 12/08/2022 Normal     Bilirubin, UA, POC 12/08/2022 Negative     Blood, UA, POC 12/08/2022 Negative     WBC 12/08/2022 7.8     RBC 12/08/2022 4.54     Hgb 12/08/2022 14.3     Hct 12/08/2022 42.9     MCV 12/08/2022 95     MCH 12/08/2022 31.5     MCHC 12/08/2022 33.3     RDW 12/08/2022 12.5     Platelets 12/08/2022 253     HgbA1C 12/08/2022 6.4 (H)         The ASCVD Risk score (Arnett DK, et al., 2019) failed to calculate for the following reasons:    The patient has a prior MI or stroke diagnosis    Assessment/Plan    CKD stage G3a/A3, GFR 45-59 and  albumin creatinine ratio >300 mg/g  Historically CKD stage g3a/a3 and a past medical history of HFrEF (BiV dysfunction with ejection fraction of 20-25% and severely reduced right ventricular function in 05/2019), COPD (not on home O2), pulmonary nodules, hypertension, pulmonary hypertension, benign prostatic hypertrophy with lower urinary tract symptoms, type 2 diabetes mellitus, and schizoaffective disorder.  He presented as a new patient in the renal clinic on 08/20/20 for discharge follow-up of ATN. He was admitted from 07/04/20-07/16/20 for acute respiratory failure secondary to heart failure and pneumonia. During that admission, he also developed AKI on CKD stage g3a with a peak creatinine of 4.14 mg/dL from his baseline of 1.4 mg/dL. Urinalysis at the time showed 3+ protein (191 spot protein, 123 spot albumin, Protein/Cr 3571 mg/g, Albumin/Cr 2307 mg/g) and thought his CKD is probably related to type 2 diabetes mellitus and hypertension. Nephrology was consulted during his admission for oliguria in the setting of his AKI, and his acute kidney dysfunction was thought to be pre-renal in the setting of cardiorenal syndrome and he developed post-ATN diuresis. He was given Diamox x1 for alkalosis and bicarbonate improved. His kidney  function improved and his creatinine on discharge was 2.02 mg/dL (eGFR 38); labs were stable at this level on 07/28/21.    eGFR-creatinine is lower than previous check at 39 ml/min (49 ml/min when last seen), serum creatinine 1.95 mg/dl, and improved urine albumin/creatinine ratio of 1514 mg/g (2291 mg/g at the last visit). eGFR-cystatin/creatinine was lower at 35 ml/min when last checked. There is no anemia, acidosis, or electrolyte abnormality. Calcium and phosphorus are normal. We will plan to check SPEP, UPEP, free light chains, and immunofixation at the next visit in three months.    Essential (primary) hypertension  Blood pressure is controlled without symptoms such as headache,  dizziness, lightheadedness, or pre-syncope.    Type 2 diabetes mellitus with diabetic nephropathy, without long-term current use of insulin  Controlled type 2 diabetes mellitus with current hemoglobin A1C of 6.4%.

## 2022-12-09 LAB — MAGNESIUM: Magnesium: 1.8 mg/dL (ref 1.6–2.3)

## 2022-12-09 LAB — PROTEIN / CREATININE RATIO, URINE
Creat Ur: 10.9 mg/dL
Protein Total Ur: 27.7 mg/dL
Protein/Creat Ratio: 2541 mg/g{creat} — ABNORMAL HIGH (ref 0–200)

## 2022-12-09 LAB — RENAL FUNCTION PANEL
Albumin: 4.1 g/dL (ref 3.8–4.9)
Anion Gap: 15 mmol/L (ref 10.0–18.0)
BUN/Creat Ratio: 7 — ABNORMAL LOW (ref 9–20)
BUN: 13 mg/dL (ref 6–24)
Calcium: 8.9 mg/dL (ref 8.7–10.2)
Carbon Dioxide: 22 mmol/L (ref 20–29)
Chloride: 101 mmol/L (ref 96–106)
Creat: 1.95 mg/dL — ABNORMAL HIGH (ref 0.76–1.27)
Glucose: 78 mg/dL (ref 70–99)
Phosphorus: 3.6 mg/dL (ref 2.8–4.1)
Potassium: 5 mmol/L (ref 3.5–5.2)
Sodium: 138 mmol/L (ref 134–144)
eGFR: 39 mL/min/{1.73_m2} — ABNORMAL LOW (ref >59)

## 2022-12-09 LAB — CBC
Hct: 42.9 % (ref 37.5–51.0)
Hgb: 14.3 g/dL (ref 13.0–17.7)
MCH: 31.5 pg (ref 26.6–33.0)
MCHC: 33.3 g/dL (ref 31.5–35.7)
MCV: 95 fL (ref 79–97)
Platelets: 253 10*3/uL (ref 150–450)
RBC: 4.54 x10E6/uL (ref 4.14–5.80)
RDW: 12.5 % (ref 11.6–15.4)
WBC: 7.8 10*3/uL (ref 3.4–10.8)

## 2022-12-09 LAB — HEMOGLOBIN A1C: HgbA1C: 6.4 % — ABNORMAL HIGH (ref 4.8–5.6)

## 2022-12-09 LAB — ALBUMIN, URINE, RANDOM (INCLUDING CREATININE)
Alb/Creat Ratio: 1514 mg/g{creat} — ABNORMAL HIGH (ref 0–29)
Albumin Ur: 165 ug/mL

## 2022-12-21 NOTE — Assessment & Plan Note (Signed)
Controlled type 2 diabetes mellitus with current hemoglobin A1C of 6.4%.

## 2022-12-21 NOTE — Assessment & Plan Note (Signed)
Blood pressure is controlled without symptoms such as headache, dizziness, lightheadedness, or pre-syncope.

## 2022-12-21 NOTE — Assessment & Plan Note (Signed)
Historically CKD stage g3a/a3 and a past medical history of HFrEF (BiV dysfunction with ejection fraction of 20-25% and severely reduced right ventricular function in 05/2019), COPD (not on home O2), pulmonary nodules, hypertension, pulmonary hypertension, benign prostatic hypertrophy with lower urinary tract symptoms, type 2 diabetes mellitus, and schizoaffective disorder.  He presented as a new patient in the renal clinic on 08/20/20 for discharge follow-up of ATN. He was admitted from 07/04/20-07/16/20 for acute respiratory failure secondary to heart failure and pneumonia. During that admission, he also developed AKI on CKD stage g3a with a peak creatinine of 4.14 mg/dL from his baseline of 1.4 mg/dL. Urinalysis at the time showed 3+ protein (191 spot protein, 123 spot albumin, Protein/Cr 3571 mg/g, Albumin/Cr 2307 mg/g) and thought his CKD is probably related to type 2 diabetes mellitus and hypertension. Nephrology was consulted during his admission for oliguria in the setting of his AKI, and his acute kidney dysfunction was thought to be pre-renal in the setting of cardiorenal syndrome and he developed post-ATN diuresis. He was given Diamox x1 for alkalosis and bicarbonate improved. His kidney function improved and his creatinine on discharge was 2.02 mg/dL (eGFR 38); labs were stable at this level on 07/28/21.    eGFR-creatinine is lower than previous check at 39 ml/min (49 ml/min when last seen), serum creatinine 1.95 mg/dl, and improved urine albumin/creatinine ratio of 1514 mg/g (2291 mg/g at the last visit). eGFR-cystatin/creatinine was lower at 35 ml/min when last checked. There is no anemia, acidosis, or electrolyte abnormality. Calcium and phosphorus are normal. We will plan to check SPEP, UPEP, free light chains, and immunofixation at the next visit in three months.

## 2023-03-08 MED ORDER — sodium zirconium cyclosilicate (Lokelma) 5 gram packet
5 | ORAL | 3 refills | 30.00000 days | Status: DC
Start: 2023-03-08 — End: 2023-05-25

## 2023-03-08 NOTE — Telephone Encounter (Signed)
Good afternoon,    Patient called regarding refill for medication sodium zirconium cyclosilicate (Lokelma) 5 gram packet. Patient would like the new prescription to be sent to [facility]. Patient said 76 packet is for 30 days (1 month).     Pharmacy:  [facility] - [place], [place] - [address]     Patient's phone: [phone]

## 2023-03-16 ENCOUNTER — Ambulatory Visit: Attending: Physician Assistant | Primary: Family Medicine

## 2023-03-16 DIAGNOSIS — N1831 Chronic kidney disease, stage 3a (Multi-HCC): Secondary | ICD-10-CM

## 2023-03-16 NOTE — Progress Notes (Deleted)
Primary Care Physician: Berenice Primas     PATIENT IDENTIFIER: Arthur Young is a 57 y.o. male with CKD stage g3a/a3 and a past medical history of HFrEF (BiV dysfunction with ejection fraction of 20-25% and severely reduced right ventricular function in 05/2019), COPD (not on home O2), pulmonary nodules, hypertension, pulmonary hypertension, benign prostatic hypertrophy with lower urinary tract symptoms, type 2 diabetes mellitus, and schizoaffective disorder.  He presented as a new patient in the renal clinic on 08/20/20 for discharge follow-up of ATN. He was admitted from 07/04/20-07/16/20 for acute respiratory failure secondary to heart failure and pneumonia. During that admission, he also developed AKI on CKD stage g3a with a peak creatinine of 4.14 mg/dL from his baseline of 1.4 mg/dL. Urinalysis at the time showed 3+ protein (191 spot protein, 123 spot albumin, Protein/Cr 3571 mg/g, Albumin/Cr 2307 mg/g) and thought his CKD is probably related to type 2 diabetes mellitus and hypertension. Nephrology was consulted during his admission for oliguria in the setting of his AKI, and his acute kidney dysfunction was thought to be pre-renal in the setting of cardiorenal syndrome and he developed post-ATN diuresis. He was given Diamox x1 for alkalosis and bicarbonate improved. His kidney function improved and his creatinine on discharge was 2.02 mg/dL (eGFR 38); labs were stable at this level on 07/28/21.    Subjective    INTERVAL HISTORY:   [facility] was [facility] as a new patient on 12/08/2022.      Since his last visit, Dmitri Pettigrew reports:   - Denies chest pain or dyspnea.  - No headaches or dizziness.  - He can sleep at night, but states that he has to take a nap each day for a few hours. He reports nocturia x1, no other urinary issues.   - Appetite is good, is a vegetarian for the last 7-8 years. Denies nausea, vomiting, diarrhea; he uses several stool softeners for chronic constipation and  denies hematochezia.   - He has itching on lower legs and wrists; has topical lotion that he uses with good effect.   - He has not seen Cardiology in the last two years.  - He smokes a pack of cigarettes a day.    03/16/2023:  -     Patient Active Problem List   Diagnosis    AKI (acute kidney injury) (CMS-HCC)    Benign prostatic hyperplasia with urinary obstruction    Bronchiectasis without complication (Multi-HCC)    Chronic bronchitis (Multi-HCC)    Chronic constipation    Chronic HFrEF (heart failure with reduced ejection fraction) (Multi-HCC)    Chronic venous stasis dermatitis of both lower extremities    Cigarette nicotine dependence without complication    CKD stage G3a/A3, GFR 45-59 and albumin creatinine ratio >300 mg/g (Multi-HCC)    Dysarthria    Enlarged prostate    Gammopathy, monoclonal    Hyperkalemia    Hypermetropia of both eyes    Hypertriglyceridemia (CMS-HCC)    Macrocytosis without anemia    Male erectile disorder    Multiple lung nodules on CT    Paranoid schizophrenia (Multi-HCC)    Polycythemia, secondary    Essential (primary) hypertension (CMS-HCC)    Primary open-angle glaucoma (CMS-HCC)    Pulmonary hypertension (Multi-HCC)    Rectal adenocarcinoma (Multi-HCC)    Schizoaffective disorder (Multi-HCC)    Severe chronic obstructive pulmonary disease (Multi-HCC)    Type 2 diabetes mellitus with diabetic nephropathy, without long-term current use of insulin (Multi-HCC)  Urinary retention    Vitamin D deficiency    Seizure disorder (Multi-HCC)     Current Outpatient Medications   Medication Instructions    albuterol 90 mcg/actuation inhaler 2 puffs, inhalation    ammonium lactate (Lac-Hydrin) 12 % lotion SMARTSIG:Application Topical    aspirin 81 mg, oral, Every morning    atorvastatin (LIPITOR) 80 mg, oral, Nightly    benzonatate (TESSALON) 200 mg, oral, Every 8 hours PRN    bisacodyl (DULCOLAX) 10 mg, oral, Daily PRN    brimonidine (AlphaGAN P) 0.15 % ophthalmic solution 1 drop, ophthalmic  (eye), 3 times daily    carBAMazepine (TEGRETOL) 200 mg, oral, 2 times daily    diclofenac (Voltaren) 1 % topical gel topical (top)    dorzolamide (Trusopt) 2 % ophthalmic solution 1 drop, ophthalmic (eye), 2 times daily    Enulose 10 gram/15 mL solution SMARTSIG:15 Milliliter(s) By Mouth Every Morning    fluticasone-umeclidin-vilanter (Trelegy Ellipta) 100-62.5-25 mcg blister with device 1 Inhalation, inhalation, Daily RT    furosemide (LASIX) 40 mg, oral, Every morning    hydrocortisone 2.5 % cream SMARTSIG:Topical    lisinopril 10 mg, oral, Daily    metFORMIN XR (GLUCOPHAGE-XR) 500 mg, oral, 2 times daily    metoprolol succinate XL (Toprol-XL) 50 mg 24 hr tablet TAKE ONE TABLET BY MOUTH ONCE DAILY    mirtazapine (REMERON) 15 mg, oral, Nightly    OLANZapine (ZYPREXA) 10 mg, oral, Nightly    QUEtiapine (SEROQUEL) 400 mg, oral    senna 8.6 mg tablet 2 tablets, oral, 2 times daily    sodium zirconium cyclosilicate (Lokelma) 5 gram packet TAKE 5 GRAMS BY MOUTH EVERY OTHER DAY    Stool Softener 100 mg, oral, 2 times daily    tamsulosin (FLOMAX) 0.4 mg, oral, Nightly     Allergies   Allergen Reactions    Divalproex      Other reaction(s): 40 lb wt gain  DEPAKOTE 40 lb wt gain--    Lithium      No past medical history on file.  No past surgical history on file.  No family history on file.  Social History     Tobacco Use    Smoking status: Every Day     Packs/day: 1.00     Years: 35.00     Additional pack years: 0.00     Total pack years: 35.00     Types: Cigarettes    Smokeless tobacco: Never   Substance Use Topics    Alcohol use: Never    Drug use: Never       Objective    PHYSICAL EXAM:   There were no vitals filed for this visit.         Physical Exam  Vitals reviewed.   Constitutional:       General: He is not in acute distress.     Appearance: He is not ill-appearing.   Cardiovascular:      Rate and Rhythm: Normal rate and regular rhythm.      Pulses: Normal pulses.      Heart sounds: Normal heart sounds.    Pulmonary:      Effort: Pulmonary effort is normal. No respiratory distress.      Breath sounds: Normal breath sounds.   Musculoskeletal:      Comments: 1+ right lower extremity edema, trace on the left.   Skin:     General: Skin is warm and dry.   Neurological:      Mental Status: He  is alert and oriented to person, place, and time.       LABORATORY DATA:    Labs Ordered Recently:   No visits with results within 1 Week(s) from this visit.   Latest known visit with results is:   Office Visit on 12/08/2022   Component Date Value    Glucose 12/08/2022 78     BUN 12/08/2022 13     Creat 12/08/2022 1.95 (H)     eGFR 12/08/2022 39 (L)     BUN/Creat Ratio 12/08/2022 7 (L)     Sodium 12/08/2022 138     Potassium 12/08/2022 5.0     Chloride 12/08/2022 101     Carbon Dioxide 12/08/2022 22     Anion Gap 12/08/2022 15.0     Calcium 12/08/2022 8.9     Phosphorus 12/08/2022 3.6     Albumin 12/08/2022 4.1     Magnesium 12/08/2022 1.8     Albumin Ur 12/08/2022 165.0     Alb/Creat Ratio 12/08/2022 1,514 (H)     Creat Ur 12/08/2022 10.9     Protein Total Ur 12/08/2022 27.7     Protein/Creat Ratio 12/08/2022 2,541 (H)     Spec Gravity, UA, POC 12/08/2022 <=1.005     pH, UA, POC 12/08/2022 7.0     Leukocytes, UA, POC 12/08/2022 Negative     Nitrite, UA, POC 12/08/2022 Negative     Protein, UA, POC 12/08/2022 Negative     Glucose, UA, POC 12/08/2022 Normal     Ketones, UA, POC 12/08/2022 Negative     Urobilinogen, UA, POC 12/08/2022 Normal     Bilirubin, UA, POC 12/08/2022 Negative     Blood, UA, POC 12/08/2022 Negative     WBC 12/08/2022 7.8     RBC 12/08/2022 4.54     Hgb 12/08/2022 14.3     Hct 12/08/2022 42.9     MCV 12/08/2022 95     MCH 12/08/2022 31.5     MCHC 12/08/2022 33.3     RDW 12/08/2022 12.5     Platelets 12/08/2022 253     HgbA1C 12/08/2022 6.4 (H)         The ASCVD Risk score (Arnett DK, et al., 2019) failed to calculate for the following reasons:    The patient has a prior MI or stroke diagnosis    Assessment/Plan     No problem-specific Assessment & Plan notes found for this encounter.

## 2023-03-17 NOTE — Telephone Encounter (Signed)
Department Name: nephrology    Patient: Arthur Young  MRN: [id number]  Agent: Veva Holes    [facility] Scheduling Message    Patient called to request a refill CQ:[id number] 5 grams          Pharmacy:  pharm  correct in epic     Informed patient it can take 3 business days to send prescription refill to pharmacy.    Call back number: [phone]

## 2023-03-17 NOTE — Telephone Encounter (Signed)
This had been sent to his pharmacy [facility]) on 03/08/23. He should call the pharmacy.    [facility]

## 2023-03-18 ENCOUNTER — Ambulatory Visit: Payer: PRIVATE HEALTH INSURANCE | Attending: Internal Medicine | Primary: Family Medicine

## 2023-03-24 NOTE — Telephone Encounter (Signed)
This was already sent on 03/05/23. He should call the pharmacy.    [facility]

## 2023-03-25 ENCOUNTER — Ambulatory Visit
Admit: 2023-03-25 | Discharge: 2023-03-25 | Payer: PRIVATE HEALTH INSURANCE | Attending: Internal Medicine | Primary: Family Medicine

## 2023-03-25 DIAGNOSIS — J432 Centrilobular emphysema: Secondary | ICD-10-CM

## 2023-03-25 MED ORDER — albuterol 90 mcg/actuation inhaler
90 | Freq: Four times a day (QID) | RESPIRATORY_TRACT | 3 refills | Status: AC | PRN
Start: 2023-03-25 — End: 2023-06-08

## 2023-03-25 MED ORDER — fluticasone-umeclidin-vilanter (Trelegy Ellipta) 100-62.5-25 mcg blister with device
100-62.5-25 | Freq: Every day | RESPIRATORY_TRACT | 3 refills | Status: AC
Start: 2023-03-25 — End: 2023-06-08
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
## 2023-03-25 NOTE — Telephone Encounter (Signed)
I called the pharmacy, they already have the refill. The patient just needs to either call the pharmacy or go there to get it.     Arthur Young

## 2023-03-26 NOTE — Telephone Encounter (Signed)
Thanks, Alvine.

## 2023-04-13 ENCOUNTER — Ambulatory Visit: Payer: PRIVATE HEALTH INSURANCE | Attending: Physician Assistant | Primary: Family Medicine

## 2023-04-20 ENCOUNTER — Ambulatory Visit: Payer: PRIVATE HEALTH INSURANCE | Attending: Physician Assistant | Primary: Family Medicine

## 2023-04-20 DIAGNOSIS — N1831 Chronic kidney disease, stage 3a (Multi-HCC): Secondary | ICD-10-CM

## 2023-04-20 NOTE — Progress Notes (Deleted)
Primary Care Physician: Etter Sjogren, MD     PATIENT IDENTIFIER: Arthur Young is a 57 y.o. male with CKD stage g3a/a3 and a past medical history of HFrEF (BiV dysfunction with ejection fraction of 20-25% and severely reduced right ventricular function in 05/2019), COPD (not on home O2), pulmonary nodules, hypertension, pulmonary hypertension, benign prostatic hypertrophy with lower urinary tract symptoms, type 2 diabetes mellitus, and schizoaffective disorder.  He presented as a new patient in the renal clinic on 08/20/20 for discharge follow-up of ATN. He was admitted from 07/04/20-07/16/20 for acute respiratory failure secondary to heart failure and pneumonia. During that admission, he also developed AKI on CKD stage g3a with a peak creatinine of 4.14 mg/dL from his baseline of 1.4 mg/dL. Urinalysis at the time showed 3+ protein (191 spot protein, 123 spot albumin, Protein/Cr 3571 mg/g, Albumin/Cr 2307 mg/g) and thought his CKD is probably related to type 2 diabetes mellitus and hypertension. Nephrology was consulted during his admission for oliguria in the setting of his AKI, and his acute kidney dysfunction was thought to be pre-renal in the setting of cardiorenal syndrome and he developed post-ATN diuresis. He was given Diamox x1 for alkalosis and bicarbonate improved. His kidney function improved and his creatinine on discharge was 2.02 mg/dL (eGFR 38); labs were stable at this level on 07/28/21.    Subjective    INTERVAL HISTORY:   [facility] was [facility] as [facility] new patient on 12/08/2022.      Since his last visit, Arthur Young reports:   - Denies chest pain or dyspnea.  - No headaches or dizziness.  - He can sleep at night, but states that he has to take a nap each day for a few hours. He reports nocturia x1, no other urinary issues.   - Appetite is good, is a vegetarian for the last 7-8 years. Denies nausea, vomiting, diarrhea; he uses several stool softeners for chronic  constipation and denies hematochezia.   - He has itching on lower legs and wrists; has topical lotion that he uses with good effect.   - He has not seen Cardiology in the last two years.  - He smokes a pack of cigarettes a day.    04/20/2023:  -     Patient Active Problem List   Diagnosis    AKI (acute kidney injury) (CMS-HCC)    Benign prostatic hyperplasia with urinary obstruction    Bronchiectasis without complication (Multi-HCC)    Chronic bronchitis (Multi-HCC)    Chronic constipation    Chronic HFrEF (heart failure with reduced ejection fraction) (Multi-HCC)    Chronic venous stasis dermatitis of both lower extremities    Cigarette nicotine dependence without complication    CKD stage G3a/A3, GFR 45-59 and albumin creatinine ratio >300 mg/g (Multi-HCC)    Dysarthria    Enlarged prostate    Gammopathy, monoclonal    Hyperkalemia    Hypermetropia of both eyes    Hypertriglyceridemia (CMS-HCC)    Macrocytosis without anemia    Male erectile disorder    Multiple lung nodules on CT    Paranoid schizophrenia (Multi-HCC)    Polycythemia, secondary    Essential (primary) hypertension (CMS-HCC)    Primary open-angle glaucoma (CMS-HCC)    Pulmonary hypertension (Multi-HCC)    Rectal adenocarcinoma (Multi-HCC)    Schizoaffective disorder (Multi-HCC)    Severe chronic obstructive pulmonary disease (Multi-HCC)    Type 2 diabetes mellitus with diabetic nephropathy, without long-term current use of  insulin (Multi-HCC)    Urinary retention    Vitamin D deficiency    Seizure disorder (Multi-HCC)     Current Outpatient Medications   Medication Instructions    albuterol 90 mcg/actuation inhaler 2 puffs, inhalation, Every 6 hours PRN    ammonium lactate (Lac-Hydrin) 12 % lotion SMARTSIG:Application Topical    aspirin 81 mg, oral, Every morning    atorvastatin (LIPITOR) 80 mg, oral, Nightly    benzonatate (TESSALON) 200 mg, oral, Every 8 hours PRN    bisacodyl (DULCOLAX) 10 mg, oral, Daily PRN    brimonidine (AlphaGAN P) 0.15 %  ophthalmic solution 1 drop, ophthalmic (eye), 3 times daily    carBAMazepine (TEGRETOL) 200 mg, oral, 2 times daily    diclofenac (Voltaren) 1 % topical gel topical (top)    dorzolamide (Trusopt) 2 % ophthalmic solution 1 drop, ophthalmic (eye), 2 times daily    Enulose 10 gram/15 mL solution SMARTSIG:15 Milliliter(s) By Mouth Every Morning    fluticasone-umeclidin-vilanter (Trelegy Ellipta) 100-62.5-25 mcg blister with device 1 Inhalation, inhalation, Daily RT    furosemide (LASIX) 40 mg, oral, Every morning    hydrocortisone 2.5 % cream SMARTSIG:Topical    lisinopril 10 mg, oral, Daily    metFORMIN XR (GLUCOPHAGE-XR) 500 mg, oral, 2 times daily    metoprolol succinate XL (Toprol-XL) 50 mg 24 hr tablet TAKE ONE TABLET BY MOUTH ONCE DAILY    mirtazapine (REMERON) 15 mg, oral, Nightly    OLANZapine (ZYPREXA) 10 mg, oral, Nightly    QUEtiapine (SEROQUEL) 400 mg, oral    senna 8.6 mg tablet 2 tablets, oral, 2 times daily    sodium zirconium cyclosilicate (Lokelma) 5 gram packet TAKE 5 GRAMS BY MOUTH EVERY OTHER DAY    Stool Softener 100 mg, oral, 2 times daily    tamsulosin (FLOMAX) 0.4 mg, oral, Nightly     Allergies   Allergen Reactions    Divalproex      Other reaction(s): 40 lb wt gain  DEPAKOTE 40 lb wt gain--    Lithium      No past medical history on file.  No past surgical history on file.  No family history on file.  Social History     Tobacco Use    Smoking status: Every Day     Current packs/day: 1.00     Average packs/day: 1 pack/day for 35.0 years (35.0 ttl pk-yrs)     Types: Cigarettes    Smokeless tobacco: Never   Substance Use Topics    Alcohol use: Never    Drug use: Never       Objective    PHYSICAL EXAM:   There were no vitals filed for this visit.         Physical Exam  Vitals reviewed.   Constitutional:       General: He is not in acute distress.     Appearance: He is not ill-appearing.   Cardiovascular:      Rate and Rhythm: Normal rate and regular rhythm.      Pulses: Normal pulses.      Heart  sounds: Normal heart sounds.   Pulmonary:      Effort: Pulmonary effort is normal. No respiratory distress.      Breath sounds: Normal breath sounds.   Musculoskeletal:      Comments: 1+ right lower extremity edema, trace on the left.   Skin:     General: Skin is warm and dry.   Neurological:      Mental  Status: He is alert and oriented to person, place, and time.       LABORATORY DATA:    Labs Ordered Recently:   No visits with results within 1 Week(s) from this visit.   Latest known visit with results is:   Office Visit on 12/08/2022   Component Date Value    Glucose 12/08/2022 78     BUN 12/08/2022 13     Creat 12/08/2022 1.95 (H)     eGFR 12/08/2022 39 (L)     BUN/Creat Ratio 12/08/2022 7 (L)     Sodium 12/08/2022 138     Potassium 12/08/2022 5.0     Chloride 12/08/2022 101     Carbon Dioxide 12/08/2022 22     Anion Gap 12/08/2022 15.0     Calcium 12/08/2022 8.9     Phosphorus 12/08/2022 3.6     Albumin 12/08/2022 4.1     Magnesium 12/08/2022 1.8     Albumin Ur 12/08/2022 165.0     Alb/Creat Ratio 12/08/2022 1,514 (H)     Creat Ur 12/08/2022 10.9     Protein Total Ur 12/08/2022 27.7     Protein/Creat Ratio 12/08/2022 2,541 (H)     Spec Gravity, UA, POC 12/08/2022 <=1.005     pH, UA, POC 12/08/2022 7.0     Leukocytes, UA, POC 12/08/2022 Negative     Nitrite, UA, POC 12/08/2022 Negative     Protein, UA, POC 12/08/2022 Negative     Glucose, UA, POC 12/08/2022 Normal     Ketones, UA, POC 12/08/2022 Negative     Urobilinogen, UA, POC 12/08/2022 Normal     Bilirubin, UA, POC 12/08/2022 Negative     Blood, UA, POC 12/08/2022 Negative     WBC 12/08/2022 7.8     RBC 12/08/2022 4.54     Hgb 12/08/2022 14.3     Hct 12/08/2022 42.9     MCV 12/08/2022 95     MCH 12/08/2022 31.5     MCHC 12/08/2022 33.3     RDW 12/08/2022 12.5     Platelets 12/08/2022 253     HgbA1C 12/08/2022 6.4 (H)         The ASCVD Risk score (Arnett DK, et al., 2019) failed to calculate for the following reasons:    The patient has a prior MI or stroke  diagnosis    Assessment/Plan    No problem-specific Assessment & Plan notes found for this encounter.

## 2023-05-25 ENCOUNTER — Ambulatory Visit: Payer: PRIVATE HEALTH INSURANCE | Attending: Physician Assistant | Primary: Family Medicine

## 2023-05-25 ENCOUNTER — Ambulatory Visit
Admit: 2023-05-25 | Discharge: 2023-05-25 | Payer: PRIVATE HEALTH INSURANCE | Attending: Student in an Organized Health Care Education/Training Program | Primary: Family Medicine

## 2023-05-25 DIAGNOSIS — N1831 Chronic kidney disease, stage 3a (Multi-HCC): Secondary | ICD-10-CM

## 2023-05-25 LAB — POCT URINALYSIS DIPSTICK
Bilirubin, UA, POC: NEGATIVE
Blood, UA, POC: NEGATIVE
Glucose, UA, POC: NORMAL
Ketones, UA, POC: NEGATIVE
Leukocytes, UA, POC: NEGATIVE
Nitrite, UA, POC: NEGATIVE
Spec Gravity, UA, POC: <=1.005 (ref 1.001–1.035)
Urobilinogen, UA, POC: NORMAL
pH, UA, POC: 5 (ref 5.0–8.0)

## 2023-05-25 MED ORDER — sodium zirconium cyclosilicate (Lokelma) 5 gram packet
5 | ORAL | 3 refills | 30.00000 days | Status: DC
Start: 2023-05-25 — End: 2023-06-01

## 2023-05-25 NOTE — Progress Notes (Signed)
Primary Care Physician: Etter Sjogren, MD     PATIENT IDENTIFIER: Arthur Young is a 58 y.o. male with CKD stage g3a/a3 and a past medical history of HFrEF (BiV dysfunction with ejection fraction of 20-25% and severely reduced right ventricular function in 05/2019), COPD (not on home O2), pulmonary nodules, hypertension, pulmonary hypertension, benign prostatic hypertrophy with lower urinary tract symptoms, type 2 diabetes mellitus, and schizoaffective disorder.  He presented as a new patient in the renal clinic on 08/20/20 for discharge follow-up of ATN. He was admitted from 07/04/20-07/16/20 for acute respiratory failure secondary to heart failure and pneumonia. During that admission, he also developed AKI on CKD stage g3a with a peak creatinine of 4.14 mg/dL from his baseline of 1.4 mg/dL. Urinalysis at the time showed 3+ protein (191 spot protein, 123 spot albumin, Protein/Cr 3571 mg/g, Albumin/Cr 2307 mg/g) and thought his CKD is probably related to type 2 diabetes mellitus and hypertension. Nephrology was consulted during his admission for oliguria in the setting of his AKI, and his acute kidney dysfunction was thought to be pre-renal in the setting of cardiorenal syndrome and he developed post-ATN diuresis. He was given Diamox x1 for alkalosis and bicarbonate improved. His kidney function improved and his creatinine on discharge was 2.02 mg/dL (eGFR 38); labs were stable at this level on 07/28/21.    Subjective    INTERVAL HISTORY:   He was last seen [facility] as a new patient on 11/28/2022.      Since his last visit, Husam Hohn reports:     Presents with group home representative   Requesting kidney disease be removed from problem list in EHR  Asking for lokelma refill   No localizing symptoms   Actively smoking cigarettes     Notably, patient presented to ER 05/28/23 after visit, found to have acute urinary obstruction s/p foley catheter placement     Patient Active Problem List    Diagnosis    AKI (acute kidney injury)    Benign prostatic hyperplasia with urinary obstruction    Bronchiectasis without complication (Multi-HCC)    Chronic bronchitis (Multi-HCC)    Chronic constipation    Chronic HFrEF (heart failure with reduced ejection fraction) (Multi-HCC)    Chronic venous stasis dermatitis of both lower extremities    Cigarette nicotine dependence without complication    CKD stage G3a/A3, GFR 45-59 and albumin creatinine ratio >300 mg/g (Multi-HCC)    Dysarthria    Enlarged prostate    Gammopathy, monoclonal    Hyperkalemia    Hypermetropia of both eyes    Hypertriglyceridemia    Macrocytosis without anemia    Male erectile disorder    Multiple lung nodules on CT    Paranoid schizophrenia (Multi-HCC)    Polycythemia, secondary    Essential (primary) hypertension    Primary open-angle glaucoma    Pulmonary hypertension (Multi-HCC)    Rectal adenocarcinoma (Multi-HCC)    Schizoaffective disorder (Multi-HCC)    Severe chronic obstructive pulmonary disease (Multi-HCC)    Type 2 diabetes mellitus with diabetic nephropathy, without long-term current use of insulin (Multi-HCC)    Urinary retention    Vitamin D deficiency    Seizure disorder (Multi-HCC)     Current Outpatient Medications   Medication Instructions    albuterol 90 mcg/actuation inhaler 2 puffs, inhalation, Every 6 hours PRN    ammonium lactate (Lac-Hydrin) 12 % lotion SMARTSIG:Application Topical    aspirin 81 mg, oral, Every morning    atorvastatin (  LIPITOR) 80 mg, oral, Nightly    benzonatate (TESSALON) 200 mg, oral, Every 8 hours PRN    bisacodyl (DULCOLAX) 10 mg, oral, Daily PRN    brimonidine (AlphaGAN P) 0.15 % ophthalmic solution 1 drop, ophthalmic (eye), 3 times daily    carBAMazepine (TEGRETOL) 200 mg, oral, 2 times daily    diclofenac (Voltaren) 1 % topical gel topical (top)    dorzolamide (Trusopt) 2 % ophthalmic solution 1 drop, ophthalmic (eye), 2 times daily    Enulose 10 gram/15 mL solution SMARTSIG:15 Milliliter(s)  By Mouth Every Morning    finasteride (PROSCAR) 5 mg, oral, Daily, Do not crush, chew, or split.    fluticasone-umeclidin-vilanter (Trelegy Ellipta) 100-62.5-25 mcg blister with device 1 Inhalation, inhalation, Daily RT    furosemide (LASIX) 40 mg, oral, Every morning    hydrocortisone 2.5 % cream SMARTSIG:Topical    metFORMIN XR (GLUCOPHAGE-XR) 500 mg, oral, 2 times daily    metoprolol succinate XL (Toprol-XL) 50 mg 24 hr tablet TAKE ONE TABLET BY MOUTH ONCE DAILY    mirtazapine (REMERON) 15 mg, oral, Nightly    OLANZapine (ZYPREXA) 10 mg, oral, Nightly    QUEtiapine (SEROQUEL) 400 mg, oral    senna 8.6 mg tablet 2 tablets, oral, 2 times daily    sodium zirconium cyclosilicate (LOKELMA) 10 g, oral, Daily, TAKE 5 GRAMS BY MOUTH EVERY OTHER DAY    Stool Softener 100 mg, oral, 2 times daily    tamsulosin (FLOMAX) 0.4 mg, oral, Nightly     Allergies   Allergen Reactions    Divalproex      Other reaction(s): 40 lb wt gain  DEPAKOTE 40 lb wt gain--    Lithium      No past medical history on file.  No past surgical history on file.  No family history on file.  Social History     Tobacco Use    Smoking status: Every Day     Current packs/day: 1.00     Average packs/day: 1 pack/day for 35.0 years (35.0 ttl pk-yrs)     Types: Cigarettes    Smokeless tobacco: Never   Substance Use Topics    Alcohol use: Never    Drug use: Never       Objective    PHYSICAL EXAM:   Vitals:    05/25/23 1301   BP: 101/55   Pulse: 84   Temp: 36.2 C (97.2 F)   TempSrc: Temporal   Weight: 62.6 kg   Height: 1.575 m     Physical Exam  Vitals reviewed.   Constitutional:       General: He is not in acute distress.     Appearance: He is not ill-appearing.   Cardiovascular:      Rate and Rhythm: Normal rate and regular rhythm.      Pulses: Normal pulses.      Heart sounds: Normal heart sounds.   Pulmonary:      Effort: Pulmonary effort is normal. No respiratory distress.      Breath sounds: Normal breath sounds.   Musculoskeletal:         General: No  swelling.   Skin:     General: Skin is warm and dry.   Neurological:      Mental Status: He is alert and oriented to person, place, and time.       LABORATORY DATA:  CKD Labs: eGFR and electrolytes:   Lab Results   Component Value Date    NA 140 06/01/2023  K 4.5 06/01/2023    CL 106 06/01/2023    CO2 24 06/01/2023    BUN 22 06/01/2023    CREATININE 1.77 (H) 06/01/2023    CREATININE 1.84 (H) 05/31/2023    CREATININE 2.01 (H) 05/30/2023    EGFR 44 (L) 06/01/2023    EGFR 42 (L) 05/31/2023    EGFR 38 (L) 05/30/2023    GLUCOSE 126 06/01/2023    CALCIUM 8.9 06/01/2023    PHOS 4.2 06/01/2023     Anemia:   Lab Results   Component Value Date    WBC 11.2 (H) 06/01/2023    HGB 12.2 (L) 06/01/2023    HCT 38.9 06/01/2023    PLT 208 06/01/2023    MCV 98.2 06/01/2023     Mineral Bone Metabolism:   Lab Results   Component Value Date    PTH 187 (H) 05/25/2023    PTH 179.0 (H) 09/02/2021    PTH 280.0 (H) 08/20/2020    VITD25 24 09/02/2021     Urine Albumin/Protein:   Lab Results   Component Value Date    MICROALBCREA 1,044 (H) 05/25/2023    MICROALBCREA 1,514 (H) 12/08/2022    MICROALBCREA 2,291 (H) 09/02/2021    UTPCR 2,035 (H) 05/25/2023    UTPCR 2,541 (H) 12/08/2022    UTPCR 3,464 (H) 09/02/2021    UTPCR 4,270 (H) 09/02/2021     Urine POC:   Lab Results   Component Value Date    SPECGRAV <=1.005 05/25/2023    PHUR 6.5 05/28/2023    WBCUR >100 (H) 05/28/2023    RBCUR 8 (H) 05/28/2023    KETONESU Negative 05/25/2023    BILIRUBINUR Negative 05/25/2023    PROTUR 2+ (A) 05/28/2023    GLUCOSEUR Normal 05/25/2023     Other:  Lab Results   Component Value Date    HGBA1C 6.0 (H) 05/28/2023    HGBA1C 6.4 (H) 12/08/2022     Lab Results   Component Value Date    SPEP  09/02/2021      Comment:      Mild increase in alpha 2 serum globulins.  Small abnormal restricted band in the beta-gamma interface consistent with a monoclonal protein.  Too small to quantitate.  This small band is within a polyclonal immunoglobulin  background.  Immunofixation electrophoresis to follow for further evaluation.    Initial assessment by Dr. Wilder Glade.  Final interpretation by Dr. Vonita Moss.    FKAPPA 92.5 (H) 09/02/2021    FLAMBDA 199.0 (H) 09/02/2021    UPEP  09/02/2021      Comment:      Minimal proteinuria.  Predominantly albumin.  Pattern otherwise not remarkable.  Immunofixation electrophoresis to follow for further evaluation.    Initial assessment by Dr. Wilder Glade.  Final interpretation by Dr. Vonita Moss.     Assessment/Plan    CKD stage G3a/A3, GFR 45-59 and albumin creatinine ratio >300 mg/g  Mr. Deny Chevez is a 58 y.o.M w/ CKD G3aA3 presumed secondary to T2DM/HTN, also has HFrEF (BiV dysfunction with ejection fraction of 20-25% and severely reduced right ventricular function in 05/2019), COPD (not on home O2) actively smoking tobacco, pHTN, BPH w/ LUTS, and schizoaffective disorder.   Kidney function: Patient's sCr 2.21 with corresponding eGFRcr 33 ml/min/1.49m2, UACR overall dowtrending ~1 g/g. AKI on CKD present, in retrospect in the presence of BOO s/p foley placement during hospital admission after our visit. Serum creatinine since downtrending. Paraprotein evaluation sent at today's visit, notable for faint IgA light  chain in serum and positive M-spike in urine 3.7% +Bence-jones protein. Will review at follow-up visit.   Electrolytes/Acid/Base: K WNLs with Lokelma; bicarb WNLs.   Volume management: Euvolemic with current lasix dose.   Mineral bone disease management: Ca, phos WNLs. PTH dowtrended/stable since 06/2020. Continue to monitor trend.   Anemia management: Mild anemia present, Hb 12.2. No indication for ESA.   Blood pressure: SBP is at goal <130, may be a little low. Admitted shortly after visit with ACEi being held.   CKD therapies: ACEi held for AKI. SGLT2i may be considered if/when eGFR stabilizes if urologic issues/UTIs are not recurrent. Patient is a candidate for GLP1-RA therapy, may be considered  in the future.  Disease-specific therapies: DM at goal <7, HTN addressed above.   CVD risk reduction: Continue ASA, high-intensity statin for secondary prevention.   Return to clinic: Post-hospital f/u in 3 weeks     Diagnosis Plan   1. CKD stage G3a/A3, GFR 45-59 and albumin creatinine ratio >300 mg/g (Multi-HCC)  Renal Function Panel (BMP + Ca/Phos/Alb)    Cystatin C    Protein electrophoresis, serum    Free Light Chains (Free Kappa, Free Lambda)    Immunofixation electrophoresis    Immunofixation (IFE) and Protein Electrophoresis, Random Urine    PTH, intact    CBC, no diff    Albumin, Urine, Random (Microalbumin Random, Including Creatinine)    Protein/Creatinine ratio, urine    POCT urinalysis dipstick manually resulted      2. Hyperkalemia  Basic Metabolic Panel    Basic Metabolic Panel      3. Chronic HFrEF (heart failure with reduced ejection fraction) (Multi-HCC)

## 2023-05-26 LAB — CBC
Hct: 40.2 % (ref 37.5–51.0)
Hgb: 12.8 g/dL — ABNORMAL LOW (ref 13.0–17.7)
MCH: 31.1 pg (ref 26.6–33.0)
MCHC: 31.8 g/dL (ref 31.5–35.7)
MCV: 98 fL — ABNORMAL HIGH (ref 79–97)
Platelets: 232 10*3/uL (ref 150–450)
RBC: 4.11 x10E6/uL — ABNORMAL LOW (ref 4.14–5.80)
RDW: 13.1 % (ref 11.6–15.4)
WBC: 9.3 10*3/uL (ref 3.4–10.8)

## 2023-05-26 LAB — PTH, INTACT: PTH, Intact: 187 pg/mL — ABNORMAL HIGH (ref 15–65)

## 2023-05-26 LAB — RENAL FUNCTION PANEL
Albumin: 4.2 g/dL (ref 3.8–4.9)
Anion Gap: 15 mmol/L (ref 10.0–18.0)
BUN/Creat Ratio: 10 (ref 9–20)
BUN: 21 mg/dL (ref 6–24)
Calcium: 8.7 mg/dL (ref 8.7–10.2)
Carbon Dioxide: 22 mmol/L (ref 20–29)
Chloride: 110 mmol/L — ABNORMAL HIGH (ref 96–106)
Creat: 2.21 mg/dL — ABNORMAL HIGH (ref 0.76–1.27)
Glucose: 112 mg/dL — ABNORMAL HIGH (ref 70–99)
Phosphorus: 4.9 mg/dL — ABNORMAL HIGH (ref 2.8–4.1)
Potassium: 5.8 mmol/L — ABNORMAL HIGH (ref 3.5–5.2)
Sodium: 147 mmol/L — ABNORMAL HIGH (ref 134–144)
eGFR: 33 mL/min/{1.73_m2} — ABNORMAL LOW (ref >59)

## 2023-05-26 LAB — FREE LIGHT CHAINS (KAPPA, LAMBDA)
Fr Kappa Lt Chains: 83.2 mg/L — ABNORMAL HIGH (ref 3.3–19.4)
Fr Lambda Lt Chains: 175.6 mg/L — ABNORMAL HIGH (ref 5.7–26.3)
Kappa/Lambda Ratio: 0.47 (ref 0.26–1.65)

## 2023-05-26 LAB — CYSTATIN C
Cystatin C: 2.56 mg/L — ABNORMAL HIGH (ref 0.67–1.14)
eGFRcr-cys: 27 mL/min/{1.73_m2} — ABNORMAL LOW (ref >59)

## 2023-05-27 LAB — IMMUNOFIXATION ELECTROPHORESIS, SERUM
IgA: 472 mg/dL — ABNORMAL HIGH (ref 90–386)
IgG: 622 mg/dL (ref 603–1613)
IgM: 38 mg/dL (ref 20–172)

## 2023-05-27 LAB — PROTEIN ELECTROPHORESIS, SERUM
A/G Ratio: 1.2 (ref 0.7–1.7)
Albumin: 3.4 g/dL (ref 2.9–4.4)
Alpha1 Glob: 0.2 g/dL (ref 0.0–0.4)
Alpha2 Glob: 0.8 g/dL (ref 0.4–1.0)
Beta Glob: 0.9 g/dL (ref 0.7–1.3)
Gamma Glob: 0.8 g/dL (ref 0.4–1.8)
Glob Total: 2.8 g/dL (ref 2.2–3.9)
MSpike: 0.2 g/dL — ABNORMAL HIGH
Protein Total: 6.2 g/dL (ref 6.0–8.5)

## 2023-05-27 LAB — ALBUMIN, URINE, RANDOM (INCLUDING CREATININE)
Alb/Creat Ratio: 1044 mg/g{creat} — ABNORMAL HIGH (ref 0–29)
Albumin Ur: 323.7 ug/mL

## 2023-05-27 LAB — PROTEIN / CREATININE RATIO, URINE
Creat Ur: 31 mg/dL
Protein/Creat Ratio: 2035 mg/g{creat} — ABNORMAL HIGH (ref 0–200)

## 2023-05-28 ENCOUNTER — Encounter: Admit: 2023-05-28 | Discharge: 2023-05-28 | Payer: PRIVATE HEALTH INSURANCE | Primary: Family Medicine

## 2023-05-28 ENCOUNTER — Inpatient Hospital Stay
Admission: EM | Admit: 2023-05-28 | Discharge: 2023-06-01 | Disposition: A | Discharge status: Residential Facility | Payer: PRIVATE HEALTH INSURANCE | Admitting: Nephrology

## 2023-05-28 DIAGNOSIS — R338 Other retention of urine: Secondary | ICD-10-CM

## 2023-05-28 DIAGNOSIS — N3000 Acute cystitis without hematuria: Secondary | ICD-10-CM

## 2023-05-28 LAB — COMPREHENSIVE METABOLIC PANEL
ALT: 9 U/L (ref 0–55)
AST: 19 U/L (ref 6–42)
Albumin: 4.6 g/dL (ref 3.2–5.0)
Alkaline phosphatase: 147 U/L — ABNORMAL HIGH (ref 30–130)
Anion Gap: 14 mmol/L (ref 3–14)
BUN: 23 mg/dL (ref 6–24)
Bilirubin, total: 0.2 mg/dL (ref 0.2–1.2)
CO2 (Bicarbonate): 22 mmol/L (ref 20–32)
Calcium: 9.3 mg/dL (ref 8.5–10.5)
Chloride: 100 mmol/L (ref 98–110)
Creatinine: 2.06 mg/dL — ABNORMAL HIGH (ref 0.55–1.30)
Glucose: 120 mg/dL (ref 70–139)
Potassium: 5.9 mmol/L — ABNORMAL HIGH (ref 3.6–5.2)
Protein, total: 7.8 g/dL (ref 6.0–8.4)
Sodium: 136 mmol/L (ref 135–146)
eGFRcr: 36 mL/min/{1.73_m2} — ABNORMAL LOW (ref >=60)

## 2023-05-28 LAB — URINALYSIS REFLEX TO CULTURE
Bilirubin, Ur: NEGATIVE
Glucose, Ur: NEGATIVE
Hyaline Casts, Ur: 0.3 /LPF (ref 0–8)
Ketones, Ur: NEGATIVE
Nitrite, Ur: NEGATIVE
RBC, Ur: 8 {cells}/[HPF] — ABNORMAL HIGH (ref 0–4)
Specific Gravity, Ur: 1.007 (ref 1.001–1.035)
Squamous Epithelial Cells, Ur: NEGATIVE /HPF
Urobilinogen, Ur: 0.2 EU
WBC, Ur: >100 {cells}/[HPF] — ABNORMAL HIGH (ref 0–5)
pH, Ur: 6.5

## 2023-05-28 LAB — IMMUNOFIXATION (IFE) AND PROTEIN ELECTROPHORESIS, RANDOM URINE
Albumin Ur: 72 %
Alpha-1-Glob Ur: 5.1 %
Alpha-2-Glob 24Ur: 4.9 %
Beta Glob Ur: 11.2 %
Gamma Glob Ur: 6.8 %
M-SPIKE % 24h Ur: 3.7 % — ABNORMAL HIGH
Protein Total Ur: 63.1 mg/dL

## 2023-05-28 LAB — CBC WITH DIFFERENTIAL
Basophils %: 0.5 %
Basophils Absolute: 0.06 10*3/uL (ref 0.00–0.22)
Eosinophils %: 0.5 %
Eosinophils Absolute: 0.06 10*3/uL (ref 0.00–0.50)
Hematocrit: 42.8 % (ref 37.0–53.0)
Hemoglobin: 13.7 g/dL (ref 13.0–17.5)
Immature Granulocytes %: 0.5 %
Immature Granulocytes Absolute: 0.06 10*3/uL (ref 0.00–0.10)
Lymphocyte %: 14.6 %
Lymphocytes Absolute: 1.76 10*3/uL (ref 0.70–4.00)
MCH: 30.6 pg (ref 26.0–34.0)
MCHC: 32 g/dL (ref 31.0–37.0)
MCV: 95.5 fL (ref 80.0–100.0)
MPV: 9.4 fL (ref 9.1–12.4)
Monocytes %: 4.8 %
Monocytes Absolute: 0.58 10*3/uL (ref 0.38–0.83)
NRBC %: 0 % (ref 0.0–0.0)
NRBC Absolute: 0 10*3/uL (ref 0.00–2.00)
Neutrophil %: 79.1 %
Neutrophils Absolute: 9.54 10*3/uL — ABNORMAL HIGH (ref 1.50–7.95)
Platelets: 248 10*3/uL (ref 150–400)
RBC: 4.48 M/uL (ref 4.20–5.90)
RDW-CV: 13.5 % (ref 11.5–14.5)
RDW-SD: 47.8 fL (ref 35.0–51.0)
WBC: 12.1 10*3/uL — ABNORMAL HIGH (ref 4.0–11.0)

## 2023-05-28 LAB — MAGNESIUM: Magnesium: 1.8 mg/dL (ref 1.6–2.6)

## 2023-05-28 LAB — POCT GLUCOSE: POCT Glucose: 185 mg/dL — ABNORMAL HIGH (ref 70–139)

## 2023-05-28 LAB — HEMOGLOBIN A1C: HEMOGLOBIN A1C % (INT/EXT): 6 % — ABNORMAL HIGH (ref <5.6)

## 2023-05-28 LAB — PHOSPHORUS: Phosphorus: 3.1 mg/dL (ref 2.4–4.9)

## 2023-05-28 MED ORDER — cefTRIAXone (Rocephin) 1 g in sodium chloride 0.9 % 100 mL IVPB-MBP
Freq: Once | INTRAVENOUS | Status: AC
Start: 2023-05-28 — End: 2023-05-28
  Administered 2023-05-28: 20:00:00 1 g via INTRAVENOUS

## 2023-05-28 MED ORDER — acetaminophen (Tylenol) tablet 650 mg
325 | Freq: Four times a day (QID) | ORAL | Status: DC | PRN
Start: 2023-05-28 — End: 2023-06-01

## 2023-05-28 MED ORDER — acetaminophen (Tylenol) suppository 650 mg
650 | Freq: Four times a day (QID) | RECTAL | Status: DC | PRN
Start: 2023-05-28 — End: 2023-06-01

## 2023-05-28 MED ORDER — OLANZapine (ZyPREXA) tablet 10 mg
5 | Freq: Every evening | ORAL | Status: DC
Start: 2023-05-28 — End: 2023-06-01
  Administered 2023-05-29 – 2023-06-01 (×4): 10 mg via ORAL

## 2023-05-28 MED ORDER — metoprolol succinate XL (Toprol-XL) 24 hr tablet 50 mg
50 | Freq: Every day | ORAL | Status: DC
Start: 2023-05-28 — End: 2023-06-01
  Administered 2023-05-29 – 2023-06-01 (×4): 50 mg via ORAL

## 2023-05-28 MED ORDER — atorvastatin (Lipitor) tablet 80 mg
80 | Freq: Every evening | ORAL | Status: DC
Start: 2023-05-28 — End: 2023-06-01
  Administered 2023-05-29 – 2023-06-01 (×4): 80 mg via ORAL

## 2023-05-28 MED ORDER — sodium zirconium cyclosilicate (Lokelma) packet 10 g
10 | Freq: Once | ORAL | Status: AC
Start: 2023-05-28 — End: 2023-05-28
  Administered 2023-05-28: 23:00:00 10 g via ORAL

## 2023-05-28 MED ORDER — bisacodyl (Dulcolax) EC tablet 10 mg
5 | Freq: Every day | ORAL | Status: DC | PRN
Start: 2023-05-28 — End: 2023-06-01

## 2023-05-28 MED ORDER — lisinopril tablet 10 mg
10 | Freq: Every day | ORAL | Status: DC
Start: 2023-05-28 — End: 2023-06-01

## 2023-05-28 MED ORDER — carBAMazepine (TEGretol) tablet 200 mg
200 | Freq: Two times a day (BID) | ORAL | Status: DC
Start: 2023-05-28 — End: 2023-06-01
  Administered 2023-05-29 – 2023-06-01 (×8): 200 mg via ORAL

## 2023-05-28 MED ORDER — lactulose 20 gram/30 mL oral solution 10 g
20 | Freq: Every day | ORAL | Status: DC
Start: 2023-05-28 — End: 2023-06-01
  Administered 2023-05-29 – 2023-06-01 (×4): 10 g via ORAL

## 2023-05-28 MED ORDER — tamsulosin (Flomax) 24 hr capsule 0.4 mg
0.4 | Freq: Every evening | ORAL | Status: DC
Start: 2023-05-28 — End: 2023-06-01
  Administered 2023-05-29 – 2023-06-01 (×4): 0.4 mg via ORAL

## 2023-05-28 MED ORDER — heparin (porcine) injection 5,000 Units
5000 | Freq: Three times a day (TID) | INTRAMUSCULAR | Status: DC
Start: 2023-05-28 — End: 2023-06-01
  Administered 2023-05-29 – 2023-05-31 (×6): 5000 [IU] via SUBCUTANEOUS

## 2023-05-28 MED ORDER — aspirin EC tablet 81 mg
81 | Freq: Every morning | ORAL | Status: DC
Start: 2023-05-28 — End: 2023-06-01
  Administered 2023-05-29 – 2023-06-01 (×4): 81 mg via ORAL

## 2023-05-28 MED ORDER — cefpodoxime (Vantin) tablet 200 mg
200 | Freq: Two times a day (BID) | ORAL | Status: DC
Start: 2023-05-28 — End: 2023-05-30
  Administered 2023-05-29 – 2023-05-30 (×3): 200 mg via ORAL

## 2023-05-28 MED ORDER — dextrose 50 % in water (D50W) solution 25 mL
INTRAVENOUS | Status: DC | PRN
Start: 2023-05-28 — End: 2023-06-01

## 2023-05-28 MED ORDER — dextrose 50 % in water (D50W) solution 50 mL
INTRAVENOUS | Status: DC | PRN
Start: 2023-05-28 — End: 2023-06-01

## 2023-05-28 MED ORDER — dextrose (D10W) 10 % bolus 125 mL
10 | INTRAVENOUS | Status: DC | PRN
Start: 2023-05-28 — End: 2023-06-01

## 2023-05-28 MED ORDER — acetaminophen (Tylenol) solution 650 mg
325 | Freq: Four times a day (QID) | ORAL | Status: DC | PRN
Start: 2023-05-28 — End: 2023-06-01

## 2023-05-28 MED ORDER — sennosides (Senokot) tablet 8.6 mg
8.6 | Freq: Every evening | ORAL | Status: DC
Start: 2023-05-28 — End: 2023-06-01
  Administered 2023-05-30 – 2023-06-01 (×3): 8.6 via ORAL

## 2023-05-28 MED ORDER — budesonide (Pulmicort) 0.25 mg/2 mL nebulizer solution 0.25 mg
0.25 | Freq: Two times a day (BID) | RESPIRATORY_TRACT | Status: DC
Start: 2023-05-28 — End: 2023-06-01
  Administered 2023-05-29 – 2023-06-01 (×7): 0.25 mg via RESPIRATORY_TRACT

## 2023-05-28 MED ORDER — sodium zirconium cyclosilicate (Lokelma) packet 10 g
10 | Freq: Every day | ORAL | Status: DC
Start: 2023-05-28 — End: 2023-06-01
  Administered 2023-05-29 – 2023-06-01 (×4): 10 g via ORAL

## 2023-05-28 MED ORDER — arformoterol (Brovana) nebulizer solution 15 mcg
15 | Freq: Two times a day (BID) | RESPIRATORY_TRACT | Status: DC
Start: 2023-05-28 — End: 2023-06-01
  Administered 2023-05-29 – 2023-06-01 (×7): 15 ug via RESPIRATORY_TRACT

## 2023-05-28 MED ORDER — sodium zirconium cyclosilicate (Lokelma) packet 10 g
10 | Freq: Once | ORAL | Status: AC
Start: 2023-05-28 — End: 2023-05-28
  Administered 2023-05-29: 03:00:00 10 g via ORAL

## 2023-05-28 MED ORDER — QUEtiapine (SEROquel) tablet 400 mg
100 | Freq: Every evening | ORAL | Status: DC
Start: 2023-05-28 — End: 2023-06-01
  Administered 2023-05-29 – 2023-06-01 (×4): 400 mg via ORAL

## 2023-05-28 MED ORDER — furosemide (Lasix) tablet 40 mg
40 | Freq: Every morning | ORAL | Status: DC
Start: 2023-05-28 — End: 2023-06-01
  Administered 2023-05-29 – 2023-06-01 (×4): 40 mg via ORAL

## 2023-05-28 MED ORDER — dextrose (D10W) 10 % bolus 250 mL
10 | INTRAVENOUS | Status: DC | PRN
Start: 2023-05-28 — End: 2023-06-01

## 2023-05-28 MED ORDER — revefenacin (Yupelri) nebulizer solution 175 mcg
175 | Freq: Every day | RESPIRATORY_TRACT | Status: DC
Start: 2023-05-28 — End: 2023-06-01
  Administered 2023-05-29 – 2023-06-01 (×4): 175 ug via RESPIRATORY_TRACT

## 2023-05-28 MED ORDER — polyethylene glycol (Glycolax) packet 17 g
17 | Freq: Every day | ORAL | Status: DC
Start: 2023-05-28 — End: 2023-06-01
  Administered 2023-05-29 – 2023-06-01 (×4): 17 g via ORAL

## 2023-05-28 MED ORDER — insulin lispro (HumaLOG) injection 0-5 Units
100 | Freq: Three times a day (TID) | SUBCUTANEOUS | Status: DC
Start: 2023-05-28 — End: 2023-06-01
  Administered 2023-05-28: 1 [IU] via SUBCUTANEOUS
  Administered 2023-05-30: 17:00:00 3 [IU] via SUBCUTANEOUS

## 2023-05-28 MED ORDER — glucagon injection 1 mg
1 | INTRAMUSCULAR | Status: DC | PRN
Start: 2023-05-28 — End: 2023-06-01

## 2023-05-28 MED ORDER — mirtazapine (Remeron) tablet 15 mg
15 | Freq: Every evening | ORAL | Status: DC
Start: 2023-05-28 — End: 2023-06-01
  Administered 2023-05-29 – 2023-06-01 (×4): 15 mg via ORAL

## 2023-05-28 MED FILL — LACTULOSE 20 GRAM/30 ML ORAL SOLUTION: 20 20 gram/30 mL | ORAL | Qty: 15

## 2023-05-28 MED FILL — INSULIN LISPRO (U-100) 100 UNIT/ML SUBCUTANEOUS SOLUTION: 100 100 unit/mL | SUBCUTANEOUS | Qty: 300

## 2023-05-28 MED FILL — SODIUM ZIRCONIUM CYCLOSILICATE 10 GRAM ORAL POWDER PACKET: 10 10 gram | ORAL | Qty: 10

## 2023-05-28 MED FILL — CEFTRIAXONE IVPB 1 G MINI-BAG PLUS IN 100 ML NS: 1.0000 1.0000 g | Qty: 1

## 2023-05-28 NOTE — ED Provider Notes (Signed)
EMERGENCY DEPARTMENT ENCOUNTER    ATTENDING NOTE      Date of Emergency Department Visit: 05/28/2023  I have performed a history and physical examination of the patient and discussed his management with the physician assistant / Resident.  I have reviewed the physician assistant / Resident note, documented findings and plan of care except as noted.    Date of service: 05/28/2023 11:46 AM    HISTORY OF PRESENT ILLNESS:    Arthur Young is a 58 y.o. male who  has a past medical history of Hypertension. BPH CKD CHF diabetes who presents with c/o urinary retention since yesterday unable to urinate / poor stream no feversor chills, no nausea or vomiting, no back or flank pain, no other complaints.        PHYSICAL EXAM   Vitals and nursing note reviewed.   General: Awake, alert, NAD  Head: Normocephalic, atraumatic  Neck : supple from no pain   ENT: Airway patent, no stridor  Pulm: No acute respiratory distress, no tachypnea; lungs CTAB no rales Nestor Ramp /wheezing   Cardiac: RRR no pulse deficits  Abdomen: soft, , nondistended; with mild suprapubic fullness and tenderness, no other tenderness to palpation, no rebd or guarding; nabs   Musculoskeletal: Back with full range of motion, no pain, no CVA tenderness.  moving all extremities equally; no calf swelling or tenderness; no C/C/E  Skin: No rashes or pallor; warm dry well perfused  Neuro: No focal deficit normal strength and sensation throughout, gait steady.  Moves all extremities.  No facial asymmetry.  Psych: Normal mood    LABS  Labs Reviewed   URINE CULTURE - Abnormal       Result Value    Urine Culture >100,000 CFU/mL Enterococcus faecalis (*)     Urine Culture 10,000-50,000 CFU/mL Mixed Bacterial flora      Narrative:     MIC (minimum inhibitory concentration) is specific to the antibiotic and the organism cultured. MICs should NOT be used to compare one antibiotic to another. In general, antibiotics reported as susceptible should be utilized first line.    URINALYSIS REFLEX TO CULTURE - Abnormal    Color, Ur Yellow      Clarity, Ur Clear      Specific Gravity, Ur 1.007      pH, Ur 6.5      Protein, Ur 2+ (*)     Glucose, Ur Negative      Ketones, Ur Negative      Blood, Ur Trace (*)     Bilirubin, Ur Negative      Urobilinogen, Ur 0.2      Nitrite, Ur Negative      Leukocyte Esterase, Ur 2+ (*)     WBC, Ur >100 (*)     RBC, Ur 8 (*)     Squamous Epithelial Cells, Ur Negative      Bacteria, Ur Few      Hyaline Casts, Ur 0.3     COMPREHENSIVE METABOLIC PANEL - Abnormal    Sodium 136      Potassium 5.9 (*)     Chloride 100      CO2 (Bicarbonate) 22      Anion Gap 14      BUN 23      Creatinine 2.06 (*)     eGFRcr 36 (*)     Glucose 120      Fasting? Unknown      Calcium 9.3      AST 19  ALT 9      Alkaline phosphatase 147 (*)     Protein, total 7.8      Albumin 4.6      Bilirubin, total 0.2     CBC WITH DIFFERENTIAL - Abnormal    WBC 12.1 (*)     RBC 4.48      Hemoglobin 13.7      Hematocrit 42.8      MCV 95.5      MCH 30.6      MCHC 32.0      RDW-CV 13.5      RDW-SD 47.8      Platelets 248      MPV 9.4      Neutrophil % 79.1      Lymphocyte % 14.6      Monocytes % 4.8      Eosinophils % 0.5      Basophils % 0.5      Immature Granulocytes % 0.5      NRBC % 0.0      Neutrophils Absolute 9.54 (*)     Lymphocytes Absolute 1.76      Monocytes Absolute 0.58      Eosinophils Absolute 0.06      Basophils Absolute 0.06      Immature Granulocytes Absolute 0.06      NRBC Absolute 0.00     HEMOGLOBIN A1C - Abnormal    HEMOGLOBIN A1C % (INT/EXT) 6.0 (*)    BASIC METABOLIC PANEL - Abnormal    Sodium 138      Potassium 5.0      Chloride 104      CO2 (Bicarbonate) 24      Anion Gap 10      BUN 20      Creatinine 1.99 (*)     eGFRcr 38 (*)     Glucose 150 (*)     Fasting? Unknown      Calcium 8.7     CBC WITH DIFFERENTIAL - Abnormal    WBC 7.4      RBC 4.02 (*)     Hemoglobin 12.4 (*)     Hematocrit 39.6      MCV 98.5      MCH 30.8      MCHC 31.3      RDW-CV 13.4      RDW-SD 49.0       Platelets 217      MPV 9.6      Neutrophil % 71.4      Lymphocyte % 19.0      Monocytes % 6.5      Eosinophils % 1.9      Basophils % 0.8      Immature Granulocytes % 0.4      NRBC % 0.0      Neutrophils Absolute 5.32      Lymphocytes Absolute 1.41      Monocytes Absolute 0.48      Eosinophils Absolute 0.14      Basophils Absolute 0.06      Immature Granulocytes Absolute 0.03      NRBC Absolute 0.00     BASIC METABOLIC PANEL - Abnormal    Sodium 140      Potassium 4.6      Chloride 106      CO2 (Bicarbonate) 24      Anion Gap 10      BUN 26 (*)     Creatinine 2.01 (*)     eGFRcr 38 (*)  Glucose 115      Fasting? Unknown      Calcium 8.6     CBC WITH DIFFERENTIAL - Abnormal    WBC 6.9      RBC 4.06 (*)     Hemoglobin 12.5 (*)     Hematocrit 39.6      MCV 97.5      MCH 30.8      MCHC 31.6      RDW-CV 13.5      RDW-SD 48.8      Platelets 215      MPV 9.6      Neutrophil % 61.9      Lymphocyte % 23.6      Monocytes % 10.6      Eosinophils % 2.5      Basophils % 1.0      Immature Granulocytes % 0.4      NRBC % 0.0      Neutrophils Absolute 4.24      Lymphocytes Absolute 1.62      Monocytes Absolute 0.73      Eosinophils Absolute 0.17      Basophils Absolute 0.07      Immature Granulocytes Absolute 0.03      NRBC Absolute 0.00     POCT GLUCOSE - Abnormal    POCT Glucose 185 (*)    POCT GLUCOSE - Abnormal    POCT Glucose 286 (*)    MAGNESIUM - Normal    Magnesium 1.8     PHOSPHORUS - Normal    Phosphorus 3.1     MAGNESIUM - Normal    Magnesium 2.1     PHOSPHORUS - Normal    Phosphorus 3.5     MAGNESIUM - Normal    Magnesium 2.4     PHOSPHORUS - Normal    Phosphorus 3.5     POCT GLUCOSE - Normal    POCT Glucose 94     POCT GLUCOSE - Normal    POCT Glucose 120     POCT GLUCOSE - Normal    POCT Glucose 96     POCT GLUCOSE - Normal    POCT Glucose 102     URINE TESTING (UA, UARC, URINE CULTURE)    Narrative:     The following orders were created for panel order Urine Testing (UA, UARC, Urine Culture).  Procedure                                Abnormality         Status                     ---------                               -----------         ------                     Urinalysis reflex to cul.Marland KitchenMarland Kitchen[[id number]]  Abnormal            Final result                 Please view results for these tests on the individual orders.   CBC W/DIFF    Narrative:     The following orders were created for panel order CBC and differential.  Procedure  Abnormality         Status                     ---------                               -----------         ------                     CBC w/ Differential[[id number]]          Abnormal            Final result                 Please view results for these tests on the individual orders.   CBC W/DIFF    Narrative:     The following orders were created for panel order CBC and differential.  Procedure                               Abnormality         Status                     ---------                               -----------         ------                     CBC w/ Differential[[id number]]          Abnormal            Final result                 Please view results for these tests on the individual orders.   CBC W/DIFF    Narrative:     The following orders were created for panel order CBC and differential.  Procedure                               Abnormality         Status                     ---------                               -----------         ------                     CBC w/ Differential[[id number]]          Abnormal            Final result                 Please view results for these tests on the individual orders.   POCT GLUCOSE METER   POCT GLUCOSE METER   POCT GLUCOSE METER   POCT GLUCOSE METER   POCT GLUCOSE METER        RADIOLOGY  No orders to display       MEDICAL DECISION MAKING    Differential Dx includes urinary retention, BPH, UTI, cystitis; AKI, CKD.  EXTERNAL RECORDS REVIEWED  Previous visits : Nephrology office visit 05/25/2023 reports history of CKD  stage III, GFR 45-49, hyperkalemia, chronic heart failure with reduced ejection fracture.    INDEPENDENT INTERPRETATIONS  EKG visualized and interpreted by me and shows: No STEMI     Cardiac Monitoring Rhythm Strip interpreted by me and shows:  Sinus Rhythm         TESTS/TREATMENT/HOSPITALIZATION CONSIDERED    Considered CT scan/abdominal imaging, patient without any flank or abdominal pain to suggest nephrolithiasis or other obstruction.    Glasgow Coma Scale Score: 15      CHRONIC CONDITIONS   has a past medical history of Hypertension.  BPH  CKD  CHF    ACUTE CONDITIONS  1. Acute urinary retention    2. Acute cystitis without hematuria    3. Hyperkalemia        DISCUSSION WITH OTHER PROVIDERS  Case management  Internal medicine      ED Course   ED Course as of 05/30/23 1605   Fri May 28, 2023   1252 WBC, Ur(!): >100         Diagnoses as of 05/30/23 1605   Acute urinary retention   Acute cystitis without hematuria   Hyperkalemia   Patient with urinary retention, bladder scan with more than 800 cc.  Foley catheter placed by nursing in the ED.  UA consistent with UTI.  Treated with IV antibiotics.  Patient lives in group home who stated they will only be unable to receive patient back with an indwelling Foley catheter.  Admitted for further management.    CLINICAL IMPRESSION:    1. Acute urinary retention    2. Acute cystitis without hematuria    3. Hyperkalemia        Lorea Kupfer A. Walker Kehr, MD  May 30, 2023, 4:05 PM  - - - - - - - - - - - - - - - - - - - - - - - - - - - - - - - - - - - - - - - - - - - - - - - - - - - - - - - - - - - - - - - -  Details of any procedures performed during this visit are documented separately in Procedure notes.  Portions of this electronic health record were generated with voice-recognition software.             Mayola Mcbain A. Walker Kehr, MD  05/30/23 [phone]

## 2023-05-28 NOTE — ED Triage Notes (Signed)
Pt arrives via EMS with difficulty urinating. Pt reports he has had difficulty today being able to pass urine. Denies pain.

## 2023-05-28 NOTE — H&P (Signed)
[facility] Admission History & Physical Note    PATIENT: Arthur Young  MRN: [id number]   DATE OF ADMISSION: 05/28/2023  TIME: 4:34 PM  PCP: Etter Sjogren, MD    CHIEF COMPLAINT:     Chief Complaint   Patient presents with    Urinary Retention        HISTORY OF PRESENT ILLNESS:   Arthur Young is a 58 y.o. male with a past medical history of BPH, CKD, HFrEF, HTN, pHTN, COPD, T2DM, seizure disorder, schizoaffective disorder who presented to [facility] with UTI and acute urinary retention requiring foley catheterization.    Patient was unable to urinate/ poor stream since this morning. He endorsed dysuria, urgency and frequency. According to the patient he had urinary retention that required a foley be placed once before. He also endorses constipation. He denies fever, chills, sick contacts, chest pain, SOB, N/V/D.    ED Course:   In the ED Foley catheter placed and he received a dose of IV ceftriaxone for UTI.  BMP with hyperkalemia (5.9). On 1/14 his potassium was 5.8. He takes lokelma at home.   CBC with leukocytosis (12.1)  UA with >100 WBC and reflexed to culture.    Initial Vitals: 37.3 C (99.2 F)  HR 89  BP 103/65  RR 16   SpO2 (!) 93 % on room air.  Weight 63.5 kg     Review of Systems:   Pertinent positive and negative as per HPI, otherwise a 10-point review of systems was negative.     PAST MEDICAL HISTORY:     Patient Active Problem List   Diagnosis    AKI (acute kidney injury)    Benign prostatic hyperplasia with urinary obstruction    Bronchiectasis without complication (Multi-HCC)    Chronic bronchitis (Multi-HCC)    Chronic constipation    Chronic HFrEF (heart failure with reduced ejection fraction) (Multi-HCC)    Chronic venous stasis dermatitis of both lower extremities    Cigarette nicotine dependence without complication    CKD stage G3a/A3, GFR 45-59 and albumin creatinine ratio >300 mg/g (Multi-HCC)    Dysarthria    Enlarged prostate    Gammopathy, monoclonal    Hyperkalemia    Hypermetropia of  both eyes    Hypertriglyceridemia    Macrocytosis without anemia    Male erectile disorder    Multiple lung nodules on CT    Paranoid schizophrenia (Multi-HCC)    Polycythemia, secondary    Essential (primary) hypertension    Primary open-angle glaucoma    Pulmonary hypertension (Multi-HCC)    Rectal adenocarcinoma (Multi-HCC)    Schizoaffective disorder (Multi-HCC)    Severe chronic obstructive pulmonary disease (Multi-HCC)    Type 2 diabetes mellitus with diabetic nephropathy, without long-term current use of insulin (Multi-HCC)    Urinary retention    Vitamin D deficiency    Seizure disorder (Multi-HCC)    Acute urinary retention       PAST SURGICAL HISTORY:   History reviewed. No pertinent surgical history.     FAMILY HISTORY:   No family history on file.    SOCIAL HISTORY:     Social History     Tobacco Use    Smoking status: Every Day     Current packs/day: 1.00     Average packs/day: 1 pack/day for 35.0 years (35.0 ttl pk-yrs)     Types: Cigarettes    Smokeless tobacco: Never   Substance Use Topics    Alcohol use: Never  Drug use: Never      Lives in [facility]     PHYSICAL EXAM ON ADMISSION:   Vitals:   Temp:  [37.3 C (99.2 F)] 37.3 C (99.2 F)  Pulse:  [89] 89  Resp:  [16] 16  SpO2:  [93 %] 93 %  BP: (103)/(65) 103/65   SpO2 (!) 93 % on room air.     Physical Examination:  General: Patient well-appearing lying in bed. NAD  Neuro: A&Ox3. No focal neuro deficits  HEENT: MMM. Sclera anicteric and uninjected  Chest/Lungs: CTAB. No increased WOB. No wheezes, crackles, ronchi.   CV: RRR, normal S1/S2, no murmurs  Abd: Soft, NT, ND, +bowel sounds. No suprapubic pain. No CVA tenderness  Ext: Warm, well-perfused. No LE edema  Skin: No rashes over exposed areas  Psych: Euthymic with normal affect    LAB RESULTS:   Labs reviewed in the chart, notable for:  Lab Results   Component Value Date    NA 136 05/28/2023    K 5.9 (H) 05/28/2023    CL 100 05/28/2023    CO2 22 05/28/2023    BUN 23 05/28/2023     CREATININE 2.06 (H) 05/28/2023     Lab Results   Component Value Date    WBC 12.1 (H) 05/28/2023    HGB 13.7 05/28/2023    HCT 42.8 05/28/2023    MCV 95.5 05/28/2023    PLT 248 05/28/2023     Lab Results   Component Value Date    ALT 9 05/28/2023    AST 19 05/28/2023    BILITOT 0.2 05/28/2023    PROT 7.8 05/28/2023    ALBUMIN 4.6 05/28/2023     No results found for: "PT", "PTT", "INR"    Micro reviewed in the chart, notable for:   Blood Cultures: No results found for: "BLOODCX"   Urine Cultures: No results found for: "URINECX"     IMAGING/RADIOLOGY/CARDIAC TESTING:   Imaging reviewed in the chart, notable for:  N/A    EKG:        Last TTE:    No echocardiogram results found for the past 12 months     HOME MEDICATIONS:   List confirmed with: patient or family member.  Current Outpatient Medications   Medication Instructions    albuterol 90 mcg/actuation inhaler 2 puffs, inhalation, Every 6 hours PRN    ammonium lactate (Lac-Hydrin) 12 % lotion SMARTSIG:Application Topical    aspirin 81 mg, oral, Every morning    atorvastatin (LIPITOR) 80 mg, oral, Nightly    benzonatate (TESSALON) 200 mg, oral, Every 8 hours PRN    bisacodyl (DULCOLAX) 10 mg, oral, Daily PRN    brimonidine (AlphaGAN P) 0.15 % ophthalmic solution 1 drop, ophthalmic (eye), 3 times daily    carBAMazepine (TEGRETOL) 200 mg, oral, 2 times daily    diclofenac (Voltaren) 1 % topical gel topical (top)    dorzolamide (Trusopt) 2 % ophthalmic solution 1 drop, ophthalmic (eye), 2 times daily    Enulose 10 gram/15 mL solution SMARTSIG:15 Milliliter(s) By Mouth Every Morning    fluticasone-umeclidin-vilanter (Trelegy Ellipta) 100-62.5-25 mcg blister with device 1 Inhalation, inhalation, Daily RT    furosemide (LASIX) 40 mg, oral, Every morning    hydrocortisone 2.5 % cream SMARTSIG:Topical    lisinopril 10 mg, oral, Daily    metFORMIN XR (GLUCOPHAGE-XR) 500 mg, oral, 2 times daily    metoprolol succinate XL (Toprol-XL) 50 mg 24 hr tablet TAKE ONE TABLET BY MOUTH  ONCE DAILY    mirtazapine (REMERON) 15 mg, oral, Nightly    OLANZapine (ZYPREXA) 10 mg, oral, Nightly    QUEtiapine (SEROQUEL) 400 mg, oral    senna 8.6 mg tablet 2 tablets, oral, 2 times daily    sodium zirconium cyclosilicate (Lokelma) 5 gram packet TAKE 5 GRAMS BY MOUTH EVERY OTHER DAY    Stool Softener 100 mg, oral, 2 times daily    tamsulosin (FLOMAX) 0.4 mg, oral, Nightly        ASSESSMENT & PLAN:   Arthur Young is a 57 y.o. male with a past medical history of BPH, CKD, HFrEF, HTN, pHTN, COPD, T2DM, seizure disorder, schizoaffective disorder who presented to [facility] with UTI and acute urinary retention requiring foley catheterization.      #Urinary retention  #BPH  #UTI  Patient was unable to urinate/ poor stream since the morning of admission. He endorsed dysuria, urgency and frequency. According to the patient he had urinary retention that required a foley be placed once before. Foley was placed in the ED and draining yellow urine. CBC with leukocytosis (12.1). He received a dose of ceftriaxone in the ED. Starting cefpodoxime.   - Foley in place, voiding trial tomorrow  - F/u outpatient with urology  - Cefpodoxime 200mg  daily  - C/w home tamsulosin 0.4mg  nightly    #Hyperkalemia  #CKD Stage 3b  Follows with nephrology at [facility]. CKD likely due to T2DM, HTN and cardiorenal syndrome. Baseline Cr ranging from 1.9 to 2.2. Baseline GFR is 39 and day of presentation GFR is 36. Patient also with hyperkalemia and takes lokelma at home.   - Lokelma 10g BID today  - Lokelma 10g daily starting tomorrow  - Holding home lisinopril    CHRONIC PROBLEMS:  #COPD, Gold stage 4/D not on home O2  #Lung nodule (1.9cm in LLL)  #Pulmonary HTN (WHO group 2/3)  - C/w Trelegy equivalents     #HFrEF (20-25%)  - C/w aspirin 81mg  daily  - C/w atorvastatin 80mg  nightly   - C/w lasix 40mg  daily  - C/w metoprolol XL 50mg  daily    #Schizoaffective disorder  - C/w home carbamazepine 200mg  BID  - C/w home mirtazapine 15mg  night  - C/w home zyprexa  10mg  nightly  - C/w seroquel 400mg  nightly    #T2DM  Last A1c 6.4 in 11/2022  - SSI    #Constipation  - miralax, senna, lactulose    HOME MEDS HELD DURING ADMISSION:  Trelegy, metformin    Hospital Bundle   Code Status: Full Code  Diet: Adult diet Renal; Pre-dialysis; No added salt  DVT Prophylaxis: On SC heparin  Contact: @PRIMEMERCNT (<SUBSCRIPT> error)@  @[email or web address] (<SUBSCRIPT> error)@   Access:   Patient Lines/Drains/Airways Status       Active .       Name Placement date Placement time Site Days    Peripheral IV 05/28/23 Anterior;Left Forearm 05/28/23  1342  Forearm  less than 1    Urethral Catheter 14 Fr. 05/28/23  1440  -- less than 1                  _____________________________  Emeline Gins, MD  PGY 1, Internal Medicine  [facility]

## 2023-05-28 NOTE — Unmapped (Signed)
Patient: Arthur Young   MRN: [id number]   Date of service: 05/28/2023   PCP: Etter Sjogren, MD     EMERGENCY DEPARTMENT ENCOUNTER    CHIEF COMPLAINT       Chief Complaint   Patient presents with    Urinary Retention       HPI     Arthur Young is a 58 y.o. male with past medical history of BPH, CKD, HFrEF, HTN, pHTN, T2DM, seizure disorder who presents to the ED via EMS for evaluation of difficulty urinating. The patient reports that he has had difficulty urinating today, he has to force the stream of urine and has been unable to void completely since yesterday. Denies fever, vomiting, or flank pain.     REVIEW OF SYSTEMS       As per HPI.     PAST MEDICAL HISTORY         Past Medical History:   Diagnosis Date    Hypertension        CURRENT MEDICATIONS       Previous Medications    ALBUTEROL 90 MCG/ACTUATION INHALER    Inhale 2 puffs every 6 (six) hours if needed for wheezing.    AMMONIUM LACTATE (LAC-HYDRIN) 12 % LOTION    SMARTSIG:Application Topical    ASPIRIN 81 MG EC TABLET    Take 81 mg by mouth in the morning.    ATORVASTATIN (LIPITOR) 80 MG TABLET    Take 80 mg by mouth at bedtime.    BENZONATATE (TESSALON) 100 MG CAPSULE    Take 200 mg by mouth every 8 (eight) hours if needed.    BISACODYL (DULCOLAX) 5 MG EC TABLET    Take 10 mg by mouth if needed each day.    BRIMONIDINE (ALPHAGAN P) 0.15 % OPHTHALMIC SOLUTION    Administer 1 drop into affected eye(s) three times daily.    CARBAMAZEPINE (TEGRETOL) 200 MG TABLET    Take 200 mg by mouth twice daily.    DICLOFENAC (VOLTAREN) 1 % TOPICAL GEL    Apply topically.    DORZOLAMIDE (TRUSOPT) 2 % OPHTHALMIC SOLUTION    Administer 1 drop into affected eye(s) twice daily.    ENULOSE 10 GRAM/15 ML SOLUTION    SMARTSIG:15 Milliliter(s) By Mouth Every Morning    FLUTICASONE-UMECLIDIN-VILANTER (TRELEGY ELLIPTA) 100-62.5-25 MCG BLISTER WITH DEVICE    Inhale 1 Inhalation in the morning.    FUROSEMIDE (LASIX) 40 MG TABLET    Take 40 mg by mouth in the morning.    HYDROCORTISONE  2.5 % CREAM    SMARTSIG:Topical    LISINOPRIL 10 MG TABLET    Take 1 tablet (10 mg) by mouth once daily.    METFORMIN XR (GLUCOPHAGE-XR) 500 MG 24 HR TABLET    Take 500 mg by mouth twice daily.    METOPROLOL SUCCINATE XL (TOPROL-XL) 50 MG 24 HR TABLET    TAKE ONE TABLET BY MOUTH ONCE DAILY    MIRTAZAPINE (REMERON) 15 MG TABLET    Take 15 mg by mouth at bedtime.    OLANZAPINE (ZYPREXA) 10 MG TABLET    Take 10 mg by mouth at bedtime.    QUETIAPINE (SEROQUEL) 400 MG TABLET    Take 400 mg by mouth.    SENNA 8.6 MG TABLET    Take 2 tablets by mouth twice daily.    SODIUM ZIRCONIUM CYCLOSILICATE (LOKELMA) 5 GRAM PACKET    TAKE 5 GRAMS BY MOUTH EVERY OTHER DAY    STOOL SOFTENER 100  MG CAPSULE    Take 100 mg by mouth twice daily.    TAMSULOSIN (FLOMAX) 0.4 MG 24 HR CAPSULE    Take 0.4 mg by mouth at bedtime.         FAMILY HISTORY       No family history on file.    SURGICAL HISTORY       History reviewed. No pertinent surgical history.    ALLERGIES         Allergies   Allergen Reactions    Divalproex      Other reaction(s): 40 lb wt gain  DEPAKOTE 40 lb wt gain--    Lithium        SOCIAL HISTORY        Social History     Socioeconomic History    Marital status: Single     Spouse name: Not on file    Number of children: Not on file    Years of education: Not on file    Highest education level: Not on file   Occupational History    Not on file   Tobacco Use    Smoking status: Every Day     Current packs/day: 1.00     Average packs/day: 1 pack/day for 35.0 years (35.0 ttl pk-yrs)     Types: Cigarettes    Smokeless tobacco: Never   Substance and Sexual Activity    Alcohol use: Never    Drug use: Never    Sexual activity: Not on file   Other Topics Concern    Not on file   Social History Narrative    Not on file     Social Determinants of Health     Financial Resource Strain: Not on File (07/28/2021)    Received from [employer], [employer]     Financial Resource Strain: 0   Food Insecurity: Not on File  (07/28/2021)    Received from [place], [place], [employer]    Food Insecurity     Food: 0   Transportation Needs: Not on File (07/28/2021)    Received from [employer], [employer] [facility]     Transportation: 0   Recent Concern: Transportation Needs - At Risk (07/28/2021)    Received from [employer] [facility]     Transportation: 2   Physical Activity: Not on File (01/02/2018)    Received from [place], [place]    Physical Activity     Physical Activity: 0   Stress: Not on File (01/02/2018)    Received from [facility], [place]    Stress     Stress: 0   Social Connections: Not on File (01/16/2023)    Received from [place], [place]    Social Connections     Connectedness: 0   Intimate Partner Violence: Not on file   Housing Stability: Not on File (07/28/2021)    Received from [place], [facility]    Housing Stability     Housing: 0       PHYSICAL EXAM        VITAL SIGNS:    ED Triage Vitals [05/28/23 1144]   Temp Pulse Resp BP   37.3 C (99.2 F) 89 16 103/65      SpO2 Temp Source Heart Rate Source Patient Position   (!) 93 % Oral -- --      BP Location FiO2 (%)     -- --          General: The patient  appears awake, alert, non-toxic, and in no acute distress.  Head: Normocephalic, atraumatic.   ENT: Moist mucus membranes.   Neck: Supple, trachea midline.   Respiratory: Even, unlabored respirations. Lungs CTAB.   CVS: Regular rate, Regular rhythm, +S1/S2, no murmur.   GI: Soft. Mild distention with suprapubic tenderness.   GU: No CVAT.   Neuro: GCS15, steady gait.     RESULTS        LABS  Labs Reviewed   URINALYSIS REFLEX TO CULTURE - Abnormal       Result Value    Color, Ur Yellow      Clarity, Ur Clear      Specific Gravity, Ur 1.007      pH, Ur 6.5      Protein, Ur 2+ (*)     Glucose, Ur Negative      Ketones, Ur Negative      Blood, Ur Trace (*)     Bilirubin, Ur Negative      Urobilinogen, Ur 0.2      Nitrite, Ur Negative      Leukocyte Esterase, Ur 2+ (*)     WBC, Ur >100 (*)     RBC, Ur 8 (*)     Squamous Epithelial Cells, Ur Negative       Bacteria, Ur Few      Hyaline Casts, Ur 0.3     COMPREHENSIVE METABOLIC PANEL - Abnormal    Sodium 136      Potassium 5.9 (*)     Chloride 100      CO2 (Bicarbonate) 22      Anion Gap 14      BUN 23      Creatinine 2.06 (*)     eGFRcr 36 (*)     Glucose 120      Fasting? Unknown      Calcium 9.3      AST 19      ALT 9      Alkaline phosphatase 147 (*)     Protein, total 7.8      Albumin 4.6      Bilirubin, total 0.2     CBC WITH DIFFERENTIAL - Abnormal    WBC 12.1 (*)     RBC 4.48      Hemoglobin 13.7      Hematocrit 42.8      MCV 95.5      MCH 30.6      MCHC 32.0      RDW-CV 13.5      RDW-SD 47.8      Platelets 248      MPV 9.4      Neutrophil % 79.1      Lymphocyte % 14.6      Monocytes % 4.8      Eosinophils % 0.5      Basophils % 0.5      Immature Granulocytes % 0.5      NRBC % 0.0      Neutrophils Absolute 9.54 (*)     Lymphocytes Absolute 1.76      Monocytes Absolute 0.58      Eosinophils Absolute 0.06      Basophils Absolute 0.06      Immature Granulocytes Absolute 0.06      NRBC Absolute 0.00     URINE CULTURE   URINE TESTING (UA, UARC, URINE CULTURE)    Narrative:     The following orders were created for panel order Urine Testing (UA, UARC, Urine Culture).  Procedure                               Abnormality         Status                     ---------                               -----------         ------                     Urinalysis reflex to cul.Marland KitchenMarland Kitchen[[id number]]  Abnormal            Final result                 Please view results for these tests on the individual orders.   CBC W/DIFF    Narrative:     The following orders were created for panel order CBC and differential.  Procedure                               Abnormality         Status                     ---------                               -----------         ------                     CBC w/ Differential[[id number]]          Abnormal            Final result                 Please view results for these tests on the individual orders.        ED  COURSE & MEDICAL DECISION MAKING        Pertinent Lab studies reviewed. Patient's medical record reviewed.     This patient is a 58 y.o. male with PMH BPH, CKD, HFrEF, HTN, pHTN, T2DM, seizure disorder, schizophrenia who presents to the ED via EMS for evaluation of difficulty urinating x 24 hours. Presents with lower abdominal distention and suprapubic tenderness with PVR of 800 cc. Differential includes acute urinary retention, UTI, AKI.     ED Course as of 05/28/23 1454   Fri May 28, 2023   1420 Auto WBC(!): 12.1   1420 Hemoglobin: 13.7   1427 Potassium(!): 5.9   1427 Creatinine(!): 2.06  Improved from 3 days ago   1441 ECG 12 lead  Afib, rate 81, QTc 477         Diagnoses as of 05/28/23 1454   Acute urinary retention   Acute cystitis without hematuria   Hyperkalemia     Foley catheter placed by RN. Labs notable for mild leukocytosis (12.1) and UA is consistent with infection. Also of note, despite improved creatinine from 3 days ago, K is elevated at 5.9, compared to 5.8 x 3 days ago and despite outpatient treatment with lokelma. EKG is reassuring with no concerning changes and QTc WNL. The patient was given a single dose of  IV ceftriaxone for UTI.     I did speak with renal, Dr Angelia Mould, who recommended continued lokelma 10g BID today followed by 5g daily x 1 week or until repeat labs on 1/20-1/21 (patient had a dose this morning). After speaking with a representative from the patient's group home, [facility], the facility is not able to accept the patient back with a foley catheter in place. The patient will have to be admitted until he can pass void trial. He is amenable to this plan.     CLINICAL IMPRESSION  1. Acute urinary retention    2. Acute cystitis without hematuria    3. Hyperkalemia         Final Disposition:   Admit  _______________________________________________________________________________________________    Arther Dames, PA        Arther Dames, [place]  05/28/23 [phone]

## 2023-05-28 NOTE — ED Notes (Signed)
Pt able to void small amount of urine. Pt reports "pressure" with urination - denies any pain or hematuria. Pt reports difficulty initiating stream. Pt bladder scanned for >825mL of urine post void. PA made aware, plan for blood work. Pt is A&Ox4, GCS 15, cooperative with care. Will continue to monitor.    Past Medical History:   Diagnosis Date    Hypertension           Lorne Skeens, RN  05/28/23 1234

## 2023-05-28 NOTE — Progress Notes (Signed)
Case Management Progress Note:   Pt lives in [facility]  [address] [place], [place]  House Manager Rockne Menghini [phone]   Rockne Menghini deferred to Director of Nursing Marlise Eves  [phone] re discharge plan.Solasde notified that pt being admitted  over night for Observation with Hyperkalemia. Discussed pt will be discharged home with indwelling foley catheter. 05/29/2023. Solasde reports no staff on weekend and holiday to accept pt back. Therefore pt will D/C 06/01/2023. She also reports pt will have to be independent with foley catheter care.Discussed possibility of VNA SN for reinforcement of Foley Cath teaching. She agrees with this plan.

## 2023-05-29 LAB — BASIC METABOLIC PANEL
Anion Gap: 10 mmol/L (ref 3–14)
BUN: 20 mg/dL (ref 6–24)
CO2 (Bicarbonate): 24 mmol/L (ref 20–32)
Calcium: 8.7 mg/dL (ref 8.5–10.5)
Chloride: 104 mmol/L (ref 98–110)
Creatinine: 1.99 mg/dL — ABNORMAL HIGH (ref 0.55–1.30)
Glucose: 150 mg/dL — ABNORMAL HIGH (ref 70–139)
Potassium: 5 mmol/L (ref 3.6–5.2)
Sodium: 138 mmol/L (ref 135–146)
eGFRcr: 38 mL/min/{1.73_m2} — ABNORMAL LOW (ref >=60)

## 2023-05-29 LAB — CBC WITH DIFFERENTIAL
Basophils %: 0.8 %
Basophils Absolute: 0.06 10*3/uL (ref 0.00–0.22)
Eosinophils %: 1.9 %
Eosinophils Absolute: 0.14 10*3/uL (ref 0.00–0.50)
Hematocrit: 39.6 % (ref 37.0–53.0)
Hemoglobin: 12.4 g/dL — ABNORMAL LOW (ref 13.0–17.5)
Immature Granulocytes %: 0.4 %
Immature Granulocytes Absolute: 0.03 10*3/uL (ref 0.00–0.10)
Lymphocyte %: 19 %
Lymphocytes Absolute: 1.41 10*3/uL (ref 0.70–4.00)
MCH: 30.8 pg (ref 26.0–34.0)
MCHC: 31.3 g/dL (ref 31.0–37.0)
MCV: 98.5 fL (ref 80.0–100.0)
MPV: 9.6 fL (ref 9.1–12.4)
Monocytes %: 6.5 %
Monocytes Absolute: 0.48 10*3/uL (ref 0.38–0.83)
NRBC %: 0 % (ref 0.0–0.0)
NRBC Absolute: 0 10*3/uL (ref 0.00–2.00)
Neutrophil %: 71.4 %
Neutrophils Absolute: 5.32 10*3/uL (ref 1.50–7.95)
Platelets: 217 10*3/uL (ref 150–400)
RBC: 4.02 M/uL — ABNORMAL LOW (ref 4.20–5.90)
RDW-CV: 13.4 % (ref 11.5–14.5)
RDW-SD: 49 fL (ref 35.0–51.0)
WBC: 7.4 10*3/uL (ref 4.0–11.0)

## 2023-05-29 LAB — MAGNESIUM: Magnesium: 2.1 mg/dL (ref 1.6–2.6)

## 2023-05-29 LAB — POCT GLUCOSE
POCT Glucose: 94 mg/dL (ref 70–139)
POCT Glucose: 120 mg/dL (ref 70–139)
POCT Glucose: 96 mg/dL (ref 70–139)

## 2023-05-29 LAB — PHOSPHORUS: Phosphorus: 3.5 mg/dL (ref 2.4–4.9)

## 2023-05-29 MED FILL — SODIUM ZIRCONIUM CYCLOSILICATE 10 GRAM ORAL POWDER PACKET: 10 10 gram | ORAL | Qty: 10

## 2023-05-29 MED FILL — CEFPODOXIME 200 MG TABLET: 200 200 mg | ORAL | Qty: 1

## 2023-05-29 MED FILL — ARFORMOTEROL 15 MCG/2 ML SOLUTION FOR NEBULIZATION: 15 15 mcg/2 mL | RESPIRATORY_TRACT | Qty: 2

## 2023-05-29 MED FILL — POLYETHYLENE GLYCOL 3350 17 GRAM ORAL POWDER PACKET: 17 17 gram | ORAL | Qty: 1

## 2023-05-29 MED FILL — METOPROLOL SUCCINATE ER 50 MG TABLET,EXTENDED RELEASE 24 HR: 50 50 mg | ORAL | Qty: 1

## 2023-05-29 MED FILL — SENNOSIDES 8.6 MG TABLET: 8.6 8.6 mg | ORAL | Qty: 1

## 2023-05-29 MED FILL — ASPIRIN 81 MG TABLET,DELAYED RELEASE: 81 81 mg | ORAL | Qty: 1

## 2023-05-29 MED FILL — HEPARIN (PORCINE) 5,000 UNIT/ML INJECTION SOLUTION: 5000 5,000 unit/mL | INTRAMUSCULAR | Qty: 1

## 2023-05-29 MED FILL — REVEFENACIN 175 MCG/3 ML SOLUTION FOR NEBULIZATION: 175 175 mcg/3 mL | RESPIRATORY_TRACT | Qty: 3

## 2023-05-29 MED FILL — MIRTAZAPINE 15 MG TABLET: 15 15 mg | ORAL | Qty: 1

## 2023-05-29 MED FILL — BUDESONIDE 0.25 MG/2 ML SUSPENSION FOR NEBULIZATION: 0.25 0.25 mg/2 mL | RESPIRATORY_TRACT | Qty: 2

## 2023-05-29 MED FILL — CARBAMAZEPINE 200 MG TABLET: 200 200 mg | ORAL | Qty: 1

## 2023-05-29 MED FILL — LACTULOSE 20 GRAM/30 ML ORAL SOLUTION: 20 20 gram/30 mL | ORAL | Qty: 30

## 2023-05-29 MED FILL — ATORVASTATIN 80 MG TABLET: 80 80 mg | ORAL | Qty: 1

## 2023-05-29 MED FILL — TAMSULOSIN 0.4 MG CAPSULE: 0.4 0.4 mg | ORAL | Qty: 1

## 2023-05-29 MED FILL — FUROSEMIDE 40 MG TABLET: 40 40 mg | ORAL | Qty: 1

## 2023-05-29 MED FILL — QUETIAPINE 100 MG TABLET: 100 100 mg | ORAL | Qty: 4

## 2023-05-29 MED FILL — OLANZAPINE 5 MG TABLET: 5 5 mg | ORAL | Qty: 2

## 2023-05-29 NOTE — Progress Notes (Signed)
Case Management Initial Assessment     Name: Arthur Young  MRN: [id number]    PMH: BPH, CKD, HFrEF, HTN, pHTN, COPD, T2DM, seizure disorder, schizoaffective disorder     Admitted with UTI and acute urinary retention requiring foley catheterization.     Voiding trail DTV 05/29/23 at 5-7 PM    If discharging home with foley, patient will need to be independent with foley and leg bag.Will need a VNA    Patient may not be able to discharge until 06/01/23 due to holiday and staff not available      Case Management Initial Assessment      Flowsheet Row Most Recent Value   Case Management Initial Assessment    Patient Discharged Before Able to Interview/Assess No   Source of Information Family  [Medical record]   Type of Residence Group Home   Lives With Alone, Other (comment)  [facility] Rockne Menghini [place] [phone]]   Discharge Services Anticipated at this Time Yes   Expected Discharge Needs VNA          Patient Information      Flowsheet Row Most Recent Value   Patient Information    Interpreter Needs None   Home Caregiver --  [Group Home Staff]   Accompanied by None   Support Systems Other (comment)  [[facility]]   Need PCP on Discharge No  Rossie Muskrat MD]   Current Patient Responsibilities Personal Care          PTA Functional Status      Flowsheet Row Most Recent Value   PTA Functional Status    Stairs to [employer] 3   Stairs Inside Home 14   Is patient prescribed an anti-coagulant? No          Current Functional Status      Flowsheet Row Most Recent Value   Current Functional Status    Bathing Independent   Toileting Independent   Walking in Home Independent   Assistive Device Used None   Community Mobility Minimal Assist   IADL Assistance Needed Yes          Financial Information      Flowsheet Row Most Recent Value   Financial Information    Insurance Confirmed with Patient Yes   Bed Hold N/A   Financial Concerns in Last 60 Days Admit/Readmission No   [employer] Publishing rights manager  Notified No          Training and development officer Most Recent Value   Legal Information    Does Patient Have HCP Yes  [Daughter Hulda Humphrey [phone]]   Patient Requests Assistance for HCP No   Social Services Notified to Complete/Assist No   Foster Parent/Guardian No   Foster Parent/Guardian Paperwork Available No   Foster Parent/Guardian/SW Notified No          Discharge Hydrologist Most Recent Value   Discharge Planning    Patient/Family/Caregiver Discharge Preference Home w/ Services   Anticipated Discharge Disposition Home w/ VNA          Next Actions      Flowsheet Row Most Recent Value   Next Actions    Assessment/High Risk Screening Complete

## 2023-05-29 NOTE — Care Plan (Incomplete)
 Goal Outcome Evaluation:

## 2023-05-29 NOTE — Nursing Note (Addendum)
This Rn resumed care at 1500.  Previous nurse removed pt's Foley this morning. Pt urinated 100 ml with 50 ml residual. Pt a&OX 3.  VSS, afebrile.  No concerns, call bell in reach. Will continue with plan of care.

## 2023-05-29 NOTE — Progress Notes (Signed)
[facility] Daily Progress Note      Date: May 29, 2023  Time: 10:53 AM  Attending: Janae Sauce, MD  Hospital Day: 0    Subjective      Interval events:   No acute events overnight.    Subjective:   Patient overall feeling well. No fever, chills, abdominal pain, or other complaints.          Objective      Vital Signs:  Temp:  [36.8 C (98.3 F)-37.3 C (99.2 F)] 36.8 C (98.3 F)  Pulse:  [83-93] 93  Resp:  [16-24] 21  SpO2:  [93 %-97 %] 96 %  BP: (101-127)/(55-71) 103/65   SpO2 96 % on room air.     Ins/Outs:    Intake/Output Summary (Last 24 hours) at 05/29/2023 1053  Last data filed at 05/29/2023 0030  Gross per 24 hour   Intake 460 ml   Output 2425 ml   Net -1965 ml        Weight:   Last 3 Weights         05/28/2023  1144             Weight: 63.5 kg             Physical Exam  Vitals reviewed.   Constitutional:       Appearance: Normal appearance.   HENT:      Head: Normocephalic and atraumatic.   Eyes:      General: No scleral icterus.     Extraocular Movements: Extraocular movements intact.   Cardiovascular:      Rate and Rhythm: Normal rate and regular rhythm.      Heart sounds: No murmur heard.  Pulmonary:      Effort: No respiratory distress.      Breath sounds: Normal breath sounds.   Abdominal:      General: There is no distension.      Palpations: Abdomen is soft.      Tenderness: There is no abdominal tenderness.   Genitourinary:     Comments: Foley in place with yellow urine.  Musculoskeletal:      Right lower leg: No edema.      Left lower leg: No edema.   Skin:     General: Skin is warm and dry.   Neurological:      Mental Status: He is alert.          Data/Results:  Labs, microbiology, and imaging reviewed on team rounds.    Medications:  revefenacin, 175 mcg, nebulization, Daily   And  arformoterol, 15 mcg, nebulization, BID   And  budesonide, 0.25 mg, nebulization, BID  aspirin, 81 mg, oral, q AM  atorvastatin, 80 mg, oral, Nightly  carBAMazepine, 200 mg, oral, BID  cefpodoxime, 200 mg,  oral, BID  furosemide, 40 mg, oral, q AM  heparin (porcine), 5,000 Units, subcutaneous, q8h [facility]  insulin lispro, 0-5 Units, subcutaneous, TID AC  lactulose, 10 g, oral, Daily  [Held by provider] lisinopril, 10 mg, oral, Daily  metoprolol succinate XL, 50 mg, oral, Daily  mirtazapine, 15 mg, oral, Nightly  OLANZapine, 10 mg, oral, Nightly  polyethylene glycol, 17 g, oral, Daily  QUEtiapine, 400 mg, oral, Nightly  sennosides, 1 tablet, oral, Nightly  sodium zirconium cyclosilicate, 10 g, oral, Daily  tamsulosin, 0.4 mg, oral, Nightly         PRN medications: acetaminophen **OR** acetaminophen **OR** acetaminophen, bisacodyl, dextrose, dextrose, dextrose 50%, dextrose 50%, glucagon  Assessment/Plan    ASSESSMENT AND PLAN:   Darris Bippus is a 58 y.o. male with a past medical history of BPH, CKD, HFrEF, HTN, pHTN, COPD, T2DM, seizure disorder, schizoaffective disorder who presented to [facility] with UTI and acute urinary retention requiring foley catheterization.    #Urinary retention  #BPH  #UTI  Patient was unable to urinate/ poor stream since the morning of admission. He endorsed dysuria, urgency and frequency. According to the patient he had urinary retention that required a foley be placed once before. Foley was placed in the ED and draining yellow urine. CBC with leukocytosis (12.1). He received a dose of ceftriaxone in the ED. Starting cefpodoxime.   - Foley in place, plan for voiding trial 1/18 with PVR to be recorded  --- if fails may need Urology consult  - F/u outpatient with urology  - Cefpodoxime 200mg  daily  - C/w home tamsulosin 0.4mg  nightly     #Hyperkalemia  #CKD Stage 3b  Follows with nephrology at [facility]. CKD likely due to T2DM, HTN and cardiorenal syndrome. Baseline Cr ranging from 1.9 to 2.2. Baseline GFR is 39 and day of presentation GFR is 36. Patient also with hyperkalemia and takes lokelma at home.   - Lokelma 10g daily  - Holding home lisinopril     CHRONIC PROBLEMS:  #COPD, Gold stage 4/D  not on home O2  #Lung nodule (1.9cm in LLL)  #Pulmonary HTN (WHO group 2/3)  - C/w Trelegy equivalents      #HFrEF (20-25%)  - C/w aspirin 81mg  daily  - C/w atorvastatin 80mg  nightly   - C/w lasix 40mg  daily  - C/w metoprolol XL 50mg  daily     #Schizoaffective disorder  - C/w home carbamazepine 200mg  BID  - C/w home mirtazapine 15mg  night  - C/w home zyprexa 10mg  nightly  - C/w seroquel 400mg  nightly     #T2DM  Last A1c 6.4 in 11/2022  - SSI     #Constipation  - miralax, senna, lactulose    HOME MEDS HELD DURING ADMISSION:  metformin    Hospital Bundle:  Code status: Full Code  Lines: PIV x1  Diet: Adult diet Renal; Pre-dialysis; No added salt  DVT Prophylaxis: On SC heparin  Contact:  CONTACT: @PRIMEMERCNT (<SUBSCRIPT> error)@   @PRIMEMERCNTPH (<SUBSCRIPT> error)@   _____________________________  Garnette Scheuermann MD, MBA  PGY-2, Internal Medicine  [facility]

## 2023-05-29 NOTE — Care Plan (Incomplete)
Assumed care at 07:00. Patient AOX4, calm and cooperative. VSS, SR on the monitor. SpO2 97% RA. Denies pain and discomfort. Continues with productive cough; receiving nebs as ordered. Appetite good, (+) BM this shift. Foley discontinued at 11:25am as ordered. Foley output 750. DTV in 6-8 hours, with orders to bladder scan post void residual. OOB ad lib. Will continue to monitor and assist with all needs.  Problem: Adult Inpatient Plan of Care  Goal: Plan of Care Review  Outcome: Ongoing, Progressing  Goal: Patient-Specific Goal (Individualized)  Outcome: Ongoing, Progressing  Goal: Absence of Hospital-Acquired Illness or Injury  Outcome: Ongoing, Progressing  Goal: Optimal Comfort and Wellbeing  Outcome: Ongoing, Progressing  Goal: Readiness for Transition of Care  Outcome: Ongoing, Progressing

## 2023-05-29 NOTE — Care Plan (Signed)
Goal Outcome Evaluation:  Plan of Care Reviewed With: patient  Progress: no change   Admitted from ED around @2115 : Pt AOx4. NSR on tele. O2 >92% on RA, nebs as sched. Foley in place draining CYU. Standby OOB. S/p lokelma for elevated K in ED. Bed in low position, call bell within reach.    Carron Curie, RN   05/29/2023

## 2023-05-30 LAB — BASIC METABOLIC PANEL
Anion Gap: 10 mmol/L (ref 3–14)
BUN: 26 mg/dL — ABNORMAL HIGH (ref 6–24)
CO2 (Bicarbonate): 24 mmol/L (ref 20–32)
Calcium: 8.6 mg/dL (ref 8.5–10.5)
Chloride: 106 mmol/L (ref 98–110)
Creatinine: 2.01 mg/dL — ABNORMAL HIGH (ref 0.55–1.30)
Glucose: 115 mg/dL (ref 70–139)
Potassium: 4.6 mmol/L (ref 3.6–5.2)
Sodium: 140 mmol/L (ref 135–146)
eGFRcr: 38 mL/min/{1.73_m2} — ABNORMAL LOW (ref >=60)

## 2023-05-30 LAB — CBC WITH DIFFERENTIAL
Basophils %: 1 %
Basophils Absolute: 0.07 10*3/uL (ref 0.00–0.22)
Eosinophils %: 2.5 %
Eosinophils Absolute: 0.17 10*3/uL (ref 0.00–0.50)
Hematocrit: 39.6 % (ref 37.0–53.0)
Hemoglobin: 12.5 g/dL — ABNORMAL LOW (ref 13.0–17.5)
Immature Granulocytes %: 0.4 %
Immature Granulocytes Absolute: 0.03 10*3/uL (ref 0.00–0.10)
Lymphocyte %: 23.6 %
Lymphocytes Absolute: 1.62 10*3/uL (ref 0.70–4.00)
MCH: 30.8 pg (ref 26.0–34.0)
MCHC: 31.6 g/dL (ref 31.0–37.0)
MCV: 97.5 fL (ref 80.0–100.0)
MPV: 9.6 fL (ref 9.1–12.4)
Monocytes %: 10.6 %
Monocytes Absolute: 0.73 10*3/uL (ref 0.38–0.83)
NRBC %: 0 % (ref 0.0–0.0)
NRBC Absolute: 0 10*3/uL (ref 0.00–2.00)
Neutrophil %: 61.9 %
Neutrophils Absolute: 4.24 10*3/uL (ref 1.50–7.95)
Platelets: 215 10*3/uL (ref 150–400)
RBC: 4.06 M/uL — ABNORMAL LOW (ref 4.20–5.90)
RDW-CV: 13.5 % (ref 11.5–14.5)
RDW-SD: 48.8 fL (ref 35.0–51.0)
WBC: 6.9 10*3/uL (ref 4.0–11.0)

## 2023-05-30 LAB — POCT GLUCOSE
POCT Glucose: 102 mg/dL (ref 70–139)
POCT Glucose: 286 mg/dL — ABNORMAL HIGH (ref 70–139)
POCT Glucose: 149 mg/dL — ABNORMAL HIGH (ref 70–139)

## 2023-05-30 LAB — PHOSPHORUS: Phosphorus: 3.5 mg/dL (ref 2.4–4.9)

## 2023-05-30 LAB — MAGNESIUM: Magnesium: 2.4 mg/dL (ref 1.6–2.6)

## 2023-05-30 MED ORDER — amoxicillin (Amoxil) capsule 500 mg
500 | Freq: Three times a day (TID) | ORAL | Status: DC
Start: 2023-05-30 — End: 2023-06-01
  Administered 2023-05-30 – 2023-06-01 (×7): 500 mg via ORAL

## 2023-05-30 MED ORDER — finasteride (Proscar) tablet 5 mg
5 | Freq: Every day | ORAL | Status: DC
Start: 2023-05-30 — End: 2023-06-01
  Administered 2023-05-30 – 2023-06-01 (×3): 5 mg via ORAL

## 2023-05-30 MED FILL — LACTULOSE 20 GRAM/30 ML ORAL SOLUTION: 20 20 gram/30 mL | ORAL | Qty: 30

## 2023-05-30 MED FILL — AMOXICILLIN 500 MG CAPSULE: 500 500 mg | ORAL | Qty: 1

## 2023-05-30 MED FILL — POLYETHYLENE GLYCOL 3350 17 GRAM ORAL POWDER PACKET: 17 17 gram | ORAL | Qty: 1

## 2023-05-30 MED FILL — METOPROLOL SUCCINATE ER 50 MG TABLET,EXTENDED RELEASE 24 HR: 50 50 mg | ORAL | Qty: 1

## 2023-05-30 MED FILL — FUROSEMIDE 40 MG TABLET: 40 40 mg | ORAL | Qty: 1

## 2023-05-30 MED FILL — REVEFENACIN 175 MCG/3 ML SOLUTION FOR NEBULIZATION: 175 175 mcg/3 mL | RESPIRATORY_TRACT | Qty: 3

## 2023-05-30 MED FILL — SODIUM ZIRCONIUM CYCLOSILICATE 10 GRAM ORAL POWDER PACKET: 10 10 gram | ORAL | Qty: 10

## 2023-05-30 MED FILL — INSULIN LISPRO (U-100) 100 UNIT/ML SUBCUTANEOUS SOLUTION: 100 100 unit/mL | SUBCUTANEOUS | Qty: 900

## 2023-05-30 MED FILL — HEPARIN (PORCINE) 5,000 UNIT/ML INJECTION SOLUTION: 5000 5,000 unit/mL | INTRAMUSCULAR | Qty: 1

## 2023-05-30 MED FILL — TAMSULOSIN 0.4 MG CAPSULE: 0.4 0.4 mg | ORAL | Qty: 1

## 2023-05-30 MED FILL — CEFPODOXIME 200 MG TABLET: 200 200 mg | ORAL | Qty: 1

## 2023-05-30 MED FILL — ASPIRIN 81 MG TABLET,DELAYED RELEASE: 81 81 mg | ORAL | Qty: 1

## 2023-05-30 MED FILL — SENNOSIDES 8.6 MG TABLET: 8.6 8.6 mg | ORAL | Qty: 1

## 2023-05-30 MED FILL — BUDESONIDE 0.25 MG/2 ML SUSPENSION FOR NEBULIZATION: 0.25 0.25 mg/2 mL | RESPIRATORY_TRACT | Qty: 2

## 2023-05-30 MED FILL — ARFORMOTEROL 15 MCG/2 ML SOLUTION FOR NEBULIZATION: 15 15 mcg/2 mL | RESPIRATORY_TRACT | Qty: 2

## 2023-05-30 MED FILL — QUETIAPINE 100 MG TABLET: 100 100 mg | ORAL | Qty: 4

## 2023-05-30 MED FILL — OLANZAPINE 5 MG TABLET: 5 5 mg | ORAL | Qty: 2

## 2023-05-30 MED FILL — MIRTAZAPINE 15 MG TABLET: 15 15 mg | ORAL | Qty: 1

## 2023-05-30 MED FILL — ATORVASTATIN 80 MG TABLET: 80 80 mg | ORAL | Qty: 1

## 2023-05-30 MED FILL — FINASTERIDE 5 MG TABLET: 5 5 mg | ORAL | Qty: 1

## 2023-05-30 NOTE — Care Plan (Signed)
Goal Outcome Evaluation:    Pt A&O X4. VSS afebrile. Able to urinate in urinal and bathroom.  Medical team in this morning and talking to pt about out pt appointments due to pt stating "I am having a hard time starting the stream". Pt does have BPH so will follow up with Neurology per team.  Proscar 5mg  given this morning. States he has no pain or discomfort. EKG and pulse ox DC'd. Call bell in reach, will continue with plan of care.

## 2023-05-30 NOTE — Care Plan (Signed)
Problem: Adult Inpatient Plan of Care  Goal: Plan of Care Review  Outcome: Ongoing, Progressing  Goal: Patient-Specific Goal (Individualized)  Outcome: Ongoing, Progressing  Goal: Absence of Hospital-Acquired Illness or Injury  Outcome: Ongoing, Progressing  Goal: Optimal Comfort and Wellbeing  Outcome: Ongoing, Progressing  Goal: Readiness for Transition of Care  Outcome: Ongoing, Progressing     Problem: Urinary Retention  Goal: Effective Urinary Elimination  Outcome: Ongoing, Progressing   Goal Outcome Evaluation:  Plan of Care Reviewed With: patient  Progress: improving

## 2023-05-30 NOTE — Care Plan (Signed)
Goal Outcome Evaluation:  Patient A&O x4, able to make needs known. Vital signs and assessments per flowsheets. Patient able to spontaneously void multiple times this shift. I&Os documented. Patient able to rest/sleep in no apparent distress overnight. Patient able to ambulate independently with steady gait. Patient oriented to/provided with call bell.

## 2023-05-30 NOTE — Care Plan (Signed)
 Goal Outcome Evaluation:  Plan of Care Reviewed With: patient  Progress: improving

## 2023-05-30 NOTE — Progress Notes (Signed)
[facility] Daily Progress Note      Date: May 30, 2023  Time: 8:33 AM  Attending: Janae Sauce, MD  Hospital Day: 0    Subjective      Interval events:   No acute events overnight. Foley removed yesterday, 1/18. Voiding independently with no issues overnight.     Subjective:   Arthur Young reports feeling well today, states that he is urinating spontaneously after catheter removed yesterday. He notes some difficulty in initiating stream w/ urination and discussed starting additional medication (Proscar) with his home Flomax to alleviate this. Also discussed need for Urology follow-up with patient, who is amenable to this. He denies any pain or discomfort w/ urination, no abdominal pain, fever, chills, N/V, endorsing good appetite. No other physical complaints at this time.           Objective      Vital Signs:  Temp:  [36.6 C (97.8 F)-36.9 C (98.4 F)] 36.6 C (97.8 F)  Pulse:  [68-94] 74  Resp:  [9-23] 15  SpO2:  [97 %-100 %] 100 %  BP: (81-104)/(63-81) 100/81   SpO2 100 % on room air.     Ins/Outs:    Intake/Output Summary (Last 24 hours) at 05/30/2023 0833  Last data filed at 05/30/2023 0300  Gross per 24 hour   Intake 720 ml   Output 1775 ml   Net -1055 ml        Weight:   Last 3 Weights         05/28/2023  1144             Weight: 63.5 kg             Physical Exam  Vitals reviewed.   Constitutional:       General: He is not in acute distress.     Appearance: Normal appearance. He is not toxic-appearing.   HENT:      Head: Normocephalic and atraumatic.   Eyes:      General: No scleral icterus.     Extraocular Movements: Extraocular movements intact.   Cardiovascular:      Rate and Rhythm: Normal rate and regular rhythm.      Heart sounds: Normal heart sounds. No murmur heard.     No friction rub. No gallop.   Pulmonary:      Effort: Pulmonary effort is normal. No respiratory distress.      Breath sounds: Normal breath sounds. No stridor. No wheezing, rhonchi or rales.   Abdominal:      General: Bowel  sounds are normal. There is no distension.      Palpations: Abdomen is soft.      Tenderness: There is no abdominal tenderness.   Genitourinary:     Comments: No Foley in place. Urinal at bedside w/ CYU, no blood noted.   Musculoskeletal:      Right lower leg: No edema.      Left lower leg: No edema.   Skin:     General: Skin is warm and dry.   Neurological:      Mental Status: He is alert.          Data/Results:  Labs, microbiology, and imaging reviewed on team rounds.    Medications:  revefenacin, 175 mcg, nebulization, Daily   And  arformoterol, 15 mcg, nebulization, BID   And  budesonide, 0.25 mg, nebulization, BID  aspirin, 81 mg, oral, q AM  atorvastatin, 80 mg, oral, Nightly  carBAMazepine, 200 mg, oral,  BID  cefpodoxime, 200 mg, oral, BID  furosemide, 40 mg, oral, q AM  heparin (porcine), 5,000 Units, subcutaneous, q8h [facility]  insulin lispro, 0-5 Units, subcutaneous, TID AC  lactulose, 10 g, oral, Daily  [Held by provider] lisinopril, 10 mg, oral, Daily  metoprolol succinate XL, 50 mg, oral, Daily  mirtazapine, 15 mg, oral, Nightly  OLANZapine, 10 mg, oral, Nightly  polyethylene glycol, 17 g, oral, Daily  QUEtiapine, 400 mg, oral, Nightly  sennosides, 1 tablet, oral, Nightly  sodium zirconium cyclosilicate, 10 g, oral, Daily  tamsulosin, 0.4 mg, oral, Nightly         PRN medications: acetaminophen **OR** acetaminophen **OR** acetaminophen, bisacodyl, dextrose, dextrose, dextrose 50%, dextrose 50%, glucagon            Assessment/Plan    ASSESSMENT AND PLAN:   Arthur Young is a 58 y.o. male with a past medical history of BPH, CKD, HFrEF, HTN, pHTN, COPD, T2DM, seizure disorder, schizoaffective disorder who presented to [place] with UTI and acute urinary retention requiring foley catheterization. Passed voiding trial and Foley removed 1/18, continuing to void spontaneously. Remains on antibiotics for UTI.     #Urinary retention  #BPH  #E. faecalis UTI  Patient was unable to urinate/ poor stream since the morning of  admission. He endorsed dysuria, urgency and frequency. According to the patient he had urinary retention that required a foley be placed once before. Foley was placed in the ED and draining yellow urine. CBC with leukocytosis (12.1). He received a dose of ceftriaxone in the ED. He was started on cefpodoxime, which has since been discontinued and transitioned to start PO amoxicillin after urine cultures grew E. faecalis.    - PVR 50ccs during voiding trial 1/18, Foley subsequently removed and continuing to void spontaneously   --- if further concern for retention requiring re-placement of Foley, may need Urology consult.   - F/u outpatient with urology  - STOPPED Cefpodoxime 200mg  daily  - STARTING PO amoxicillin 500 mg TID  - C/w home tamsulosin 0.4mg  nightly  - STARTING finasteride 5 mg daily      #Hyperkalemia - resolved   #CKD Stage 3b  Follows with nephrology at [facility]. CKD likely due to T2DM, HTN and cardiorenal syndrome. Baseline Cr ranging from 1.9 to 2.2. Baseline GFR is 39 and day of presentation GFR is 36. Patient also with hyperkalemia and takes lokelma at home.   - Lokelma 10g daily  - Holding home lisinopril     CHRONIC PROBLEMS:  #COPD, Gold stage 4/D not on home O2  #Lung nodule (1.9cm in LLL)  #Pulmonary HTN (WHO group 2/3)  - C/w Trelegy equivalents      #HFrEF (20-25%)  - C/w aspirin 81mg  daily  - C/w atorvastatin 80mg  nightly   - C/w lasix 40mg  daily  - C/w metoprolol XL 50mg  daily     #Schizoaffective disorder  - C/w home carbamazepine 200mg  BID  - C/w home mirtazapine 15mg  night  - C/w home zyprexa 10mg  nightly  - C/w seroquel 400mg  nightly     #T2DM  Last A1c 6.4 in 11/2022  - SSI     #Constipation  - miralax, senna, lactulose    HOME MEDS HELD DURING ADMISSION:  metformin    Hospital Bundle:  Code status: Full Code  Lines: PIV x1  Diet: Adult diet Renal; Pre-dialysis; No added salt  DVT Prophylaxis: On SC heparin  Contact:  CONTACT: @PRIMEMERCNT (<SUBSCRIPT> error)@   @PRIMEMERCNTPH (<SUBSCRIPT>  error)@   _____________________________  Georgana Curio  Lowry Ram, MD, MS  PGY-1  Internal Medicine  [facility]

## 2023-05-31 LAB — CBC WITH DIFFERENTIAL
Basophils %: 0.7 %
Basophils Absolute: 0.06 10*3/uL (ref 0.00–0.22)
Eosinophils %: 2.4 %
Eosinophils Absolute: 0.2 10*3/uL (ref 0.00–0.50)
Hematocrit: 40 % (ref 37.0–53.0)
Hemoglobin: 12.6 g/dL — ABNORMAL LOW (ref 13.0–17.5)
Immature Granulocytes %: 0.5 %
Immature Granulocytes Absolute: 0.04 10*3/uL (ref 0.00–0.10)
Lymphocyte %: 17.2 %
Lymphocytes Absolute: 1.44 10*3/uL (ref 0.70–4.00)
MCH: 30.7 pg (ref 26.0–34.0)
MCHC: 31.5 g/dL (ref 31.0–37.0)
MCV: 97.3 fL (ref 80.0–100.0)
MPV: 9.7 fL (ref 9.1–12.4)
Monocytes %: 8.6 %
Monocytes Absolute: 0.72 10*3/uL (ref 0.38–0.83)
NRBC %: 0 % (ref 0.0–0.0)
NRBC Absolute: 0 10*3/uL (ref 0.00–2.00)
Neutrophil %: 70.6 %
Neutrophils Absolute: 5.89 10*3/uL (ref 1.50–7.95)
Platelets: 208 10*3/uL (ref 150–400)
RBC: 4.11 M/uL — ABNORMAL LOW (ref 4.20–5.90)
RDW-CV: 13.3 % (ref 11.5–14.5)
RDW-SD: 47.8 fL (ref 35.0–51.0)
WBC: 8.4 10*3/uL (ref 4.0–11.0)

## 2023-05-31 LAB — BASIC METABOLIC PANEL
Anion Gap: 11 mmol/L (ref 3–14)
BUN: 24 mg/dL (ref 6–24)
CO2 (Bicarbonate): 23 mmol/L (ref 20–32)
Calcium: 8.8 mg/dL (ref 8.5–10.5)
Chloride: 104 mmol/L (ref 98–110)
Creatinine: 1.84 mg/dL — ABNORMAL HIGH (ref 0.55–1.30)
Glucose: 136 mg/dL (ref 70–139)
Potassium: 4.5 mmol/L (ref 3.6–5.2)
Sodium: 138 mmol/L (ref 135–146)
eGFRcr: 42 mL/min/{1.73_m2} — ABNORMAL LOW (ref >=60)

## 2023-05-31 LAB — MAGNESIUM: Magnesium: 2.4 mg/dL (ref 1.6–2.6)

## 2023-05-31 LAB — POCT GLUCOSE
POCT Glucose: 119 mg/dL (ref 70–139)
POCT Glucose: 113 mg/dL (ref 70–139)
POCT Glucose: 88 mg/dL (ref 70–139)

## 2023-05-31 LAB — PHOSPHORUS: Phosphorus: 3.9 mg/dL (ref 2.4–4.9)

## 2023-05-31 MED FILL — FUROSEMIDE 40 MG TABLET: 40 40 mg | ORAL | Qty: 1

## 2023-05-31 MED FILL — SENNOSIDES 8.6 MG TABLET: 8.6 8.6 mg | ORAL | Qty: 1

## 2023-05-31 MED FILL — ARFORMOTEROL 15 MCG/2 ML SOLUTION FOR NEBULIZATION: 15 15 mcg/2 mL | RESPIRATORY_TRACT | Qty: 2

## 2023-05-31 MED FILL — TAMSULOSIN 0.4 MG CAPSULE: 0.4 0.4 mg | ORAL | Qty: 1

## 2023-05-31 MED FILL — QUETIAPINE 100 MG TABLET: 100 100 mg | ORAL | Qty: 4

## 2023-05-31 MED FILL — FINASTERIDE 5 MG TABLET: 5 5 mg | ORAL | Qty: 1

## 2023-05-31 MED FILL — METOPROLOL SUCCINATE ER 50 MG TABLET,EXTENDED RELEASE 24 HR: 50 50 mg | ORAL | Qty: 1

## 2023-05-31 MED FILL — ASPIRIN 81 MG TABLET,DELAYED RELEASE: 81 81 mg | ORAL | Qty: 1

## 2023-05-31 MED FILL — ATORVASTATIN 80 MG TABLET: 80 80 mg | ORAL | Qty: 1

## 2023-05-31 MED FILL — BUDESONIDE 0.25 MG/2 ML SUSPENSION FOR NEBULIZATION: 0.25 0.25 mg/2 mL | RESPIRATORY_TRACT | Qty: 2

## 2023-05-31 MED FILL — HEPARIN (PORCINE) 5,000 UNIT/ML INJECTION SOLUTION: 5000 5,000 unit/mL | INTRAMUSCULAR | Qty: 1

## 2023-05-31 MED FILL — REVEFENACIN 175 MCG/3 ML SOLUTION FOR NEBULIZATION: 175 175 mcg/3 mL | RESPIRATORY_TRACT | Qty: 3

## 2023-05-31 MED FILL — MIRTAZAPINE 15 MG TABLET: 15 15 mg | ORAL | Qty: 1

## 2023-05-31 MED FILL — OLANZAPINE 5 MG TABLET: 5 5 mg | ORAL | Qty: 2

## 2023-05-31 MED FILL — POLYETHYLENE GLYCOL 3350 17 GRAM ORAL POWDER PACKET: 17 17 gram | ORAL | Qty: 1

## 2023-05-31 MED FILL — LACTULOSE 20 GRAM/30 ML ORAL SOLUTION: 20 20 gram/30 mL | ORAL | Qty: 30

## 2023-05-31 MED FILL — SODIUM ZIRCONIUM CYCLOSILICATE 10 GRAM ORAL POWDER PACKET: 10 10 gram | ORAL | Qty: 10

## 2023-05-31 NOTE — Progress Notes (Signed)
[facility] Daily Progress Note      Date: May 31, 2023  Time: 11:35 AM  Attending: Janae Sauce, MD  Hospital Day: 3    Subjective    Interval events:   NAEO    Subjective:   Patient reports that he is feeling well. Is voiding well without foley. Denies any dysuria, fevers, chills. Reports some constipation.     Objective      Vital Signs:  Temp:  [36.6 C (97.9 F)-36.8 C (98.2 F)] 36.6 C (97.9 F)  Pulse:  [63-74] 74  Resp:  [18] 18  SpO2:  [98 %-100 %] 99 %  BP: (102-115)/(67-79) 110/70   SpO2 99 % on room air.     Ins/Outs:    Intake/Output Summary (Last 24 hours) at 05/31/2023 1135  Last data filed at 05/31/2023 9147  Gross per 24 hour   Intake --   Output 1020 ml   Net -1020 ml        Weight:   63.5kg    Physical Exam  Vitals reviewed.   Constitutional:       General: He is not in acute distress.     Appearance: Normal appearance. He is not toxic-appearing.   HENT:      Head: Normocephalic and atraumatic.   Eyes:      General: No scleral icterus.     Extraocular Movements: Extraocular movements intact.   Cardiovascular:      Rate and Rhythm: Normal rate and regular rhythm.      Heart sounds: Normal heart sounds. No murmur heard.     No friction rub. No gallop.   Pulmonary:      Effort: Pulmonary effort is normal. No respiratory distress.      Breath sounds: Normal breath sounds. No stridor. No wheezing, rhonchi or rales.   Abdominal:      General: Bowel sounds are normal. There is no distension.      Palpations: Abdomen is soft.      Tenderness: There is no abdominal tenderness.   Genitourinary:     Comments: No Foley in place. Urinal at bedside w/ CYU, no blood noted.   Musculoskeletal:      Right lower leg: No edema.      Left lower leg: No edema.   Skin:     General: Skin is warm and dry.   Neurological:      Mental Status: He is alert.          Data/Results:  Labs, microbiology, and imaging reviewed on team rounds.    Medications:  amoxicillin, 500 mg, oral, q8h [facility]  revefenacin, 175 mcg,  nebulization, Daily   And  arformoterol, 15 mcg, nebulization, BID   And  budesonide, 0.25 mg, nebulization, BID  aspirin, 81 mg, oral, q AM  atorvastatin, 80 mg, oral, Nightly  carBAMazepine, 200 mg, oral, BID  finasteride, 5 mg, oral, Daily  furosemide, 40 mg, oral, q AM  heparin (porcine), 5,000 Units, subcutaneous, q8h [facility]  insulin lispro, 0-5 Units, subcutaneous, TID AC  lactulose, 10 g, oral, Daily  [Held by provider] lisinopril, 10 mg, oral, Daily  metoprolol succinate XL, 50 mg, oral, Daily  mirtazapine, 15 mg, oral, Nightly  OLANZapine, 10 mg, oral, Nightly  polyethylene glycol, 17 g, oral, Daily  QUEtiapine, 400 mg, oral, Nightly  sennosides, 1 tablet, oral, Nightly  sodium zirconium cyclosilicate, 10 g, oral, Daily  tamsulosin, 0.4 mg, oral, Nightly         PRN medications:  acetaminophen **OR** acetaminophen **OR** acetaminophen, bisacodyl, dextrose, dextrose, dextrose 50%, dextrose 50%, glucagon            Assessment/Plan    ASSESSMENT AND PLAN:   Jru Randel is a 58 y.o. male with a past medical history of BPH, CKD, HFrEF, HTN, pHTN, COPD, T2DM, seizure disorder, schizoaffective disorder who presented to [place] with UTI and acute urinary retention requiring foley catheterization. Passed voiding trial and Foley removed 1/18, continuing to void spontaneously. Remains on antibiotics for UTI.     #Urinary retention  #BPH  #E. faecalis UTI  Patient was unable to urinate/ poor stream since the morning of admission. He endorsed dysuria, urgency and frequency. According to the patient he had urinary retention that required a foley be placed once before. Foley was placed in the ED and draining yellow urine. CBC with leukocytosis (12.1). He received a dose of ceftriaxone in the ED. He was started on cefpodoxime, which has since been discontinued and transitioned to start PO amoxicillin after urine cultures grew E. faecalis.    - PVR 50ccs during voiding trial 1/18, Foley subsequently removed and continuing to void  spontaneously   - F/u outpatient with urology  - S/p Cefpodoxime 200mg  daily  - Continue PO amoxicillin 500 mg TID, [facility] pharmacy to see about bid dosing for discharge  - C/w home tamsulosin 0.4mg  nightly  - Continue finasteride 5 mg daily      #Hyperkalemia - resolved   #CKD Stage 3b  Follows with nephrology at [facility]. CKD likely due to T2DM, HTN and cardiorenal syndrome. Baseline Cr ranging from 1.9 to 2.2. Baseline GFR is 39 and day of presentation GFR is 36. Patient also with hyperkalemia and takes lokelma at home.   - Lokelma 10g daily  - Holding home lisinopril     CHRONIC PROBLEMS:  #COPD, Gold stage 4/D not on home O2  #Lung nodule (1.9cm in LLL)  #Pulmonary HTN (WHO group 2/3)  - C/w Trelegy equivalents      #HFrEF (20-25%)  - C/w aspirin 81mg  daily  - C/w atorvastatin 80mg  nightly   - C/w lasix 40mg  daily  - C/w metoprolol XL 50mg  daily     #Schizoaffective disorder  - C/w home carbamazepine 200mg  BID  - C/w home mirtazapine 15mg  night  - C/w home zyprexa 10mg  nightly  - C/w seroquel 400mg  nightly     #T2DM  Last A1c 6.4 in 11/2022  - SSI     #Constipation  - miralax, senna, lactulose    HOME MEDS HELD DURING ADMISSION:  metformin    Hospital Bundle:  Code status: Full Code  Lines: PIV x1  Diet: Adult diet Renal; Pre-dialysis; No added salt  DVT Prophylaxis: On SC heparin  Contact:  CONTACT: @PRIMEMERCNT (<SUBSCRIPT> error)@   @PRIMEMERCNTPH (<SUBSCRIPT> error)@   _____________________________  Marvel Plan, MD, MPH  PGY1, Internal Medicine  [facility]

## 2023-05-31 NOTE — Progress Notes (Signed)
Case Management Progress Note  Plan reviewed with medical team.     Medical team plan:    Med cleared 05/31/2023, however group home not able to accommodate over holiday 1/20. Planned DC 1/21, avoidable day documented.     CM Interventions/Next Steps: Left VM for [facility] manager Rockne Menghini T# [phone]    Anticipated Discharge Plan/Needs: Return to [facility], [address] [place], Personnel officer of [employer] [phone]

## 2023-05-31 NOTE — Care Plan (Signed)
Problem: Adult Inpatient Plan of Care  Goal: Plan of Care Review  Outcome: Ongoing, Progressing  Flowsheets (Taken 05/31/2023 1812)  Progress: improving  Plan of Care Reviewed With: patient     Problem: Adult Inpatient Plan of Care  Goal: Optimal Comfort and Wellbeing  Outcome: Ongoing, Progressing  Intervention: Monitor Pain and Promote Comfort  Flowsheets (Taken 05/31/2023 1812)  Pain Management Interventions:   care clustered   position adjusted   pillow support provided   quiet environment facilitated  Intervention: Provide Person-Centered Care  Flowsheets (Taken 05/31/2023 1812)  Trust Relationship/Rapport:   care explained   choices provided   questions answered   questions encouraged     Problem: Urinary Retention  Goal: Effective Urinary Elimination  Outcome: Ongoing, Progressing  Intervention: Promote Effective Urine Elimination  Flowsheets (Taken 05/31/2023 1812)  Bowel Function Promotion: straining discouraged  Urinary Elimination Promotion: frequent voiding encouraged   Goal Outcome Evaluation:  Plan of Care Reviewed With: patient  Progress: improving       0700-1900  NAE      Visit Vitals  BP 101/70 (BP Location: Right arm, Patient Position: Sitting)   Pulse 78   Temp 36.8 C (98.2 F) (Oral)   Resp 18

## 2023-05-31 NOTE — Hospital Course (Addendum)
Patient Name: Arthur Young  Date of Birth: May 15, 1965  MRN: [id number]  Discharge Provider: Lilia Pro, MD  Primary Care Physician: Etter Sjogren, MD     Admission Date: 05/28/2023 - 06/01/23       Arthur Young is a 58 y.o. year old male with history of BPH, CKD, HFrEF, HTN, pHTN, COPD, T2DM, seizure disorder, and schizoaffective disorder.    Discharge Disposition  The patient was discharged back to [facility].    Brief HPI:  Patient was unable to urinate/ poor stream morning of admission. He endorsed dysuria, urgency and frequency. According to the patient he had urinary retention that required a foley be placed once before. He also endorses constipation. He denies fever, chills, sick contacts, chest pain, SOB, N/V/D.     Hospital Course By Problem:  #Urinary retention  #BPH  #E. faecalis UTI  Patient presented with poor stream and inability to urinate since morning of admission. Had foley placed with yellow urine draining. Patient was started on CTX 1/17 for UTI concern given dysuria and urgency. He was transitioned to cefpodoxime 200mg  BID on 1/18. Urine culture growing E faecalis sensitive to ampicillin and he was transitioned to amoxicillin 500mg  q8hr on 1/19. He passed a voiding trial on 1/18 and did not need foley replaced. Was started on finasteride on 1/19. Overall picture a combination of urinary retention and UTI iso BPH.  []  CONTINUE Amoxicillin 875mg  BID for total 5 day course (last day 1/23)  []  f/u with Urology outpatient 08/05/23    #Hyperkalemia  #CKD Stage 3b  Patient presented with K 5.8, normally on daily lokelma at home. Was given additional dose of lokelma 1/17 with improvement in K. Was continued on daily 10g lokelma (increased from home 5g QOD).  []  Will reach out to outpatient Nephrologist for follow-up  --[]  consider repeat labs and starting lisinopril outpatient  --[]  consider initiation of SGLT-2  []  CONTINUE Lokelma 10g daily  []  f/u with PCP to discuss restarting lisinopril (Appt  1/27)    #HTN  Patient with hx of HTN on lisinopril outpatient. Presented with hyperkalemia and possible AKI, so lisinopril was held. BPs remained soft 100/70s during admission, so restarting lisinopril was deferred.  []  f/u with PCP to discuss restarting lisinopril (Appt 1/27)    Discharge Medication List:  Home Medications After Discharge    Scheduled   amoxicillin (Amoxil) 875 mg tablet, Take 1 tablet (875 mg) by mouth twice daily for 5 doses.   aspirin 81 mg EC tablet, Take 81 mg by mouth in the morning.   atorvastatin (Lipitor) 80 mg tablet, Take 80 mg by mouth at bedtime.   brimonidine (AlphaGAN P) 0.15 % ophthalmic solution, Administer 1 drop into affected eye(s) three times daily.   carBAMazepine (TEGretol) 200 mg tablet, Take 200 mg by mouth twice daily.   dorzolamide (Trusopt) 2 % ophthalmic solution, Administer 1 drop into affected eye(s) twice daily.   finasteride (Proscar) 5 mg tablet, Take 1 tablet (5 mg) by mouth once daily. Do not crush, chew, or split.   fluticasone-umeclidin-vilanter (Trelegy Ellipta) 100-62.5-25 mcg blister with device, Inhale 1 Inhalation in the morning.   furosemide (Lasix) 40 mg tablet, Take 40 mg by mouth in the morning.   metFORMIN XR (Glucophage-XR) 500 mg 24 hr tablet, Take 500 mg by mouth twice daily.   metoprolol succinate XL (Toprol-XL) 50 mg 24 hr tablet, TAKE ONE TABLET BY MOUTH ONCE DAILY   mirtazapine (Remeron) 15 mg tablet, Take 15  mg by mouth at bedtime.   OLANZapine (ZyPREXA) 10 mg tablet, Take 10 mg by mouth at bedtime.   senna 8.6 mg tablet, Take 2 tablets by mouth twice daily.   sodium zirconium cyclosilicate (Lokelma) 10 gram packet, Take 10 g by mouth once daily. TAKE 5 GRAMS BY MOUTH EVERY OTHER DAY   Stool Softener 100 mg capsule, Take 100 mg by mouth twice daily.   tamsulosin (Flomax) 0.4 mg 24 hr capsule, Take 0.4 mg by mouth at bedtime.    PRN   albuterol 90 mcg/actuation inhaler, Inhale 2 puffs every 6 (six) hours if needed for wheezing.   benzonatate  (Tessalon) 100 mg capsule, Take 200 mg by mouth every 8 (eight) hours if needed.   bisacodyl (Dulcolax) 5 mg EC tablet, Take 10 mg by mouth if needed each day.    No Frequency   ammonium lactate (Lac-Hydrin) 12 % lotion, SMARTSIG:Application Topical   diclofenac (Voltaren) 1 % topical gel, Apply topically.   Enulose 10 gram/15 mL solution, SMARTSIG:15 Milliliter(s) By Mouth Every Morning   hydrocortisone 2.5 % cream, SMARTSIG:Topical   QUEtiapine (SEROqueL) 400 mg tablet, Take 400 mg by mouth.    Discontinued Medications:  Lisinopril (hyperkalemia and normotension)  --[]  discuss restarting with PCP and Nephrologist    Changed Medications:  Lokelma 10g daily (increased from 5g QOD)    New Medications:  Amoxicillin 875mg  BID for total 5 day course (last day 1/23)  Finasteride 5mg  daily    Imaging:  None

## 2023-05-31 NOTE — Care Plan (Signed)
Assumed care of patient 1900-0700    A/Ox4. VSS. RA. No tele order. Denies pain this shift. Independent with care. Lung sounds clear. Bowel sounds present. Continent of bladder and bowels. Renal diet. PO abx administered as ordered. Pt endorsed pain at PIV site- hard and painful to palpate. Nursing communication placed for okay for no PIV. Safety measures maintained. Call light in reach.    Lucretia Field, RN

## 2023-06-01 ENCOUNTER — Ambulatory Visit: Payer: MEDICARE | Attending: Physician Assistant | Primary: Family Medicine

## 2023-06-01 LAB — CBC WITH DIFFERENTIAL
Basophils %: 0.4 %
Basophils Absolute: 0.05 10*3/uL (ref 0.00–0.22)
Eosinophils %: 1.3 %
Eosinophils Absolute: 0.15 10*3/uL (ref 0.00–0.50)
Hematocrit: 38.9 % (ref 37.0–53.0)
Hemoglobin: 12.2 g/dL — ABNORMAL LOW (ref 13.0–17.5)
Immature Granulocytes %: 0.4 %
Immature Granulocytes Absolute: 0.05 10*3/uL (ref 0.00–0.10)
Lymphocyte %: 15.2 %
Lymphocytes Absolute: 1.7 10*3/uL (ref 0.70–4.00)
MCH: 30.8 pg (ref 26.0–34.0)
MCHC: 31.4 g/dL (ref 31.0–37.0)
MCV: 98.2 fL (ref 80.0–100.0)
MPV: 9.8 fL (ref 9.1–12.4)
Monocytes %: 8.4 %
Monocytes Absolute: 0.94 10*3/uL — ABNORMAL HIGH (ref 0.38–0.83)
NRBC %: 0 % (ref 0.0–0.0)
NRBC Absolute: 0 10*3/uL (ref 0.00–2.00)
Neutrophil %: 74.3 %
Neutrophils Absolute: 8.32 10*3/uL — ABNORMAL HIGH (ref 1.50–7.95)
Platelets: 208 10*3/uL (ref 150–400)
RBC: 3.96 M/uL — ABNORMAL LOW (ref 4.20–5.90)
RDW-CV: 13.3 % (ref 11.5–14.5)
RDW-SD: 48.3 fL (ref 35.0–51.0)
WBC: 11.2 10*3/uL — ABNORMAL HIGH (ref 4.0–11.0)

## 2023-06-01 LAB — BASIC METABOLIC PANEL
Anion Gap: 10 mmol/L (ref 3–14)
BUN: 22 mg/dL (ref 6–24)
CO2 (Bicarbonate): 24 mmol/L (ref 20–32)
Calcium: 8.9 mg/dL (ref 8.5–10.5)
Chloride: 106 mmol/L (ref 98–110)
Creatinine: 1.77 mg/dL — ABNORMAL HIGH (ref 0.55–1.30)
Glucose: 126 mg/dL (ref 70–139)
Potassium: 4.5 mmol/L (ref 3.6–5.2)
Sodium: 140 mmol/L (ref 135–146)
eGFRcr: 44 mL/min/{1.73_m2} — ABNORMAL LOW (ref >=60)

## 2023-06-01 LAB — MAGNESIUM: Magnesium: 2.6 mg/dL (ref 1.6–2.6)

## 2023-06-01 LAB — POCT GLUCOSE
POCT Glucose: 115 mg/dL (ref 70–139)
POCT Glucose: 109 mg/dL (ref 70–139)

## 2023-06-01 LAB — URINE CULTURE: Urine Culture: >100000 — AB

## 2023-06-01 LAB — PHOSPHORUS: Phosphorus: 4.2 mg/dL (ref 2.4–4.9)

## 2023-06-01 MED ORDER — finasteride (Proscar) 5 mg tablet
5 | ORAL_TABLET | Freq: Every day | ORAL | 0 refills | 28.00000 days | Status: AC
Start: 2023-06-01 — End: 2023-07-01

## 2023-06-01 MED ORDER — sodium zirconium cyclosilicate (Lokelma) 10 gram packet
10 | Freq: Every day | ORAL | 0 refills | 30.00000 days | Status: AC
Start: 2023-06-01 — End: 2023-07-01

## 2023-06-01 MED ORDER — amoxicillin (Amoxil) 875 mg tablet
875 | ORAL_TABLET | Freq: Two times a day (BID) | ORAL | 0 refills | Status: DC
Start: 2023-06-01 — End: 2023-06-08

## 2023-06-01 MED FILL — QUETIAPINE 100 MG TABLET: 100 100 mg | ORAL | Qty: 4

## 2023-06-01 MED FILL — ASPIRIN 81 MG TABLET,DELAYED RELEASE: 81 81 mg | ORAL | Qty: 1

## 2023-06-01 MED FILL — ARFORMOTEROL 15 MCG/2 ML SOLUTION FOR NEBULIZATION: 15 15 mcg/2 mL | RESPIRATORY_TRACT | Qty: 2

## 2023-06-01 MED FILL — FUROSEMIDE 40 MG TABLET: 40 40 mg | ORAL | Qty: 1

## 2023-06-01 MED FILL — SENNOSIDES 8.6 MG TABLET: 8.6 8.6 mg | ORAL | Qty: 1

## 2023-06-01 MED FILL — REVEFENACIN 175 MCG/3 ML SOLUTION FOR NEBULIZATION: 175 175 mcg/3 mL | RESPIRATORY_TRACT | Qty: 3

## 2023-06-01 MED FILL — METOPROLOL SUCCINATE ER 50 MG TABLET,EXTENDED RELEASE 24 HR: 50 50 mg | ORAL | Qty: 1

## 2023-06-01 MED FILL — LACTULOSE 20 GRAM/30 ML ORAL SOLUTION: 20 20 gram/30 mL | ORAL | Qty: 30

## 2023-06-01 MED FILL — MIRTAZAPINE 15 MG TABLET: 15 15 mg | ORAL | Qty: 1

## 2023-06-01 MED FILL — SODIUM ZIRCONIUM CYCLOSILICATE 10 GRAM ORAL POWDER PACKET: 10 10 gram | ORAL | Qty: 10

## 2023-06-01 MED FILL — OLANZAPINE 5 MG TABLET: 5 5 mg | ORAL | Qty: 2

## 2023-06-01 MED FILL — POLYETHYLENE GLYCOL 3350 17 GRAM ORAL POWDER PACKET: 17 17 gram | ORAL | Qty: 1

## 2023-06-01 MED FILL — TAMSULOSIN 0.4 MG CAPSULE: 0.4 0.4 mg | ORAL | Qty: 1

## 2023-06-01 MED FILL — BUDESONIDE 0.25 MG/2 ML SUSPENSION FOR NEBULIZATION: 0.25 0.25 mg/2 mL | RESPIRATORY_TRACT | Qty: 2

## 2023-06-01 MED FILL — FINASTERIDE 5 MG TABLET: 5 5 mg | ORAL | Qty: 1

## 2023-06-01 MED FILL — HEPARIN (PORCINE) 5,000 UNIT/ML INJECTION SOLUTION: 5000 5,000 unit/mL | INTRAMUSCULAR | Qty: 1

## 2023-06-01 MED FILL — ATORVASTATIN 80 MG TABLET: 80 80 mg | ORAL | Qty: 1

## 2023-06-01 NOTE — Discharge Instructions (Signed)
Dear Arthur Young,    You were recently admitted to [facility] for urinary retention. You were found to have a urinary tract infection, and antibiotics were started. You had a foley catheter for the urinary retention, but this was removed and you were able to urinate on your own. This is most likely from an enlarged prostate, so you are being started on a new medication for that. You are being set up for a Urology appointment in March to discuss it. You should contact your PCP to schedule an appointment in the next 1-2 weeks for post-hospitalization follow-up.    Please see the instructions below for the exact medications that you should take as well as symptoms for which you should seek further care. Please do not hesitate to contact us with any questions or concerns. Your scheduled follow up appointments are attached. Thank you for allowing [place] to participate in your care. It was a pleasure taking care of you while you were here.     Best Regards,  Your [place] Care Team      -------------------------------------------------------------------------------------------------    Please call your primary care doctor, go to an emergency room or call 911 if you are experiencing any of the following symptoms:   Shortness of breath or trouble breathing   Fever >100.32F  Light-headedness/loss of consciousness/ dizziness   Persistent nausea, vomiting, or severe abdominal pain  Chest pain/palpitations/sudden sweating with radiation down both arms   Confusion   Limb weakness, difficulty speaking or fall  Any other concerning symptoms   In case of an emergency, please call 911, and not your PCP's office, in order to get immediate medical attention     Medication Changes   1. Some of your medications were changed during your visit   2. Please refer to your medication reconciliation sheet for a full list of your discharge medications.   3. Do not start any new medications without discussing them with your PCP.      Appointments   Future Appointments   Date Time Provider Department Center   08/05/2023  1:30 PM Anise Salvo, MD [facility]   11/23/2023  1:30 PM Ambrose Mantle, PA [facility]         General Discharge Instructions    Diet: regular diet as tolerated   Activity: as tolerated   Make sure to bring this paperwork with you to all your follow-up and primary care appointments regarding this admission.

## 2023-06-01 NOTE — Telephone Encounter (Signed)
Hello,    Long Pharmacist is calling from [facility] [place] - [address] AT [address]  medication called  Lokelma 10 mg  is back order but has 5 mg Best number [phone]

## 2023-06-01 NOTE — Nursing Note (Signed)
Patient ordered for D/C. D/C paperwork and education provided w/ patient understanding. Patient D/C to group home. Patient left unit @ 1603.

## 2023-06-01 NOTE — Discharge Summary (Signed)
Discharge Summary    Discharge Diagnosis  Acute urinary retention    Hospital Course   Patient Name: Arthur Young  Date of Birth: November 30, 1965  MRN: [id number]  Discharge Provider: Lilia Pro, MD  Primary Care Physician: Etter Sjogren, MD     Admission Date: 05/28/2023 - 06/01/23       Arthur Young is a 58 y.o. year old male with history of BPH, CKD, HFrEF, HTN, pHTN, COPD, T2DM, seizure disorder, and schizoaffective disorder.    Discharge Disposition  The patient was discharged back to [facility].    Brief HPI:  Patient was unable to urinate/ poor stream morning of admission. He endorsed dysuria, urgency and frequency. According to the patient he had urinary retention that required a foley be placed once before. He also endorses constipation. He denies fever, chills, sick contacts, chest pain, SOB, N/V/D.     Hospital Course By Problem:  #Urinary retention  #BPH  #E. faecalis UTI  Patient presented with poor stream and inability to urinate since morning of admission. Had foley placed with yellow urine draining. Patient was started on CTX 1/17 for UTI concern given dysuria and urgency. He was transitioned to cefpodoxime 200mg  BID on 1/18. Urine culture growing E faecalis sensitive to ampicillin and he was transitioned to amoxicillin 500mg  q8hr on 1/19. He passed a voiding trial on 1/18 and did not need foley replaced. Was started on finasteride on 1/19. Overall picture a combination of urinary retention and UTI iso BPH.  []  CONTINUE Amoxicillin 875mg  BID for total 5 day course (last day 1/23)  []  f/u with Urology outpatient 08/05/23    #Hyperkalemia  #CKD Stage 3b  Patient presented with K 5.8, normally on daily lokelma at home. Was given additional dose of lokelma 1/17 with improvement in K. Was continued on daily 10g lokelma (increased from home 5g QOD).  []  Will reach out to outpatient Nephrologist for follow-up  --[]  consider repeat labs and starting lisinopril outpatient  --[]  consider initiation of  SGLT-2  []  CONTINUE Lokelma 10g daily  []  f/u with PCP to discuss restarting lisinopril (Appt 1/27)    #HTN  Patient with hx of HTN on lisinopril outpatient. Presented with hyperkalemia and possible AKI, so lisinopril was held. BPs remained soft 100/70s during admission, so restarting lisinopril was deferred.  []  f/u with PCP to discuss restarting lisinopril (Appt 1/27)    Discharge Medication List:  Home Medications After Discharge    Scheduled   amoxicillin (Amoxil) 875 mg tablet, Take 1 tablet (875 mg) by mouth twice daily for 5 doses.   aspirin 81 mg EC tablet, Take 81 mg by mouth in the morning.   atorvastatin (Lipitor) 80 mg tablet, Take 80 mg by mouth at bedtime.   brimonidine (AlphaGAN P) 0.15 % ophthalmic solution, Administer 1 drop into affected eye(s) three times daily.   carBAMazepine (TEGretol) 200 mg tablet, Take 200 mg by mouth twice daily.   dorzolamide (Trusopt) 2 % ophthalmic solution, Administer 1 drop into affected eye(s) twice daily.   finasteride (Proscar) 5 mg tablet, Take 1 tablet (5 mg) by mouth once daily. Do not crush, chew, or split.   fluticasone-umeclidin-vilanter (Trelegy Ellipta) 100-62.5-25 mcg blister with device, Inhale 1 Inhalation in the morning.   furosemide (Lasix) 40 mg tablet, Take 40 mg by mouth in the morning.   metFORMIN XR (Glucophage-XR) 500 mg 24 hr tablet, Take 500 mg by mouth twice daily.   metoprolol succinate XL (Toprol-XL) 50 mg 24  hr tablet, TAKE ONE TABLET BY MOUTH ONCE DAILY   mirtazapine (Remeron) 15 mg tablet, Take 15 mg by mouth at bedtime.   OLANZapine (ZyPREXA) 10 mg tablet, Take 10 mg by mouth at bedtime.   senna 8.6 mg tablet, Take 2 tablets by mouth twice daily.   sodium zirconium cyclosilicate (Lokelma) 10 gram packet, Take 10 g by mouth once daily. TAKE 5 GRAMS BY MOUTH EVERY OTHER DAY   Stool Softener 100 mg capsule, Take 100 mg by mouth twice daily.   tamsulosin (Flomax) 0.4 mg 24 hr capsule, Take 0.4 mg by mouth at bedtime.    PRN   albuterol 90  mcg/actuation inhaler, Inhale 2 puffs every 6 (six) hours if needed for wheezing.   benzonatate (Tessalon) 100 mg capsule, Take 200 mg by mouth every 8 (eight) hours if needed.   bisacodyl (Dulcolax) 5 mg EC tablet, Take 10 mg by mouth if needed each day.    No Frequency   ammonium lactate (Lac-Hydrin) 12 % lotion, SMARTSIG:Application Topical   diclofenac (Voltaren) 1 % topical gel, Apply topically.   Enulose 10 gram/15 mL solution, SMARTSIG:15 Milliliter(s) By Mouth Every Morning   hydrocortisone 2.5 % cream, SMARTSIG:Topical   QUEtiapine (SEROqueL) 400 mg tablet, Take 400 mg by mouth.    Discontinued Medications:  Lisinopril (hyperkalemia and normotension)  --[]  discuss restarting with PCP and Nephrologist    Changed Medications:  Lokelma 10g daily (increased from 5g QOD)    New Medications:  Amoxicillin 875mg  BID for total 5 day course (last day 1/23)  Finasteride 5mg  daily    Imaging:  None    Test Results Pending At Discharge  None.    Pertinent Physical Exam At Time of Discharge  Physical Exam  Vitals reviewed.   Constitutional:       Appearance: Normal appearance.   HENT:      Head: Normocephalic and atraumatic.   Eyes:      General: No scleral icterus.     Extraocular Movements: Extraocular movements intact.   Cardiovascular:      Rate and Rhythm: Normal rate and regular rhythm.      Heart sounds: No murmur heard.  Pulmonary:      Effort: No respiratory distress.      Breath sounds: Normal breath sounds.   Abdominal:      General: There is no distension.      Palpations: Abdomen is soft.      Tenderness: There is no abdominal tenderness.      Comments: No suprapubic fullness   Musculoskeletal:      Right lower leg: No edema.      Left lower leg: No edema.   Skin:     General: Skin is warm and dry.   Neurological:      Mental Status: He is alert.         Issues Requiring Follow-Up  []  CONTINUE Amoxicillin 875mg  BID for total 5 day course (last day 1/23)  []  f/u with Urology outpatient 08/05/23 for BPH  []   Reached out to outpatient Nephrologist to set up follow-up in next few weeks  --[]  consider repeat labs and starting lisinopril outpatient  --[]  consider initiation of SGLT-2  []  CONTINUE Lokelma 10g daily  []  f/u with PCP for post-hospitalization appointment (Appt 1/27)                              Outpatient Follow-Up  Future Appointments  Date Time Provider Department Center   08/05/2023  1:30 PM Anise Salvo, MD [facility]   11/23/2023  1:30 PM Ambrose Mantle, PA [facility]

## 2023-06-01 NOTE — Other (Signed)
[place] MEDICINE POST ACUTE SERVICES REFERRAL FORM  Demographics       Address   [address] [place] [address]    Phone Numbers   Hm: [phone] Cell: [phone] Wk: [phone]    Marital Status   Single    Insurance Information   [employer]    Religion   Buddhist (Unknown)             Allergies (Reviewed on: 05/28/23)       Agent Severity Comments    Divalproex High Other reaction(s): 40 lb wt gain  DEPAKOTE 40 lb wt gain--    Lithium            Health Care Agents    There are no Health Care Agents on file.       Preferred Language       Preferred Language  [place] Preferred Written Language  English          Advanced Directive Assessment      Flowsheet Row Most Recent Value   Advance Directive Patient does not have advance directive Filed at 05/29/2023 0030             Medication List        START taking these medications      amoxicillin 875 mg tablet  Commonly known as: Amoxil  Take 1 tablet (875 mg) by mouth twice daily for 5 doses.  Last time this was given: Ask your nurse or doctor     finasteride 5 mg tablet  Commonly known as: Proscar  Take 1 tablet (5 mg) by mouth once daily. Do not crush, chew, or split.  Last time this was given: June 01, 2023  8:02 AM            CHANGE how you take these medications      sodium zirconium cyclosilicate 10 gram packet  Commonly known as: Lokelma  Take 10 g by mouth once daily. TAKE 5 GRAMS BY MOUTH EVERY OTHER DAY  Last time this was given: June 01, 2023  8:02 AM  What changed:   medication strength  how much to take  how to take this  when to take this            CONTINUE taking these medications      albuterol 90 mcg/actuation inhaler  Inhale 2 puffs every 6 (six) hours if needed for wheezing.     ammonium lactate 12 % lotion  Commonly known as: Lac-Hydrin  SMARTSIG:Application Topical     aspirin 81 mg EC tablet  Take 81 mg by mouth in the morning.  Last time this was given: June 01, 2023  8:02 AM     atorvastatin 80 mg  tablet  Commonly known as: Lipitor  Take 80 mg by mouth at bedtime.  Last time this was given: May 31, 2023  9:36 PM     benzonatate 100 mg capsule  Commonly known as: Tessalon  Take 200 mg by mouth every 8 (eight) hours if needed.     bisacodyl 5 mg EC tablet  Commonly known as: Dulcolax  Take 10 mg by mouth if needed each day.     brimonidine 0.15 % ophthalmic solution  Commonly known as: AlphaGAN P  Administer 1 drop into affected eye(s) three times daily.     carBAMazepine 200 mg tablet  Commonly known as: TEGretol  Take 200 mg by mouth twice daily.  Last time this was given: June 01, 2023  8:01 AM     diclofenac 1 % topical gel  Commonly known as: Voltaren  Apply topically.     dorzolamide 2 % ophthalmic solution  Commonly known as: Trusopt  Administer 1 drop into affected eye(s) twice daily.     Enulose 10 gram/15 mL solution  SMARTSIG:15 Milliliter(s) By Mouth Every Morning  Last time this was given: Ask your nurse or doctor  Generic drug: lactulose     furosemide 40 mg tablet  Commonly known as: Lasix  Take 40 mg by mouth in the morning.  Last time this was given: June 01, 2023  8:02 AM     hydrocortisone 2.5 % cream  SMARTSIG:Topical     metFORMIN XR 500 mg 24 hr tablet  Commonly known as: Glucophage-XR  Take 500 mg by mouth twice daily.     metoprolol succinate XL 50 mg 24 hr tablet  Commonly known as: Toprol-XL  TAKE ONE TABLET BY MOUTH ONCE DAILY  Last time this was given: June 01, 2023  8:55 AM     mirtazapine 15 mg tablet  Commonly known as: Remeron  Take 15 mg by mouth at bedtime.  Last time this was given: May 31, 2023  9:36 PM     OLANZapine 10 mg tablet  Commonly known as: ZyPREXA  Take 10 mg by mouth at bedtime.  Last time this was given: May 31, 2023  9:35 PM     senna 8.6 mg tablet  Take 2 tablets by mouth twice daily.  Last time this was given: May 31, 2023  9:36 PM  Generic drug: sennosides     SeroqueL 400 mg tablet  Take 400 mg by mouth.  Last time this was given:  May 31, 2023  9:35 PM  Generic drug: QUEtiapine     Stool Softener 100 mg capsule  Take 100 mg by mouth twice daily.  Generic drug: docusate sodium     tamsulosin 0.4 mg 24 hr capsule  Commonly known as: Flomax  Take 0.4 mg by mouth at bedtime.  Last time this was given: May 31, 2023  9:36 PM     Trelegy Ellipta 100-62.5-25 mcg blister with device  Inhale 1 Inhalation in the morning.  Generic drug: fluticasone-umeclidin-vilanter            STOP taking these medications      lisinopril 10 mg tablet                    Discharge Referral Orders (From admission, onward)       Start     Ordered    05/31/23 0000  Ambulatory referral to Urology        Question Answer Comment   My clinical question is: recent admission for urinary retention with hx BPH. ?further management    Referral Order Priority Routine        05/31/23 1234                  Nursing Post-Acute/Referral Information (Page 2)      Flowsheet Row Most Recent Value   Self Care Activities Functional Level    Dressing Independent   Grooming Independent   Feeding Independent   Bathing Independent   Toileting Independent   Bowel and Bladder    Last BM Date 05/28/23   Height and Weight    Height 1.575 m   Height Method Stated   Weight 63.5 kg   Weight Method Stated  After Discharge Information       None          General Therapy Patient Information (Page 3)      Flowsheet Row Most Recent Value   Cognition    Orientation Level Oriented X4 Filed at 06/01/2023 9604               Discharge Summary        Discharge Summary by Garnette Scheuermann, MD at 06/01/2023 10:50 AM  Version 1 of 1      Author: Garnette Scheuermann, MD Service: Internal Medicine Author Type: Resident    Filed: 06/01/2023 10:59 AM Date of Service: 06/01/2023 10:50 AM Status: Attested    Editor: Garnette Scheuermann, MD (Resident) Cosigner: Lilia Pro, MD at 06/01/2023 11:54 AM      Attestation signed by Lilia Pro, MD at 06/01/2023 11:54 AM    I saw and evaluated the patient, participating  in the key portions of the service.  I reviewed the resident's/fellow's note.  I agree with the resident's/fellow's findings and plan.     Overall feeling much better today. 1+ bilateral lower extremity edema.  Lungs are clear. He notes no difficulty urinating.    His WBC count is up marginally today, and this will need to be reassessed at follow-up in 6 days along with a basic metabolic panel. AKI and hyperkalemia are improved. Will hold the ACEi for now and needs reassessment by nephrology as an outpt.     Will continue amoxicillin through 06/03/23.                      Discharge Summary    Discharge Diagnosis  Acute urinary retention    Hospital Course   Patient Name: Arthur Young  Date of Birth: [birth year]  MRN: [id number]  Discharge Provider: Lilia Pro, MD  Primary Care Physician: Etter Sjogren, MD     Admission Date: 05/28/2023 - 06/01/23       Sumter Gozman is a 58 y.o. year old male with history of BPH, CKD, HFrEF, HTN, pHTN, COPD, T2DM, seizure disorder, and schizoaffective disorder.    Discharge Disposition  The patient was discharged back to [facility].    Brief HPI:  Patient was unable to urinate/ poor stream morning of admission. He endorsed dysuria, urgency and frequency. According to the patient he had urinary retention that required a foley be placed once before. He also endorses constipation. He denies fever, chills, sick contacts, chest pain, SOB, N/V/D.     Hospital Course By Problem:  #Urinary retention  #BPH  #E. faecalis UTI  Patient presented with poor stream and inability to urinate since morning of admission. Had foley placed with yellow urine draining. Patient was started on CTX 1/17 for UTI concern given dysuria and urgency. He was transitioned to cefpodoxime 200mg  BID on 1/18. Urine culture growing E faecalis sensitive to ampicillin and he was transitioned to amoxicillin 500mg  q8hr on 1/19. He passed a voiding trial on 1/18 and did not need foley replaced. Was started on  finasteride on 1/19. Overall picture a combination of urinary retention and UTI iso BPH.  []  CONTINUE Amoxicillin 875mg  BID for total 5 day course (last day 1/23)  []  f/u with Urology outpatient 08/05/23    #Hyperkalemia  #CKD Stage 3b  Patient presented with K 5.8, normally on daily lokelma at home. Was given additional dose of lokelma 1/17 with improvement in K. Was continued on daily 10g lokelma (increased  from home 5g QOD).  []  Will reach out to outpatient Nephrologist for follow-up  --[]  consider repeat labs and starting lisinopril outpatient  --[]  consider initiation of SGLT-2  []  CONTINUE Lokelma 10g daily  []  f/u with PCP to discuss restarting lisinopril (Appt 1/27)    #HTN  Patient with hx of HTN on lisinopril outpatient. Presented with hyperkalemia and possible AKI, so lisinopril was held. BPs remained soft 100/70s during admission, so restarting lisinopril was deferred.  []  f/u with PCP to discuss restarting lisinopril (Appt 1/27)    Discharge Medication List:  Home Medications After Discharge    Scheduled   amoxicillin (Amoxil) 875 mg tablet, Take 1 tablet (875 mg) by mouth twice daily for 5 doses.   aspirin 81 mg EC tablet, Take 81 mg by mouth in the morning.   atorvastatin (Lipitor) 80 mg tablet, Take 80 mg by mouth at bedtime.   brimonidine (AlphaGAN P) 0.15 % ophthalmic solution, Administer 1 drop into affected eye(s) three times daily.   carBAMazepine (TEGretol) 200 mg tablet, Take 200 mg by mouth twice daily.   dorzolamide (Trusopt) 2 % ophthalmic solution, Administer 1 drop into affected eye(s) twice daily.   finasteride (Proscar) 5 mg tablet, Take 1 tablet (5 mg) by mouth once daily. Do not crush, chew, or split.   fluticasone-umeclidin-vilanter (Trelegy Ellipta) 100-62.5-25 mcg blister with device, Inhale 1 Inhalation in the morning.   furosemide (Lasix) 40 mg tablet, Take 40 mg by mouth in the morning.   metFORMIN XR (Glucophage-XR) 500 mg 24 hr tablet, Take 500 mg by mouth twice daily.    metoprolol succinate XL (Toprol-XL) 50 mg 24 hr tablet, TAKE ONE TABLET BY MOUTH ONCE DAILY   mirtazapine (Remeron) 15 mg tablet, Take 15 mg by mouth at bedtime.   OLANZapine (ZyPREXA) 10 mg tablet, Take 10 mg by mouth at bedtime.   senna 8.6 mg tablet, Take 2 tablets by mouth twice daily.   sodium zirconium cyclosilicate (Lokelma) 10 gram packet, Take 10 g by mouth once daily. TAKE 5 GRAMS BY MOUTH EVERY OTHER DAY   Stool Softener 100 mg capsule, Take 100 mg by mouth twice daily.   tamsulosin (Flomax) 0.4 mg 24 hr capsule, Take 0.4 mg by mouth at bedtime.    PRN   albuterol 90 mcg/actuation inhaler, Inhale 2 puffs every 6 (six) hours if needed for wheezing.   benzonatate (Tessalon) 100 mg capsule, Take 200 mg by mouth every 8 (eight) hours if needed.   bisacodyl (Dulcolax) 5 mg EC tablet, Take 10 mg by mouth if needed each day.    No Frequency   ammonium lactate (Lac-Hydrin) 12 % lotion, SMARTSIG:Application Topical   diclofenac (Voltaren) 1 % topical gel, Apply topically.   Enulose 10 gram/15 mL solution, SMARTSIG:15 Milliliter(s) By Mouth Every Morning   hydrocortisone 2.5 % cream, SMARTSIG:Topical   QUEtiapine (SEROqueL) 400 mg tablet, Take 400 mg by mouth.    Discontinued Medications:  Lisinopril (hyperkalemia and normotension)  --[]  discuss restarting with PCP and Nephrologist    Changed Medications:  Lokelma 10g daily (increased from 5g QOD)    New Medications:  Amoxicillin 875mg  BID for total 5 day course (last day 1/23)  Finasteride 5mg  daily    Imaging:  None    Test Results Pending At Discharge  None.    Pertinent Physical Exam At Time of Discharge  Physical Exam  Vitals reviewed.   Constitutional:       Appearance: Normal appearance.   HENT:  Head: Normocephalic and atraumatic.   Eyes:      General: No scleral icterus.     Extraocular Movements: Extraocular movements intact.   Cardiovascular:      Rate and Rhythm: Normal rate and regular rhythm.      Heart sounds: No murmur heard.  Pulmonary:       Effort: No respiratory distress.      Breath sounds: Normal breath sounds.   Abdominal:      General: There is no distension.      Palpations: Abdomen is soft.      Tenderness: There is no abdominal tenderness.      Comments: No suprapubic fullness   Musculoskeletal:      Right lower leg: No edema.      Left lower leg: No edema.   Skin:     General: Skin is warm and dry.   Neurological:      Mental Status: He is alert.         Issues Requiring Follow-Up  []  CONTINUE Amoxicillin 875mg  BID for total 5 day course (last day 1/23)  []  f/u with Urology outpatient 08/05/23 for BPH  []  Reached out to outpatient Nephrologist to set up follow-up in next few weeks  --[]  consider repeat labs and starting lisinopril outpatient  --[]  consider initiation of SGLT-2  []  CONTINUE Lokelma 10g daily  []  f/u with PCP for post-hospitalization appointment (Appt 1/27)                              Outpatient Follow-Up  Future Appointments   Date Time Provider Department Center   08/05/2023  1:30 PM [facility]   11/23/2023  1:30 PM Laticco Cato Mulligan, PA [facility]

## 2023-06-01 NOTE — Care Plan (Signed)
Goal Outcome Evaluation:  A&O 4x. Denies pain and discomfort. Denies CP/palps/LH/dizziness. No tele order. Denies SOB/DOE. LS clear however diminished in all feilds. Denies N/V/D/cramps. ABd soft/round/non tender/non distended. Present BS in all quadrents. PIV C/D/I. Patient ordered for D/C today. Call light and personal belonigings within reach. Safety is maitnianed.            Problem: Adult Inpatient Plan of Care  Goal: Plan of Care Review  Outcome: Ongoing, Not Progressing  Goal: Patient-Specific Goal (Individualized)  Outcome: Ongoing, Not Progressing  Goal: Absence of Hospital-Acquired Illness or Injury  Outcome: Ongoing, Not Progressing  Goal: Optimal Comfort and Wellbeing  Outcome: Ongoing, Not Progressing  Goal: Readiness for Transition of Care  Outcome: Ongoing, Not Progressing     Problem: Urinary Retention  Goal: Effective Urinary Elimination  Outcome: Ongoing, Not Progressing

## 2023-06-01 NOTE — Progress Notes (Signed)
06/01/23 1648   Post Discharge Planning    Type of Residence Group Home   Lives With  Other (Comment)   [facility]   Who is requesting discharge planning? Patient   Home or Post Acute Services Group Home   Type of  Home Treatment/Devices/Equipment Needed for Discharge   Not Applicable   Patient expects to be discharged to: Group Home   Does the patient need discharge transport arranged? Yes   Has discharge transport been arranged? Yes  ([facility])   What day is the transport expected? 06/01/23   What time is the transport expected? 1530     CM spoke [facility] staff T# [phone] / F# [phone]  DC summary faxed for review.   They accepted pt back but transport not available.     [facility] - [facility] set up transport w/ Insurance T# [phone], they picked up pt at ~ 3:30pm.

## 2023-06-01 NOTE — Telephone Encounter (Signed)
Pharmacy calling to verify directions on   sodium zirconium cyclosilicate (Lokelma) packet 10 g. Per pharmacy rx was written by Dr Luiz Iron     Please call pharmacy [phone]

## 2023-06-07 NOTE — Assessment & Plan Note (Signed)
Arthur Young is a 58 y.o.M w/ CKD G3aA3 presumed secondary to T2DM/HTN, also has HFrEF (BiV dysfunction with ejection fraction of 20-25% and severely reduced right ventricular function in 05/2019), COPD (not on home O2) actively smoking tobacco, pHTN, BPH w/ LUTS, and schizoaffective disorder.   Kidney function: Patient's sCr 2.21 with corresponding eGFRcr 33 ml/min/1.67m2, UACR overall dowtrending ~1 g/g. AKI on CKD present, in retrospect in the presence of BOO s/p foley placement during hospital admission after our visit. Serum creatinine since downtrending. 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 Will review at follow-up visit.   Electrolytes/Acid/Base: K WNLs with Lokelma; bicarb WNLs.   Volume management: Euvolemic with current lasix dose.   Mineral bone disease management: Ca, phos WNLs. PTH dowtrended/stable since 06/2020. Continue to monitor trend.   Anemia management: Mild anemia present, Hb 12.2. No indication for ESA.   Blood pressure: SBP is at goal <130, may be a little low. Admitted shortly after visit with ACEi being held.   CKD therapies: ACEi held for AKI. SGLT2i may be considered if/when eGFR stabilizes if urologic issues/UTIs are not recurrent. Patient is a candidate for GLP1-RA therapy, may be considered in the future.  Disease-specific therapies: DM at goal <7, HTN addressed above.   CVD risk reduction: Continue ASA, high-intensity statin for secondary prevention.   Return to clinic: Post-hospital f/u in 3 weeks

## 2023-06-08 ENCOUNTER — Ambulatory Visit
Admit: 2023-06-08 | Discharge: 2023-06-08 | Payer: PRIVATE HEALTH INSURANCE | Attending: Physician Assistant | Primary: Family Medicine

## 2023-06-08 DIAGNOSIS — N1831 Chronic kidney disease, stage 3a (Multi-HCC): Secondary | ICD-10-CM

## 2023-06-08 LAB — CBC W/DIFF
Baso Abs: 0.09 10*3/uL (ref 0.00–0.22)
Basos: 1 %
Eos Abs: 0.21 10*3/uL (ref 0.00–0.50)
Eos: 2.3 %
Hct: 41.5 % (ref 37.0–53.0)
Hgb: 12.7 g/dL — ABNORMAL LOW (ref 13.0–17.5)
Immature Grans Abs: 0.07 10*3/uL (ref 0.00–0.10)
Immature Granulocytes: 0.8 %
Lymphs Abs: 2.14 10*3/uL (ref 0.70–4.00)
Lymphs: 23.6 %
MCH: 30.2 pg (ref 26.0–34.0)
MCHC: 30.6 g/dL — ABNORMAL LOW (ref 31.0–37.0)
MCV: 98.8 fL (ref 80.0–100.0)
Monocytes: 9.9 %
MonocytesAbs: 0.9 10*3/uL — ABNORMAL HIGH (ref 0.38–0.83)
NRBC: 0 % (ref 0.0–0.0)
Neutrophils Abs: 5.67 10*3/uL (ref 1.50–7.95)
Neutrophils: 62.4 %
Platelets: 300 10*3/uL (ref 150–400)
RBC: 4.2 M/uL (ref 4.20–5.90)
RDW: 13.4 % (ref 11.5–14.5)
WBC: 9.1 10*3/uL (ref 4.0–11.0)

## 2023-06-08 LAB — POCT URINALYSIS DIPSTICK
Bilirubin, UA, POC: NEGATIVE
Blood, UA, POC: NEGATIVE
Glucose, UA, POC: NORMAL
Ketones, UA, POC: NEGATIVE
Leukocytes, UA, POC: NEGATIVE
Nitrite, UA, POC: NEGATIVE
Spec Gravity, UA, POC: <=1.005 (ref 1.001–1.035)
Urobilinogen, UA, POC: NORMAL
pH, UA, POC: 6.5 (ref 5.0–8.0)

## 2023-06-08 LAB — COMPREHENSIVE METABOLIC PANEL
ALT: 15 U/L (ref 0–55)
AST: 17 U/L (ref 6–42)
Albumin: 4 g/dL (ref 3.2–5.0)
Alk Phosphatase: 127 U/L (ref 30–130)
Anion Gap: 11 mmol/L (ref 3–14)
BUN: 18 mg/dL (ref 6–24)
Bili Total: <0.2 mg/dL — ABNORMAL LOW (ref 0.2–1.2)
Calcium: 8.7 mg/dL (ref 8.5–10.5)
Carbon Dioxide: 26 mmol/L (ref 20–32)
Chloride: 108 mmol/L (ref 98–110)
Creat: 1.8 mg/dL — ABNORMAL HIGH (ref 0.55–1.30)
Glucose: 94 mg/dL (ref 70–139)
Potassium: 5.1 mmol/L (ref 3.6–5.2)
Protein Total: 6.3 g/dL (ref 6.0–8.4)
Sodium: 145 mmol/L (ref 135–146)
eGFR: 43 mL/min/{1.73_m2} — ABNORMAL LOW (ref >=60)

## 2023-06-08 NOTE — Progress Notes (Signed)
Primary Care Physician: Etter Sjogren, MD     PATIENT IDENTIFIER: Arthur Young is a 58 y.o. male with CKD stage g3a/a3 and a past medical history of HFrEF (BiV dysfunction with ejection fraction of 20-25% and severely reduced right ventricular function in 05/2019), COPD (not on home O2), pulmonary nodules, hypertension, pulmonary hypertension, benign prostatic hypertrophy with lower urinary tract symptoms, type 2 diabetes mellitus, and schizoaffective disorder.  He presented as a new patient in the renal clinic on 08/20/20 for discharge follow-up of ATN. He was admitted from 07/04/20-07/16/20 for acute respiratory failure secondary to heart failure and pneumonia. During that admission, he also developed AKI on CKD stage g3a with a peak creatinine of 4.14 mg/dL from his baseline of 1.4 mg/dL. Urinalysis at the time showed 3+ protein (191 spot protein, 123 spot albumin, Protein/Cr 3571 mg/g, Albumin/Cr 2307 mg/g) and thought his CKD is probably related to type 2 diabetes mellitus and hypertension. Nephrology was consulted during his admission for oliguria in the setting of his AKI, and his acute kidney dysfunction was thought to be pre-renal in the setting of cardiorenal syndrome and he developed post-ATN diuresis. He was given Diamox x1 for alkalosis and bicarbonate improved. His kidney function improved and his creatinine on discharge was 2.02 mg/dL (eGFR 38); labs were stable at this level on 07/28/21.    Subjective    INTERVAL HISTORY:   [facility] as a new patient on 11/28/2022.      Since his last visit, Aydon Swamy reports:   - He was recently hospitalized from 05/28/23 - 06/01/23 for poor urinary stream and inability to urinate. He had a Foley catheter placed, and was treated with ceftriaxone for suspected UTI, later transitioned to cefpodoxime. He was finally placed on amoxicillin, as his urine culture grew E. faecalis sensitive to ampicillin. He passed a voiding  trial while admitted, and the Foley catheter did not have to be placed again.   - He has been feeling well since discharge; denies hematuria, but urinary stream is slow and he still has hesitancy.   - Appetite is good; no nausea or vomiting, diarrhea, constipation.   - No chest pain or dyspnea.   - He states that he does not want to see Hematology next week.   - The patient reports that he is otherwise well and denies additional medical complaint at this time.    Patient Active Problem List   Diagnosis    AKI (acute kidney injury)    Benign prostatic hyperplasia with urinary obstruction    Bronchiectasis without complication (Multi-HCC)    Chronic bronchitis (Multi-HCC)    Chronic constipation    Chronic HFrEF (heart failure with reduced ejection fraction) (Multi-HCC)    Chronic venous stasis dermatitis of both lower extremities    Cigarette nicotine dependence without complication    CKD stage G3b/A3, GFR 30-44 and albumin creatinine ratio >300 mg/g (Multi-HCC)    Dysarthria    Enlarged prostate    Gammopathy, monoclonal    Hyperkalemia    Hypermetropia of both eyes    Hypertriglyceridemia    Macrocytosis without anemia    Male erectile disorder    Multiple lung nodules on CT    Paranoid schizophrenia (Multi-HCC)    Polycythemia, secondary    Essential (primary) hypertension    Primary open-angle glaucoma    Pulmonary hypertension (Multi-HCC)    Rectal adenocarcinoma (Multi-HCC)    Schizoaffective disorder (Multi-HCC)    Severe  chronic obstructive pulmonary disease (Multi-HCC)    Type 2 diabetes mellitus with diabetic nephropathy, without long-term current use of insulin (Multi-HCC)    Urinary retention    Vitamin D deficiency    Seizure disorder (Multi-HCC)    Acute urinary retention     Current Outpatient Medications   Medication Instructions    albuterol 90 mcg/actuation inhaler 2 puffs, inhalation, Every 6 hours PRN    ammonium lactate (Lac-Hydrin) 12 % lotion SMARTSIG:Application Topical    aspirin 81 mg,  oral, Every morning    atorvastatin (LIPITOR) 80 mg, oral, Nightly    benzonatate (TESSALON) 200 mg, oral, Every 8 hours PRN    brimonidine (AlphaGAN P) 0.15 % ophthalmic solution 1 drop, ophthalmic (eye), 3 times daily    carBAMazepine (TEGRETOL) 200 mg, oral, 2 times daily    diclofenac (Voltaren) 1 % topical gel topical (top)    dorzolamide (Trusopt) 2 % ophthalmic solution 1 drop, ophthalmic (eye), 2 times daily    Enulose 10 gram/15 mL solution SMARTSIG:15 Milliliter(s) By Mouth Every Morning    finasteride (PROSCAR) 5 mg, oral, Daily, Do not crush, chew, or split.    fluticasone-umeclidin-vilanter (Trelegy Ellipta) 100-62.5-25 mcg blister with device 1 Inhalation, inhalation, Daily RT    furosemide (LASIX) 40 mg, oral, Every morning    metFORMIN XR (GLUCOPHAGE-XR) 500 mg, oral, 2 times daily    metoprolol succinate XL (Toprol-XL) 50 mg 24 hr tablet TAKE ONE TABLET BY MOUTH ONCE DAILY    mirtazapine (REMERON) 15 mg, oral, Nightly    OLANZapine (ZYPREXA) 10 mg, oral, Nightly    QUEtiapine (SEROQUEL) 400 mg, oral    senna 8.6 mg tablet 2 tablets, oral, 2 times daily    sodium zirconium cyclosilicate (LOKELMA) 10 g, oral, Daily, TAKE 5 GRAMS BY MOUTH EVERY OTHER DAY    Stool Softener 100 mg, oral, 2 times daily    tamsulosin (FLOMAX) 0.4 mg, oral, Nightly     Allergies   Allergen Reactions    Divalproex      Other reaction(s): 40 lb wt gain  DEPAKOTE 40 lb wt gain--    Lithium      Past Medical History:   Diagnosis Date    Hypertension      No past surgical history on file.  No family history on file.  Social History     Tobacco Use    Smoking status: Every Day     Current packs/day: 1.00     Average packs/day: 1 pack/day for 35.0 years (35.0 ttl pk-yrs)     Types: Cigarettes    Smokeless tobacco: Never   Substance Use Topics    Alcohol use: Never    Drug use: Never       Objective    PHYSICAL EXAM:   Vitals:    06/08/23 1525   BP: 105/64   Pulse: 101   Temp: 37 C (98.6 F)   TempSrc: Temporal   Weight: 62.6 kg    Height: 1.588 m            Physical Exam  Vitals reviewed.   Constitutional:       General: He is not in acute distress.     Appearance: He is not ill-appearing.   HENT:      Mouth/Throat:      Comments: Poor dentition.  Cardiovascular:      Rate and Rhythm: Normal rate and regular rhythm.      Pulses: Normal pulses.  Heart sounds: Normal heart sounds.   Pulmonary:      Effort: Pulmonary effort is normal. No respiratory distress.      Breath sounds: Normal breath sounds.   Musculoskeletal:      Comments: 1+ bilateral lower extremity edema with hyperpigmentation circumferentially of the tibia.    Skin:     General: Skin is warm and dry.   Neurological:      Mental Status: He is alert and oriented to person, place, and time.       LABORATORY DATA:    Labs Ordered Recently:   Office Visit on 06/08/2023   Component Date Value    Glucose 06/08/2023 94     BUN 06/08/2023 18     Creat 06/08/2023 1.80 (H)     eGFR 06/08/2023 43 (L)     Sodium 06/08/2023 145     Potassium 06/08/2023 5.1     Chloride 06/08/2023 108     Anion Gap 06/08/2023 11     Carbon Dioxide 06/08/2023 26     Calcium 06/08/2023 8.7     Protein Total 06/08/2023 6.3     Albumin 06/08/2023 4.0     Bili Total 06/08/2023 <0.2 (L)     Alk Phosphatase 06/08/2023 127     AST 06/08/2023 17     ALT 06/08/2023 15     Magnesium 06/08/2023 2.0     Phosphorus 06/08/2023 3.9     Albumin Ur 06/08/2023 524.3     Alb/Creat Ratio 06/08/2023 1,355 (H)     Creat Ur 06/08/2023 38.7     Protein Total Ur 06/08/2023 85.3     Protein/Creat Ratio 06/08/2023 2,204 (H)     Spec Gravity, UA, POC 06/08/2023 <=1.005     pH, UA, POC 06/08/2023 6.5     Leukocytes, UA, POC 06/08/2023 Negative     Nitrite, UA, POC 06/08/2023 Negative     Protein, UA, POC 06/08/2023 1+ (A)     Glucose, UA, POC 06/08/2023 Normal     Ketones, UA, POC 06/08/2023 Negative     Urobilinogen, UA, POC 06/08/2023 Normal     Bilirubin, UA, POC 06/08/2023 Negative     Blood, UA, POC 06/08/2023 Negative     WBC  06/08/2023 9.1     RBC 06/08/2023 4.20     Hgb 06/08/2023 12.7 (L)     Hct 06/08/2023 41.5     MCV 06/08/2023 98.8     MCH 06/08/2023 30.2     MCHC 06/08/2023 30.6 (L)     RDW 06/08/2023 13.4     Platelets 06/08/2023 300     Neutrophils 06/08/2023 62.4     Lymphs 06/08/2023 23.6     Monocytes 06/08/2023 9.9     Eos 06/08/2023 2.3     Basos 06/08/2023 1.0     Neutrophils Abs 06/08/2023 5.67     Lymphs Abs 06/08/2023 2.14     MonocytesAbs 06/08/2023 0.90 (H)     Eos Abs 06/08/2023 0.21     Baso Abs 06/08/2023 0.09     Immature Granulocytes 06/08/2023 0.8     Immature Grans Abs 06/08/2023 0.07     NRBC 06/08/2023 0.0         The ASCVD Risk score (Arnett DK, et al., 2019) failed to calculate for the following reasons:    The patient has a prior MI or stroke diagnosis    Assessment/Plan    CKD stage G3b/A3, GFR 30-44 and albumin creatinine ratio >300 mg/g (Multi-HCC)  Historically  CKD stage g3a/a3 and a past medical history of HFrEF (BiV dysfunction with ejection fraction of 20-25% and severely reduced right ventricular function in 05/2019), COPD (not on home O2), pulmonary nodules, hypertension, pulmonary hypertension, benign prostatic hypertrophy with lower urinary tract symptoms, type 2 diabetes mellitus, and schizoaffective disorder.  He presented as a new patient in the renal clinic on 08/20/20 for discharge follow-up of ATN. He was admitted from 07/04/20-07/16/20 for acute respiratory failure secondary to heart failure and pneumonia. During that admission, he also developed AKI on CKD stage g3a with a peak creatinine of 4.14 mg/dL from his baseline of 1.4 mg/dL. Urinalysis at the time showed 3+ protein (191 spot protein, 123 spot albumin, Protein/Cr 3571 mg/g, Albumin/Cr 2307 mg/g) and thought his CKD is probably related to type 2 diabetes mellitus and hypertension. Nephrology was consulted during his admission for oliguria in the setting of his AKI, and his acute kidney dysfunction was thought to be pre-renal in the  setting of cardiorenal syndrome and he developed post-ATN diuresis. He was given Diamox x1 for alkalosis and bicarbonate improved. His kidney function improved and his creatinine on discharge was 2.02 mg/dL (eGFR 38); labs were stable at this level on 07/28/21.    eGFR-creatinine is improved from previous check at 43 ml/min (39 ml/min when last seen), serum creatinine 1.9 mg/dl, and improved urine albumin/creatinine ratio of 1,355 mg/g (1,514 mg/g at the last visit). eGFR-cystatin/creatinine was lower at 35 ml/min when last checked. There is mild anemia; no acidosis or electrolyte abnormality. Calcium and phosphorus are normal.     Gammopathy, monoclonal  He has a new patient appointment with Hematology for monoclonal gammopathy and paraproteinemia on 06/17/23, but he states that he does not wish to keep the appointment because he "feels fine" and is "tired of having so many appointments". We encouraged him to keep the appointment, however.     Essential (primary) hypertension  Blood pressure is controlled without symptoms such as headache, dizziness, lightheadedness, or pre-syncope.    Benign prostatic hyperplasia with urinary obstruction  He was recently hospitalized from 05/28/23 - 06/01/23 for poor urinary stream and inability to urinate. He had a Foley catheter placed, and was treated with ceftriaxone for suspected UTI, later transitioned to cefpodoxime. He was finally placed on amoxicillin, as his urine culture grew E. faecalis sensitive to ampicillin. He passed a voiding trial while admitted, and the Foley catheter did not have to be placed again.

## 2023-06-09 LAB — PROTEIN / CREATININE RATIO, URINE
Creat Ur: 38.7 mg/dL
Protein Total Ur: 85.3 mg/dL
Protein/Creat Ratio: 2204 mg/g{creat} — ABNORMAL HIGH (ref 0–200)

## 2023-06-09 LAB — MAGNESIUM: Magnesium: 2 mg/dL (ref 1.6–2.3)

## 2023-06-09 LAB — PHOSPHORUS: Phosphorus: 3.9 mg/dL (ref 2.8–4.1)

## 2023-06-09 LAB — ALBUMIN, URINE, RANDOM (INCLUDING CREATININE)
Alb/Creat Ratio: 1355 mg/g{creat} — ABNORMAL HIGH (ref 0–29)
Albumin Ur: 524.3 ug/mL

## 2023-06-17 ENCOUNTER — Ambulatory Visit: Payer: PRIVATE HEALTH INSURANCE | Primary: Family Medicine

## 2023-06-17 ENCOUNTER — Ambulatory Visit: Payer: PRIVATE HEALTH INSURANCE | Attending: Internal Medicine | Primary: Family Medicine

## 2023-06-18 NOTE — Assessment & Plan Note (Signed)
He was recently hospitalized from 05/28/23 - 06/01/23 for poor urinary stream and inability to urinate. He had a Foley catheter placed, and was treated with ceftriaxone for suspected UTI, later transitioned to cefpodoxime. He was finally placed on amoxicillin, as his urine culture grew E. faecalis sensitive to ampicillin. He passed a voiding trial while admitted, and the Foley catheter did not have to be placed again.

## 2023-06-18 NOTE — Assessment & Plan Note (Signed)
Blood pressure is controlled without symptoms such as headache, dizziness, lightheadedness, or pre-syncope.

## 2023-06-18 NOTE — Assessment & Plan Note (Signed)
Historically CKD stage g3a/a3 and a past medical history of HFrEF (BiV dysfunction with ejection fraction of 20-25% and severely reduced right ventricular function in 05/2019), COPD (not on home O2), pulmonary nodules, hypertension, pulmonary hypertension, benign prostatic hypertrophy with lower urinary tract symptoms, type 2 diabetes mellitus, and schizoaffective disorder.  He presented as a new patient in the renal clinic on 08/20/20 for discharge follow-up of ATN. He was admitted from 07/04/20-07/16/20 for acute respiratory failure secondary to heart failure and pneumonia. During that admission, he also developed AKI on CKD stage g3a with a peak creatinine of 4.14 mg/dL from his baseline of 1.4 mg/dL. Urinalysis at the time showed 3+ protein (191 spot protein, 123 spot albumin, Protein/Cr 3571 mg/g, Albumin/Cr 2307 mg/g) and thought his CKD is probably related to type 2 diabetes mellitus and hypertension. Nephrology was consulted during his admission for oliguria in the setting of his AKI, and his acute kidney dysfunction was thought to be pre-renal in the setting of cardiorenal syndrome and he developed post-ATN diuresis. He was given Diamox x1 for alkalosis and bicarbonate improved. His kidney function improved and his creatinine on discharge was 2.02 mg/dL (eGFR 38); labs were stable at this level on 07/28/21.    eGFR-creatinine is improved from previous check at 43 ml/min (39 ml/min when last seen), serum creatinine 1.9 mg/dl, and improved urine albumin/creatinine ratio of 1,355 mg/g (1,514 mg/g at the last visit). eGFR-cystatin/creatinine was lower at 35 ml/min when last checked. There is mild anemia; no acidosis or electrolyte abnormality. Calcium and phosphorus are normal.

## 2023-06-18 NOTE — Assessment & Plan Note (Signed)
He has a new patient appointment with Hematology for monoclonal gammopathy and paraproteinemia on 06/17/23, but he states that he does not wish to keep the appointment because he "feels fine" and is "tired of having so many appointments". We encouraged him to keep the appointment, however.

## 2023-06-29 ENCOUNTER — Ambulatory Visit: Payer: PRIVATE HEALTH INSURANCE | Attending: Physician Assistant | Primary: Family Medicine

## 2023-07-22 ENCOUNTER — Ambulatory Visit
Admit: 2023-07-22 | Discharge: 2023-07-22 | Payer: PRIVATE HEALTH INSURANCE | Attending: Internal Medicine | Primary: Family Medicine

## 2023-07-22 ENCOUNTER — Other Ambulatory Visit: Admit: 2023-07-22 | Payer: PRIVATE HEALTH INSURANCE | Primary: Family Medicine

## 2023-07-22 DIAGNOSIS — D472 Monoclonal gammopathy: Secondary | ICD-10-CM

## 2023-07-22 LAB — CBC WITH DIFFERENTIAL
Basophils %: 1.3 %
Basophils Absolute: 0.11 10*3/uL (ref 0.00–0.22)
Eosinophils %: 2.7 %
Eosinophils Absolute: 0.23 10*3/uL (ref 0.00–0.50)
Hematocrit: 43.6 % (ref 37.0–53.0)
Hemoglobin: 13.9 g/dL (ref 13.0–17.5)
Immature Granulocytes %: 0.8 %
Immature Granulocytes Absolute: 0.07 10*3/uL (ref 0.00–0.10)
Lymphocyte %: 17.1 %
Lymphocytes Absolute: 1.47 10*3/uL (ref 0.70–4.00)
MCH: 31.2 pg (ref 26.0–34.0)
MCHC: 31.9 g/dL (ref 31.0–37.0)
MCV: 97.8 fL (ref 80.0–100.0)
MPV: 9.1 fL (ref 9.1–12.4)
Monocytes %: 6.8 %
Monocytes Absolute: 0.58 10*3/uL (ref 0.38–0.83)
NRBC %: 0 % (ref 0.0–0.0)
NRBC Absolute: 0 10*3/uL (ref 0.00–2.00)
Neutrophil %: 71.3 %
Neutrophils Absolute: 6.12 10*3/uL (ref 1.50–7.95)
Platelets: 247 10*3/uL (ref 150–400)
RBC: 4.46 M/uL (ref 4.20–5.90)
RDW-CV: 13.6 % (ref 11.5–14.5)
RDW-SD: 49.2 fL (ref 35.0–51.0)
WBC: 8.6 10*3/uL (ref 4.0–11.0)

## 2023-07-22 LAB — COMPREHENSIVE METABOLIC PANEL
ALT: 14 U/L (ref 0–55)
AST: 17 U/L (ref 6–42)
Albumin: 3.9 g/dL (ref 3.2–5.0)
Alkaline phosphatase: 125 U/L (ref 30–130)
Anion Gap: 11 mmol/L (ref 3–14)
BUN: 23 mg/dL (ref 6–24)
Bilirubin, total: <0.2 mg/dL — ABNORMAL LOW (ref 0.2–1.2)
CO2 (Bicarbonate): 24 mmol/L (ref 20–32)
Calcium: 8.5 mg/dL (ref 8.5–10.5)
Chloride: 103 mmol/L (ref 98–110)
Creatinine: 1.91 mg/dL — ABNORMAL HIGH (ref 0.55–1.30)
Glucose: 136 mg/dL (ref 70–139)
Potassium: 4.7 mmol/L (ref 3.6–5.2)
Protein, total: 6.6 g/dL (ref 6.0–8.4)
Sodium: 138 mmol/L (ref 135–146)
eGFRcr: 40 mL/min/{1.73_m2} — ABNORMAL LOW (ref >=60)

## 2023-07-22 LAB — PROTEIN, TOTAL: Protein, total: 6.6 g/dL (ref 6.0–8.4)

## 2023-07-22 LAB — IGG, IGA, IGM
IgA: 473 mg/dL — ABNORMAL HIGH (ref 70–400)
IgG: 680 mg/dL — ABNORMAL LOW (ref 700–1600)
IgM: 40 mg/dL (ref 40–230)

## 2023-07-22 LAB — FREE LIGHT CHAINS (KAPPA, LAMBDA)
Free Kappa/Lambda ratio: 0.3 (ref 0.22–1.74)
Kappa Free Light Chains: 123 mg/L — ABNORMAL HIGH (ref 3.3–19.4)
Lambda Free Light Chains: 413 mg/L — ABNORMAL HIGH (ref 5.7–26.3)

## 2023-07-22 LAB — NT PRO BNP: NT-proBNP: 125 pg/mL (ref 0–125)

## 2023-07-22 LAB — CREATININE, SERUM
Creatinine: 1.91 mg/dL — ABNORMAL HIGH (ref 0.55–1.30)
eGFRcr: 40 mL/min/{1.73_m2} — ABNORMAL LOW (ref >=60)

## 2023-07-22 LAB — BNP: NT-proBNP: 137 pg/mL — ABNORMAL HIGH (ref 0–125)

## 2023-07-22 LAB — TROPONIN T, HIGH SENSITIVITY: Troponin T, High Sensitivity: 16 ng/L — ABNORMAL HIGH (ref <12)

## 2023-07-22 LAB — ALBUMIN: Albumin: 3.9 g/dL (ref 3.2–5.0)

## 2023-07-22 LAB — RAINBOW DRAW LT. GREEN PST TOP

## 2023-07-22 NOTE — Patient Instructions (Signed)
 CLINICAL SUMMARY    CONTACT INFORMATION:   Dr. Alfredia Ferguson       [phone] (option 4)    Administrative Assistant      [phone]  For Appointments:      [phone]    Kathi Der, NP          Nurse Clinic Line      [phone] (Monday-Friday 8am-5pm)    Social Work Services      [phone]  Infusion Center      [phone]  [facility]      [phone]  [facility] Inpatient Unit:     [phone]    After hours, please contact the on-call Heme/Onc Fellow: [phone]    (After 5 P.M. Monday-through Friday, nights, weekends and holidays)    WHEN TO CONTACT YOUR DOCTOR OR HEALTH CARE PROVIDER:  Contact your health care provider immediately, day or night, if you should experience any of the following symptoms:   Fever of 100.4 F (38 C) or higher, chills (possible signs of infection)   The following symptoms require medical attention, but are not an emergency. Contact your health care provider within 24 hours of noticing any of the following:   Nausea (interferes with ability to eat and unrelieved with prescribed medication)   Vomiting (vomiting more than 4-5 times in a 24 hour period)   Diarrhea (4-6 episodes in a 24-hour period) despite anti-diarrhea medication and diet alterations.   Unusual bleeding or bruising   Black or tarry stools, or blood in your stools   Blood in the urine   Extreme fatigue (unable to carry on self-care activities)   Mouth sores (painful redness, swelling or ulcers)   Tingling or burning, new redness, swelling or rash     Please take all of your medications as prescribed.   Please bring updated medication list at your next visit.    If you are not feeling well, you need to let us know.  We are here to help.

## 2023-07-22 NOTE — Progress Notes (Signed)
 New Patient Visit  Introduced self to the New Patient and staff member from his group home. I provided patient with the New Patient Packet, including all contact information.  Patient stated he does not need any information.    Information given to staff member along with supplies and instructions for a 24 hour urine.  Staff member verbalized understanding.

## 2023-07-22 NOTE — Progress Notes (Signed)
 New Patient Visit  [facility]    Patient ID: Arthur Young  MRN: [id number]  DOB: Jul 13, 1965  Referring Physician: Ambrose Mantle, PA   Primary Care Provider: Etter Sjogren, MD    Diagnosis: IgA Lambda monoclonal gammopathy       History of Present Illness:  Arthur Young is a 58 y.o. 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     Patient has been following with nephrology since 08/2020 after he had an admission for acute respiratory failure secondary to heart failure pneumonia with AKI on CKD. Patient's renal function noted to be a little worse 05/2023 and had additional work up including SPEP/IFE on 05/25/23 which showed m-spike of 0.2 with a IgA lambda monoclonal protein (IgA 472, FLC lambda 175.6) as well UPEP with M-spike of 3.7. He was admitted shortly after on 1/17-21/25 for acute urinary retention in the setting of E. Faecalis UTI and BPH and discharged with a course of antibiotics.     Interval History:  Patient presents today with a staff member from his group home. [place] interpreter [phone] Arthur Young) assisted with discussion. Patient feels well without any concerns today. He denies fatigue, fevers, night sweats, weight loss, chest pain, dyspnea, abdominal pain, nausea, vomiting, diarrhea, urinary concerns, lower extremity edema, bony pains. He inquires about treatment for his condition but declines any invasive procedures.     Past Medical History  Past Medical History:   Diagnosis Date    Hypertension       Patient Active Problem List   Diagnosis    AKI (acute kidney injury)    Benign prostatic hyperplasia with urinary obstruction    Bronchiectasis without complication (Multi-HCC)    Chronic bronchitis (Multi-HCC)    Chronic constipation    Chronic HFrEF (heart failure with reduced ejection fraction) (Multi-HCC)    Chronic venous stasis dermatitis of both  lower extremities    Cigarette nicotine dependence without complication    CKD stage G3b/A3, GFR 30-44 and albumin creatinine ratio >300 mg/g (Multi-HCC)    Dysarthria    Enlarged prostate    Gammopathy, monoclonal    Hyperkalemia    Hypermetropia of both eyes    Hypertriglyceridemia    Macrocytosis without anemia    Male erectile disorder    Multiple lung nodules on CT    Paranoid schizophrenia (Multi-HCC)    Polycythemia, secondary    Essential (primary) hypertension    Primary open-angle glaucoma    Pulmonary hypertension (Multi-HCC)    Rectal adenocarcinoma (Multi-HCC)    Schizoaffective disorder (Multi-HCC)    Severe chronic obstructive pulmonary disease (Multi-HCC)    Type 2 diabetes mellitus with diabetic nephropathy, without long-term current use of insulin (Multi-HCC)    Urinary retention    Vitamin D deficiency    Seizure disorder (Multi-HCC)    Acute urinary retention        Past Surgical History  No past surgical history on file.     Family History  Family History   Problem Relation Name Age of Onset    Cancer Neg Hx          Social History  Social History     Tobacco Use    Smoking status: Every Day     Current packs/day: 1.00     Average packs/day: 1 pack/day for 35.0 years (35.0 ttl pk-yrs)     Types: Cigarettes  Smokeless tobacco: Never   Substance Use Topics    Alcohol use: Never     Social History     Social History Narrative    Not on file        Allergies  Allergies   Allergen Reactions    Divalproex      Other reaction(s): 40 lb wt gain  DEPAKOTE 40 lb wt gain--    Lithium        Medications  Current Outpatient Medications   Medication Instructions    albuterol 90 mcg/actuation inhaler 2 puffs, inhalation, Every 6 hours PRN    ammonium lactate (Lac-Hydrin) 12 % lotion SMARTSIG:Application Topical    aspirin 81 mg, oral, Every morning    atorvastatin (LIPITOR) 80 mg, oral, Nightly    benzonatate (TESSALON) 200 mg, oral, Every 8 hours PRN    brimonidine (AlphaGAN P) 0.15 % ophthalmic solution 1  drop, ophthalmic (eye), 3 times daily    carBAMazepine (TEGRETOL) 200 mg, oral, 2 times daily    diclofenac (Voltaren) 1 % topical gel topical (top)    dorzolamide (Trusopt) 2 % ophthalmic solution 1 drop, ophthalmic (eye), 2 times daily    Enulose 10 gram/15 mL solution SMARTSIG:15 Milliliter(s) By Mouth Every Morning    fluticasone-umeclidin-vilanter (Trelegy Ellipta) 100-62.5-25 mcg blister with device 1 Inhalation, inhalation, Daily RT    furosemide (LASIX) 40 mg, oral, Every morning    Lokelma 10 g, oral, Daily    metFORMIN XR (GLUCOPHAGE-XR) 500 mg, oral, 2 times daily    metoprolol succinate XL (Toprol-XL) 50 mg 24 hr tablet TAKE ONE TABLET BY MOUTH ONCE DAILY    mirtazapine (REMERON) 15 mg, oral, Nightly    OLANZapine (ZYPREXA) 10 mg, oral, Nightly    QUEtiapine (SEROQUEL) 400 mg, oral    senna 8.6 mg tablet 2 tablets, oral, 2 times daily    Stool Softener 100 mg, oral, 2 times daily    tamsulosin (FLOMAX) 0.4 mg, oral, Nightly        ROS  A complete 12-point review of systems was performed, with pertinent positives and negatives noted in the above HPI.    Physical Examination  Visit Vitals  BP 124/71 (BP Location: Right arm, Patient Position: Sitting, BP Cuff Size: Adult)   Pulse 91   Temp 36.2 C (97.2 F) (Temporal)   Resp 16      ECOG PS: (0) Fully active, able to carry on all predisease performance without restriction  General: Healthy 58 y.o. male appearing stated age in no acute distress  ENT: PERRLA, EOMI. No signs of mucositis or thrush  Respiratory: CTAB, normal work of breathing on room air  Cardiac: RRR, no m/r/g  Abdominal: Soft, NT/ND, bowel sounds present. No hepatosplenomeagly  Extremities: Warm, non-edematous  Neuro: Aox3, no focal deficits.  Skin: No rashes or ecchymoses     Labs  Recent Results (from the past 24 hour(s))   Light Green Top    Collection Time: 07/22/23 10:03 AM   Result Value Ref Range    Extra Tube Hold for add-ons.    Free Light Chains (kappa/lambda)    Collection Time:  07/22/23 10:07 AM   Result Value Ref Range    Kappa Free Light Chains 123.0 (H) 3.3 - 19.4 mg/L    Lambda Free Light Chains 413.0 (H) 5.7 - 26.3 mg/L    Free Kappa/Lambda ratio 0.30 0.22 - 1.74   IgG, IgA, IgM    Collection Time: 07/22/23 10:07 AM   Result Value Ref  Range    IgG 680 (L) 700 - 1,600 mg/dL    IgA 166 (H) 70 - 063 mg/dL    IgM 40 40 - 016 mg/dL   Comprehensive metabolic panel    Collection Time: 07/22/23 10:07 AM   Result Value Ref Range    Sodium 138 135 - 146 mmol/L    Potassium 4.7 3.6 - 5.2 mmol/L    Chloride 103 98 - 110 mmol/L    CO2 (Bicarbonate) 24 20 - 32 mmol/L    Anion Gap 11 3 - 14 mmol/L    BUN 23 6 - 24 mg/dL    Creatinine 0.10 (H) 0.55 - 1.30 mg/dL    eGFRcr 40 (L) >=93 AT/FTD/3.22G*2    Glucose 136 70 - 139 mg/dL    Fasting? Unknown     Calcium 8.5 8.5 - 10.5 mg/dL    AST 17 6 - 42 U/L    ALT 14 0 - 55 U/L    Alkaline phosphatase 125 30 - 130 U/L    Protein, total 6.6 6.0 - 8.4 g/dL    Albumin 3.9 3.2 - 5.0 g/dL    Bilirubin, total <5.4 (L) 0.2 - 1.2 mg/dL   N-Terminal Probnp    Collection Time: 07/22/23 10:07 AM   Result Value Ref Range    NT-proBNP 125 0 - 125 pg/mL   Troponin T, High Sensitivity    Collection Time: 07/22/23 10:07 AM   Result Value Ref Range    Troponin T, High Sensitivity 16 (H) <12 ng/L   B-type natriuretic peptide    Collection Time: 07/22/23 10:07 AM   Result Value Ref Range    NT-proBNP 137 (H) 0 - 125 pg/mL   CBC w/ Differential    Collection Time: 07/22/23 10:07 AM   Result Value Ref Range    WBC 8.6 4.0 - 11.0 K/uL    RBC 4.46 4.20 - 5.90 M/uL    Hemoglobin 13.9 13.0 - 17.5 g/dL    Hematocrit 27.0 62.3 - 53.0 %    MCV 97.8 80.0 - 100.0 fL    MCH 31.2 26.0 - 34.0 pg    MCHC 31.9 31.0 - 37.0 g/dL    RDW-CV 76.2 83.1 - 51.7 %    RDW-SD 49.2 35.0 - 51.0 fL    Platelets 247 150 - 400 K/uL    MPV 9.1 9.1 - 12.4 fL    Neutrophil % 71.3 %    Lymphocyte % 17.1 %    Monocytes % 6.8 %    Eosinophils % 2.7 %    Basophils % 1.3 %    Immature Granulocytes % 0.8 %    NRBC %  0.0 0.0 - 0.0 %    Neutrophils Absolute 6.12 1.50 - 7.95 K/uL    Lymphocytes Absolute 1.47 0.70 - 4.00 K/uL    Monocytes Absolute 0.58 0.38 - 0.83 K/uL    Eosinophils Absolute 0.23 0.00 - 0.50 K/uL    Basophils Absolute 0.11 0.00 - 0.22 K/uL    Immature Granulocytes Absolute 0.07 0.00 - 0.10 K/uL    NRBC Absolute 0.00 0.00 - 2.00 K/uL   Protein, total    Collection Time: 07/22/23 10:07 AM   Result Value Ref Range    Protein, total 6.6 6.0 - 8.4 g/dL   Albumin    Collection Time: 07/22/23 10:07 AM   Result Value Ref Range    Albumin 3.9 3.2 - 5.0 g/dL   Creatinine, serum    Collection Time: 07/22/23 10:07  AM   Result Value Ref Range    Creatinine 1.91 (H) 0.55 - 1.30 mg/dL    eGFRcr 40 (L) >=16 XW/RUE/4.54U*9        Imaging Reviewed:      Pathology Reviewed:      Assessment and Plan  Arthur Young is a 58 y.o. 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     Patient with known CKD in the setting of his HTN and T2DM and referred by his nephrology team for recent testing on 05/25/23 which showed m-spike of 0.2 on his SPEP with IgA lambda monoclonal gammopathy as well as m-spike of 3.7 on UPEP. Repeat SPEP/IFE pending but IgA levels elevated at 473 (stable from 05/2023 at 472) with increasing lambda FLC from 199 to 413. His cardiac markers including BNP and troponin are mildly elevated but patient remains asymptomatic. He had a mild anemia ~12 which has resolved today with hemoglobin at 13.9 and renal function is at baseline. Recommended additional testing including 24 hour urine to quantify the protein as well as bone marrow and fat pad biopsy for evaluation of multiple myeloma and amyloidosis. However, patient declines any invasive procedures as he states he feels well but would like treatment if available. Discussed that we would not be able to treat unless we are able to  obtain more information with biopsies but patient once again declined to pursue the tests. Discussed that patient's renal function may worsen requiring him to be on dialysis but patient states that he feels well now and does not want additional testing. He is agreeable to complete the urine studies and supplies were provided to group home staff member who assisted him to his visit today.   - continue follow up with nephrology  - will obtain 24 urine studies   - if patient changes his mind, can pursue bone marrow and fat pad biopsy       Return to Clinic: If agreeable to additional testing including bone marrow biopsy     Haze Rushing, MD  Hematology/Oncology Fellow  [facility]

## 2023-08-05 ENCOUNTER — Ambulatory Visit: Payer: PRIVATE HEALTH INSURANCE | Attending: Urology | Primary: Family Medicine

## 2023-09-07 ENCOUNTER — Ambulatory Visit: Payer: MEDICARE | Attending: Physician Assistant | Primary: Family Medicine

## 2023-09-07 DIAGNOSIS — N1832 Chronic kidney disease, stage 3b (CMS-HCC): Secondary | ICD-10-CM

## 2023-09-07 NOTE — Progress Notes (Unsigned)
 Primary Care Physician: Arthur Catchings, MD     PATIENT IDENTIFIER: Arthur Young is a 58 y.o. male with CKD stage g3a/a3 and a past medical history of HFrEF (BiV dysfunction with ejection fraction of 20-25% and severely reduced right ventricular function in 05/2019), COPD (not on home O2), pulmonary nodules, hypertension, pulmonary hypertension, benign prostatic hypertrophy with lower urinary tract symptoms, type 2 diabetes mellitus, and schizoaffective disorder.  He presented as a new patient in the renal clinic on 08/20/20 for discharge follow-up of ATN. He was admitted from 07/04/20-07/16/20 for acute respiratory failure secondary to heart failure and pneumonia. During that admission, he also developed AKI on CKD stage g3a with a peak creatinine of 4.14 mg/dL from his baseline of 1.4 mg/dL. Urinalysis at the time showed 3+ protein (191 spot protein, 123 spot albumin, Protein/Cr 3571 mg/g, Albumin/Cr 2307 mg/g) and thought his CKD is probably related to type 2 diabetes mellitus and hypertension. Nephrology was consulted during his admission for oliguria in the setting of his AKI, and his acute kidney dysfunction was thought to be pre-renal in the setting of cardiorenal syndrome and he developed post-ATN diuresis. He was given Diamox x1 for alkalosis and bicarbonate improved. His kidney function improved and his creatinine on discharge was 2.02 mg/dL (eGFR 38); labs were stable at this level on 07/28/21.    Subjective    INTERVAL HISTORY:   [facility] as a new patient on 06/08/2023.      Since his last visit, Arthur Young reports:   - He was recently hospitalized from 05/28/23 - 06/01/23 for poor urinary stream and inability to urinate. He had a Foley catheter placed, and was treated with ceftriaxone for suspected UTI, later transitioned to cefpodoxime. He was finally placed on amoxicillin, as his urine culture grew E. faecalis sensitive to ampicillin. He passed a voiding  trial while admitted, and the Foley catheter did not have to be placed again.   - He has been feeling well since discharge; denies hematuria, but urinary stream is slow and he still has hesitancy.   - Appetite is good; no nausea or vomiting, diarrhea, constipation.   - No chest pain or dyspnea.   - He states that he does not want to see Hematology next week.   - The patient reports that he is otherwise well and denies additional medical complaint at this time.    09/07/2023:  -     Patient Active Problem List   Diagnosis    AKI (acute kidney injury)    Benign prostatic hyperplasia with urinary obstruction    Bronchiectasis without complication (Multi-HCC)    Chronic bronchitis (Multi-HCC)    Chronic constipation    Chronic HFrEF (heart failure with reduced ejection fraction) (Multi-HCC)    Chronic venous stasis dermatitis of both lower extremities    Cigarette nicotine dependence without complication    CKD stage G3b/A3, GFR 30-44 and albumin creatinine ratio >300 mg/g (CMS-HCC)    Dysarthria    Enlarged prostate    Gammopathy, monoclonal    Hyperkalemia    Hypermetropia of both eyes    Hypertriglyceridemia     Macrocytosis without anemia    Male erectile disorder    Multiple lung nodules on CT    Paranoid schizophrenia (Multi-HCC)    Polycythemia, secondary    Essential (primary) hypertension     Primary open-angle glaucoma     Pulmonary hypertension (Multi-HCC)    Rectal adenocarcinoma (Multi-HCC)  Schizoaffective disorder (Multi-HCC)    Severe chronic obstructive pulmonary disease (Multi-HCC)    Type 2 diabetes mellitus with diabetic nephropathy, without long-term current use of insulin (Multi-HCC)    Urinary retention    Vitamin D deficiency    Seizure disorder (Multi-HCC)    Acute urinary retention     Current Outpatient Medications   Medication Instructions    albuterol 90 mcg/actuation inhaler 2 puffs, inhalation, Every 6 hours PRN    ammonium lactate (Lac-Hydrin) 12 % lotion SMARTSIG:Application Topical     aspirin 81 mg, oral, Every morning    atorvastatin (LIPITOR) 80 mg, oral, Nightly    benzonatate (TESSALON) 200 mg, oral, Every 8 hours PRN    brimonidine (AlphaGAN P) 0.15 % ophthalmic solution 1 drop, ophthalmic (eye), 3 times daily    carBAMazepine (TEGRETOL) 200 mg, oral, 2 times daily    diclofenac (Voltaren) 1 % topical gel topical (top)    dorzolamide (Trusopt) 2 % ophthalmic solution 1 drop, ophthalmic (eye), 2 times daily    Enulose 10 gram/15 mL solution SMARTSIG:15 Milliliter(s) By Mouth Every Morning    fluticasone-umeclidin-vilanter (Trelegy Ellipta) 100-62.5-25 mcg blister with device 1 Inhalation, inhalation, Daily RT    furosemide (LASIX) 40 mg, oral, Every morning    Lokelma 10 g, oral, Daily    metFORMIN XR (GLUCOPHAGE-XR) 500 mg, oral, 2 times daily    metoprolol succinate XL (Toprol-XL) 50 mg 24 hr tablet TAKE ONE TABLET BY MOUTH ONCE DAILY    mirtazapine (REMERON) 15 mg, oral, Nightly    OLANZapine (ZYPREXA) 10 mg, oral, Nightly    QUEtiapine (SEROQUEL) 400 mg, oral    senna 8.6 mg tablet 2 tablets, oral, 2 times daily    Stool Softener 100 mg, oral, 2 times daily    tamsulosin (FLOMAX) 0.4 mg, oral, Nightly     Allergies   Allergen Reactions    Divalproex      Other reaction(s): 40 lb wt gain  DEPAKOTE 40 lb wt gain--    Lithium      Past Medical History:   Diagnosis Date    CHF (congestive heart failure) (Multi-HCC)     Chronic kidney disease     Hypertension      No past surgical history on file.  Family History   Problem Relation Name Age of Onset    Cancer Neg Hx       Social History     Tobacco Use    Smoking status: Every Day     Current packs/day: 1.00     Average packs/day: 1 pack/day for 35.0 years (35.0 ttl pk-yrs)     Types: Cigarettes    Smokeless tobacco: Never   Substance Use Topics    Alcohol use: Never    Drug use: Never       Objective    PHYSICAL EXAM:   There were no vitals filed for this visit.           Physical Exam  Vitals reviewed.   Constitutional:       General: He is  not in acute distress.     Appearance: He is not ill-appearing.   HENT:      Mouth/Throat:      Comments: Poor dentition.  Cardiovascular:      Rate and Rhythm: Normal rate and regular rhythm.      Pulses: Normal pulses.      Heart sounds: Normal heart sounds.   Pulmonary:      Effort: Pulmonary  effort is normal. No respiratory distress.      Breath sounds: Normal breath sounds.   Musculoskeletal:      Comments: 1+ bilateral lower extremity edema with hyperpigmentation circumferentially of the tibia.    Skin:     General: Skin is warm and dry.   Neurological:      Mental Status: He is alert and oriented to person, place, and time.       LABORATORY DATA:    Labs Ordered Recently:   No visits with results within 1 Week(s) from this visit.   Latest known visit with results is:   Office Visit on 07/22/2023   Component Date Value    Extra Tube 07/22/2023 Hold for add-ons.         The ASCVD Risk score (Arnett DK, et al., 2019) failed to calculate for the following reasons:    Risk score cannot be calculated because patient has a medical history suggesting prior/existing ASCVD    Assessment/Plan    No problem-specific Assessment & Plan notes found for this encounter.

## 2023-09-14 NOTE — Progress Notes (Signed)
 Specialty Pharmacy Disenrollment Summary    Patient is Disenrolled from [facility] as of 09/14/2023 for No data found. Below is a summary of the services provided.    Support & Services    Start Date End Date   Benefits and PA Management 09/25/2021 09/14/2023   No data found 09/25/2021 09/14/2023   No data found 09/25/2021 09/14/2023     Specialty Medication(s)  Arthur Young    Patient's Response to Therapy  Is the patient achieving goals of therapy? N/A    Continuity of Care  Description of ongoing needs that could not be met: Not applicable  Instructions or referral information given to the patient/responsible party: Not applicable    Patient enrolled due to benefits & PA management services. However, now has different insurance from last year and [employer] no longer requires PA.    Disenrolling patient from Specialty Pharmacy services at this time. Team remains available to assist and re-enroll in future if necessary.      Signed  Jewel Mortimer, PharmD  Specialty Pharmacist

## 2023-09-16 ENCOUNTER — Ambulatory Visit: Payer: MEDICARE | Primary: Family Medicine

## 2023-09-16 NOTE — Progress Notes (Unsigned)
 [facility]  [facility]  [address]  [place] [address]  Dept: [phone]  Dept Fax: [phone]      Subjective   Chief complaints:  Urinary retention  BPH  UTI    Subjective    History of present illness  Adult Urology:  Todays visit is facilitated by a *** interpreter #*** (***).    This is a 58 y.o. male with a medical history including but not limited to CKD, HLD, rectal adenocarcinoma, COPD, T2DM, seizure disorder, erectile dysfunction, who is here at the request of Dr. Calla Catchings, MD for Dr. Trecia Friends opinion regarding urinary retention, BPH, and UTI.    - Weak urinary stream c/b urinary retention:   Patient presented to ED on 05/28/2023 with poor urinary stream and inability to urinate on the morning of admission. He also endorsed constipation. He had foley placed.     He passed VT on 05/29/2023. Was started on finasteride on 05/30/2023. He is currently managed on tamsulosin. Finasteride medication expired.     Patient has had one prior episode of urinary retention requiring foley placement in ***.     - UTI: During ED presentation on 05/2023, endorsed dysuria and urgency. UCx with E. Faecalis, treated with amoxicillin.         Results - I have reviewed and interpreted the labs/imaging below:      Labs  UCx:   05/2023: >100,000 CFU/mL E. Faecalis; 10,000-50,000 CFU/mL Mixed bacterial flora    UA:   05/2023: 2+ protein, trace blood, 2+ LE, >100 WBC, 8 RBC    PSA:   01/2020: 2.49    Imaging      The patient has no urgent complaints.   Denies gross hematuria, urethral discharge, pyuria, dysuria, fever, chills, and weight loss.      Problem List[1]    Current Outpatient Medications   Medication Instructions    albuterol 90 mcg/actuation inhaler 2 puffs, inhalation, Every 6 hours PRN    ammonium lactate (Lac-Hydrin) 12 % lotion SMARTSIG:Application Topical    aspirin 81 mg, oral, Every morning    atorvastatin (LIPITOR) 80 mg, oral, Nightly    benzonatate  (TESSALON) 200 mg, oral, Every 8 hours PRN    brimonidine (AlphaGAN P) 0.15 % ophthalmic solution 1 drop, ophthalmic (eye), 3 times daily    carBAMazepine (TEGRETOL) 200 mg, oral, 2 times daily    diclofenac (Voltaren) 1 % topical gel topical (top)    dorzolamide (Trusopt) 2 % ophthalmic solution 1 drop, ophthalmic (eye), 2 times daily    Enulose 10 gram/15 mL solution SMARTSIG:15 Milliliter(s) By Mouth Every Morning    fluticasone-umeclidin-vilanter (Trelegy Ellipta) 100-62.5-25 mcg blister with device 1 Inhalation, inhalation, Daily RT    furosemide (LASIX) 40 mg, oral, Every morning    Lokelma 10 g, oral, Daily    metFORMIN XR (GLUCOPHAGE-XR) 500 mg, oral, 2 times daily    metoprolol succinate XL (Toprol-XL) 50 mg 24 hr tablet TAKE ONE TABLET BY MOUTH ONCE DAILY    mirtazapine (REMERON) 15 mg, oral, Nightly    OLANZapine (ZYPREXA) 10 mg, oral, Nightly    QUEtiapine (SEROQUEL) 400 mg, oral    senna 8.6 mg tablet 2 tablets, oral, 2 times daily    Stool Softener 100 mg, oral, 2 times daily    tamsulosin (FLOMAX) 0.4 mg, oral, Nightly     Allergies[2]  Medical History[3]  Surgical History[4]  Family History[5]  Social History[6]  Review of systems  Urology ROS:   Constitutional: No fevers, chills, weight loss or general weakness.  Head: No headache or dizziness.  Eyes: No loss of vision or double vision.  Ears: No hearing loss, tinnitus, otalgia, otorrhea.  Nose: No nasal discharge.  Throat: No hoarseness or difficulty swallowing.  Integumentary: No rash or skin lesions. No changes in hair or nail character.  Lungs: No shortness of breath. No new, frequent cough.  Cardiovascular: No chest pain or palpitations.  Gastrointestinal: No abdominal pain, nausea, vomiting or change in bowel movements.  Genitourinary: No dysuria or urethral discharge.  Musculoskeletal: No joint/muscle pain or swelling. Normal gait and mobility.  Neurological: No seizures, light headedness, memory loss or numbness.  Psychiatry: No mood or  behavioral changes. No anxiety or depression.  Endocrine: No hirsutism, excessive hair loss/alopecia. No polyuria or polydipsia.  Hematologic/Lymphatic: No easy bruising, bleeding or enlarged lymph nodes.  Allergic/Immunologic: No environmental or food allergies.     There are no discontinued medications.  Outpatient Medications Prior to Visit   Medication Sig Dispense Refill    albuterol 90 mcg/actuation inhaler Inhale 2 puffs every 6 (six) hours if needed for wheezing. 18 g 3    ammonium lactate (Lac-Hydrin) 12 % lotion SMARTSIG:Application Topical      aspirin 81 mg EC tablet Take 81 mg by mouth in the morning.      atorvastatin (Lipitor) 80 mg tablet Take 80 mg by mouth at bedtime.      benzonatate (Tessalon) 100 mg capsule Take 200 mg by mouth every 8 (eight) hours if needed.      brimonidine (AlphaGAN P) 0.15 % ophthalmic solution Administer 1 drop into affected eye(s) three times daily.      carBAMazepine (TEGretol) 200 mg tablet Take 200 mg by mouth twice daily.      diclofenac (Voltaren) 1 % topical gel Apply topically.      dorzolamide (Trusopt) 2 % ophthalmic solution Administer 1 drop into affected eye(s) twice daily.      Enulose 10 gram/15 mL solution SMARTSIG:15 Milliliter(s) By Mouth Every Morning      fluticasone-umeclidin-vilanter (Trelegy Ellipta) 100-62.5-25 mcg blister with device Inhale 1 Inhalation in the morning. 3 each 3    furosemide (Lasix) 40 mg tablet Take 40 mg by mouth in the morning.      Lokelma 5 gram packet Take 10 g by mouth once daily.      metFORMIN XR (Glucophage-XR) 500 mg 24 hr tablet Take 500 mg by mouth twice daily.      metoprolol succinate XL (Toprol-XL) 50 mg 24 hr tablet TAKE ONE TABLET BY MOUTH ONCE DAILY 30 tablet 11    mirtazapine (Remeron) 15 mg tablet Take 15 mg by mouth at bedtime.      OLANZapine (ZyPREXA) 10 mg tablet Take 10 mg by mouth at bedtime.      QUEtiapine (SEROqueL) 400 mg tablet Take 400 mg by mouth.      senna 8.6 mg tablet Take 2 tablets by mouth  twice daily.      Stool Softener 100 mg capsule Take 100 mg by mouth twice daily.      tamsulosin (Flomax) 0.4 mg 24 hr capsule Take 0.4 mg by mouth at bedtime.         Objective :  There were no vitals taken for this visit.    Physical examination   Urology PE:   General Appearance: Well-developed, well nourished, alert and cooperative, NAD, normal secondary sexual characteristics.  Back: Vertebral column aligned no scoliosis or kyphosis. No masses or tenderness.  Abdomen: Soft, benign, non-tender, non-distended, no palpable masses, no hepato-splenomegaly, no hernias. Bladder is not palpable.  Extremities: No clubbing, cyanosis or pitting edema.  Musculo-Skeletal: No muscle wasting, joint swelling or tenderness.  Lymphatic: No cervical, axillary or inguinal adenopathy.  Neurologic: Oriented 3x with no focal deficits.   GU Male:   Genitalia: Normally developed male genitalia.  Phallus: No lesions or chordee.  Glans: No lesions or inflammation.  Meatus: No discharge.  Scrotum: No mass or tenderness. No hernias, hydroceles or varicoceles, no Lymphadenopathy.  Spermatic Cords: No masses, induration or tenderness.  Epididymides: No masses, tenderness or induration.  Testes: Normal size and consistency. No masses, asymmetry, tenderness or atrophy.  Rectal: Normal sphincter tone. No perineal, anal or rectal lesions.  Prostate: ***+ enlarged, smooth, non-tender and symmetrical.  Back: No sacral [place] or dimples. No CVAT or SPT. Bladder is not palpable.         Assessment :  1. LUTS c/b Urinary retention: Patient presented to ED on 05/28/2023 with poor urinary stream and inability to urinate. Found to be in urinary retention. Foley was placed, patient passed VT the following day.     Plan:  - ***      3. UTI: Associated with urinary retention in 05/2023. Reported dysuria and urgency in 05/2023. UCx grew E faecalis. Treated with ampicillin. ***    Plan:  - ***     Dr. Grayling Leach spent *** minutes on this visit this includes time  spent in preparation of the note, and reviewing patient's medical history, labs and imaging for management, treatment, counseling and coordination of care.          [1]   Patient Active Problem List  Diagnosis    AKI (acute kidney injury)    Benign prostatic hyperplasia with urinary obstruction    Bronchiectasis without complication (Multi-HCC)    Chronic bronchitis (Multi-HCC)    Chronic constipation    Chronic HFrEF (heart failure with reduced ejection fraction) (Multi-HCC)    Chronic venous stasis dermatitis of both lower extremities    Cigarette nicotine dependence without complication    CKD stage G3b/A3, GFR 30-44 and albumin creatinine ratio >300 mg/g (CMS-HCC)    Dysarthria    Enlarged prostate    Gammopathy, monoclonal    Hyperkalemia    Hypermetropia of both eyes    Hypertriglyceridemia     Macrocytosis without anemia    Male erectile disorder    Multiple lung nodules on CT    Paranoid schizophrenia (Multi-HCC)    Polycythemia, secondary    Essential (primary) hypertension     Primary open-angle glaucoma     Pulmonary hypertension (Multi-HCC)    Rectal adenocarcinoma (Multi-HCC)    Schizoaffective disorder (Multi-HCC)    Severe chronic obstructive pulmonary disease (Multi-HCC)    Type 2 diabetes mellitus with diabetic nephropathy, without long-term current use of insulin (Multi-HCC)    Urinary retention    Vitamin D deficiency    Seizure disorder (Multi-HCC)    Acute urinary retention   [2]   Allergies  Allergen Reactions    Divalproex      Other reaction(s): 40 lb wt gain  DEPAKOTE 40 lb wt gain--    Lithium    [3]   Past Medical History:  Diagnosis Date    CHF (congestive heart failure) (Multi-HCC)     Chronic kidney disease     Hypertension    [4] No past  surgical history on file.  [5]   Family History  Problem Relation Name Age of Onset    Cancer Neg Hx     [6]   Social History  Tobacco Use    Smoking status: Every Day     Current packs/day: 1.00     Average packs/day: 1 pack/day for 35.0 years (35.0  ttl pk-yrs)     Types: Cigarettes    Smokeless tobacco: Never   Substance Use Topics    Alcohol use: Never    Drug use: Never

## 2023-11-23 ENCOUNTER — Ambulatory Visit: Admit: 2023-11-23 | Discharge: 2023-11-23 | Payer: MEDICARE | Attending: Physician Assistant | Primary: Family Medicine

## 2023-11-23 DIAGNOSIS — N1832 Chronic kidney disease, stage 3b (CMS-HCC): Principal | ICD-10-CM

## 2023-11-23 LAB — BASIC METABOLIC PANEL
Anion Gap: 13 mmol/L (ref 3–14)
BUN: 21 mg/dL (ref 6–24)
Calcium: 9.3 mg/dL (ref 8.5–10.5)
Carbon Dioxide: 26 mmol/L (ref 20–32)
Chloride: 98 mmol/L (ref 98–110)
Creat: 1.99 mg/dL — ABNORMAL HIGH (ref 0.55–1.30)
Glucose: 113 mg/dL (ref 70–139)
Potassium: 4.8 mmol/L (ref 3.6–5.2)
Sodium: 137 mmol/L (ref 135–146)
eGFR: 38 mL/min/1.73m*2 — ABNORMAL LOW (ref >=60)

## 2023-11-23 LAB — CBC W/DIFF
Baso Abs: 0.12 K/uL (ref 0.00–0.22)
Basos: 1 %
Eos Abs: 0.19 K/uL (ref 0.00–0.50)
Eos: 1.6 %
Hct: 47.8 % (ref 37.0–53.0)
Hgb: 15.4 g/dL (ref 13.0–17.5)
Immature Grans Abs: 0.06 K/uL (ref 0.00–0.10)
Immature Granulocytes: 0.5 %
Lymphs Abs: 2.61 K/uL (ref 0.70–4.00)
Lymphs: 22.3 %
MCH: 31.3 pg (ref 26.0–34.0)
MCHC: 32.2 g/dL (ref 31.0–37.0)
MCV: 97.2 fL (ref 80.0–100.0)
Monocytes: 6.2 %
MonocytesAbs: 0.73 K/uL (ref 0.38–0.83)
NRBC: 0 % (ref 0.0–0.0)
Neutrophils Abs: 7.98 K/uL — ABNORMAL HIGH (ref 1.50–7.95)
Neutrophils: 68.4 %
Platelets: 287 K/uL (ref 150–400)
RBC: 4.92 M/uL (ref 4.20–5.90)
RDW: 13.7 % (ref 11.5–14.5)
WBC: 11.7 K/uL — ABNORMAL HIGH (ref 4.0–11.0)

## 2023-11-23 LAB — POCT URINALYSIS DIPSTICK
Bilirubin, UA, POC: NEGATIVE
Blood, UA, POC: NEGATIVE
Glucose, UA, POC: NORMAL
Ketones, UA, POC: NEGATIVE
Leukocytes, UA, POC: NEGATIVE
Nitrite, UA, POC: NEGATIVE
Spec Gravity, UA, POC: <=1.005 (ref 1.001–1.035)
Urobilinogen, UA, POC: NORMAL
pH, UA, POC: 6 (ref 5.0–8.0)

## 2023-11-23 LAB — PROTEIN / CREATININE RATIO, URINE
Creat Ur: 42.4 mg/dL (ref 24.00–392.00)
Protein Total Ur: 150 mg/dL — ABNORMAL HIGH (ref <15.0)
Protein/Creat Ratio: 3538 mg/g{creat} — ABNORMAL HIGH (ref <=200)

## 2023-11-23 LAB — ALBUMIN: Albumin: 4.2 g/dL (ref 3.2–5.0)

## 2023-11-23 LAB — ALBUMIN, URINE, RANDOM (INCLUDING CREATININE)
Alb/Creat Ratio: 2406 mg/g{creat} — ABNORMAL HIGH (ref <=30)
Albumin Ur: 102 mg/dL

## 2023-11-23 LAB — PHOSPHORUS: Phosphorus: 4.2 mg/dL (ref 2.4–4.9)

## 2023-11-23 LAB — MAGNESIUM: Magnesium: 2.3 mg/dL (ref 1.6–2.6)

## 2023-11-23 MED ORDER — sodium zirconium cyclosilicate (Lokelma) 10 gram packet
10 | Freq: Every day | ORAL | 11 refills | 30.00000 days | Status: AC
Start: 2023-11-23 — End: 2024-11-22

## 2023-11-23 NOTE — Progress Notes (Cosign Needed)
 Primary Care Physician: Garnette CHRISTELLA Ferries, MD     PATIENT IDENTIFIER: Arthur Young is a 58 y.o. male with CKD stage g3a/a3 and a past medical history of HFrEF (BiV dysfunction with ejection fraction of 20-25% and severely reduced right ventricular function in 05/2019), COPD (not on home O2), pulmonary nodules, hypertension, pulmonary hypertension, benign prostatic hypertrophy with lower urinary tract symptoms, type 2 diabetes mellitus, and schizoaffective disorder.  He presented as a new patient in the renal clinic on 08/20/20 for discharge follow-up of ATN. He was admitted from 07/04/20-07/16/20 for acute respiratory failure secondary to heart failure and pneumonia. During that admission, he also developed AKI on CKD stage g3a with a peak creatinine of 4.14 mg/dL from his baseline of 1.4 mg/dL. Urinalysis at the time showed 3+ protein (191 spot protein, 123 spot albumin, Protein/Cr 3571 mg/g, Albumin/Cr 2307 mg/g) and thought his CKD is probably related to type 2 diabetes mellitus and hypertension. Nephrology was consulted during his admission for oliguria in the setting of his AKI, and his acute kidney dysfunction was thought to be pre-renal in the setting of cardiorenal syndrome and he developed post-ATN diuresis. He was given Diamox x1 for alkalosis and bicarbonate improved. His kidney function improved and his creatinine on discharge was 2.02 mg/dL (eGFR 38); labs were stable at this level on 07/28/21.    Subjective    INTERVAL HISTORY:   [facility] patient on 06/08/2023.      Since his last visit, Antawan Mchugh reports:   - He saw Dr. Jennine in Hematology on 07/22/23, and she strongly recommended a bone marrow biopsy and fat pad biopsy to evaluate his disease.  Unfortunately, the patient adamantly declined and preferred to be given a medication to treat his issues. She discussed with him that without a confirmatory diagnosis this would not be possible but that it  is likely he has an underlying plasma cell dyscrasia or amyloid deposition. She stated that he would be eligible for the SAVE study ongoing here at [facility] ([facility]) screening for light chain amyloid in patients with MGUS/SMM. Dr. 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  - He denies recent UTI or current urinary issues. He denies gastrointestinal issues; appetite is normal, states that he is vegetarian.   - He denies chest pain, dyspnea; no headaches or dizziness.  - Swelling is improving; denies skin eruptions.   - The patient reports that he is otherwise well and denies additional medical complaint at this time.      Patient Active Problem List   Diagnosis    AKI (acute kidney injury)    Benign prostatic hyperplasia with urinary obstruction    Bronchiectasis without complication (Multi-HCC)    Chronic bronchitis (Multi-HCC)    Chronic constipation    Chronic HFrEF (heart failure with reduced ejection fraction) (Multi-HCC)    Chronic venous stasis dermatitis of both lower extremities    Cigarette nicotine dependence without complication    CKD stage G3b/A3, GFR 30-44 and albumin creatinine ratio >300 mg/g (CMS-HCC)    Dysarthria    Enlarged prostate    Gammopathy, monoclonal    Hyperkalemia    Hypermetropia of both eyes    Hypertriglyceridemia     Macrocytosis without anemia    Male erectile disorder    Multiple lung nodules on CT    Paranoid schizophrenia (Multi-HCC)    Polycythemia, secondary    Essential (primary) hypertension  Primary open-angle glaucoma     Pulmonary hypertension (Multi-HCC)    Rectal adenocarcinoma (Multi-HCC)    Schizoaffective disorder (Multi-HCC)    Severe chronic obstructive pulmonary disease (Multi-HCC)    Type 2 diabetes mellitus with diabetic nephropathy, without long-term current use of insulin  (Multi-HCC)    Urinary retention    Vitamin D  deficiency    Seizure disorder (Multi-HCC)    Acute urinary retention    Iron deficiency anemia     Current  Outpatient Medications   Medication Instructions    albuterol  90 mcg/actuation inhaler 2 puffs, inhalation, Every 6 hours PRN    ammonium lactate (Lac-Hydrin) 12 % lotion SMARTSIG:Application Topical    aspirin  81 mg, Every morning    atorvastatin  (LIPITOR ) 80 mg, Nightly    benzonatate (TESSALON) 200 mg, oral, Every 8 hours PRN    brimonidine (AlphaGAN P) 0.15 % ophthalmic solution 1 drop, 3 times daily    carBAMazepine  (TEGRETOL ) 200 mg, 2 times daily    diclofenac (Voltaren) 1 % topical gel Apply topically.    dorzolamide (Trusopt) 2 % ophthalmic solution 1 drop, 2 times daily    Enulose  10 gram/15 mL solution SMARTSIG:15 Milliliter(s) By Mouth Every Morning    fluticasone-umeclidin-vilanter (Trelegy Ellipta ) 100-62.5-25 mcg blister with device 1 Inhalation, inhalation, Daily RT    furosemide  (LASIX ) 40 mg, Every morning    metFORMIN XR (GLUCOPHAGE-XR) 500 mg, 2 times daily    metoprolol  succinate XL (Toprol -XL) 50 mg 24 hr tablet TAKE ONE TABLET BY MOUTH ONCE DAILY    mirtazapine  (REMERON ) 15 mg, Nightly    OLANZapine  (ZYPREXA ) 10 mg, Nightly    QUEtiapine  (SEROQUEL ) 400 mg    senna 8.6 mg tablet 2 tablets, 2 times daily    sodium zirconium cyclosilicate  (LOKELMA ) 10 g, oral, Daily    Stool Softener 100 mg, 2 times daily    tamsulosin  (FLOMAX ) 0.4 mg, Nightly     Allergies   Allergen Reactions    Divalproex      Other reaction(s): 40 lb wt gain  DEPAKOTE 40 lb wt gain--    Lithium      Past Medical History:   Diagnosis Date    CHF (congestive heart failure) (Multi-HCC)     Chronic kidney disease     Hypertension      No past surgical history on file.  Family History   Problem Relation Name Age of Onset    Cancer Neg Hx       Social History     Tobacco Use    Smoking status: Every Day     Current packs/day: 1.00     Average packs/day: 1 pack/day for 35.0 years (35.0 ttl pk-yrs)     Types: Cigarettes    Smokeless tobacco: Never   Substance Use Topics    Alcohol use: Never    Drug use: Never       Objective     PHYSICAL EXAM:   Vitals:    11/23/23 1327   BP: 111/63   Pulse: 87   Temp: 36.7 C (98.1 F)   TempSrc: Temporal   Weight: 58.5 kg   Height: 1.575 m              Physical Exam  Vitals reviewed.   Constitutional:       General: He is not in acute distress.     Appearance: He is not ill-appearing.   HENT:      Mouth/Throat:      Comments: Poor dentition.  Cardiovascular:      Rate and Rhythm: Normal rate and regular rhythm.      Pulses: Normal pulses.      Heart sounds: Normal heart sounds.   Pulmonary:      Effort: Pulmonary effort is normal. No respiratory distress.      Breath sounds: Normal breath sounds.   Musculoskeletal:      Comments: Trace bilateral lower extremity edema with hyperpigmentation circumferentially of the tibia.    Skin:     General: Skin is warm and dry.   Neurological:      Mental Status: He is alert and oriented to person, place, and time.       LABORATORY DATA:    Labs Ordered Recently:   Office Visit on 11/23/2023   Component Date Value    Fr Kappa Lt Chains 11/23/2023 84.2 (H)     Fr Lambda Lt Chains 11/23/2023 247.3 (H)     Kappa/Lambda Ratio 11/23/2023 0.34     IgA 11/23/2023 586 (H)     IgM 11/23/2023 50     IgG 11/23/2023 713     IgG Subclass 1 11/23/2023 329     IgG Subclass 2 11/23/2023 241     IgG Subclass 3 11/23/2023 38     IgG Subclass 4 11/23/2023 <1 (L)     Glucose 11/23/2023 113     BUN 11/23/2023 21     Creat 11/23/2023 1.99 (H)     eGFR 11/23/2023 38 (L)     Sodium 11/23/2023 137     Potassium 11/23/2023 4.8     Chloride 11/23/2023 98     Carbon Dioxide 11/23/2023 26     Anion Gap 11/23/2023 13     Calcium 11/23/2023 9.3     Creat 11/23/2023 1.97 (H)     eGFR 11/23/2023 38 (L)     Cystatin C 11/23/2023 2.83 (H)     eGFRcr-cys 11/23/2023 26 (L)     eGFRcys 11/23/2023 20 (L)     Albumin 11/23/2023 4.2     Magnesium 11/23/2023 2.3     Phosphorus 11/23/2023 4.2     WBC 11/23/2023 11.7 (H)     RBC 11/23/2023 4.92     Hgb 11/23/2023 15.4     Hct 11/23/2023 47.8     MCV 11/23/2023  97.2     MCH 11/23/2023 31.3     MCHC 11/23/2023 32.2     RDW 11/23/2023 13.7     Platelets 11/23/2023 287     Neutrophils 11/23/2023 68.4     Lymphs 11/23/2023 22.3     Monocytes 11/23/2023 6.2     Eos 11/23/2023 1.6     Basos 11/23/2023 1.0     Neutrophils Abs 11/23/2023 7.98 (H)     Lymphs Abs 11/23/2023 2.61     MonocytesAbs 11/23/2023 0.73     Eos Abs 11/23/2023 0.19     Baso Abs 11/23/2023 0.12     Immature Granulocytes 11/23/2023 0.5     Immature Grans Abs 11/23/2023 0.06     NRBC 11/23/2023 0.0     Albumin Ur 11/23/2023 102.0     Alb/Creat Ratio 11/23/2023 2,406 (H)     Creat Ur 11/23/2023 42.40     Protein Total Ur 11/23/2023 150.0 (H)     Protein/Creat Ratio 11/23/2023 3,538 (H)     Spec Gravity, UA, POC 11/23/2023 <=1.005     pH, UA, POC 11/23/2023 6.0     Leukocytes, UA, POC 11/23/2023 Negative  Nitrite, UA, POC 11/23/2023 Negative     Protein, UA, POC 11/23/2023 2+ (A)     Glucose, UA, POC 11/23/2023 Normal     Ketones, UA, POC 11/23/2023 Negative     Urobilinogen, UA, POC 11/23/2023 Normal     Bilirubin, UA, POC 11/23/2023 Negative     Blood, UA, POC 11/23/2023 Negative         The ASCVD Risk score (Arnett DK, et al., 2019) failed to calculate for the following reasons:    Risk score cannot be calculated because patient has a medical history suggesting prior/existing ASCVD    Assessment/Plan    CKD stage G3b/A3, GFR 30-44 and albumin creatinine ratio >300 mg/g (Multi-HCC)  Historically CKD stage g3a/a3 and a past medical history of HFrEF (BiV dysfunction with ejection fraction of 20-25% and severely reduced right ventricular function in 05/2019), COPD (not on home O2), pulmonary nodules, hypertension, pulmonary hypertension, benign prostatic hypertrophy with lower urinary tract symptoms, type 2 diabetes mellitus, and schizoaffective disorder.     eGFR-creatinine is baseline at 38 ml/min, serum creatinine 1.99 mg/dl, and increased urine albumin/creatinine ratio of 2,406 mg/g (1,355 mg/g at the last  visit). eGFR-cystatin/creatinine is lower at 26 ml/min. There is no anemia, acidosis, or electrolyte abnormality; calcium and phosphorus are normal.    He saw Dr. Jennine in Hematology on 07/22/23, and she strongly recommended a bone marrow biopsy and fat pad biopsy to evaluate his disease.  Unfortunately, the patient adamantly declined and preferred to be given a medication to treat his issues. She discussed with him that without a confirmatory diagnosis this would not be possible but that it is likely he has an underlying plasma cell dyscrasia or amyloid deposition. She stated that he would be eligible for the SAVE study ongoing here at [facility] ([facility]) screening for light chain amyloid in patients with MGUS/SMM. Dr. 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    Essential (primary) hypertension  Blood pressure is controlled without symptoms such as headache, dizziness, lightheadedness, or pre-syncope.    Type 2 diabetes mellitus with diabetic nephropathy, without long-term current use of insulin   Controlled type 2 diabetes mellitus with most recent hemoglobin A1C of 6%.

## 2023-11-24 LAB — CYSTATIN C
Creat: 1.97 mg/dL — ABNORMAL HIGH (ref 0.76–1.27)
Cystatin C: 2.83 mg/L — ABNORMAL HIGH (ref 0.67–1.14)
eGFR: 38 mL/min/1.73 — ABNORMAL LOW (ref >59)
eGFRcr-cys: 26 mL/min/1.73 — ABNORMAL LOW (ref >59)

## 2023-11-24 LAB — IGG, IGA, IGM
IgA: 586 mg/dL — ABNORMAL HIGH (ref 90–386)
IgM: 50 mg/dL (ref 20–172)

## 2023-11-24 LAB — FREE LIGHT CHAINS (KAPPA, LAMBDA)
Fr Kappa Lt Chains: 84.2 mg/L — ABNORMAL HIGH (ref 3.3–19.4)
Fr Lambda Lt Chains: 247.3 mg/L — ABNORMAL HIGH (ref 5.7–26.3)
Kappa/Lambda Ratio: 0.34 (ref 0.26–1.65)

## 2023-11-24 LAB — CYSTATIN C WITH EGFR: eGFRcys: 20 mL/min/1.73 — ABNORMAL LOW (ref >59)

## 2023-11-29 LAB — IMMUNOGLOBULIN IGG SUBCLASSES
IgG Subclass 1: 329 mg/dL (ref 248–810)
IgG Subclass 2: 241 mg/dL (ref 130–555)
IgG Subclass 3: 38 mg/dL (ref 15–102)
IgG Subclass 4: <1 mg/dL — ABNORMAL LOW (ref 2–96)
IgG: 713 mg/dL (ref 603–1613)

## 2023-12-03 NOTE — Assessment & Plan Note (Signed)
 Historically CKD stage g3a/a3 and a past medical history of HFrEF (BiV dysfunction with ejection fraction of 20-25% and severely reduced right ventricular function in 05/2019), COPD (not on home O2), pulmonary nodules, hypertension, pulmonary hypertension, benign prostatic hypertrophy with lower urinary tract symptoms, type 2 diabetes mellitus, and schizoaffective disorder.     eGFR-creatinine is baseline at 38 ml/min, serum creatinine 1.99 mg/dl, and increased urine albumin/creatinine ratio of 2,406 mg/g (1,355 mg/g at the last visit). eGFR-cystatin/creatinine is lower at 26 ml/min. There is no anemia, acidosis, or electrolyte abnormality; calcium and phosphorus are normal.    He saw Dr. Jennine in Hematology on 07/22/23, and she strongly recommended a bone marrow biopsy and fat pad biopsy to evaluate his disease.  Unfortunately, the patient adamantly declined and preferred to be given a medication to treat his issues. She discussed with him that without a confirmatory diagnosis this would not be possible but that it is likely he has an underlying plasma cell dyscrasia or amyloid deposition. She stated that he would be eligible for the SAVE study ongoing here at [facility] ([facility]) screening for light chain amyloid in patients with MGUS/SMM. Dr. 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

## 2023-12-03 NOTE — Assessment & Plan Note (Signed)
 Blood pressure is controlled without symptoms such as headache, dizziness, lightheadedness, or pre-syncope.

## 2023-12-03 NOTE — Assessment & Plan Note (Signed)
Controlled type 2 diabetes mellitus with most recent hemoglobin A1C of 6%.

## 2024-03-16 ENCOUNTER — Ambulatory Visit: Payer: MEDICARE | Attending: Internal Medicine | Primary: Family Medicine

## 2024-04-20 ENCOUNTER — Ambulatory Visit: Payer: MEDICARE | Attending: Internal Medicine | Primary: Family Medicine

## 2024-04-20 NOTE — Progress Notes (Deleted)
 PULMONARY CLINIC NOTE       Today's Date: 04/20/2024  MRN: [id number]  Name: Arthur Young  DOB: [birth year]    Lung nodule, COPD    History Of Present Illness  Arthur Young is a 58 y.o. male with a history of HFrEF, HTN, DM, COPD, and schizoaffective disorder (paranoid) presenting to follow-up COPD and lung nodules.    At prior visits I obtained a CT chest which showed a persistent 1.9 cm nodule/region of atelectasis in the left lower lobe. Repeat CT chest from 02/2021 shows resolution of nodule with replacement with scarring/resisual atelectasis.     At his last visit I recommended screening LDCT, TTE and smoking cessation, all of which he declined.     Throughout his visit he perseverated on his 'body being harmed' in the past, but he is now safe.     At his last visit he declined CT chest and TTE.     He is adherent to trelegy, has not needed his rescue inhaler much. Walks a day. No cough, sputum production, chest pain or wheezing. No pedal edema. Has intentionally lost weight through diet modification. He continues to report people are trying to harm him, consistent with his delusions in the past.     He continues to smoke 1PPD, he is not interested in quitting.      Intubations: None    Prednisone course: Last in 2021       Past Medical History  HFrREF EF 30%   COPD   Lung nodule   Bronchiectasis.      Surgical History  No past surgical history on file.      Social History  1PPD smoking history   No alcohol or drug use   Living at group home     Family History   Patient declined to provied     Allergies  None     Medications  Current Outpatient Medications   Medication Instructions    albuterol  90 mcg/actuation inhaler 2 puffs, inhalation, Every 6 hours PRN    ammonium lactate (Lac-Hydrin) 12 % lotion SMARTSIG:Application Topical    aspirin  81 mg, Every morning    atorvastatin  (LIPITOR ) 80 mg, Nightly    benzonatate (TESSALON) 200 mg, oral, Every 8 hours PRN    brimonidine (AlphaGAN P) 0.15 % ophthalmic  solution 1 drop, 3 times daily    carBAMazepine  (TEGRETOL ) 200 mg, 2 times daily    diclofenac (Voltaren) 1 % topical gel Apply topically.    dorzolamide (Trusopt) 2 % ophthalmic solution 1 drop, 2 times daily    Enulose  10 gram/15 mL solution SMARTSIG:15 Milliliter(s) By Mouth Every Morning    fluticasone -umeclidin-vilanter (Trelegy Ellipta ) 100-62.5-25 mcg blister with device 1 Inhalation, inhalation, Daily RT    furosemide  (LASIX ) 40 mg, Every morning    metFORMIN XR (GLUCOPHAGE-XR) 500 mg, 2 times daily    metoprolol  succinate XL (Toprol -XL) 50 mg 24 hr tablet TAKE ONE TABLET BY MOUTH ONCE DAILY    mirtazapine  (REMERON ) 15 mg, Nightly    OLANZapine  (ZYPREXA ) 10 mg, Nightly    QUEtiapine  (SEROQUEL ) 400 mg    senna 8.6 mg tablet 2 tablets, 2 times daily    sodium zirconium cyclosilicate  (LOKELMA ) 10 g, oral, Daily    Stool Softener 100 mg, 2 times daily    tamsulosin  (FLOMAX ) 0.4 mg, Nightly         ROS:  Constitutional: no fever/chills or weight loss  Eyes: no loss of vision/blurred vision  HENT: no nosebleeds  Respiratory: as per HPI  Cardiovascular: no chest pain or palpitations  Gastrointestinal: no GERD, diarrhea or constipation  Musculoskeletal: no arthralgias or myalgias  Neurologic: no headaches or dizziness  Psychological: no anxiety or depression  Skin: no easy bruising or rash    Physical Exam:    There were no vitals taken for this visit.    General: Alert and oriented x3, no apparent distress  Oropharynx: no cobblestoning, no erythema or PND  Nasal Mucosa: moist, pink, normal turbinates  Neck: no JVD, no palpable LAD, no stridor  Lungs: no wheezes, rhonchi or rales, no accessory muscle use  Heart: normal s1/s2, regular rate and rythym  Gastrointestinal: non tender, non-distended, + bowel sounds  Vascular: palpable pulses, warm extremities  Extremities: no edema or clubbing  Skin: no rashes or lesions       Relevant Results  CXR: Left lower lobe infiltrates, Stable cardiomegaly and right lateral  chest  wall pleural thickening associated with multiple old rib fractures.      PFTs: very severe obstruction with BD responsiveness      TTE: EF 30%, The tissue Doppler diastolic parameters are indeterminate. There  is severe global hypokinesis of the left ventricle. Flattened septum is  consistent with RV pressure overload, Right ventricle is mild to moderately  dilated with moderately reduced function. TAPSE is 1.0cm. TDI S' is 7.7cm/s.  The right atrium is moderately dilated. The inter-atrial septum bows toward  the left    atrium consistent with high right atrial pressure. A dilated inferior vena  cava suggests increased right atrial pressure. The lack of respiratory  variation in the inferior vena cava diameter is noted.      CT chest 5/21: LLL 1.9cm nocule vs atelectasis    CT chest 09/08/19: Apically predominant centrilobular emphysema and diffuse  mild patchy groundglass opacity, consistent with chronic airspace disease.    2\. Multifocal tree-in-bud nodularity with several foci of groundglass opacity  in the lateral left upper lobe, likely representing a inflammatory or  infectious process. Short interval follow-up to resolution or stability with  repeat CT of the chest in 3-6 months is recommended.    3\. Multiple sub-6 mm solid and subsolid pulmonary nodules. Many of these  likely reflect mucous impaction/inflammatory changes, though attention on  follow-up imaging is recommended.    4\. Moderate bronchiectasis confined to the left lower lobe, with associated  nodular scarring versus atelectasis, likely reflecting sequelae of prior  aspiration/inflammation.       CT chest 02/2021:       Assessment/Plan     Problem List Items Addressed This Visit    None      Arthur Young is a 58 y.o. male with a history of HFrEF, HTN, DM, COPD, and schizoaffective disorder (paranoid) presenting to follow-up COPD and lung nodules.     #COPD: Gold stage 4/D, currently with well-controlled symptoms and no COPD exacerbations  this year. Recommend continuing Trelegy inhaler and albuterol  as needed.  He appears to be doing reaasonably well despite severe COPD. I had previously offered pulm rehab however he was not interested.  He is not hypoxemic at rest on room air and therefore does not qualify for home O2.  He is not interested in quitting smoking, counseled extensively. Not interested in LDCT, risks explained.     #Lung nodule: Noted to have a 1.9 cm persistent lung nodule in the left lower lobe versus atelectasis which has resolved with residual scarring/atelectasis supporting the latter. Counseled re lung  cancer screening however he declined this today again.      #Pulmonary hypertension: Likely due to [facility] group 2/3.  He has declined obtaining an echo at today's visit.     #Current smoker: Counseled extensively regarding smoking cessation however patient is not interested in quitting.    #Bronchiectasis: check ANA, RF, SSA/SSB, HIV and immunoglob panel--he has declined any blood work. Will re-assess at next visit.     Received flu and covid booster.     RTC in 1 year.     Mylinda Rafter, MD

## 2024-05-30 ENCOUNTER — Ambulatory Visit: Admit: 2024-05-30 | Discharge: 2024-05-30 | Payer: MEDICARE | Attending: Physician Assistant | Primary: Family Medicine

## 2024-05-30 DIAGNOSIS — N1832 Chronic kidney disease, stage 3b: Secondary | ICD-10-CM

## 2024-05-30 LAB — PROTEIN / CREATININE RATIO, URINE
Creat Ur: 27.5 mg/dL (ref 24.00–392.00)
Protein/Creat Ratio: 5564 mg/g{creat} — ABNORMAL HIGH (ref <=200)

## 2024-05-30 LAB — POCT URINALYSIS DIPSTICK
Bilirubin, UA, POC: NEGATIVE
Blood, UA, POC: NEGATIVE
Glucose, UA, POC: NORMAL
Ketones, UA, POC: NEGATIVE
Leukocytes, UA, POC: NEGATIVE
Nitrite, UA, POC: NEGATIVE
Spec Gravity, UA, POC: <=1.005 (ref 1.001–1.035)
Urobilinogen, UA, POC: NORMAL
pH, UA, POC: 7 (ref 5.0–8.0)

## 2024-05-30 LAB — ALBUMIN, URINE, RANDOM (INCLUDING CREATININE)
Alb/Creat Ratio: 3745 mg/g{creat} — ABNORMAL HIGH (ref <=30)
Albumin Ur: 103 mg/dL

## 2024-05-30 MED ORDER — SODIUM ZIRCONIUM CYCLOSILICATE 10 GRAM ORAL POWDER PACKET
10 | Freq: Every day | ORAL | 11 refills | Status: AC
Start: 2024-05-30 — End: 2025-05-30

## 2024-05-30 NOTE — Progress Notes (Signed)
 Primary Care Physician: Garnette CHRISTELLA Ferries, MD     PATIENT IDENTIFIER: Arthur Young is a 59 y.o. male with CKD stage g3a/a3 and a past medical history of HFrEF (BiV dysfunction with ejection fraction of 20-25% and severely reduced right ventricular function in 05/2019), COPD (not on home O2), pulmonary nodules, hypertension, pulmonary hypertension, benign prostatic hypertrophy with lower urinary tract symptoms, type 2 diabetes mellitus, and schizoaffective disorder.  He presented as a new patient in the renal clinic on 08/20/20 for discharge follow-up of ATN. He was admitted from 07/04/20-07/16/20 for acute respiratory failure secondary to heart failure and pneumonia. During that admission, he also developed AKI on CKD stage g3a with a peak creatinine of 4.14 mg/dL from his baseline of 1.4 mg/dL. Urinalysis at the time showed 3+ protein (191 spot protein, 123 spot albumin, Protein/Cr 3571 mg/g, Albumin/Cr 2307 mg/g) and thought his CKD is probably related to type 2 diabetes mellitus and hypertension. Nephrology was consulted during his admission for oliguria in the setting of his AKI, and his acute kidney dysfunction was thought to be pre-renal in the setting of cardiorenal syndrome and he developed post-ATN diuresis. He was given Diamox x1 for alkalosis and bicarbonate improved. His kidney function improved and his creatinine on discharge was 2.02 mg/dL (eGFR 38); labs were stable at this level on 07/28/21.    Subjective    INTERVAL HISTORY:   [facility].      Since his last visit, Rawn Quiroa reports:   - He saw Dr. Jennine in Hematology on 07/22/23, and she strongly recommended a bone marrow biopsy and fat pad biopsy to evaluate his disease.  Unfortunately, the patient adamantly declined and preferred to be given a medication to treat his issues. She discussed with him that without a confirmatory diagnosis this would not be possible but that it is likely he has  an underlying plasma cell dyscrasia or amyloid deposition. She stated that he would be eligible for the SAVE study ongoing here at [facility] ([facility]) screening for light chain amyloid in patients with MGUS/SMM. Dr. 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  - He denies recent illness or hospitalization.  - He reports increased frequency of urination, no hematuria or dysuria. Nocturia x1-2, stream is normal.  - He reports persistent constipation, for which he uses stool softeners with moderate effect. He has a bowel movement once every 2-3 days.   - Fatigue in the morning, improves during the day.   - He denies recent UTI or current urinary issues. He denies gastrointestinal issues; appetite is normal, states that he is vegetarian.   - He denies chest pain, dyspnea; no headaches or dizziness.  - Swelling is improving; denies skin eruptions.   - The patient reports that he is otherwise well and denies additional medical complaint at this time.      Patient Active Problem List   Diagnosis    AKI (acute kidney injury)    Benign prostatic hyperplasia with urinary obstruction    Bronchiectasis without complication    Chronic bronchitis    Chronic constipation    Chronic HFrEF (heart failure with reduced ejection fraction)    Chronic venous stasis dermatitis of both lower extremities    Cigarette nicotine dependence without complication    CKD stage G3b/A3, GFR 30-44 and albumin creatinine ratio >300 mg/g    Dysarthria    Enlarged prostate    Gammopathy, monoclonal  Hyperkalemia    Hypermetropia of both eyes    Hypertriglyceridemia    Macrocytosis without anemia    Male erectile disorder    Multiple lung nodules on CT    Paranoid schizophrenia    Polycythemia, secondary    Essential (primary) hypertension    Primary open-angle glaucoma    Pulmonary hypertension    Rectal adenocarcinoma    Schizoaffective disorder    Severe chronic obstructive pulmonary disease    Type 2 diabetes mellitus with  diabetic nephropathy, without long-term current use of insulin     Urinary retention    Vitamin D  deficiency    Seizure disorder    Acute urinary retention    Iron deficiency anemia     Current Outpatient Medications   Medication Instructions    albuterol  90 mcg/actuation inhaler 2 puffs, inhalation, Every 6 hours PRN    aspirin  81 mg, Every morning    atorvastatin  (LIPITOR ) 80 mg, Nightly    brimonidine (AlphaGAN P) 0.15 % ophthalmic solution 1 drop, 3 times daily    carBAMazepine  (TEGRETOL ) 200 mg, 2 times daily    dorzolamide (Trusopt) 2 % ophthalmic solution 1 drop, 2 times daily    Enulose  10 gram/15 mL solution SMARTSIG:15 Milliliter(s) By Mouth Every Morning    finasteride  (PROSCAR ) 5 mg, Daily    fluticasone -umeclidin-vilanter (Trelegy Ellipta ) 100-62.5-25 mcg blister with device 1 Inhalation, inhalation, Daily RT    furosemide  (LASIX ) 40 mg, Every morning    metFORMIN XR (GLUCOPHAGE-XR) 500 mg, 2 times daily    metoprolol  succinate XL (Toprol -XL) 50 mg 24 hr tablet TAKE ONE TABLET BY MOUTH ONCE DAILY    mirtazapine  (REMERON ) 15 mg, Nightly    OLANZapine  (ZYPREXA ) 10 mg, Nightly    QUEtiapine  (SEROQUEL ) 400 mg    senna 8.6 mg tablet 2 tablets, 2 times daily    sodium zirconium cyclosilicate  (LOKELMA ) 10 g, oral, Daily    Stool Softener 100 mg, 2 times daily    tamsulosin  (FLOMAX ) 0.4 mg, Nightly     Allergies   Allergen Reactions    Divalproex      Other reaction(s): 40 lb wt gain  DEPAKOTE 40 lb wt gain--    Lithium      Past Medical History:   Diagnosis Date    CHF (congestive heart failure) (Multi-HCC)     Chronic kidney disease     Hypertension      No past surgical history on file.  Family History   Problem Relation Name Age of Onset    Cancer Neg Hx       Social History     Tobacco Use    Smoking status: Every Day     Current packs/day: 1.00     Average packs/day: 1 pack/day for 35.0 years (35.0 ttl pk-yrs)     Types: Cigarettes    Smokeless tobacco: Never   Substance Use Topics    Alcohol use: Never     Drug use: Never       Objective    PHYSICAL EXAM:   Vitals:    05/30/24 1308   BP: (!) 147/75   Pulse: 84   Weight: 68 kg         Physical Exam  Vitals reviewed.   Constitutional:       General: He is not in acute distress.     Appearance: He is not ill-appearing.   HENT:      Mouth/Throat:      Comments: Poor dentition.  Cardiovascular:  Rate and Rhythm: Normal rate and regular rhythm.      Pulses: Normal pulses.      Heart sounds: Normal heart sounds.   Pulmonary:      Effort: Pulmonary effort is normal. No respiratory distress.      Breath sounds: Normal breath sounds.   Musculoskeletal:      Comments: Trace bilateral lower extremity edema with hyperpigmentation circumferentially of the tibia.    Skin:     General: Skin is warm and dry.   Neurological:      Mental Status: He is alert and oriented to person, place, and time.       LABORATORY DATA:    Labs Ordered Recently:   Orders Only on 05/30/2024   Component Date Value    Protein Total Ur 05/30/2024 155.8     Albumin Ur 05/30/2024 CANCELED     Alpha-1-Glob Ur 05/30/2024 CANCELED     Alpha-2-Glob Ur 05/30/2024 CANCELED     Beta Glob Urine 05/30/2024 CANCELED     Gamma Glob Ur 05/30/2024 CANCELED     M-Spike % Ur 05/30/2024 CANCELED     Note PE Ur 05/30/2024     Office Visit on 05/30/2024   Component Date Value    Glucose 05/30/2024 101 (H)     BUN 05/30/2024 18     Creat 05/30/2024 1.84 (H)     eGFR 05/30/2024 41 (L)     BUN/Creat Ratio 05/30/2024 10     Sodium 05/30/2024 144     Potassium 05/30/2024 5.1     Chloride 05/30/2024 106     Carbon Dioxide 05/30/2024 28     Anion Gap 05/30/2024 10.0     Calcium 05/30/2024 8.9     Phosphorus 05/30/2024 5.4 (H)     Albumin 05/30/2024 4.0     Magnesium 05/30/2024 1.9     WBC 05/30/2024 9.4     RBC 05/30/2024 4.88     Hgb 05/30/2024 15.3     Hct 05/30/2024 48.1     MCV 05/30/2024 99 (H)     MCH 05/30/2024 31.4     MCHC 05/30/2024 31.8     RDW 05/30/2024 15.1     Platelets 05/30/2024 245     Neutrophils 05/30/2024 60      Lymphs 05/30/2024 27     Monocytes 05/30/2024 8     Eos 05/30/2024 3     Basos 05/30/2024 1     Neutrophils Abs 05/30/2024 5.7     Lymphs Abs 05/30/2024 2.6     MonocytesAbs 05/30/2024 0.7     Eos Abs 05/30/2024 0.3     Baso Abs 05/30/2024 0.1     Immature Granulocytes 05/30/2024 1     Immature Grans Abs 05/30/2024 0.1     Albumin Ur 05/30/2024 103.0     Alb/Creat Ratio 05/30/2024 3,745 (H)     Creat Ur 05/30/2024 27.50     Protein/Creat Ratio 05/30/2024 5,564 (H)     Spec Gravity, UA, POC 05/30/2024 <=1.005     pH, UA, POC 05/30/2024 7.0     Leukocytes, UA, POC 05/30/2024 Negative     Nitrite, UA, POC 05/30/2024 Negative     Protein, UA, POC 05/30/2024 2+ (A)     Glucose, UA, POC 05/30/2024 Normal     Ketones, UA, POC 05/30/2024 Negative     Urobilinogen, UA, POC 05/30/2024 Normal     Bilirubin, UA, POC 05/30/2024 Negative     Blood, UA, POC 05/30/2024 Negative  PTH, Intact 05/30/2024 223 (H)     HgbA1C 05/30/2024 6.2 (H)     Protein Total 05/30/2024 6.3     Albumin 05/30/2024 3.2     Alpha1 Glob 05/30/2024 0.2     Alpha2 Glob 05/30/2024 0.9     Beta Glob 05/30/2024 1.1     Gamma Glob 05/30/2024 0.8     MSpike 05/30/2024 0.3 (H)     Glob Total 05/30/2024 3.1     A/G Ratio 05/30/2024 1.0     PE Note 05/30/2024      Fr Kappa Lt Chains 05/30/2024 72.8 (H)     Fr Lambda Lt Chains 05/30/2024 212.6 (H)     Kappa/Lambda Ratio 05/30/2024 0.34     IFE Result 05/30/2024  (A)     IgG 05/30/2024 642     IgA 05/30/2024 524 (H)     IgM 05/30/2024 40         The ASCVD Risk score (Arnett DK, et al., 2019) failed to calculate for the following reasons:    Risk score cannot be calculated because patient has a medical history suggesting prior/existing ASCVD    * - Cholesterol units were assumed    Assessment/Plan    CKD stage G3b/A3, GFR 30-44 and albumin creatinine ratio >300 mg/g (Multi-HCC)  Historically CKD stage g3b/a3 and a past medical history of HFrEF (BiV dysfunction with ejection fraction of 20-25% and severely  reduced right ventricular function in 05/2019), COPD (not on home O2), pulmonary nodules, hypertension, pulmonary hypertension, benign prostatic hypertrophy with lower urinary tract symptoms, type 2 diabetes mellitus, and schizoaffective disorder.     eGFR-creatinine is baseline at 41 ml/min, serum creatinine 1.84 mg/dl, and increased urine albumin/creatinine ratio of 3,795 mg/g (2,406 mg/g at the last visit). There is no anemia, acidosis, or electrolyte abnormality; calcium and phosphorus are normal.    He saw Dr. Jennine in Hematology on 07/22/23, and she strongly recommended a bone marrow biopsy and fat pad biopsy to evaluate his disease. Unfortunately, the patient adamantly declined and preferred to be given a medication to treat his issues. She discussed with him that without a confirmatory diagnosis this would not be possible but that it is likely he has an underlying plasma cell dyscrasia or amyloid deposition. She stated that he would be eligible for the SAVE study ongoing here at [facility] ([facility]) screening for light chain amyloid in patients with MGUS/SMM. Dr. 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 His labs today show persistent M-spike at 0.3.    Essential (primary) hypertension  Blood pressure is elevated without symptoms such as headache, dizziness, lightheadedness, or pre-syncope. He is hesitant to start any new medications, and states that he uses his own techniques, which were not well explained, to help control his blood pressure.     Type 2 diabetes mellitus with diabetic nephropathy, without long-term current use of insulin   Controlled type 2 diabetes mellitus with most recent hemoglobin A1C of 6.2%.    Benign prostatic hyperplasia with urinary obstruction  He denies recent UTI or current urinary issues.

## 2024-05-31 LAB — PROTEIN ELECTROPHORESIS, URINE: Protein Total Ur: 155.8 mg/dL

## 2024-05-31 LAB — CBC W/DIFF
Baso Abs: 0.1 x10E3/uL (ref 0.0–0.2)
Basos: 1 %
Eos Abs: 0.3 x10E3/uL (ref 0.0–0.4)
Eos: 3 %
Hct: 48.1 % (ref 37.5–51.0)
Hgb: 15.3 g/dL (ref 13.0–17.7)
Immature Grans Abs: 0.1 x10E3/uL (ref 0.0–0.1)
Immature Granulocytes: 1 %
Lymphs Abs: 2.6 x10E3/uL (ref 0.7–3.1)
Lymphs: 27 %
MCH: 31.4 pg (ref 26.6–33.0)
MCHC: 31.8 g/dL (ref 31.5–35.7)
MCV: 99 fL — ABNORMAL HIGH (ref 79–97)
Monocytes: 8 %
MonocytesAbs: 0.7 x10E3/uL (ref 0.1–0.9)
Neutrophils Abs: 5.7 x10E3/uL (ref 1.4–7.0)
Neutrophils: 60 %
Platelets: 245 x10E3/uL (ref 150–450)
RBC: 4.88 x10E6/uL (ref 4.14–5.80)
RDW: 15.1 % (ref 11.6–15.4)
WBC: 9.4 x10E3/uL (ref 3.4–10.8)

## 2024-05-31 LAB — PTH, INTACT: PTH, Intact: 223 pg/mL — ABNORMAL HIGH (ref 15–65)

## 2024-05-31 LAB — FREE LIGHT CHAINS (KAPPA, LAMBDA)
Fr Kappa Lt Chains: 72.8 mg/L — ABNORMAL HIGH (ref 3.3–19.4)
Fr Lambda Lt Chains: 212.6 mg/L — ABNORMAL HIGH (ref 5.7–26.3)
Kappa/Lambda Ratio: 0.34 (ref 0.26–1.65)

## 2024-05-31 LAB — RENAL FUNCTION PANEL
Albumin: 4 g/dL (ref 3.8–4.9)
Anion Gap: 10 mmol/L (ref 10.0–18.0)
BUN/Creat Ratio: 10 (ref 10–24)
BUN: 18 mg/dL (ref 8–27)
Calcium: 8.9 mg/dL (ref 8.6–10.2)
Carbon Dioxide: 28 mmol/L (ref 20–29)
Chloride: 106 mmol/L (ref 96–106)
Creat: 1.84 mg/dL — ABNORMAL HIGH (ref 0.76–1.27)
Glucose: 101 mg/dL — ABNORMAL HIGH (ref 70–99)
Phosphorus: 5.4 mg/dL — ABNORMAL HIGH (ref 2.8–4.1)
Potassium: 5.1 mmol/L (ref 3.5–5.2)
Sodium: 144 mmol/L (ref 134–144)
eGFR: 41 mL/min/1.73 — ABNORMAL LOW (ref >59)

## 2024-05-31 LAB — MAGNESIUM: Magnesium: 1.9 mg/dL (ref 1.6–2.3)

## 2024-06-01 LAB — PROTEIN ELECTROPHORESIS, SERUM
A/G Ratio: 1 (ref 0.7–1.7)
Albumin: 3.2 g/dL (ref 2.9–4.4)
Alpha1 Glob: 0.2 g/dL (ref 0.0–0.4)
Alpha2 Glob: 0.9 g/dL (ref 0.4–1.0)
Beta Glob: 1.1 g/dL (ref 0.7–1.3)
Gamma Glob: 0.8 g/dL (ref 0.4–1.8)
Glob Total: 3.1 g/dL (ref 2.2–3.9)
MSpike: 0.3 g/dL — ABNORMAL HIGH
Protein Total: 6.3 g/dL (ref 6.0–8.5)

## 2024-06-01 LAB — HEMOGLOBIN A1C: HgbA1C: 6.2 % — ABNORMAL HIGH (ref 4.8–5.6)

## 2024-06-03 LAB — IMMUNOFIXATION ELECTROPHORESIS, SERUM
IgA: 524 mg/dL — ABNORMAL HIGH (ref 90–386)
IgG: 642 mg/dL (ref 603–1613)
IgM: 40 mg/dL (ref 20–172)

## 2024-06-06 NOTE — Assessment & Plan Note (Signed)
 He denies recent UTI or current urinary issues.

## 2024-06-06 NOTE — Assessment & Plan Note (Addendum)
 Historically CKD stage g3b/a3 and a past medical history of HFrEF (BiV dysfunction with ejection fraction of 20-25% and severely reduced right ventricular function in 05/2019), COPD (not on home O2), pulmonary nodules, hypertension, pulmonary hypertension, benign prostatic hypertrophy with lower urinary tract symptoms, type 2 diabetes mellitus, and schizoaffective disorder.     eGFR-creatinine is baseline at 41 ml/min, serum creatinine 1.84 mg/dl, and increased urine albumin/creatinine ratio of 3,795 mg/g (2,406 mg/g at the last visit). There is no anemia, acidosis, or electrolyte abnormality; calcium and phosphorus are normal.    He saw Dr. Jennine in Hematology on 07/22/23, and she strongly recommended a bone marrow biopsy and fat pad biopsy to evaluate his disease. Unfortunately, the patient adamantly declined and preferred to be given a medication to treat his issues. She discussed with him that without a confirmatory diagnosis this would not be possible but that it is likely he has an underlying plasma cell dyscrasia or amyloid deposition. She stated that he would be eligible for the SAVE study ongoing here at [facility] ([facility]) screening for light chain amyloid in patients with MGUS/SMM. Dr. 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 His labs today show persistent M-spike at 0.3.

## 2024-06-06 NOTE — Assessment & Plan Note (Signed)
 Blood pressure is elevated without symptoms such as headache, dizziness, lightheadedness, or pre-syncope. He is hesitant to start any new medications, and states that he uses his own techniques, which were not well explained, to help control his blood pressure.

## 2024-06-06 NOTE — Assessment & Plan Note (Signed)
 Controlled type 2 diabetes mellitus with most recent hemoglobin A1C of 6.2%.
